# Patient Record
Sex: Female | Born: 1945 | Race: White | Hispanic: No | Marital: Married | Smoking: Never smoker
Health system: Southern US, Community
[De-identification: ages and names within clinical notes are randomized; demographics above are authoritative.]

## PROBLEM LIST (undated history)

## (undated) VITALS — BP 148/79 | HR 102 | Temp 98.1°F | Resp 18 | Ht 64.0 in | Wt 126.0 lb

## (undated) DIAGNOSIS — E7800 Pure hypercholesterolemia, unspecified: Secondary | ICD-10-CM

## (undated) DIAGNOSIS — E785 Hyperlipidemia, unspecified: Secondary | ICD-10-CM

## (undated) DIAGNOSIS — R011 Cardiac murmur, unspecified: Secondary | ICD-10-CM

## (undated) DIAGNOSIS — F329 Major depressive disorder, single episode, unspecified: Secondary | ICD-10-CM

## (undated) DIAGNOSIS — I10 Essential (primary) hypertension: Secondary | ICD-10-CM

## (undated) HISTORY — DX: Pure hypercholesterolemia, unspecified: E78.00

## (undated) HISTORY — DX: Cardiac murmur, unspecified: R01.1

## (undated) HISTORY — DX: Major depressive disorder, single episode, unspecified: F32.9

---

## 1978-12-11 HISTORY — PX: ABDOMINAL HYSTERECTOMY: SHX81

## 1998-12-11 DIAGNOSIS — F32A Depression, unspecified: Secondary | ICD-10-CM

## 1998-12-11 HISTORY — DX: Depression, unspecified: F32.A

## 2004-10-10 ENCOUNTER — Ambulatory Visit: Payer: Self-pay | Admitting: Internal Medicine

## 2005-12-07 ENCOUNTER — Ambulatory Visit: Payer: Self-pay | Admitting: Internal Medicine

## 2006-05-17 ENCOUNTER — Ambulatory Visit: Payer: Self-pay | Admitting: Unknown Physician Specialty

## 2007-01-24 ENCOUNTER — Ambulatory Visit: Payer: Self-pay | Admitting: Internal Medicine

## 2007-04-25 ENCOUNTER — Ambulatory Visit: Payer: Self-pay | Admitting: Gastroenterology

## 2008-01-28 ENCOUNTER — Ambulatory Visit: Payer: Self-pay | Admitting: Internal Medicine

## 2009-02-08 ENCOUNTER — Ambulatory Visit: Payer: Self-pay | Admitting: Internal Medicine

## 2010-02-14 ENCOUNTER — Ambulatory Visit: Payer: Self-pay | Admitting: Internal Medicine

## 2011-03-15 ENCOUNTER — Ambulatory Visit: Payer: Self-pay | Admitting: Internal Medicine

## 2012-04-03 ENCOUNTER — Ambulatory Visit: Payer: Self-pay | Admitting: Internal Medicine

## 2012-08-20 ENCOUNTER — Ambulatory Visit: Payer: Self-pay | Admitting: Gastroenterology

## 2012-09-12 ENCOUNTER — Telehealth: Payer: Self-pay | Admitting: Internal Medicine

## 2012-09-12 NOTE — Telephone Encounter (Signed)
Cell phone # [phone] Pt called stated she has a history of depression and she thinks she is going through this again She had a colonoscopy 9/10 since then she has not be feeling right Headaches no energy no interest. She wanted to know if you could recommend a therapist for her to go to She is being treated for sinus infection now

## 2012-09-12 NOTE — Telephone Encounter (Signed)
I refer to [facility]).  Let me know if I need to see her - can work her in next week.

## 2012-09-17 NOTE — Telephone Encounter (Signed)
Left message on machine at home for patient to return call. 

## 2012-09-24 NOTE — Telephone Encounter (Signed)
Called pt again on 09/24/12 to inform her of Dr. Roby Lofts referral to [employer]. Pt stated that she is already seeing a therapist that she contacted herself.

## 2012-10-07 ENCOUNTER — Telehealth: Payer: Self-pay | Admitting: Internal Medicine

## 2012-10-07 NOTE — Telephone Encounter (Signed)
Patient having depression ,headaches and anxiety and wants a sooner appointment.

## 2012-10-07 NOTE — Telephone Encounter (Signed)
Spoke with patient via telephone, she stated that she is already on medication for depression and anxiety but her main concern was the neck and shoulder pain.  She was ok the an appt on 10/09/2012, Erie Noe will schedule appt.

## 2012-10-07 NOTE — Telephone Encounter (Signed)
I can see her Wednesday 10/09/12 at 12:00 - work in for this.  Make sure pt comfortable with Wednesday appt.  Thanks.

## 2012-10-09 ENCOUNTER — Encounter: Payer: Self-pay | Admitting: Internal Medicine

## 2012-10-09 ENCOUNTER — Ambulatory Visit (INDEPENDENT_AMBULATORY_CARE_PROVIDER_SITE_OTHER): Payer: BC Managed Care – PPO | Admitting: Internal Medicine

## 2012-10-09 VITALS — BP 138/82 | HR 91 | Temp 97.8°F | Resp 18 | Ht 64.0 in | Wt 128.0 lb

## 2012-10-09 DIAGNOSIS — M542 Cervicalgia: Secondary | ICD-10-CM

## 2012-10-09 DIAGNOSIS — F329 Major depressive disorder, single episode, unspecified: Secondary | ICD-10-CM

## 2012-10-09 DIAGNOSIS — R5381 Other malaise: Secondary | ICD-10-CM

## 2012-10-09 DIAGNOSIS — R5383 Other fatigue: Secondary | ICD-10-CM

## 2012-10-09 DIAGNOSIS — E7800 Pure hypercholesterolemia, unspecified: Secondary | ICD-10-CM

## 2012-10-09 NOTE — Patient Instructions (Signed)
It was good seeing you today.  I am sorry you have not been feeling well.  I am going to refer you to physical therapy - for your neck.  Let me know if you need anything more.

## 2012-10-10 ENCOUNTER — Encounter: Payer: Self-pay | Admitting: Internal Medicine

## 2012-10-10 DIAGNOSIS — F329 Major depressive disorder, single episode, unspecified: Secondary | ICD-10-CM | POA: Insufficient documentation

## 2012-10-10 DIAGNOSIS — E7800 Pure hypercholesterolemia, unspecified: Secondary | ICD-10-CM | POA: Insufficient documentation

## 2012-10-10 DIAGNOSIS — F320 Major depressive disorder, single episode, mild: Secondary | ICD-10-CM | POA: Insufficient documentation

## 2012-10-10 NOTE — Assessment & Plan Note (Signed)
Back on Wellbutrin, Xanax and Ambien.  Sleeping better.  Energy has improved some.  No suicidal ideations. Continue to follow up with Dr Neale Burly.

## 2012-10-10 NOTE — Assessment & Plan Note (Signed)
On simvastatin.  Follow lipid panel and liver function.    

## 2012-10-10 NOTE — Progress Notes (Signed)
  Subjective:    Patient ID: Angelica Robertson, female    DOB: May 02, 1946, 66 y.o.   MRN: [id number]  HPI 66 year old female with past history of depression and hypercholesterolemia who comes in today as a work in with concerns regarding increased tension and neck discomfort.  States she had a colonoscopy 08/20/12.  One week after the colonoscopy, she developed headache and decreased energy.  She had been exercising regularly.  When this started, she noticed more fatigue and decreased appetite.  Hard to go to the grocery store or cook.  Lost interest in doing things.  Had problems with depression previously and stated she could recognized the symptoms.  She made herself go to the [facility] and exercise.  Subsequently developed increased tension in her neck which led to the headaches.  She experienced this previously - with the depression.  She contacted her psychiatrist (Dr Neale Burly) and started on Wellbutrin on 09/12/12.  Recently increased Xanax to bid dosing.  She also has ambien to help her sleep.  She does have more energy now.  Still having to make herself do things and is making herself eat.  She is sleeping better.  Still with the neck tension.    Past Medical History  Diagnosis Date  . Depression 2000  . Hypercholesterolemia     Review of Systems Patient denies any lightheadedness or dizziness.  Does report the headache - which she attributes to the muscle tension.  No sinus symptoms.  No chest pain, tightness or palpatations.  No increased shortness of breath, cough or congestion.  No nausea or vomiting.  No abdominal pain or cramping.  No bowel change, such as diarrhea, constipation, BRBPR or melana.  No urine change.        Objective:   Physical Exam Filed Vitals:   10/09/12 1157  BP: 138/82  Pulse: 91  Temp: 97.8 F (36.6 C)  Resp: 79   66 year old female in no acute distress.   HEENT:  Nares - clear.  OP- without lesions or erythema.  NECK:  Supple, nontender.  No audible bruit.  No  significant pain with rotation of her head.  Some minimal pulling sensation with looking to the left and right.   HEART:  Appears to be regular. LUNGS:  Without crackles or wheezing audible.  Respirations even and unlabored.   RADIAL PULSE:  Equal bilaterally.  ABDOMEN:  Soft, nontender.  No audible abdominal bruit.   EXTREMITIES:  No increased edema to be present.  No pain with rotation of her shoulders.                   Assessment & Plan:  NECK PAIN WITH ASSOCIATED HEADACHE.  She is sleeping better now.  Describes the pain/discomfort as being the same as she experienced before - with her depression.  Discussed at length with her today.  Will hold on xray and medications.  Will refer her to physical therapy for evaluation and treatment.  Follow.   FATIGUE.  Check cbc, met c and tsh to confirm no metabolic etiology.    HEALTH MAINTENANCE.  Mammogram - 03/2012.  Colonoscopy 08/20/12.

## 2012-10-15 ENCOUNTER — Ambulatory Visit (INDEPENDENT_AMBULATORY_CARE_PROVIDER_SITE_OTHER): Payer: BC Managed Care – PPO | Admitting: Internal Medicine

## 2012-10-15 ENCOUNTER — Encounter: Payer: Self-pay | Admitting: Internal Medicine

## 2012-10-15 ENCOUNTER — Telehealth: Payer: Self-pay | Admitting: Internal Medicine

## 2012-10-15 VITALS — BP 138/98 | HR 96 | Temp 97.8°F | Ht 64.0 in | Wt 126.0 lb

## 2012-10-15 DIAGNOSIS — F329 Major depressive disorder, single episode, unspecified: Secondary | ICD-10-CM

## 2012-10-15 DIAGNOSIS — R109 Unspecified abdominal pain: Secondary | ICD-10-CM

## 2012-10-15 DIAGNOSIS — E7800 Pure hypercholesterolemia, unspecified: Secondary | ICD-10-CM

## 2012-10-15 DIAGNOSIS — R0602 Shortness of breath: Secondary | ICD-10-CM

## 2012-10-15 DIAGNOSIS — R079 Chest pain, unspecified: Secondary | ICD-10-CM

## 2012-10-15 NOTE — Telephone Encounter (Signed)
Caller: [name]/Patient; Patient Name: Angelica Robertson; PCP: Dale Eugenio Saenz; Best Callback Phone Number: [phone].  Patient calling about pain under R breast which radiating into the back.  States occurred 10/14/12 2030, and lasted about 3 minutes.  States she had a similar episode 4 weeks ago.  Afebrile.  Per abdominal pain protocol, emergent symptoms denied; advised appt within 2 weeks.  Appt scheduled 10/15/12 1000 with Dr. Lorin Picket.

## 2012-10-15 NOTE — Patient Instructions (Addendum)
I am sorry you have not been feeling well.  We will schedule an abdominal ultrasound and stress test.  We will call you with an appt time.  Let me know if you have any problems.

## 2012-10-16 ENCOUNTER — Other Ambulatory Visit (INDEPENDENT_AMBULATORY_CARE_PROVIDER_SITE_OTHER): Payer: BC Managed Care – PPO

## 2012-10-16 DIAGNOSIS — R5383 Other fatigue: Secondary | ICD-10-CM

## 2012-10-16 DIAGNOSIS — E7800 Pure hypercholesterolemia, unspecified: Secondary | ICD-10-CM

## 2012-10-16 LAB — LIPID PANEL
Cholesterol: 167 mg/dL (ref 0–200)
Total CHOL/HDL Ratio: 3
Triglycerides: 140 mg/dL (ref 0.0–149.0)

## 2012-10-16 LAB — COMPREHENSIVE METABOLIC PANEL
BUN: 15 mg/dL (ref 6–23)
CO2: 31 mEq/L (ref 19–32)
Calcium: 10.1 mg/dL (ref 8.4–10.5)
Chloride: 100 mEq/L (ref 96–112)
Creatinine, Ser: 0.8 mg/dL (ref 0.4–1.2)
GFR: 78.98 mL/min (ref >60.00)
Glucose, Bld: 105 mg/dL — ABNORMAL HIGH (ref 70–99)

## 2012-10-16 LAB — CBC WITH DIFFERENTIAL/PLATELET
Basophils Relative: 0.5 % (ref 0.0–3.0)
HCT: 41.7 % (ref 36.0–46.0)
Hemoglobin: 13.8 g/dL (ref 12.0–15.0)
Lymphocytes Relative: 19.8 % (ref 12.0–46.0)
Lymphs Abs: 1.9 10*3/uL (ref 0.7–4.0)
MCHC: 33.1 g/dL (ref 30.0–36.0)
Monocytes Relative: 7.4 % (ref 3.0–12.0)
Neutro Abs: 6.7 10*3/uL (ref 1.4–7.7)
RBC: 4.57 Mil/uL (ref 3.87–5.11)

## 2012-10-16 LAB — TSH: TSH: 1.14 u[IU]/mL (ref 0.35–5.50)

## 2012-10-18 ENCOUNTER — Telehealth: Payer: Self-pay | Admitting: *Deleted

## 2012-10-20 NOTE — Progress Notes (Signed)
  Subjective:    Patient ID: Angelica Robertson, female    DOB: [birth year], 66 y.o.   MRN: [id number]  HPI 66 year old female with past history of depression and hypercholesterolemia who comes in today as a work in with concerns regarding epigastric and RUQ pain as well as back pain.  Describes a pressure/pulling sensation.  Had an episode 4-5 weeks ago - in bed.  Took TUMS.  Resolved.  With this episode, she had eaten approximately two hours prior.  Ate grilled chicken and green beans.  Episode lasted several minutes.  No bowel change.  Has not had another episode since last night.  No vomiting.  No acid reflux.  She has been having more trouble with depression lately.  See last note for details.  Recently had not been able to exercise as much as she previously could.  States she would get winded and feel more fatigued.  This has improved some, but she is still not able to exercise - "like she was".  No actual chest pain.    Past Medical History  Diagnosis Date  . Depression 2000  . Hypercholesterolemia      Review of Systems Patient denies any headache, lightheadedness or dizziness.  No chest pain, tightness or palpitations. Describes the fatigue and windedness with exercise - as outlined above.  No nausea or vomiting.  Abdominal pain as outlined.  No bowel change, such as diarrhea, constipation, BRBPR or melana.  No urine change.        Objective:   Physical Exam Filed Vitals:   10/15/12 1051  BP: 138/98  Pulse: 96  Temp: 97.8 F (36.6 C)   Blood pressure recheck:  26/52  66 year old female in no acute distress.   HEENT:  Nares - clear.  OP- without lesions or erythema.  NECK:  Supple, nontender.  No audible bruit.   HEART:  Appears to be regular. LUNGS:  Without crackles or wheezing audible.  Respirations even and unlabored.   RADIAL PULSE:  Equal bilaterally.  ABDOMEN:  Soft, nontender.  No audible abdominal bruit. \ BACK:  No back pain or CVA tenderness.    EXTREMITIES:  No increased  edema to be present.                     Assessment & Plan:  CARDIOVASCULAR.  EKG obtained today and revealed SR with no acute ischemic changes.  Given the windedness with exertion and increased fatigue with exercise and given risk factors - will obtain stress echo to confirm no active ischemia.  Continue risk factor modification.   ABDOMINAL PAIN.  Unclear as to the exact etiology.  Will obtain cbc, liver panel and amylase/lipase.  Check abdominal ultrasound.  Follow.  She will notify me or be reevaluated if symptoms change or worsen.    ELEVATED BLOOD PRESSURE.  Has been under good control.  Elevated today.  Follow.

## 2012-10-20 NOTE — Assessment & Plan Note (Signed)
Back on Wellbutrin and Remeron now.  Follow.  Continue to follow up with psychiatry.

## 2012-10-20 NOTE — Assessment & Plan Note (Signed)
On simvastatin.  Check lipid panel and liver function.   

## 2012-10-21 ENCOUNTER — Ambulatory Visit: Payer: Self-pay | Admitting: Internal Medicine

## 2012-10-23 NOTE — Telephone Encounter (Signed)
Patient notified of test results 

## 2012-10-25 ENCOUNTER — Telehealth: Payer: Self-pay | Admitting: Internal Medicine

## 2012-10-25 NOTE — Telephone Encounter (Signed)
Pt was notified 10/22/12 of her abdominal ultrasound results.  Small cysts in the left hepatic lobe but no other acute hepatic abnormality.  Gallbladder normal.  No common bile duct dilation.  Spleen, pancreas and kidneys are normal.

## 2012-10-25 NOTE — Telephone Encounter (Signed)
Received call from Dr Brita Romp (pts psychiatrist) -  [phone] office and [phone] cell) regarding pt.  Was inquiring about stress test.  Explained did not have results yet, but would notify her as soon as I received results.  Discussed Ms Eick.  She is in the process of adjusting her meds.  She has had some suicidal thoughts.  Dr Neale Burly does not feel she needs hospitalization at this time.  Is following closely.

## 2012-10-29 ENCOUNTER — Telehealth: Payer: Self-pay | Admitting: Internal Medicine

## 2012-10-29 NOTE — Telephone Encounter (Signed)
Please notify ms Shank that her stress test looks good.  Ok.

## 2012-10-29 NOTE — Telephone Encounter (Signed)
Called and gave lab results to patient.  

## 2012-11-11 ENCOUNTER — Encounter: Payer: Self-pay | Admitting: Internal Medicine

## 2012-11-12 ENCOUNTER — Encounter: Payer: Self-pay | Admitting: Internal Medicine

## 2012-11-12 ENCOUNTER — Ambulatory Visit (INDEPENDENT_AMBULATORY_CARE_PROVIDER_SITE_OTHER): Payer: BC Managed Care – PPO | Admitting: Internal Medicine

## 2012-11-12 VITALS — BP 149/90 | HR 107 | Temp 98.4°F | Ht 64.0 in | Wt 127.0 lb

## 2012-11-12 DIAGNOSIS — F329 Major depressive disorder, single episode, unspecified: Secondary | ICD-10-CM

## 2012-11-12 DIAGNOSIS — F3289 Other specified depressive episodes: Secondary | ICD-10-CM

## 2012-11-12 NOTE — Progress Notes (Signed)
Subjective:    Patient ID: Angelica Robertson, female    DOB: 02-20-1946, 66 y.o.   MRN: [id number]  HPI  66 year old female with past history of depression and hypercholesterolemia who comes in today as a work in with concerns regarding increased blood pressure and headache.  States she had a colonoscopy 08/20/12.  One week after the colonoscopy, she developed headache and decreased energy.  She has had problems since that time with increased depression.  Seeing Dr Neale Burly regularly.  She came in today to discuss her blood pressure and headaches.  Blood pressure readings have been averaging 128-139/70s.  She has neck issues and feels this is contributing to her headache.  She stopped going to physical therapy.  She is now seeing a [facility].  Headache is better now.  Still with decreased appetite, but she is making herself eat.  States she only sleeps six hours per night.  Due to see Dr Neale Burly this pm.  She does report having some increased chest pressure at times.  Also has noticed increased heart rate.  No significant sob.  No nausea or vomiting.       Past Medical History  Diagnosis Date  . Depression 2000  . Hypercholesterolemia     Review of Systems Patient denies any lightheadedness or dizziness.  Does report the headache - which she attributes to the muscle tension.  Better now.   No sinus symptoms.  Does report the increased heart rate intermittently (mostly 104-119 - when it occurs).   No increased shortness of breath, cough or congestion.  No nausea or vomiting.  Decreased appetite, but she is making herself eat.  No abdominal pain or cramping.  No bowel change, such as diarrhea, constipation, BRBPR or melana.  No urine change.   No longer exercising.      Objective:   Physical Exam  Filed Vitals:   11/12/12 0948  BP: 149/90  Pulse: 107  Temp: 98.4 F (36.9 C)   Blood pressure recheck:  68/72  66 year old female in no acute distress.   HEENT:  Nares - clear.  OP- without lesions  or erythema.  NECK:  Supple, nontender.  No audible bruit.  No significant pain with rotation of her head.  Better rom.  HEART:  Appears to be regular. LUNGS:  Without crackles or wheezing audible.  Respirations even and unlabored.   RADIAL PULSE:  Equal bilaterally.  ABDOMEN:  Soft, nontender.  No audible abdominal bruit.   EXTREMITIES:  No increased edema to be present.  No pain with rotation of her shoulders.                   Assessment & Plan:  NECK PAIN WITH ASSOCIATED HEADACHE.  Appears to be better.  Has Flexeril if needed.  Hold on any further testing at this time.    CARDIOVASCULAR.  Recent stress test negative for ischemia.  She is having intermittent chest tightness and increased heart rate.  Scares her when this happens.  We discussed her recent negative stress test.  Discussed the anxiety and how I felt this was contributing to her symptoms.  She is no longer exercising.  She is seeing psychiatry and meds are being adjusted - to obtain better control of her anxiety.  She is extremely anxious about this.  We discussed referral to cardiology to get another opinion regarding the need for further w/up.  I think this would help reassure her - if they agreed no further  cardiac w/up warranted.  She wants to hold on referral at this point, but will let me know if she changes her mind.    HEALTH MAINTENANCE.  Mammogram - 03/2012.  Colonoscopy 08/20/12.

## 2012-11-12 NOTE — Assessment & Plan Note (Addendum)
Seeing Dr Neale Burly regularly.  Medications are being adjusted.  She is due to see her this pm.  No suicidal ideations currently.  They have discussed the possibility of admission.  We discussed this today as well.  She will continue close follow up with Dr Neale Burly.  I spent over 25 minutes with the patient - with over 50% of the time spent in consultation regarding the above.

## 2012-11-13 ENCOUNTER — Telehealth: Payer: Self-pay | Admitting: Internal Medicine

## 2012-11-13 ENCOUNTER — Encounter: Payer: Self-pay | Admitting: Internal Medicine

## 2012-11-13 DIAGNOSIS — R079 Chest pain, unspecified: Secondary | ICD-10-CM

## 2012-11-13 NOTE — Telephone Encounter (Signed)
Pt has an apt. On 12/6 @ 945am. Pt is aware.

## 2012-11-13 NOTE — Telephone Encounter (Signed)
Pt needs referral to cardiology for evaluation of intermittent chest pain and tightness.  Thanks.

## 2012-11-13 NOTE — Telephone Encounter (Signed)
Pt is calling and saying she would like to see a cardiologist.

## 2012-11-15 ENCOUNTER — Ambulatory Visit (INDEPENDENT_AMBULATORY_CARE_PROVIDER_SITE_OTHER): Payer: BC Managed Care – PPO | Admitting: Cardiovascular Disease

## 2012-11-15 ENCOUNTER — Encounter: Payer: Self-pay | Admitting: Cardiovascular Disease

## 2012-11-15 VITALS — BP 154/92 | HR 102 | Ht 63.0 in | Wt 121.5 lb

## 2012-11-15 DIAGNOSIS — F329 Major depressive disorder, single episode, unspecified: Secondary | ICD-10-CM

## 2012-11-15 DIAGNOSIS — E7800 Pure hypercholesterolemia, unspecified: Secondary | ICD-10-CM

## 2012-11-15 DIAGNOSIS — R0789 Other chest pain: Secondary | ICD-10-CM

## 2012-11-15 DIAGNOSIS — I498 Other specified cardiac arrhythmias: Secondary | ICD-10-CM

## 2012-11-15 DIAGNOSIS — R079 Chest pain, unspecified: Secondary | ICD-10-CM

## 2012-11-15 DIAGNOSIS — R000 Tachycardia, unspecified: Secondary | ICD-10-CM

## 2012-11-15 NOTE — Assessment & Plan Note (Signed)
Chest pain is atypical in nature. Given previous exercise tolerance, recent negative stress test, no further workup needed. We have tried to reassure her. She has no significant risk factors for coronary artery disease. Cholesterol is well controlled.

## 2012-11-15 NOTE — Assessment & Plan Note (Signed)
Complemented her on her excellent cholesterol level and suggested she stay on her cholesterol medication.

## 2012-11-15 NOTE — Assessment & Plan Note (Signed)
Sinus tachycardia noted during her stress test and on today's visit. Possibly secondary to anxiety. She does have palpitations and baseline fast heart rate on her self-evaluation at home. We have suggested she try bystolic 5 mg daily in the morning, possibly with slow titration up to 10 mg daily for symptom relief from tachycardia and palpitations. I suspect her anxiety/depression could be contributing to her tachycardia. Perhaps when this improves, she can stop the beta blocker. We did mention that she could take a beta blocker on a when necessary basis as well.

## 2012-11-15 NOTE — Assessment & Plan Note (Signed)
Be managed by Dr. Lorin Picket and Dr. Neale Burly at [facility].

## 2012-11-15 NOTE — Patient Instructions (Addendum)
Please start bystolic 5 mg once a day (ok to take as needed) For worsening symptoms, take two Call the office if symptoms persist   Please call us if you have new issues that need to be addressed before your next appt.   One month follow up

## 2012-11-15 NOTE — Progress Notes (Signed)
Patient ID: Angelica Robertson, female    DOB: 1946-08-10, 66 y.o.   MRN: [id number]  HPI Comments: Ms. Angelica Robertson is a very pleasant 66 year old woman with previous history of depression, patient of Dr. Lorin Picket, who presents by referral for symptoms of chest discomfort.   She reports that she had a recent stress test, echo stress test where she achieved heart rate 158 beats per minute, no wall motion abnormality by her report. She was told her stress test was normal. Details of the study were obtained. Stress test 10/24/2012. She exercised for close to 10 minutes, achieved close to 11 mets, peak heart rate 169 beats per minute, baseline heart rate 104 beats per minute. Peak blood pressure 187/94.  She used to work out at [employer] 4 days per week. Since the middle of September, she has felt very depressed and anxious. She has quit the [facility]. She sees psychiatry at [facility], Dr. Neale Burly and was started on cervical in the past month. She has not had any significant improvement in her symptoms.  She reports rare episodes of chest pain, possibly 3-4 episodes over the past several months. She is otherwise able to exert herself without any symptoms. She did have one episode of pressure in her abdomen after eating. She reports that her heart rate is typically very fast. She does appreciate palpitations.  EKG today shows sinus tachycardia with rate 102 beats per minute with no significant ST or T wave changes     Outpatient Encounter Prescriptions as of 11/15/2012  Medication Sig Dispense Refill  . ALPRAZolam (XANAX) 0.5 MG tablet Take 0.5 mg by mouth 2 (two) times daily as needed.       Marland Kitchen buPROPion (WELLBUTRIN XL) 300 MG 24 hr tablet Take 300 mg by mouth daily.      . cyclobenzaprine (FLEXERIL) 10 MG tablet Take 10 mg by mouth as needed.      Marland Kitchen QUEtiapine (SEROQUEL) 25 MG tablet Take 25 mg by mouth at bedtime.      . ranitidine (ZANTAC) 150 MG tablet Take 150 mg by mouth 2 (two) times daily.      . simvastatin  (ZOCOR) 20 MG tablet Take 20 mg by mouth daily.      Marland Kitchen zolpidem (AMBIEN CR) 12.5 MG CR tablet Take 12.5 mg by mouth at bedtime as needed.      . [DISCONTINUED] buPROPion (WELLBUTRIN SR) 200 MG 12 hr tablet Take 200 mg by mouth daily.      . [DISCONTINUED] mirtazapine (REMERON) 15 MG tablet Take 15 mg by mouth at bedtime.         Review of Systems  Constitutional: Negative.   HENT: Negative.   Eyes: Negative.   Respiratory: Negative.   Cardiovascular: Positive for palpitations.  Gastrointestinal: Negative.   Musculoskeletal: Negative.   Skin: Negative.   Neurological: Negative.   Hematological: Negative.   Psychiatric/Behavioral: Positive for dysphoric mood and decreased concentration.  All other systems reviewed and are negative.    BP 154/92  Pulse 102  Ht 5\' 3"  (1.6 m)  Wt 121 lb 8 oz (55.112 kg)  BMI 21.52 kg/m2  Physical Exam  Nursing note and vitals reviewed. Constitutional: She is oriented to person, place, and time. She appears well-developed and well-nourished.  HENT:  Head: Normocephalic.  Nose: Nose normal.  Mouth/Throat: Oropharynx is clear and moist.  Eyes: Conjunctivae normal are normal. Pupils are equal, round, and reactive to light.  Neck: Normal range of motion. Neck supple. No JVD  present.  Cardiovascular: Regular rhythm, S1 normal, S2 normal, normal heart sounds and intact distal pulses.  Tachycardia present.  Exam reveals no gallop and no friction rub.   No murmur heard. Pulmonary/Chest: Effort normal and breath sounds normal. No respiratory distress. She has no wheezes. She has no rales. She exhibits no tenderness.  Abdominal: Soft. Bowel sounds are normal. She exhibits no distension. There is no tenderness.  Musculoskeletal: Normal range of motion. She exhibits no edema and no tenderness.  Lymphadenopathy:    She has no cervical adenopathy.  Neurological: She is alert and oriented to person, place, and time. Coordination normal.  Skin: Skin is warm  and dry. No rash noted. No erythema.  Psychiatric: She has a normal mood and affect. Her behavior is normal. Judgment and thought content normal.         Assessment and Plan

## 2012-11-19 ENCOUNTER — Ambulatory Visit: Payer: Self-pay | Admitting: Internal Medicine

## 2012-11-26 ENCOUNTER — Ambulatory Visit (INDEPENDENT_AMBULATORY_CARE_PROVIDER_SITE_OTHER): Payer: BC Managed Care – PPO | Admitting: Internal Medicine

## 2012-11-26 ENCOUNTER — Encounter: Payer: Self-pay | Admitting: Internal Medicine

## 2012-11-26 VITALS — BP 148/80 | HR 92 | Temp 98.2°F | Ht 64.0 in | Wt 126.5 lb

## 2012-11-26 DIAGNOSIS — F329 Major depressive disorder, single episode, unspecified: Secondary | ICD-10-CM

## 2012-11-26 DIAGNOSIS — F3289 Other specified depressive episodes: Secondary | ICD-10-CM

## 2012-11-26 MED ORDER — PANTOPRAZOLE SODIUM 40 MG PO TBEC: 40.0000 mg | DELAYED_RELEASE_TABLET | Freq: Every day | ORAL | Status: DC

## 2012-12-01 ENCOUNTER — Encounter: Payer: Self-pay | Admitting: Internal Medicine

## 2012-12-01 NOTE — Progress Notes (Signed)
Subjective:    Patient ID: Angelica Robertson, female    DOB: [birth year], 66 y.o.   MRN: [id number]  Sore Throat   66 year old female with past history of depression and hypercholesterolemia who comes in today as a work in with concerns regarding some increased gi issues.  States she had a colonoscopy 08/20/12.  One week after the colonoscopy, she developed headache and decreased energy.  She has had problems since that time with increased depression.  Seeing Dr Neale Burly.  No significant sob.  No nausea or vomiting.  States she feels full.  Minimal acid reflux.  Was taking Zantac, but she stopped the last couple of days.  Feels bloated.  Bowel movements - not regular.  No abdominal pain.  She is forcing herself to eat.  No dysphagia.    Past Medical History  Diagnosis Date  . Depression 2000  . Hypercholesterolemia   . Heart murmur     Current Outpatient Prescriptions on File Prior to Visit  Medication Sig Dispense Refill  . ALPRAZolam (XANAX) 0.5 MG tablet Take 0.5 mg by mouth 2 (two) times daily as needed.       Marland Kitchen buPROPion (WELLBUTRIN XL) 300 MG 24 hr tablet Take 300 mg by mouth daily.      . cyclobenzaprine (FLEXERIL) 10 MG tablet Take 10 mg by mouth as needed.      Marland Kitchen QUEtiapine (SEROQUEL) 25 MG tablet Take 25 mg by mouth at bedtime.      . ranitidine (ZANTAC) 150 MG tablet Take 150 mg by mouth 2 (two) times daily.      . simvastatin (ZOCOR) 20 MG tablet Take 20 mg by mouth daily.      Marland Kitchen zolpidem (AMBIEN CR) 12.5 MG CR tablet Take 12.5 mg by mouth at bedtime as needed.      . pantoprazole (PROTONIX) 40 MG tablet Take 1 tablet (40 mg total) by mouth daily.  30 tablet  2    Review of Systems Patient denies any lightheadedness or dizziness.  Does report the headache - which she attributes to the muscle tension.  Better now.   No sinus symptoms.  No increased shortness of breath, cough or congestion.  Increased acid reflux (minimal).  No nausea or vomiting.  Decreased appetite, but she is making  herself eat.  No abdominal pain or cramping.  Some bloating.  No bowel change, such as diarrhea, BRBPR or melana.  She does report her bowels not as regular.  No urine change.       Objective:   Physical Exam  Filed Vitals:   11/26/12 1446  BP: 148/80  Pulse: 92  Temp: 98.2 F (45.37 C)   66 year old female in no acute distress.   HEENT:  Nares - clear.  OP- without lesions or erythema.  NECK:  Supple, nontender.  No audible bruit.  HEART:  Appears to be regular. LUNGS:  Without crackles or wheezing audible.  Respirations even and unlabored.   RADIAL PULSE:  Equal bilaterally.  ABDOMEN:  Soft, nontender.  No audible abdominal bruit.   EXTREMITIES:  No increased edema to be present.                   Assessment & Plan:  GI.  Symptoms as outlined.  Will start her on Protonix.  Follow closely.  Discussed pelvic ultrasound.  She declines.  Will follow.  Get her back in soon to reassess.    CARDIOVASCULAR.  Recent stress test negative  for ischemia.  Symptoms stable now.  Saw cardiology.  Recommended no further w/up.  Follow.    HEALTH MAINTENANCE.  Mammogram - 03/2012.  Colonoscopy 08/20/12.

## 2012-12-01 NOTE — Assessment & Plan Note (Signed)
Still seeing Dr Neale Burly.  Symptoms improved.  Follow.

## 2012-12-09 ENCOUNTER — Encounter: Payer: Self-pay | Admitting: Internal Medicine

## 2012-12-09 ENCOUNTER — Inpatient Hospital Stay (HOSPITAL_COMMUNITY)
Admission: EM | Admit: 2012-12-09 | Discharge: 2012-12-16 | DRG: 430 | Disposition: A | Discharge status: Home / Self Care | Payer: BC Managed Care – PPO | Source: Ambulatory Visit | Attending: Psychiatry | Admitting: Psychiatry

## 2012-12-09 ENCOUNTER — Telehealth: Payer: Self-pay | Admitting: Internal Medicine

## 2012-12-09 ENCOUNTER — Encounter (HOSPITAL_COMMUNITY): Payer: Self-pay | Admitting: *Deleted

## 2012-12-09 DIAGNOSIS — F419 Anxiety disorder, unspecified: Secondary | ICD-10-CM

## 2012-12-09 DIAGNOSIS — R0789 Other chest pain: Secondary | ICD-10-CM

## 2012-12-09 DIAGNOSIS — F411 Generalized anxiety disorder: Secondary | ICD-10-CM | POA: Diagnosis present

## 2012-12-09 DIAGNOSIS — E7800 Pure hypercholesterolemia, unspecified: Secondary | ICD-10-CM

## 2012-12-09 DIAGNOSIS — Z79899 Other long term (current) drug therapy: Secondary | ICD-10-CM

## 2012-12-09 DIAGNOSIS — F332 Major depressive disorder, recurrent severe without psychotic features: Principal | ICD-10-CM | POA: Diagnosis present

## 2012-12-09 DIAGNOSIS — R000 Tachycardia, unspecified: Secondary | ICD-10-CM

## 2012-12-09 DIAGNOSIS — F329 Major depressive disorder, single episode, unspecified: Secondary | ICD-10-CM

## 2012-12-09 MED ORDER — BUPROPION HCL ER (XL) 300 MG PO TB24
300.0000 mg | ORAL_TABLET | Freq: Every day | ORAL | Status: DC
  Administered 2012-12-10: 300 mg via ORAL
  Filled 2012-12-09 (×2): qty 1

## 2012-12-09 MED ORDER — ADULT MULTIVITAMIN W/MINERALS CH
1.0000 | ORAL_TABLET | Freq: Every day | ORAL | Status: DC
Start: 2012-12-10 — End: 2012-12-10
  Administered 2012-12-10: 1 via ORAL
  Filled 2012-12-09 (×3): qty 1

## 2012-12-09 MED ORDER — SIMVASTATIN 20 MG PO TABS
20.0000 mg | ORAL_TABLET | Freq: Every day | ORAL | Status: DC
  Administered 2012-12-10 – 2012-12-15 (×6): 20 mg via ORAL
  Filled 2012-12-09 (×8): qty 1

## 2012-12-09 MED ORDER — CYCLOBENZAPRINE HCL 10 MG PO TABS
10.0000 mg | ORAL_TABLET | Freq: Three times a day (TID) | ORAL | Status: DC | PRN
  Administered 2012-12-14: 10 mg via ORAL
  Filled 2012-12-09: qty 1

## 2012-12-09 MED ORDER — MAGNESIUM HYDROXIDE 400 MG/5ML PO SUSP: 30.0000 mL | Freq: Every day | ORAL | Status: DC | PRN

## 2012-12-09 MED ORDER — PANTOPRAZOLE SODIUM 40 MG PO TBEC
40.0000 mg | DELAYED_RELEASE_TABLET | Freq: Every day | ORAL | Status: DC
  Administered 2012-12-10: 40 mg via ORAL
  Filled 2012-12-09 (×4): qty 1

## 2012-12-09 MED ORDER — LORAZEPAM 1 MG PO TABS
1.0000 mg | ORAL_TABLET | Freq: Once | ORAL | Status: AC
  Administered 2012-12-10: 1 mg via ORAL

## 2012-12-09 MED ORDER — ZOLPIDEM TARTRATE 5 MG PO TABS
5.0000 mg | ORAL_TABLET | Freq: Every evening | ORAL | Status: DC | PRN
  Administered 2012-12-09: 5 mg via ORAL
  Filled 2012-12-09: qty 1

## 2012-12-09 MED ORDER — ALUM & MAG HYDROXIDE-SIMETH 200-200-20 MG/5ML PO SUSP: 30.0000 mL | ORAL | Status: DC | PRN

## 2012-12-09 MED ORDER — CALCIUM CARBONATE-VITAMIN D 500-200 MG-UNIT PO TABS
1.0000 | ORAL_TABLET | Freq: Every day | ORAL | Status: DC
  Administered 2012-12-09 – 2012-12-10 (×2): 1 via ORAL
  Filled 2012-12-09 (×5): qty 1

## 2012-12-09 MED ORDER — ACETAMINOPHEN 325 MG PO TABS
650.0000 mg | ORAL_TABLET | Freq: Four times a day (QID) | ORAL | Status: DC | PRN
  Administered 2012-12-12 – 2012-12-14 (×2): 650 mg via ORAL

## 2012-12-09 NOTE — Telephone Encounter (Signed)
Agree with ER evaluation.  She also needs to let her psychiatrist know.  Keep [place] posted
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
## 2012-12-09 NOTE — Telephone Encounter (Signed)
Son, Angelica Robertson calling.  She continues to be very depressed and would like to be admitted.  Asking how this needs to be done.   Advised to take her to the ED at [facility] for evaluation and they can contact office for any medical info and talk with Dr. Lorin Picket.

## 2012-12-09 NOTE — Telephone Encounter (Signed)
Left message to return call 

## 2012-12-09 NOTE — Tx Team (Signed)
Initial Interdisciplinary Treatment Plan  PATIENT STRENGTHS: (choose at least two) Average or above average intelligence Capable of independent living Communication skills Financial means General fund of knowledge Religious Affiliation Supportive family/friends  PATIENT STRESSORS: Health problems   PROBLEM LIST: Problem List/Patient Goals Date to be addressed Date deferred Reason deferred Estimated date of resolution  SI      Depression      Hypercholesterolemia      Osteoporosis      Heart Mummur                               DISCHARGE CRITERIA:  Improved stabilization in mood, thinking, and/or behavior Medical problems require only outpatient monitoring Motivation to continue treatment in a less acute level of care Reduction of life-threatening or endangering symptoms to within safe limits  PRELIMINARY DISCHARGE PLAN: Outpatient therapy Participate in family therapy Return to previous living arrangement  PATIENT/FAMIILY INVOLVEMENT: This treatment plan has been presented to and reviewed with the patient, Angelica Robertson, and/or family member.  The patient and family have been given the opportunity to ask questions and make suggestions.  Angelica Robertson 12/09/2012, 8:57 PM

## 2012-12-10 DIAGNOSIS — F411 Generalized anxiety disorder: Secondary | ICD-10-CM

## 2012-12-10 DIAGNOSIS — F329 Major depressive disorder, single episode, unspecified: Secondary | ICD-10-CM

## 2012-12-10 LAB — COMPREHENSIVE METABOLIC PANEL
ALT: 21 U/L (ref 0–35)
AST: 20 U/L (ref 0–37)
CO2: 29 mEq/L (ref 19–32)
Calcium: 9.5 mg/dL (ref 8.4–10.5)
Creatinine, Ser: 0.67 mg/dL (ref 0.50–1.10)
GFR calc Af Amer: >90 mL/min (ref >90)
GFR calc non Af Amer: >90 mL/min (ref >90)
Glucose, Bld: 114 mg/dL — ABNORMAL HIGH (ref 70–99)
Sodium: 133 mEq/L — ABNORMAL LOW (ref 135–145)
Total Protein: 6.6 g/dL (ref 6.0–8.3)

## 2012-12-10 LAB — CBC
MCH: 30.3 pg (ref 26.0–34.0)
MCHC: 34.6 g/dL (ref 30.0–36.0)
MCV: 87.5 fL (ref 78.0–100.0)
Platelets: 359 10*3/uL (ref 150–400)

## 2012-12-10 LAB — URINALYSIS, ROUTINE W REFLEX MICROSCOPIC
Bilirubin Urine: NEGATIVE
Glucose, UA: NEGATIVE mg/dL
Hgb urine dipstick: NEGATIVE
Hgb urine dipstick: NEGATIVE
Ketones, ur: NEGATIVE mg/dL
Leukocytes, UA: NEGATIVE
Protein, ur: NEGATIVE mg/dL
Protein, ur: NEGATIVE mg/dL
Specific Gravity, Urine: 1.008 (ref 1.005–1.030)
Urobilinogen, UA: 0.2 mg/dL (ref 0.0–1.0)
pH: 6.5 (ref 5.0–8.0)

## 2012-12-10 LAB — RAPID URINE DRUG SCREEN, HOSP PERFORMED
Cocaine: NOT DETECTED
Opiates: NOT DETECTED

## 2012-12-10 LAB — URINE MICROSCOPIC-ADD ON

## 2012-12-10 MED ORDER — BUPROPION HCL ER (XL) 150 MG PO TB24
150.0000 mg | ORAL_TABLET | Freq: Every day | ORAL | Status: DC
  Administered 2012-12-11 – 2012-12-13 (×3): 150 mg via ORAL
  Filled 2012-12-10 (×4): qty 1

## 2012-12-10 MED ORDER — CALCIUM CARBONATE-VITAMIN D 500-200 MG-UNIT PO TABS
1.0000 | ORAL_TABLET | Freq: Every day | ORAL | Status: DC
  Administered 2012-12-11 – 2012-12-16 (×6): 1 via ORAL
  Filled 2012-12-10 (×8): qty 1

## 2012-12-10 MED ORDER — ALPRAZOLAM 0.5 MG PO TABS
0.5000 mg | ORAL_TABLET | Freq: Two times a day (BID) | ORAL | Status: DC | PRN
  Administered 2012-12-10 – 2012-12-11 (×3): 0.5 mg via ORAL
  Filled 2012-12-10 (×3): qty 1

## 2012-12-10 MED ORDER — TRAZODONE HCL 50 MG PO TABS
50.0000 mg | ORAL_TABLET | Freq: Every day | ORAL | Status: DC
  Administered 2012-12-10 – 2012-12-13 (×4): 50 mg via ORAL
  Filled 2012-12-10 (×6): qty 1

## 2012-12-10 MED ORDER — LORAZEPAM 1 MG PO TABS
ORAL_TABLET | ORAL | Status: AC
  Filled 2012-12-10: qty 1

## 2012-12-10 MED ORDER — BUPROPION HCL ER (XL) 150 MG PO TB24
150.0000 mg | ORAL_TABLET | Freq: Every day | ORAL | Status: DC
  Filled 2012-12-10 (×2): qty 1

## 2012-12-10 MED ORDER — SERTRALINE HCL 25 MG PO TABS
25.0000 mg | ORAL_TABLET | Freq: Every day | ORAL | Status: DC
  Administered 2012-12-10 – 2012-12-12 (×3): 25 mg via ORAL
  Filled 2012-12-10 (×4): qty 1

## 2012-12-10 MED ORDER — ENSURE COMPLETE PO LIQD
237.0000 mL | Freq: Three times a day (TID) | ORAL | Status: DC
  Administered 2012-12-10 – 2012-12-16 (×13): 237 mL via ORAL

## 2012-12-10 MED ORDER — ADULT MULTIVITAMIN W/MINERALS CH
1.0000 | ORAL_TABLET | Freq: Every day | ORAL | Status: DC
  Administered 2012-12-11 – 2012-12-16 (×6): 1 via ORAL
  Filled 2012-12-10 (×8): qty 1

## 2012-12-10 MED ORDER — PANTOPRAZOLE SODIUM 40 MG PO TBEC
40.0000 mg | DELAYED_RELEASE_TABLET | Freq: Every day | ORAL | Status: DC
  Administered 2012-12-11 – 2012-12-16 (×6): 40 mg via ORAL
  Filled 2012-12-10 (×8): qty 1

## 2012-12-10 NOTE — Progress Notes (Signed)
Nutrition Brief Note  Patient identified on the Malnutrition Screening Tool (MST) Report  Body mass index is 21.63 kg/(m^2). Pt meets criteria for wnl based on current BMI.   Current diet order is Regular. Labs and medications reviewed.   RD unable to meet with pt today.  Pt admitted for increased depression.  Pt with significant recent wt loss of 20-25 lbs in 3 months (16% usual wt in 3 months) due to poor appetite.  Pt currently inactive. Pt also reported difficulty swallowing and MD note mentions pt with fixation r/t appetite and swallowing.  If swallowing is affecting intake or lead to coughing/choking with meals, recommend SLP evaluation be completed for pt.  Will order Ensure Complete TID until appetite/intake improves.  If nutrition issues arise, please consult RD. Will re-attempt to meet with pt as able.  Loyce Dys, MS RD LDN Clinical Inpatient Dietitian Pager: [phone] Weekend/After hours pager: [phone]

## 2012-12-10 NOTE — Social Work (Signed)
[facility] LCSW Aftercare Discharge Planning Group Note  12/10/2012  8:45 AM  Participation Quality:  Appropriate, Attentive, Sharing and Supportive  Affect:  Anxious  Cognitive:  Appropriate  Insight:  Engaged  Engagement in Group:  Engaged  Modes of Intervention:  Discussion, Education and Support  Summary of Progress/Problems: Pt attended discharge planning group and actively participated in group.  CSW provided pt with today's workbook. Patient says she is doing better this morning and she was last night but says she did not sleep well. Patient reports her roommate had a sitter and this required the door to be open with patient saying she cannot sleep with the light in the room. Patient denied having any suicidal ideation and appeared very anxious and concerned about her lack of appetite. Patient stated after her colonoscopy, she started losing weight and energy and began to isolate herself. Patient rated herself a 45 for depression, 0 for helplessness and hopelessness, and an 8 for anxiety due to hospital staff changing around her medication schedule.     [facility] LCSW Group Therapy  12/10/2012 1:15 PM  Type of Therapy:  Group Therapy  Participation Level:  Active  Participation Quality:  Appropriate, Attentive, Sharing and Supportive  Affect:  Anxious  Cognitive:  Appropriate  Insight:  Improving  Engagement in Therapy:  Engaged  Modes of Intervention:  Discussion, Education, Socialization and Support  Summary of Progress/Problems: Patient participated well in group discussion focused on feelings about diagnoses. Patient stated "it's hard for people to understand what it feels like to be depressed and anxious unless they've been there themselves. Mental illness is an unseen illness that people don't seem to understand because you can look fine and feel terrible." Patient reports her family is supportive and was able to list 3 sometimes when she is decompensating including being  indecisive, being reluctant to go into a grocery store, and no interest in social activities.  Patton Salles LCSW 12/10/2012 6:55 AM

## 2012-12-10 NOTE — Progress Notes (Signed)
[facility] Group Notes:  (Counselor/Nursing/MHT/Case Management/Adjunct)  12/10/2012 8:10PM  Type of Therapy:  Psychoeducational Skills  Participation Level:  Active  Participation Quality:  Appropriate  Affect:  Appropriate  Cognitive:  Appropriate  Insight:  Engaged  Engagement in Group:  Engaged  Engagement in Therapy:  Engaged  Modes of Intervention:  Wrap-Up Group  Summary of Progress/Problems: Pt said that she enjoyed her visits with her husband, son and daughter today. Pt said that her visitors could tell that she is improving since she has been here at [facility]. Pt said that she communicated with her family more today than she has in weeks. Pt said that before coming to [facility], she was isolating herself. Pt said that she now feels better about herself. Pt said that she wants to improve her appetite. Pt said that a change in her medications will hopefully help her appetite improve  Ladine Kiper K 12/10/2012, 9:23 PM

## 2012-12-10 NOTE — Progress Notes (Signed)
D: Patient denies SI/HI and A/V hallucinations; patient reports sleep to was poor due to roommate having 1:1 observation ; reports appetite to be poor ; reports energy level is low ; reports ability to pay attention to be good; rates depression as 5/10; rates hopelessness 3/10; rates anxiety as 4/10;   A: Monitored q 15 minutes; patient encouraged to attend groups; patient educated about medications; patient given medications per physician orders; patient encouraged to express feelings and/or concerns  R: Patient is anxious and very preoccupied with medication schedule and is very particular about her regimen.; patient's interaction with staff and peers is appropriate; patient was able to set goal to talk with staff 1:1 when having feelings of SI; patient is taking medications as prescribed; patient is attending all groups
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
## 2012-12-10 NOTE — Progress Notes (Signed)
Psychoeducational Group Note  Date:  12/10/2012 Time:  1100hrs  Group Topic/Focus:  Therapeutic Activity: Question Newman Pies  Participation Level:  Did Not Attend  Participation Quality:    Affect:    Cognitive:    Insight:    Engagement in Group:    Additional Comments:   Barton Fanny 12/10/2012, 3:38 PM
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
## 2012-12-10 NOTE — Telephone Encounter (Signed)
Husband called back states that they did take patient to ED at [facility] and they evaluated her and she will be in the facility for at least 7 days.  He said that he would keep [place] posted.

## 2012-12-10 NOTE — Progress Notes (Signed)
D: Pt is still anxious but feels that she is improving.Affect somewhat constricted. A: Pt was given ensure as prescribed & pt stated that she would try to drink it.A: Pt supported & encouraged.Continues on 15 minute checks. R: Pt safety maintained.
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
## 2012-12-10 NOTE — Progress Notes (Signed)
Psychoeducational Group Note  Date:  12/10/2012 Time:  1000  Group Topic/Focus:  Coping Skills Pictionary Participation Level:  Active  Participation Quality:  Appropriate, Attentive and Sharing  Affect:  Appropriate  Cognitive:  Alert and Appropriate  Insight:  Engaged  Engagement in Group:  Engaged  Additional Comments:  Pt was appropriate and attentive while attending group. Pt was willing to draw a coping skill on the board for her team.   Sharyn Lull 12/10/2012, 10:55 AM
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
## 2012-12-11 DIAGNOSIS — F411 Generalized anxiety disorder: Secondary | ICD-10-CM

## 2012-12-11 LAB — URINE CULTURE: Colony Count: 9000

## 2012-12-11 MED ORDER — ONDANSETRON 4 MG PO TBDP
4.0000 mg | ORAL_TABLET | Freq: Three times a day (TID) | ORAL | Status: DC | PRN
  Administered 2012-12-12 – 2012-12-13 (×2): 4 mg via ORAL

## 2012-12-11 MED ORDER — ALPRAZOLAM 0.25 MG PO TABS
0.2500 mg | ORAL_TABLET | Freq: Two times a day (BID) | ORAL | Status: DC | PRN
  Administered 2012-12-12 – 2012-12-13 (×2): 0.25 mg via ORAL
  Filled 2012-12-11 (×2): qty 1

## 2012-12-11 MED ORDER — HYDROXYZINE HCL 25 MG PO TABS
25.0000 mg | ORAL_TABLET | ORAL | Status: DC | PRN
  Administered 2012-12-14: 25 mg via ORAL

## 2012-12-11 MED ORDER — ONDANSETRON HCL 4 MG PO TABS
4.0000 mg | ORAL_TABLET | ORAL | Status: AC
  Administered 2012-12-11: 4 mg via ORAL

## 2012-12-11 NOTE — Progress Notes (Signed)
[facility] MD Progress Note  12/11/2012 1:46 PM Angelica Robertson  MRN:  [id number] Subjective:  4/10 anxiety Diagnosis:   Axis I: Anxiety Disorder NOS and Depressive Disorder NOS Axis II: Deferred Axis III:  Past Medical History  Diagnosis Date  . Depression 2000  . Hypercholesterolemia Osteoporosis  . Heart murmur    Axis IV: other psychosocial or environmental problems, problems related to social environment and problems with primary support group Axis V: 41-50 serious symptoms  ADL's:  Intact  Sleep: Poor  Appetite:  Fair  Suicidal Ideation:  Denies Homicidal Ideation:  Denies  Psychiatric Specialty Exam: Review of Systems  Constitutional: Negative.   HENT: Negative.   Eyes: Negative.   Respiratory: Negative.   Cardiovascular: Negative.   Gastrointestinal: Negative.   Genitourinary: Negative.   Musculoskeletal: Negative.   Skin: Negative.   Neurological: Negative.   Endo/Heme/Allergies: Negative.   Psychiatric/Behavioral: The patient is nervous/anxious and has insomnia.     Blood pressure 136/89, pulse 91, temperature 98.1 F (36.7 C), temperature source Oral, resp. rate 18, height 5\' 4"  (1.626 m), weight 57.153 kg (126 lb), last menstrual period 11/27/1979.Body mass index is 21.63 kg/(m^2).  General Appearance: Casual  Eye Contact::  Fair  Speech:  Normal Rate  Volume:  Normal  Mood:  Anxious and Depressed  Affect:  Congruent  Thought Process:  Coherent  Orientation:  Full (Time, Place, and Person)  Thought Content:  WDL  Suicidal Thoughts:  No  Homicidal Thoughts:  No  Memory:  Immediate;   Fair Recent;   Fair Remote;   Fair  Judgement:  Fair  Insight:  Fair  Psychomotor Activity:  Decreased  Concentration:  Fair  Recall:  Fair  Akathisia:  No  Handed:  Right  AIMS (if indicated):     Assets:  Communication Skills Physical Health Resilience Social Support  Sleep:  Number of Hours: 3.5    Current Medications: Current Facility-Administered Medications    Medication Dose Route Frequency Provider Last Rate Last Dose  . acetaminophen (TYLENOL) tablet 650 mg  650 mg Oral Q6H PRN [name], MD      . ALPRAZolam Prudy Feeler) tablet 0.25 mg  0.25 mg Oral BID PRN [name], NP      . alum & mag hydroxide-simeth (MAALOX/MYLANTA) 200-200-20 MG/5ML suspension 30 mL  30 mL Oral Q4H PRN [name], MD      . buPROPion (WELLBUTRIN XL) 24 hr tablet 150 mg  150 mg Oral Daily Himabindu Ravi, MD   150 mg at 12/11/12 0826  . calcium-vitamin D (OSCAL WITH D) 500-200 MG-UNIT per tablet 1 tablet  1 tablet Oral Daily Himabindu Ravi, MD   1 tablet at 12/11/12 0826  . cyclobenzaprine (FLEXERIL) tablet 10 mg  10 mg Oral TID PRN [name], MD      . feeding supplement (ENSURE COMPLETE) liquid 237 mL  237 mL Oral TID BM [name], RD   237 mL at 12/10/12 2128  . hydrOXYzine (ATARAX/VISTARIL) tablet 25 mg  25 mg Oral Q4H PRN [name], NP      . magnesium hydroxide (MILK OF MAGNESIA) suspension 30 mL  30 mL Oral Daily PRN [name], MD      . multivitamin with minerals tablet 1 tablet  1 tablet Oral Daily Himabindu Ravi, MD   1 tablet at 12/11/12 0826  . ondansetron (ZOFRAN-ODT) disintegrating tablet 4 mg  4 mg Oral Q8H PRN [name], NP      .  pantoprazole (PROTONIX) EC tablet 40 mg  40 mg Oral Daily Himabindu Ravi, MD   40 mg at 12/11/12 0826  . sertraline (ZOLOFT) tablet 25 mg  25 mg Oral Daily Himabindu Ravi, MD   25 mg at 12/11/12 0826  . simvastatin (ZOCOR) tablet 20 mg  20 mg Oral q1800 [name], MD   20 mg at 12/10/12 1724  . traZODone (DESYREL) tablet 50 mg  50 mg Oral QHS [name], NP   50 mg at 12/10/12 2128    Lab Results:  Results for orders placed during the hospital encounter of 12/09/12 (from the past 48 hour(s))  CBC     Status: Normal   Collection Time   12/10/12  6:23 AM      Component Value Range Comment   WBC 8.4  4.0 - 10.5 K/uL    RBC 4.39  3.87 - 5.11 MIL/uL    Hemoglobin 13.3  12.0 - 15.0 g/dL     HCT 11.9  14.7 - 82.9 %    MCV 87.5  78.0 - 100.0 fL    MCH 30.3  26.0 - 34.0 pg    MCHC 34.6  30.0 - 36.0 g/dL    RDW 56.2  13.0 - 86.5 %    Platelets 359  150 - 400 K/uL   COMPREHENSIVE METABOLIC PANEL     Status: Abnormal   Collection Time   12/10/12  6:23 AM      Component Value Range Comment   Sodium 133 (*) 135 - 145 mEq/L    Potassium 3.9  3.5 - 5.1 mEq/L    Chloride 95 (*) 96 - 112 mEq/L    CO2 29  19 - 32 mEq/L    Glucose, Bld 114 (*) 70 - 99 mg/dL    BUN 10  6 - 23 mg/dL    Creatinine, Ser 7.84  0.50 - 1.10 mg/dL    Calcium 9.5  8.4 - 69.6 mg/dL    Total Protein 6.6  6.0 - 8.3 g/dL    Albumin 3.7  3.5 - 5.2 g/dL    AST 20  0 - 37 U/L    ALT 21  0 - 35 U/L    Alkaline Phosphatase 63  39 - 117 U/L    Total Bilirubin 0.5  0.3 - 1.2 mg/dL    GFR calc non Af Amer >90  >90 mL/min    GFR calc Af Amer >90  >90 mL/min   ETHANOL     Status: Normal   Collection Time   12/10/12  6:23 AM      Component Value Range Comment   Alcohol, Ethyl (B) <11  0 - 11 mg/dL   URINALYSIS, ROUTINE W REFLEX MICROSCOPIC     Status: Abnormal   Collection Time   12/10/12  6:40 AM      Component Value Range Comment   Color, Urine YELLOW  YELLOW    APPearance CLEAR  CLEAR    Specific Gravity, Urine 1.008  1.005 - 1.030    pH 7.0  5.0 - 8.0    Glucose, UA NEGATIVE  NEGATIVE mg/dL    Hgb urine dipstick NEGATIVE  NEGATIVE    Bilirubin Urine NEGATIVE  NEGATIVE    Ketones, ur NEGATIVE  NEGATIVE mg/dL    Protein, ur NEGATIVE  NEGATIVE mg/dL    Urobilinogen, UA 0.2  0.0 - 1.0 mg/dL    Nitrite NEGATIVE  NEGATIVE    Leukocytes, UA TRACE (*) NEGATIVE  URINE RAPID DRUG SCREEN (HOSP PERFORMED)     Status: Normal   Collection Time   12/10/12  6:40 AM      Component Value Range Comment   Opiates NONE DETECTED  NONE DETECTED    Cocaine NONE DETECTED  NONE DETECTED    Benzodiazepines NONE DETECTED  NONE DETECTED    Amphetamines NONE DETECTED  NONE DETECTED    Tetrahydrocannabinol NONE DETECTED   NONE DETECTED    Barbiturates NONE DETECTED  NONE DETECTED   URINE CULTURE     Status: Normal   Collection Time   12/10/12  6:40 AM      Component Value Range Comment   Specimen Description URINE, RANDOM      Special Requests NONE      Culture  Setup Time 12/10/2012 13:53      Colony Count 9,000 COLONIES/ML      Culture INSIGNIFICANT GROWTH      Report Status 12/11/2012 FINAL     URINE MICROSCOPIC-ADD ON     Status: Abnormal   Collection Time   12/10/12  6:40 AM      Component Value Range Comment   Squamous Epithelial / LPF RARE  RARE    WBC, UA 3-6  <3 WBC/hpf    Bacteria, UA MANY (*) RARE   URINALYSIS, ROUTINE W REFLEX MICROSCOPIC     Status: Normal   Collection Time   12/10/12  1:14 PM      Component Value Range Comment   Color, Urine YELLOW  YELLOW    APPearance CLEAR  CLEAR    Specific Gravity, Urine 1.008  1.005 - 1.030    pH 6.5  5.0 - 8.0    Glucose, UA NEGATIVE  NEGATIVE mg/dL    Hgb urine dipstick NEGATIVE  NEGATIVE    Bilirubin Urine NEGATIVE  NEGATIVE    Ketones, ur NEGATIVE  NEGATIVE mg/dL    Protein, ur NEGATIVE  NEGATIVE mg/dL    Urobilinogen, UA 0.2  0.0 - 1.0 mg/dL    Nitrite NEGATIVE  NEGATIVE    Leukocytes, UA NEGATIVE  NEGATIVE MICROSCOPIC NOT DONE ON URINES WITH NEGATIVE PROTEIN, BLOOD, LEUKOCYTES, NITRITE, OR GLUCOSE <1000 mg/dL.    Physical Findings: AIMS: Facial and Oral Movements Muscles of Facial Expression: None, normal Lips and Perioral Area: None, normal Jaw: None, normal Tongue: None, normal,Extremity Movements Upper (arms, wrists, hands, fingers): None, normal Lower (legs, knees, ankles, toes): None, normal, Trunk Movements Neck, shoulders, hips: None, normal, Overall Severity Severity of abnormal movements (highest score from questions above): None, normal Incapacitation due to abnormal movements: None, normal Patient's awareness of abnormal movements (rate only patient's report): No Awareness, Dental Status Current problems with teeth  and/or dentures?: No Does patient usually wear dentures?: No  CIWA:    COWS:     Treatment Plan Summary: Daily contact with patient to assess and evaluate symptoms and progress in treatment Medication management  Plan:  Review of chart, vital signs, medications, and notes. 1-Individual and group therapy 2-Medication management for depression and anxiety:  Medications reviewed with the patient and xanax decreased due patient's desire to stop taking it (taper off), vistaril added to assist with anxiety issues, zofran PRN ordered for nausea since patient had nausea this am, trazodone order updated to repeat x one due to patient's insomnia 3-Coping skills for depression and anxiety development 4-Continue crisis stabilization and management 5-Address health issues--monitoring increase in blood pressure due to anxiety levels 6-Treatment plan in progress to prevent relapse of depression and  anxiety; supportive environment in place to optimize rehab  Medical Decision Making Problem Points:  Established problem, stable/improving (1) and Review of psycho-social stressors (1) Data Points:  Review of medication regiment & side effects (2) Review of new medications or change in dosage (2)  I certify that inpatient services furnished can reasonably be expected to improve the patient's condition.   [name], PMH-NP 12/11/2012, 1:46 PM

## 2012-12-11 NOTE — Progress Notes (Signed)
Patient ID: Angelica Robertson, female   DOB: 03/20/1946, 67 y.o.   MRN: [id number]  Patient awake with c/o nausea. Verne Spurr, PA notified of request; new orders obtained.

## 2012-12-11 NOTE — Progress Notes (Signed)
Psychoeducational Group Note  Date:  12/11/2012 Time:  8:00PM  Group Topic/Focus:  Wrap-Up Group:   The focus of this group is to help patients review their daily goal of treatment and discuss progress on daily workbooks.  Participation Level:  Active  Participation Quality:  Appropriate  Affect:  Appropriate  Cognitive:  Alert and Oriented  Insight:  Developing/Improving  Engagement in Group:  Developing/Improving  Additional Comments:  Pt rated her day as a 5. Pt stated that she had a lots of ups and downs today due to her recent medication changes. Pt stated that her husband and son came to visit and that she was able to visit. Pt stated that she wants to continue to work on eating more.  Doral Ventrella, Randal Buba 12/11/2012, 8:52 PM

## 2012-12-11 NOTE — Progress Notes (Signed)
D: Patient denies SI/HI and A/V hallucinations; patient reports sleep to be fair; reports appetite to be poor ; reports energy level is low ; reports ability to pay attention is good; rates depression as 2/10; rates hopelessness 0/10; rates anxiety as 6/10;   A: Monitored q 15 minutes; patient encouraged to attend groups; patient educated about medications; patient given medications per physician orders; patient encouraged to express feelings and/or concerns  R: Patient is cooperative but very fixated on current situation; patient's interaction with staff and peers is appropriate but interacts minimally with peers; patient is taking medications as prescribed and tolerating medications; patient is attending all groups

## 2012-12-11 NOTE — Progress Notes (Signed)
Pt attended group today and participated in New Year activities.  

## 2012-12-11 NOTE — Progress Notes (Signed)
Patient ID: Angelica Robertson, female   DOB: 1946/06/25, 67 y.o.   MRN: [id number]  Patient asleep at this time; no s/s of distress noted.

## 2012-12-12 MED ORDER — CLONIDINE HCL 0.1 MG PO TABS
0.1000 mg | ORAL_TABLET | Freq: Two times a day (BID) | ORAL | Status: DC
  Administered 2012-12-12 – 2012-12-16 (×9): 0.1 mg via ORAL
  Filled 2012-12-12 (×13): qty 1

## 2012-12-12 MED ORDER — SERTRALINE HCL 50 MG PO TABS
50.0000 mg | ORAL_TABLET | Freq: Every day | ORAL | Status: DC
  Administered 2012-12-13 – 2012-12-16 (×4): 50 mg via ORAL
  Filled 2012-12-12 (×6): qty 1

## 2012-12-12 NOTE — Progress Notes (Signed)
Psychoeducational Group Note  Date:  12/12/2012 Time:  1000  Group Topic/Focus:  Therapeutic Activity  Participation Level:  Active  Participation Quality:  Appropriate, Attentive and Sharing  Affect:  Appropriate  Cognitive:  Appropriate  Insight:  Engaged  Engagement in Group:  Engaged  Additional Comments:  Patient attended group and participated in the therapeutic activity.  Karleen Hampshire Brittini 12/12/2012, 11:29 AM

## 2012-12-12 NOTE — Clinical Social Work Note (Signed)
[facility] LCSW Group Therapy       Living a Balanced Life  1:15-2:30       12/12/2012 2:39 PM    Type of Therapy:  Group Therapy  Participation Level:  Appropriate  Participation Quality:  Appropriate  Affect:  Appropriate  Cognitive:  Attentive Appropriate  Insight:  Engaged  Engagement in Therapy:  Engaged  Modes of Intervention:  Discussion Exploration Problem-Solving Supportive  Summary of Progress/Problems: Patient listened attentively to speaker from [employer].  She shared she loves doing things such as baking and taking care of her family.  She shared she also enjoys traveling.  Wynn Banker 12/12/2012 2:39 PM

## 2012-12-12 NOTE — Progress Notes (Signed)
[facility] MD Progress Note  12/12/2012 10:00 AM Angelica Robertson  MRN:  [id number] Subjective:  Patient tolerating the taper in wellbutrin well, reports some dizziness yesterday morning. Tolerating zoloft well. Mood slightly improved but reports high anxiety.  Diagnosis:   Axis I: Anxiety Disorder NOS and Depressive Disorder NOS Axis II: Deferred Axis III:  Past Medical History  Diagnosis Date  . Depression 2000  . Hypercholesterolemia Osteoporosis  . Heart murmur    Axis IV: other psychosocial or environmental problems Axis V: 51-60 moderate symptoms  ADL's:  Intact  Sleep: Fair  Appetite:  Fair   Psychiatric Specialty Exam: Review of Systems  Constitutional: Negative.   HENT: Negative.   Eyes: Negative.   Respiratory: Negative.   Cardiovascular: Negative.   Gastrointestinal: Negative.   Genitourinary: Negative.   Musculoskeletal: Negative.   Skin: Negative.   Neurological: Negative.   Endo/Heme/Allergies: Negative.   Psychiatric/Behavioral: Positive for depression. The patient is nervous/anxious.     Blood pressure 159/81, pulse 101, temperature 97.4 F (36.3 C), temperature source Oral, resp. rate 18, height 5\' 4"  (1.626 m), weight 57.153 kg (126 lb), last menstrual period 11/27/1979.Body mass index is 21.63 kg/(m^2).  General Appearance: Casual  Eye Contact::  Fair  Speech:  Slow  Volume:  Decreased  Mood:  Dysphoric  Affect:  Depressed  Thought Process:  Coherent  Orientation:  Full (Time, Place, and Person)  Thought Content:  WDL  Suicidal Thoughts:  No  Homicidal Thoughts:  No  Memory:  Immediate;   Fair Recent;   Fair Remote;   Fair  Judgement:  Fair  Insight:  Fair  Psychomotor Activity:  Normal  Concentration:  Fair  Recall:  Fair  Akathisia:  No  Handed:  Right  AIMS (if indicated):     Assets:  Communication Skills Desire for Improvement  Sleep:  Number of Hours: 6.25    Current Medications: Current Facility-Administered Medications  Medication  Dose Route Frequency Provider Last Rate Last Dose  . acetaminophen (TYLENOL) tablet 650 mg  650 mg Oral Q6H PRN [name], MD      . ALPRAZolam Prudy Feeler) tablet 0.25 mg  0.25 mg Oral BID PRN [name], NP      . alum & mag hydroxide-simeth (MAALOX/MYLANTA) 200-200-20 MG/5ML suspension 30 mL  30 mL Oral Q4H PRN [name], MD      . buPROPion (WELLBUTRIN XL) 24 hr tablet 150 mg  150 mg Oral Daily Zaley Talley, MD   150 mg at 12/12/12 0759  . calcium-vitamin D (OSCAL WITH D) 500-200 MG-UNIT per tablet 1 tablet  1 tablet Oral Daily Ameila Weldon, MD   1 tablet at 12/12/12 0800  . cyclobenzaprine (FLEXERIL) tablet 10 mg  10 mg Oral TID PRN [name], MD      . feeding supplement (ENSURE COMPLETE) liquid 237 mL  237 mL Oral TID BM [name], RD   237 mL at 12/11/12 1358  . hydrOXYzine (ATARAX/VISTARIL) tablet 25 mg  25 mg Oral Q4H PRN [name], NP      . magnesium hydroxide (MILK OF MAGNESIA) suspension 30 mL  30 mL Oral Daily PRN [name], MD      . multivitamin with minerals tablet 1 tablet  1 tablet Oral Daily Marquel Spoto, MD   1 tablet at 12/12/12 0800  . ondansetron (ZOFRAN-ODT) disintegrating tablet 4 mg  4 mg Oral Q8H PRN [name], NP   4 mg at 12/12/12 0644  . pantoprazole (  PROTONIX) EC tablet 40 mg  40 mg Oral Daily Mystery Schrupp, MD   40 mg at 12/12/12 0800  . sertraline (ZOLOFT) tablet 25 mg  25 mg Oral Daily Rileyann Florance, MD   25 mg at 12/12/12 0801  . simvastatin (ZOCOR) tablet 20 mg  20 mg Oral q1800 [name], MD   20 mg at 12/11/12 1718  . traZODone (DESYREL) tablet 50 mg  50 mg Oral QHS [name], NP   50 mg at 12/11/12 2144    Lab Results:  Results for orders placed during the hospital encounter of 12/09/12 (from the past 48 hour(s))  URINALYSIS, ROUTINE W REFLEX MICROSCOPIC     Status: Normal   Collection Time   12/10/12  1:14 PM      Component Value Range Comment   Color, Urine YELLOW  YELLOW    APPearance CLEAR   CLEAR    Specific Gravity, Urine 1.008  1.005 - 1.030    pH 6.5  5.0 - 8.0    Glucose, UA NEGATIVE  NEGATIVE mg/dL    Hgb urine dipstick NEGATIVE  NEGATIVE    Bilirubin Urine NEGATIVE  NEGATIVE    Ketones, ur NEGATIVE  NEGATIVE mg/dL    Protein, ur NEGATIVE  NEGATIVE mg/dL    Urobilinogen, UA 0.2  0.0 - 1.0 mg/dL    Nitrite NEGATIVE  NEGATIVE    Leukocytes, UA NEGATIVE  NEGATIVE MICROSCOPIC NOT DONE ON URINES WITH NEGATIVE PROTEIN, BLOOD, LEUKOCYTES, NITRITE, OR GLUCOSE <1000 mg/dL.    Physical Findings: AIMS: Facial and Oral Movements Muscles of Facial Expression: None, normal Lips and Perioral Area: None, normal Jaw: None, normal Tongue: None, normal,Extremity Movements Upper (arms, wrists, hands, fingers): None, normal Lower (legs, knees, ankles, toes): None, normal, Trunk Movements Neck, shoulders, hips: None, normal, Overall Severity Severity of abnormal movements (highest score from questions above): None, normal Incapacitation due to abnormal movements: None, normal Patient's awareness of abnormal movements (rate only patient's report): No Awareness, Dental Status Current problems with teeth and/or dentures?: No Does patient usually wear dentures?: No  CIWA:  CIWA-Ar Total: 0  COWS:  COWS Total Score: 2   Treatment Plan Summary: Daily contact with patient to assess and evaluate symptoms and progress in treatment Medication management  Plan: Increase Zoloft to 50mg  po qd. Continue current plan of care.  Medical Decision Making Problem Points:  Established problem, stable/improving (1), Review of last therapy session (1) and Review of psycho-social stressors (1) Data Points:  Review of medication regiment & side effects (2) Review of new medications or change in dosage (2)  I certify that inpatient services furnished can reasonably be expected to improve the patient's condition.   Angelica Robertson 12/12/2012, 10:00 AM

## 2012-12-12 NOTE — Progress Notes (Signed)
D:  Patient's self inventory sheet, patient has poor sleep, improving appetite, between low and normal energy level, good attention span.  Rated depression and hopelessness #2.  Has experienced cravings and tremors in past 24 hours.  Denied SI.  In past 24 hours, has felt lightheaded, had headache, nauseated but not as bad as yesterday.  Pain goal zero she hopes.  Worst pain #3.  After discharge, plans to "eeat well, exercise, spend time with family and friends.  Sleep medication not helping me, also new roommate was disturbing during the night."  No problems taking meds after discharge. A:  Medications administered per MD order.  Support and encouragement given throughout day.  Support and safety checks completed as ordered, R:  Following treatment plan.  Denied SI and HI.  Denied A/V hallucinations.  Contracts for safety.  Patient remains safe and receptive on unit.

## 2012-12-12 NOTE — Progress Notes (Signed)
Patient did attend half of  the evening karaoke group. Pt returned to her room once on unit.

## 2012-12-12 NOTE — Progress Notes (Signed)
Reviewed

## 2012-12-12 NOTE — Clinical Social Work Note (Signed)
[facility] LCSW Aftercare Discharge Planning Group Note       8:30-9:30 AM  1/2/201410:00 AM  Participation Quality:  Appropriate  Affect:  Appropriate  Cognitive:  Appropriate  Insight:  Engaged  Engagement in Group:  Engaged  Modes of Intervention:  Education, Exploration, Problem-solving and Support  Summary of Progress/Problems:  Patient reports doing well.  She denies SI/HI and rates depression at three and anxiety at five.  Patient shared she admitted to stabilize on medications.  She advised she had been seeing a MD in [facility] but wants to follow up with someone in [place] which is closer to home.  She stated her PCP has made a recommendation and her husband is following up on the information.  Patient has home, transportation, and access to medications.  Wynn Banker 12/12/2012 10:00 AM

## 2012-12-12 NOTE — Progress Notes (Signed)
Patient ID: Angelica Robertson, female   DOB: 1946/03/13, 67 y.o.   MRN: [id number] D: pt. In day room watching TV and interacting with other clients. Pt. Reports "meds changed, take some getting use to" A: Writer provided emotional support by listening. Staff will monitor q59min for safety. Pt. Encouraged to continue groups. R: pt. Is safe on the unit.  Pt. Attended group.

## 2012-12-12 NOTE — Progress Notes (Signed)
Patient ID: Angelica Robertson, female   DOB: 1946/09/22, 67 y.o.   MRN: [id number] D: Pt. Visiting with husband, walking the hall, smiling.  Pt. Reports day was better. Pt. Preoccupied with room mate "she's not getting up" "she stays in bed" A: Staff will monitor q16min for safety. Pt. Encouraged to attend group. Writer encouraged pt. To focus on her well being. R: Pt. Is safe on the unit. Pt. Goes to [employer].

## 2012-12-13 MED ORDER — TRAZODONE HCL 50 MG PO TABS
50.0000 mg | ORAL_TABLET | Freq: Every evening | ORAL | Status: DC | PRN
  Administered 2012-12-13: 50 mg via ORAL
  Filled 2012-12-13 (×2): qty 1

## 2012-12-13 NOTE — Progress Notes (Signed)
Writer spoke with patient 1:1 before she attended group. Patient reports that her day has been good but she did not rest well on last night. Patient was informed that she has a repeat of trazadone if needed. Patient reports that she has neck pain occassionally and was offered a cold pack to aid with pain if needed, patient reported that she would get the cold pack with her hs medication. Patient voiced no complaints, denies si/hi/a/v hallucinations, support and encouragement offered. Safety maintained on unit, will continue to monitor.

## 2012-12-13 NOTE — Progress Notes (Signed)
D) Pt rates her depression and her hopelessness both at a 3. Denies SI and HI. Has attended the groups and interacts with select peers. Pt did write this morning that she wanted to move to another room because she felt that being in there was hurting her because her roommate was up a lot during the night. Pt is focused on her physical ailments and listed them this morning during medication pass. A) Given support, reassurance and praise. Encouraged in attending the groups and interacting with her peers. Pt's room was change to accomodate Pt sleeping at night. R) Denies SI and HI. Attending groups. Affect is flat and mood depressed.

## 2012-12-13 NOTE — Tx Team (Signed)
Interdisciplinary Treatment Plan Update (Adult)  Date:  12/13/2012  Time Reviewed:  9:59 AM   Progress in Treatment: Attending groups:   Yes   Participating in groups:  Yes Taking medication as prescribed:  Yes Tolerating medication:  Yes Family/Significant othe contact made: Contact made with family Patient understands diagnosis:  Yes Discussing patient identified problems/goals with staff: Yes Medical problems stabilized or resolved: Yes Denies suicidal/homicidal ideation:Yes Issues/concerns per patient self-inventory:  Other:   New problem(s) identified:  Reason for Continuation of Hospitalization: Anxiety Depression Medication stabilization  Interventions implemented related to continuation of hospitalization:  Medication Management; safety checks q 15 mins  Additional comments:  Estimated length of stay:  2-3 days  Discharge Plan:  Home with outpatient follow up  New goal(s):  Review of initial/current patient goals per problem list:    1.  Goal(s): Eliminate SI/other thoughts of self harm   Met:  Yes  Target date: d/c  As evidenced by: Patient no longer endorsing SI/HI or other thoughts of self harm.    2.  Goal (s):Reduce anxiety (rated at five)  Met: Yes  Target date: d/c  As evidenced by: Patient currently rating symptoms at four or below    3.  Goal(s):.stabilize on meds   Met:  No  Target date: d/c  As evidenced by: Patient will report being stabilized on medications - less symptomatic    4.  Goal(s): Refer for outpatient follow up   Met:  No  Target date: d/c  As evidenced by: Follow up appointment will be scheduled    Attendees: Patient:   12/13/2012 9:59 AM  Physican:  Patrick North, MD 12/13/2012 9:59 AM  Nursing:    Berneice Heinrich, RN 12/13/2012 9:59 AM   Nursing:    12/13/2012 9:59 AM   Clinical Social Worker:  Juline Patch, LCSW 12/13/2012 9:59 AM   Other: Tera Helper, PHM-NP 12/13/2012 9:59 AM   Other:  Patton Salles,  Clinical Social Worker, LCSW  12/13/2012 9:59 AM Other:        12/13/2012 9:59 AM

## 2012-12-13 NOTE — Progress Notes (Signed)
[facility] INPATIENT:  Family/Significant Other Suicide Prevention Education  Suicide Prevention Education:  Education Completed; Greenlee Ancheta, Husband, [phone] has been identified by the patient as the family member/significant other with whom the patient will be residing, and identified as the person(s) who will aid the patient in the event of a mental health crisis (suicidal ideations/suicide attempt).  With written consent from the patient, the family member/significant other has been provided the following suicide prevention education, prior to the and/or following the discharge of the patient.  The suicide prevention education provided includes the following:  Suicide risk factors  Suicide prevention and interventions  National Suicide Hotline telephone number  [facility] assessment telephone number  [facility] Emergency Assistance 911  [facility] and/or Residential Mobile Crisis Unit telephone number  Request made of family/significant other to:  Remove weapons (e.g., guns, rifles, knives), all items previously/currently identified as safety concern.  Husband reports there are no guns in the home  Remove drugs/medications (over-the-counter, prescriptions, illicit drugs), all items previously/currently identified as a safety concern.  The family member/significant other verbalizes understanding of the suicide prevention education information provided.  The family member/significant other agrees to remove the items of safety concern listed above.  Wynn Banker 12/13/2012, 8:13 AM

## 2012-12-13 NOTE — Clinical Social Work Note (Signed)
[facility] LCSW Group Therapy        Feelings Around Relapse        1:15-2:30 PM        12/13/2012 3:43 PM    Type of Therapy:  Group Therapy  Participation Level:  Appropriate  Participation Quality:  Appropriate  Affect:  Appropriate  Cognitive:  Attentive Appropriate  Insight:  Engaged  Engagement in Therapy:  Engaged  Modes of Intervention:  Discussion Exploration Problem-Solving Supportive  Summary of Progress/Problems:  Patient shared he relapsed into depression after years of stopping her medications.  She shared she knows she will need to continue taking medications for life.  Angelica Robertson 12/13/2012 3:43 PM

## 2012-12-13 NOTE — Progress Notes (Signed)
Psychoeducational Group Note  Date:  12/13/2012 Time: 1100  Group Topic/Focus:  Early Warning Signs:   The focus of this group is to help patients identify signs or symptoms they exhibit before slipping into an unhealthy state or crisis.  Participation Level:  Minimal  Participation Quality:  Appropriate and Attentive  Affect:  Appropriate  Cognitive:  Appropriate  Insight:  Developing/Improving  Engagement in Group:  Developing/Improving  Additional Comments:   Patient attended group and shared during group. Patient was asked to share a time when he was in a crisis and what were the warning signs of the situation. The areas of the changes that are made during a crisis can consist of thought, behavior, attitude and feelings. Patient shared which areas and explain some of the changes that were made during the time of a crisis. Patient was asked to complete Early Warning signs worksheet in workbook during the group.  Karleen Hampshire Brittini 12/13/2012, 6:50 PM

## 2012-12-13 NOTE — Progress Notes (Signed)
[name] Progress Note  12/13/2012 10:40 AM Angelica Robertson  MRN:  [id number] Subjective:  Patient see, endorsing anxiety. Has neck pain from a poor sleeping position. Tolerating the medication changes well, mood slightly improved.  Diagnosis:   Axis I: Major Depression, Recurrent severe Axis II: Deferred Axis III:  Past Medical History  Diagnosis Date  . Depression 2000  . Hypercholesterolemia Osteoporosis  . Heart murmur    Axis IV: other psychosocial or environmental problems Axis V: 51-60 moderate symptoms  ADL's:  Intact  Sleep: Poor  Appetite:  Fair  Psychiatric Specialty Exam: Review of Systems  Constitutional: Negative.   HENT: Positive for neck pain.   Eyes: Negative.   Respiratory: Negative.   Cardiovascular: Negative.   Gastrointestinal: Negative.   Genitourinary: Negative.   Skin: Negative.   Neurological: Negative.   Endo/Heme/Allergies: Negative.   Psychiatric/Behavioral: Positive for depression. The patient is nervous/anxious.     Blood pressure 113/76, pulse 95, temperature 96.4 F (35.8 C), temperature source Oral, resp. rate 24, height 5\' 4"  (1.626 m), weight 57.153 kg (126 lb), last menstrual period 11/27/1979.Body mass index is 21.63 kg/(m^2).  General Appearance: Casual  Eye Contact::  Good  Speech:  Clear and Coherent  Volume:  Decreased  Mood:  Dysphoric  Affect:  Constricted  Thought Process:  Coherent  Orientation:  Full (Time, Place, and Person)  Thought Content:  WDL  Suicidal Thoughts:  No  Homicidal Thoughts:  No  Memory:  Immediate;   Fair Recent;   Fair Remote;   Fair  Judgement:  Fair  Insight:  Fair  Psychomotor Activity:  Normal  Concentration:  Fair  Recall:  Fair  Akathisia:  No  Handed:  Right  AIMS (if indicated):     Assets:  Communication Skills Desire for Improvement  Sleep:  Number of Hours: 4.75    Current Medications: Current Facility-Administered Medications  Medication Dose Route Frequency Provider Last Rate  Last Dose  . acetaminophen (TYLENOL) tablet 650 mg  650 mg Oral Q6H PRN [name], MD   650 mg at 12/12/12 1716  . ALPRAZolam Prudy Feeler) tablet 0.25 mg  0.25 mg Oral BID PRN [name], NP   0.25 mg at 12/13/12 6578  . alum & mag hydroxide-simeth (MAALOX/MYLANTA) 200-200-20 MG/5ML suspension 30 mL  30 mL Oral Q4H PRN [name], MD      . calcium-vitamin D (OSCAL WITH D) 500-200 MG-UNIT per tablet 1 tablet  1 tablet Oral Daily [name], MD   1 tablet at 12/13/12 0741  . cloNIDine (CATAPRES) tablet 0.1 mg  0.1 mg Oral BID [name], NP   0.1 mg at 12/13/12 0751  . cyclobenzaprine (FLEXERIL) tablet 10 mg  10 mg Oral TID PRN [name], MD      . feeding supplement (ENSURE COMPLETE) liquid 237 mL  237 mL Oral TID BM [name], RD   237 mL at 12/12/12 1441  . hydrOXYzine (ATARAX/VISTARIL) tablet 25 mg  25 mg Oral Q4H PRN [name], NP      . magnesium hydroxide (MILK OF MAGNESIA) suspension 30 mL  30 mL Oral Daily PRN [name], MD      . multivitamin with minerals tablet 1 tablet  1 tablet Oral Daily [name], MD   1 tablet at 12/13/12 0741  . ondansetron (ZOFRAN-ODT) disintegrating tablet 4 mg  4 mg Oral Q8H PRN [name], NP   4 mg at 12/13/12 0251  . pantoprazole (PROTONIX) EC  tablet 40 mg  40 mg Oral Daily [name], MD   40 mg at 12/13/12 0741  . sertraline (ZOLOFT) tablet 50 mg  50 mg Oral Daily [name], MD   50 mg at 12/13/12 0741  . simvastatin (ZOCOR) tablet 20 mg  20 mg Oral q1800 [name], MD   20 mg at 12/12/12 1713  . traZODone (DESYREL) tablet 50 mg  50 mg Oral QHS [name], NP   50 mg at 12/12/12 2145    Lab Results: No results found for this or any previous visit (from the past 48 hour(s)).  Physical Findings: AIMS: Facial and Oral Movements Muscles of Facial Expression: None, normal Lips and Perioral Area: None, normal Jaw: None, normal Tongue: None, normal,Extremity Movements Upper (arms, wrists,  hands, fingers): None, normal Lower (legs, knees, ankles, toes): None, normal, Trunk Movements Neck, shoulders, hips: None, normal, Overall Severity Severity of abnormal movements (highest score from questions above): None, normal Incapacitation due to abnormal movements: None, normal Patient's awareness of abnormal movements (rate only patient's report): No Awareness, Dental Status Current problems with teeth and/or dentures?: No Does patient usually wear dentures?: No  CIWA:  CIWA-Ar Total: 0  COWS:  COWS Total Score: 2   Treatment Plan Summary: Daily contact with patient to assess and evaluate symptoms and progress in treatment Medication management  Plan: Discontinue Wellbutrin. Continue current plan of care.  Medical Decision Making Problem Points:  Established problem, stable/improving (1), Review of last therapy session (1) and Review of psycho-social stressors (1) Data Points:  Review of medication regiment & side effects (2) Review of new medications or change in dosage (2)  I certify that inpatient services furnished can reasonably be expected to improve the patient's condition.   Blaine Guiffre 12/13/2012, 10:40 AM

## 2012-12-13 NOTE — Clinical Social Work Note (Signed)
[facility] LCSW Aftercare Discharge Planning Group Note       8:30-9:30 AM  1/3/201410:13 AM  Participation Quality:  Appropriate  Affect:  Appropriate  Cognitive:  Appropriate  Insight:  Engaged  Engagement in Group:  Engaged  Modes of Intervention:  Education, Exploration, Geophysicist/field seismologist of Progress/Problems:  Patient reports having back pain which has been made worse due to the bed she is sleeping in. She denies SI/HI and rates depression at three and anxiety at six.  She states she would like to discharge but is having stomach problems due to the medications.  Wynn Banker 12/13/2012 10:13 AM

## 2012-12-14 MED ORDER — TRAZODONE HCL 100 MG PO TABS
100.0000 mg | ORAL_TABLET | Freq: Every evening | ORAL | Status: DC | PRN
  Administered 2012-12-14 – 2012-12-15 (×2): 100 mg via ORAL
  Filled 2012-12-14 (×9): qty 1

## 2012-12-14 NOTE — Clinical Social Work Psychosocial (Signed)
[facility] Group Notes:  (Clinical Social Work)  12/14/2012   3:00-4:00PM  Summary of Progress/Problems:   The main focus of today's process group was for the patient to identify ways in which they have in the past sabotaged their own recovery and reasons they may have done this/what they received from doing it.  We then worked to identify a specific plan to avoid doing this when discharged from the hospital for this admission.  The group was very uncommunicative today, with a lot of people who had been through the same class last week.  CSW asked if there were other things they wanted to discuss, and there was a brief discussion about people who were afraid of leaving the hospital, and those who are excited to leave.  The patient expressed that she is excited to go home with her husband.  They have been married 45 years, and he is visiting every night.  She stated she sometimes self-sabotages by isolating herself at home, but she is trying to get better about this, and her husband is a helpful support.  Type of Therapy:  Group Therapy - Process  Participation Level:  Active  Participation Quality:  Appropriate, Attentive and Sharing  Affect:  Appropriate and Blunted  Cognitive:  Alert, Appropriate and Oriented  Insight:  Engaged  Engagement in Therapy:  Engaged  Modes of Intervention:  Clarification, Education, Limit-setting, Problem-solving, Socialization, Support and Processing, Exploration, Discussion   Ambrose Mantle, LCSW 12/14/2012, 4:56 PM

## 2012-12-14 NOTE — Progress Notes (Signed)
Writer spoke with patient before she attended group. Patient reports that she has had a good day and her husband had visited like he has since she has been here. Patient was offered her scheduled ensure and she reports that she has been going to meals and has eaten and still feels full from dinner and so she refused her ensure. Patient c/o neck spasms and was given her prn flexeril along with and ice pack to alleviate her pain. Patient was aware of the increase in her trazadone for sleep and is hopeful this will work. Patient given support and encouragement, safety maintained on unit, denies si/hi/a/v hallucinations. Will continue to monitor.

## 2012-12-14 NOTE — Progress Notes (Signed)
D) Pt has attended the groups and interacts with her peers. In a 1:1 talked about how the doctor that she was seeing started her on all these medications and now looking back, she feels that it really wasn't good. States that she wants very much to feel back to herself and to get off the medications that her old psychiatrist put her on. Verbalized feelings of anger. A) Given support  Reassurance and praise. Educated on her present medications and given much encouragement. Encouraged to go to her groups. Pt's goal to day was to eat at least 75 percent of her meal.  R) Pt states that she met her goal and is starting to feel a little bit back to herself.

## 2012-12-14 NOTE — Progress Notes (Signed)
[facility] MD Progress Note  12/14/2012 10:38 AM Angelica Robertson  MRN:  [id number] Subjective:  3/10 depression, 1/10 anxiety Diagnosis:   Axis I: Anxiety Disorder NOS and Depressive Disorder NOS Axis II: Deferred Axis III:  Past Medical History  Diagnosis Date  . Depression 2000  . Hypercholesterolemia Osteoporosis  . Heart murmur    Axis IV: other psychosocial or environmental problems, problems related to social environment and problems with primary support group Axis V: 41-50 serious symptoms  ADL's:  Intact  Sleep: Poor  Appetite:  Fair  Suicidal Ideation:  Denies Homicidal Ideation:  Denies  Psychiatric Specialty Exam: Review of Systems  Constitutional: Negative.   HENT: Negative.   Eyes: Negative.   Respiratory: Negative.   Cardiovascular: Negative.   Gastrointestinal: Negative.   Genitourinary: Negative.   Musculoskeletal: Negative.   Skin: Negative.   Neurological: Negative.   Endo/Heme/Allergies: Negative.   Psychiatric/Behavioral: Positive for depression. The patient is nervous/anxious and has insomnia.     Blood pressure 121/71, pulse 94, temperature 98.1 F (36.7 C), temperature source Oral, resp. rate 16, height 5\' 4"  (1.626 m), weight 57.153 kg (126 lb), last menstrual period 11/27/1979.Body mass index is 21.63 kg/(m^2).  General Appearance: Casual  Eye Contact::  Fair  Speech:  Normal Rate  Volume:  Decreased  Mood:  Anxious and Depressed  Affect:  Depressed  Thought Process:  Coherent  Orientation:  Full (Time, Place, and Person)  Thought Content:  WDL  Suicidal Thoughts:  No  Homicidal Thoughts:  No  Memory:  Immediate;   Fair Recent;   Fair Remote;   Fair  Judgement:  Fair  Insight:  Fair  Psychomotor Activity:  Decreased  Concentration:  Fair  Recall:  Fair  Akathisia:  No  Handed:  Right  AIMS (if indicated):     Assets:  Communication Skills Desire for Improvement  Sleep:  Number of Hours: 5.75    Current Medications: Current  Facility-Administered Medications  Medication Dose Route Frequency Provider Last Rate Last Dose  . acetaminophen (TYLENOL) tablet 650 mg  650 mg Oral Q6H PRN [name], MD   650 mg at 12/12/12 1716  . ALPRAZolam Prudy Feeler) tablet 0.25 mg  0.25 mg Oral BID PRN [name], NP   0.25 mg at 12/13/12 4098  . alum & mag hydroxide-simeth (MAALOX/MYLANTA) 200-200-20 MG/5ML suspension 30 mL  30 mL Oral Q4H PRN [name], MD      . calcium-vitamin D (OSCAL WITH D) 500-200 MG-UNIT per tablet 1 tablet  1 tablet Oral Daily Himabindu Ravi, MD   1 tablet at 12/14/12 [phone]  . cloNIDine (CATAPRES) tablet 0.1 mg  0.1 mg Oral BID [name], NP   0.1 mg at 12/14/12 4782  . cyclobenzaprine (FLEXERIL) tablet 10 mg  10 mg Oral TID PRN [name], MD      . feeding supplement (ENSURE COMPLETE) liquid 237 mL  237 mL Oral TID BM [name], RD   237 mL at 12/14/12 1003  . hydrOXYzine (ATARAX/VISTARIL) tablet 25 mg  25 mg Oral Q4H PRN [name], NP   25 mg at 12/14/12 0841  . magnesium hydroxide (MILK OF MAGNESIA) suspension 30 mL  30 mL Oral Daily PRN [name], MD      . multivitamin with minerals tablet 1 tablet  1 tablet Oral Daily Himabindu Ravi, MD   1 tablet at 12/14/12 9562  . ondansetron (ZOFRAN-ODT) disintegrating tablet 4 mg  4 mg Oral Q8H PRN Catha Nottingham  Lord, NP   4 mg at 12/13/12 0251  . pantoprazole (PROTONIX) EC tablet 40 mg  40 mg Oral Daily Himabindu Ravi, MD   40 mg at 12/14/12 4098  . sertraline (ZOLOFT) tablet 50 mg  50 mg Oral Daily Himabindu Ravi, MD   50 mg at 12/14/12 1191  . simvastatin (ZOCOR) tablet 20 mg  20 mg Oral q1800 [name], MD   20 mg at 12/13/12 1920  . traZODone (DESYREL) tablet 100 mg  100 mg Oral QHS,MR X 1 [name], NP        Lab Results: No results found for this or any previous visit (from the past 48 hour(s)).  Physical Findings: AIMS: Facial and Oral Movements Muscles of Facial Expression: None, normal Lips and Perioral Area:  None, normal Jaw: None, normal Tongue: None, normal,Extremity Movements Upper (arms, wrists, hands, fingers): None, normal Lower (legs, knees, ankles, toes): None, normal, Trunk Movements Neck, shoulders, hips: None, normal, Overall Severity Severity of abnormal movements (highest score from questions above): None, normal Incapacitation due to abnormal movements: None, normal Patient's awareness of abnormal movements (rate only patient's report): No Awareness, Dental Status Current problems with teeth and/or dentures?: No Does patient usually wear dentures?: No  CIWA:  CIWA-Ar Total: 0  COWS:  COWS Total Score: 2   Treatment Plan Summary: Daily contact with patient to assess and evaluate symptoms and progress in treatment Medication management  Plan:  Review of chart, vital signs, medications, and notes. 1-Individual and group therapy 2-Medication management for depression and anxiety:  Medications reviewed with the patient and trazodone increased to promote sleep 3-Coping skills for depression, anxiety 4-Continue crisis stabilization and management 5-Address health issues--monitoring blood pressures 6-Treatment plan in progress to prevent relapse of depression and anxiety:  Weaning patient from benzodiazepines onto vistaril for anxiety  Medical Decision Making Problem Points:  Established problem, stable/improving (1) and Review of psycho-social stressors (1) Data Points:  Review of new medications or change in dosage (2)  I certify that inpatient services furnished can reasonably be expected to improve the patient's condition.   [name], PMH-NP 12/14/2012, 10:38 AM

## 2012-12-15 MED ORDER — DOCUSATE SODIUM 100 MG PO CAPS
100.0000 mg | ORAL_CAPSULE | Freq: Two times a day (BID) | ORAL | Status: DC
  Administered 2012-12-15 – 2012-12-16 (×2): 100 mg via ORAL
  Filled 2012-12-15 (×8): qty 1

## 2012-12-15 MED ORDER — POLYETHYLENE GLYCOL 3350 17 G PO PACK
17.0000 g | PACK | Freq: Every day | ORAL | Status: DC
  Administered 2012-12-15 – 2012-12-16 (×2): 17 g via ORAL
  Filled 2012-12-15 (×5): qty 1

## 2012-12-15 NOTE — Progress Notes (Signed)
D) Pt has attended the program and interacts with select peers. Denies SI and HI. States she is feeling better overall. Rates her depression at a 3 and her hopelessness at a 1. Affect appears brighter with interaction.  A) Given support and reassurance. R) Denies SI and HI.

## 2012-12-15 NOTE — Clinical Social Work Psychosocial (Signed)
[facility] Group Notes:  (Clinical Social Work)  12/15/2012   3:00-4:00PM  Summary of Progress/Problems:   Summary of Progress/Problems:   The main focus of today's process group was for the patient to define "support" and describe what healthy supports are, then to identify the patient's current support system and decide on other supports that can be put in place to prevent future hospitalizations.  Roleplay was used to demonstrate definitions of different types of available supports.  An emphasis was placed on using therapist, doctor, therapy groups, self-help groups and problem-specific support groups to expand supports. Additionally, psychoeducation on various symptoms of mental illness was done at patients' requests.  The patient expressed comfort with the topic, and williness to expand her supports although it appears she has excellent supports in place already.  Type of Therapy:  Process Group  Participation Level:  Active  Participation Quality:  Attentive  Affect:  Not Congruent and has a forced smile on her face all the time  Cognitive:  Oriented  Insight:  Engaged  Engagement in Therapy:  Engaged  Modes of Intervention:  Clarification, Education, Limit-setting, Problem-solving, Socialization, Support and Processing, Exploration, Discussion, Role-Play   Ambrose Mantle, LCSW 12/15/2012, 4:23 PM

## 2012-12-15 NOTE — Progress Notes (Signed)
Patient has been up and active on the unit, plans to  attend group this evening and has voiced no complaints. Patient reported that her husband had visited today and she has had a good day.  Patient currently denies having pain, -si/hi/a/v hall. Support and encouragement offered, safety maintained on unit, will continue to monitor.

## 2012-12-15 NOTE — Progress Notes (Signed)
Psychoeducational Group Note  Date:  12/15/2012 Time:  1315  Group Topic/Focus:  Conflict Resolution:   The focus of this group is to discuss the conflict resolution process and how it may be used upon discharge.  Participation Level:  Minimal  Participation Quality:  Appropriate and Attentive  Affect:  Appropriate  Cognitive:  Appropriate  Insight:  Engaged  Engagement in Group:  Improving   Dalia Heading 12/15/2012, 4:07 PM

## 2012-12-15 NOTE — Progress Notes (Signed)
Agree 

## 2012-12-15 NOTE — Progress Notes (Signed)
[facility] MD Progress Note  12/15/2012 2:36 PM Angelica Robertson  MRN:  [id number] Subjective:  0/10 depression, 3/10 anxiety, constipation Diagnosis:   Axis I: Depressive Disorder NOS and Generalized Anxiety Disorder Axis II: Deferred Axis III:  Past Medical History  Diagnosis Date  . Depression 2000  . Hypercholesterolemia Osteoporosis  . Heart murmur    Axis IV: other psychosocial or environmental problems Axis V: 51-60 moderate symptoms  ADL's:  Intact  Sleep: Good  Appetite:  Fair  Suicidal Ideation:  Denies Homicidal Ideation:  Denies  Psychiatric Specialty Exam: Review of Systems  Constitutional: Negative.   HENT: Negative.   Eyes: Negative.   Respiratory: Negative.   Cardiovascular: Negative.   Gastrointestinal: Positive for constipation.  Genitourinary: Negative.   Musculoskeletal: Negative.   Skin: Negative.   Neurological: Negative.   Psychiatric/Behavioral: The patient is nervous/anxious.     Blood pressure 123/67, pulse 86, temperature 97.3 F (36.3 C), temperature source Oral, resp. rate 16, height 5\' 4"  (1.626 m), weight 57.153 kg (126 lb), last menstrual period 11/27/1979.Body mass index is 21.63 kg/(m^2).  General Appearance: Casual  Eye Contact::  Fair  Speech:  Normal Rate  Volume:  Normal  Mood:  Anxious  Affect:  Congruent  Thought Process:  Coherent  Orientation:  Full (Time, Place, and Person)  Thought Content:  WDL  Suicidal Thoughts:  No  Homicidal Thoughts:  No  Memory:  Immediate;   Fair Recent;   Fair Remote;   Fair  Judgement:  Fair  Insight:  Fair  Psychomotor Activity:  Normal  Concentration:  Fair  Recall:  Fair  Akathisia:  No  Handed:  Right  AIMS (if indicated):     Assets:  Communication Skills Desire for Improvement Housing Physical Health Resilience Social Support  Sleep:  Number of Hours: 6.5    Current Medications: Current Facility-Administered Medications  Medication Dose Route Frequency Provider Last Rate Last  Dose  . acetaminophen (TYLENOL) tablet 650 mg  650 mg Oral Q6H PRN [name], MD   650 mg at 12/14/12 1143  . ALPRAZolam Prudy Feeler) tablet 0.25 mg  0.25 mg Oral BID PRN [name], NP   0.25 mg at 12/13/12 4098  . alum & mag hydroxide-simeth (MAALOX/MYLANTA) 200-200-20 MG/5ML suspension 30 mL  30 mL Oral Q4H PRN [name], MD      . calcium-vitamin D (OSCAL WITH D) 500-200 MG-UNIT per tablet 1 tablet  1 tablet Oral Daily Himabindu Ravi, MD   1 tablet at 12/15/12 0830  . cloNIDine (CATAPRES) tablet 0.1 mg  0.1 mg Oral BID [name], NP   0.1 mg at 12/15/12 0830  . cyclobenzaprine (FLEXERIL) tablet 10 mg  10 mg Oral TID PRN [name], MD   10 mg at 12/14/12 2000  . docusate sodium (COLACE) capsule 100 mg  100 mg Oral BID [name], NP      . feeding supplement (ENSURE COMPLETE) liquid 237 mL  237 mL Oral TID BM [name], RD   237 mL at 12/15/12 1418  . hydrOXYzine (ATARAX/VISTARIL) tablet 25 mg  25 mg Oral Q4H PRN [name], NP   25 mg at 12/14/12 0841  . magnesium hydroxide (MILK OF MAGNESIA) suspension 30 mL  30 mL Oral Daily PRN [name], MD      . multivitamin with minerals tablet 1 tablet  1 tablet Oral Daily Himabindu Ravi, MD   1 tablet at 12/15/12 0830  . ondansetron (ZOFRAN-ODT) disintegrating tablet 4  mg  4 mg Oral Q8H PRN [name], NP   4 mg at 12/13/12 0251  . pantoprazole (PROTONIX) EC tablet 40 mg  40 mg Oral Daily Himabindu Ravi, MD   40 mg at 12/15/12 0830  . polyethylene glycol (MIRALAX / GLYCOLAX) packet 17 g  17 g Oral Daily [name], NP      . sertraline (ZOLOFT) tablet 50 mg  50 mg Oral Daily Himabindu Ravi, MD   50 mg at 12/15/12 0830  . simvastatin (ZOCOR) tablet 20 mg  20 mg Oral q1800 [name], MD   20 mg at 12/14/12 1814  . traZODone (DESYREL) tablet 100 mg  100 mg Oral QHS,MR X 1 [name], NP   100 mg at 12/14/12 2115    Lab Results: No results found for this or any previous visit (from the past 48  hour(s)).  Physical Findings: AIMS: Facial and Oral Movements Muscles of Facial Expression: None, normal Lips and Perioral Area: None, normal Jaw: None, normal Tongue: None, normal,Extremity Movements Upper (arms, wrists, hands, fingers): None, normal Lower (legs, knees, ankles, toes): None, normal, Trunk Movements Neck, shoulders, hips: None, normal, Overall Severity Severity of abnormal movements (highest score from questions above): None, normal Incapacitation due to abnormal movements: None, normal Patient's awareness of abnormal movements (rate only patient's report): No Awareness, Dental Status Current problems with teeth and/or dentures?: No Does patient usually wear dentures?: No  CIWA:  CIWA-Ar Total: 0  COWS:  COWS Total Score: 2   Plan:  Review of chart, vital signs, medications, and notes. 1-Individual and group therapy 2-Medication management for depression and anxiety:  Medications reviewed with the patient and colace BID ordered with miralax for constipation 3-Coping skills for depression, anxiety 4-Continue crisis stabilization and management 5-Supportive environment to optimize care:  Patient has not had an xanax since yesterday morning and anxiety low right now, discharge will be reviewed tomorrow morning 6-Treatment plan in progress to prevent relapse of depression and anxiety  Medical Decision Making Problem Points:  Established problem, stable/improving (1) and Review of psycho-social stressors (1) Data Points:  Review of new medications or change in dosage (2)  I certify that inpatient services furnished can reasonably be expected to improve the patient's condition.   [name], PMH-NP 12/15/2012, 2:36 PM

## 2012-12-15 NOTE — Progress Notes (Signed)
[facility] Group Notes:  (Counselor/Nursing/MHT/Case Management/Adjunct)  12/15/2012 2:40 AM  Type of Therapy:  Psychoeducational Skills  Participation Level:  Active  Participation Quality:  Appropriate  Affect:  Appropriate  Cognitive:  Appropriate  Insight:  Engaged  Engagement in Group:  Engaged  Engagement in Therapy:  Engaged  Modes of Intervention:  Education  Summary of Progress/Problems: The patient mentioned that she experienced a great deal of pain earlier in the day, ie. Neck, back.  She did state however that she had a good visit with her husband. In addition, she mentioned that her appetite increased somewhat today. Her goal is to attend all of her groups and to increase her appetite.    [name] S 12/15/2012, 2:40 AM

## 2012-12-16 MED ORDER — MIRTAZAPINE 15 MG PO TABS: 15.0000 mg | ORAL_TABLET | Freq: Every day | ORAL | Status: DC

## 2012-12-16 MED ORDER — MULTI-VITAMIN/MINERALS PO TABS: 1.0000 | ORAL_TABLET | Freq: Every day | ORAL | Status: DC

## 2012-12-16 MED ORDER — DSS 100 MG PO CAPS: 100.0000 mg | ORAL_CAPSULE | Freq: Two times a day (BID) | ORAL | Status: DC

## 2012-12-16 MED ORDER — CALCIUM CARBONATE-VITAMIN D 500-200 MG-UNIT PO TABS: 1.0000 | ORAL_TABLET | Freq: Every day | ORAL | Status: DC

## 2012-12-16 MED ORDER — PANTOPRAZOLE SODIUM 40 MG PO TBEC: 40.0000 mg | DELAYED_RELEASE_TABLET | Freq: Every day | ORAL | Status: DC

## 2012-12-16 MED ORDER — SERTRALINE HCL 50 MG PO TABS: 50.0000 mg | ORAL_TABLET | Freq: Every day | ORAL | Status: DC

## 2012-12-16 MED ORDER — SIMVASTATIN 20 MG PO TABS: 20.0000 mg | ORAL_TABLET | Freq: Every day | ORAL | Status: DC

## 2012-12-16 MED ORDER — POLYETHYLENE GLYCOL 3350 17 G PO PACK: 17.0000 g | PACK | Freq: Every day | ORAL | Status: DC

## 2012-12-16 MED ORDER — HYDROXYZINE HCL 25 MG PO TABS: 25.0000 mg | ORAL_TABLET | ORAL | Status: DC | PRN

## 2012-12-16 MED ORDER — CLONIDINE HCL 0.1 MG PO TABS: 0.1000 mg | ORAL_TABLET | Freq: Two times a day (BID) | ORAL | Status: DC

## 2012-12-16 NOTE — Discharge Summary (Signed)
Physician Discharge Summary Note  Patient:  Angelica Robertson is an 67 y.o., female MRN:  [id number] DOB:  01-Apr-1946 Patient phone:  [phone] (home)  Patient address:   [address] [place] [address],   Date of Admission:  12/09/2012 Date of Discharge: 12/16/2012  Reason for Admission:  Depression and anxiety  Discharge Diagnoses: Active Problems:  * No active hospital problems. *   Review of Systems  Constitutional: Negative.   HENT: Negative.   Eyes: Negative.   Respiratory: Negative.   Cardiovascular: Negative.   Gastrointestinal: Negative.   Genitourinary: Negative.   Musculoskeletal: Negative.   Skin: Negative.   Neurological: Negative.   Endo/Heme/Allergies: Negative.   Psychiatric/Behavioral: Negative.    Axis Diagnosis:   AXIS I:  Depressive Disorder NOS and Generalized Anxiety Disorder AXIS II:  Deferred AXIS III:   Past Medical History  Diagnosis Date  . Depression 2000  . Hypercholesterolemia Osteoporosis  . Heart murmur    AXIS IV:  problems related to social environment AXIS V:  61-70 mild symptoms  Level of Care:  OP  Hospital Course:   1-Individual and group therapy attended 2-Medication management for depression and anxiety:  Medications reviewed with the patient and she did not sleep well last night, trazodone discontinued and Remeron 15 mg ordered to assist with depression, insomnia, and increase appetite.  Colace and Miralax effective and Rx given at discharge. 3-Coping skills for depression, anxiety, and pain 4-Crisis stabilization and management completed, patient denies suicidal/homicidal ideations and auditory/visual hallucinations, continues to have some anxiety with low level of depression (1-2/10); anxiety has improved and benzodiazepine taper completed, vistaril in place for anxiety. 5-Address health issues--monitoring blood pressures (clonidine started to assist with elevated blood pressures and also decrease  anxiety)--stable 6-Treatment plan in place to prevent relapse of depression and anxiety; family supportive, patient is stable for discharge with follow-up appointment in place, Rx given 7-Psychosocial education regarding relapse of depression and self-care  Consults:  None  Significant Diagnostic Studies:  labs: Completed and reviewed, stable  Discharge Vitals:   Blood pressure 132/76, pulse 108, temperature 98.1 F (36.7 C), temperature source Oral, resp. rate 18, height 5\' 4"  (1.626 m), weight 57.153 kg (126 lb), last menstrual period 11/27/1979. Body mass index is 21.63 kg/(m^2). Lab Results:   No results found for this or any previous visit (from the past 72 hour(s)).  Physical Findings: AIMS: Facial and Oral Movements Muscles of Facial Expression: None, normal Lips and Perioral Area: None, normal Jaw: None, normal Tongue: None, normal,Extremity Movements Upper (arms, wrists, hands, fingers): None, normal Lower (legs, knees, ankles, toes): None, normal, Trunk Movements Neck, shoulders, hips: None, normal, Overall Severity Severity of abnormal movements (highest score from questions above): None, normal Incapacitation due to abnormal movements: None, normal Patient's awareness of abnormal movements (rate only patient's report): No Awareness, Dental Status Current problems with teeth and/or dentures?: No Does patient usually wear dentures?: No  CIWA:  CIWA-Ar Total: 0  COWS:  COWS Total Score: 2   Psychiatric Specialty Exam: See Psychiatric Specialty Exam and Suicide Risk Assessment completed by Attending Physician prior to discharge.  Discharge destination:  Home  Is patient on multiple antipsychotic therapies at discharge:  No   Has Patient had three or more failed trials of antipsychotic monotherapy by history:  No Recommended Plan for Multiple Antipsychotic Therapies:  N/A  Discharge Orders    Future Appointments: Provider: Department: Dept Phone: Center:   12/24/2012  3:00 PM Charm Barges, MD [facility] [phone] None     Future Orders Please Complete By Expires   Diet - low sodium heart healthy      Activity as tolerated - No restrictions          Medication List     As of 12/16/2012 11:34 AM    STOP taking these medications         ALPRAZolam 0.5 MG tablet   Commonly known as: XANAX      buPROPion 300 MG 24 hr tablet   Commonly known as: WELLBUTRIN XL      QUEtiapine 25 MG tablet   Commonly known as: SEROQUEL      zolpidem 12.5 MG CR tablet   Commonly known as: AMBIEN CR      TAKE these medications      Indication    calcium-vitamin D 500-200 MG-UNIT per tablet   Commonly known as: OSCAL WITH D   Take 1 tablet by mouth daily.    Indication: Low Amount of Calcium in the Blood      cloNIDine 0.1 MG tablet   Commonly known as: CATAPRES   Take 1 tablet (0.1 mg total) by mouth 2 (two) times daily.    Indication: High Blood Pressure      DSS 100 MG Caps   Take 100 mg by mouth 2 (two) times daily.    Indication: Constipation      hydrOXYzine 25 MG tablet   Commonly known as: ATARAX/VISTARIL   Take 1 tablet (25 mg total) by mouth every 4 (four) hours as needed for anxiety.       mirtazapine 15 MG tablet   Commonly known as: REMERON   Take 1 tablet (15 mg total) by mouth at bedtime.    Indication: Trouble Sleeping      multivitamin with minerals tablet   Take 1 tablet by mouth daily.    Indication: vitamin deficiency      pantoprazole 40 MG tablet   Commonly known as: PROTONIX   Take 1 tablet (40 mg total) by mouth daily.    Indication: Gastroesophageal Reflux Disease      polyethylene glycol packet   Commonly known as: MIRALAX / GLYCOLAX   Take 17 g by mouth daily.    Indication: Constipation      sertraline 50 MG tablet   Commonly known as: ZOLOFT   Take 1 tablet (50 mg total) by mouth daily.    Indication: Major Depressive Disorder      simvastatin 20 MG tablet   Commonly known as: ZOCOR    Take 1 tablet (20 mg total) by mouth daily.    Indication: hyperlipidemia           Follow-up Information    Follow up with Francie Massing, MD. On 12/31/2012. (You are scheduled with Dr. Bethanie Dicker on Tuesday, December 31, 2012 at 1:00 PM)    Contact information:   [address] [place], [place]  [address]  [phone]         Follow-up recommendations:  Activity as tolerated, low-sodium heart healthy diet  Comments:  Patient will discharge to her husband and follow-up with a new psychiatrist.  Total Discharge Time:  Greater than 30 minutes  Signed: [name], PMH-NP 12/16/2012, 11:34 AM

## 2012-12-16 NOTE — [facility] Suicide Risk Assessment (Signed)
Suicide Risk Assessment  Discharge Assessment     Demographic Factors:  Female, Caucasian  Mental Status Per Nursing Assessment::   On Admission:  Self-harm thoughts  Current Mental Status by Physician: Patient is alert and oriented to 4. Patient denies AH/VH/SI/HI.  Loss Factors: NA  Historical Factors: Impulsivity  Risk Reduction Factors:   Sense of responsibility to family, Positive social support, Positive therapeutic relationship and Positive coping skills or problem solving skills  Continued Clinical Symptoms:  Depression:   Recent sense of peace/wellbeing  Cognitive Features That Contribute To Risk:  Cognitively intact  Suicide Risk:  Minimal: No identifiable suicidal ideation.  Patients presenting with no risk factors but with morbid ruminations; may be classified as minimal risk based on the severity of the depressive symptoms  Discharge Diagnoses:   AXIS I:  Depressive Disorder NOS AXIS II:  No diagnosis AXIS III:   Past Medical History  Diagnosis Date  . Depression 2000  . Hypercholesterolemia Osteoporosis  . Heart murmur    AXIS IV:  other psychosocial or environmental problems AXIS V:  61-70 mild symptoms  Plan Of Care/Follow-up recommendations:  Activity:  Normal Diet:  Normal Follow up with outpatient appointments.  Is patient on multiple antipsychotic therapies at discharge:  No   Has Patient had three or more failed trials of antipsychotic monotherapy by history:  No  Recommended Plan for Multiple Antipsychotic Therapies: NA  Tabbatha Bordelon 12/16/2012, 10:28 AM

## 2012-12-16 NOTE — Tx Team (Signed)
Interdisciplinary Treatment Plan Update (Adult)  Date:  12/16/2012  Time Reviewed:  10:26 AM   Progress in Treatment: Attending groups:   Yes   Participating in groups:  Yes Taking medication as prescribed:  Yes Tolerating medication:  Yes Family/Significant othe contact made: Contact made with family Patient understands diagnosis:  Yes Discussing patient identified problems/goals with staff: Yes Medical problems stabilized or resolved: Yes Denies suicidal/homicidal ideation:Yes Issues/concerns per patient self-inventory:  Other:   New problem(s) identified:  Reason for Continuation of Hospitalization:  Interventions implemented related to continuation of hospitalization:  Additional comments:  Estimated length of stay: Discharge home today  Discharge Plan:  Home with outpatient follow up  New goal(s):  Review of initial/current patient goals per problem list:    1.  Goal(s): Eliminate SI/other thoughts of self harm   Met:  Yes  Target date: d/c  As evidenced by: Patient no longer endorsing SI/HI or other thoughts of self harm.    2.  Goal (s):Reduce depression/anxiety  Met: Yes  Target date: d/c  As evidenced by: Patient currently rating symptoms at four or below    3.  Goal(s):.stabilize on meds   Met:  Yes  Target date: d/c  As evidenced by: Patient reports being stabilized on medications - less symptomatic    4.  Goal(s): Refer for outpatient follow up   Met:  Yes  Target date: d/c  As evidenced by: Follow up appointment scheduled    Attendees: Patient:  Angelica Robertson 12/16/2012 10:26 AM  Physican:  Patrick North, MD 12/16/2012 10:26 AM  Nursing:  Dellia Cloud , RN 12/16/2012 10:26 AM   Nursing:  Quintella Reichert , RN 12/16/2012 10:26 AM   Clinical Social Worker:  Juline Patch, LCSW 12/16/2012 10:26 AM   Other: Tera Helper, PHM-NP 12/16/2012 10:26 AM   Other:         12/16/2012 10:26 AM Other:        12/16/2012 10:26 AM

## 2012-12-16 NOTE — Progress Notes (Signed)
Discharge Note:  Patient discharged home with her husband.   Denied SI and HI.   Denied A/V hallucinations.   Denied pain.  Patient received her prescriptions, all her belongings, and discharge instructions which she stated she understood and had no questions.  Patient received suicide prevention information which she also stated she understood.  Received all her belongings in locker #2.  Patient stated she appreciated all the staff at [facility] has done to assist her.  Patient has been cooperative and pleasant.

## 2012-12-16 NOTE — Progress Notes (Signed)
I agree with this note.  

## 2012-12-16 NOTE — Clinical Social Work Note (Signed)
[facility] LCSW Group Therapy            12/16/2012 3:59 PM    Type of Therapy:  Group Therapy  Participation Level:  Appropriate  Participation Quality:  Appropriate  Affect:  Appropriate  Cognitive:  Attentive Appropriate  Insight:  Engaged  Engagement in Therapy:  Engaged  Modes of Intervention:  Discussion Exploration Problem-Solving Supportive  Summary of Progress/Problems:  Patient shared the obstacle she needs to overcome is isolation.  Patient stated she had stopped communicating with friends but plans to reach out to friends when she returns home.  Wynn Banker 12/16/2012 3:59 PM

## 2012-12-16 NOTE — Progress Notes (Signed)
[facility] Group Notes:  (Counselor/Nursing/MHT/Case Management/Adjunct)  12/16/2012 12:56 AM  Type of Therapy:  Psychoeducational Skills  Participation Level:  Active  Participation Quality:  Appropriate  Affect:  Appropriate  Cognitive:  Appropriate  Insight:  Engaged  Engagement in Group:  Improving  Engagement in Therapy:  Improving  Modes of Intervention:  Education  Summary of Progress/Problems:The patient stated that she had a good day. She mentioned that she enjoyed the groups that were held earlier in the day. In addition, she verbalized that she had a good visit with her husband and son. Her goal for tomorrow is to speak with her case manager about setting up a follow-up appointment closer to her outpatient doctor's appointment (third week of January ?).    Tosh Glaze S 12/16/2012, 12:56 AM

## 2012-12-16 NOTE — Progress Notes (Addendum)
D:  Patient's self inventory, patient has fair sleep, improving appetite, normal energy level, good attention span.  Rated depression #3, hopelessness #1.  Denied withdrawals.  Denied SI.  Physical problems in past 24 hours, pain in hips and neck, headache.  Worst pain #2.  After discharge, plans to continue to eat three meals a day.  Be more active.  Don't isolate myself, spend time with family and friends.  No questions for staff.  Not sure where she will go after discharge.  No problems taking meds after discharge. A:  Medications administered per MD order.  Support and encouragement given throughout day.  Support and safety checks completed as ordered. R:  Following treatment plan.  Denied SI and HI.  Denied A/V hallucinations.  Contracts for safety.  Patient remains safe and receptive on unit. Patient attended groups today and participated in groups.

## 2012-12-16 NOTE — Progress Notes (Signed)
[facility] Adult Case Management Discharge Plan :  Will you be returning to the same living situation after discharge: Yes,  Paitent will return home with husband At discharge, do you have transportation home?:Yes,  Patient's husband to transport her home Do you have the ability to pay for your medications:Yes,  Patient can afford medications  Release of information consent forms completed and in the chart;  Patient's signature needed at discharge.  Patient to Follow up at: Follow-up Information    Follow up with Francie Massing, MD. On 12/31/2012. (You are scheduled with Dr. Bethanie Dicker on Tuesday, December 31, 2012 at 1:00 PM)    Contact information:   [address] [place], [place]  [address]  [phone]         Patient denies SI/HI:   Yes,  Patient no longer endorsing SI/HI or thoughts of self harm    Safety Planning and Suicide Prevention discussed:  Yes,  Reviewed during after care group.  Angelica Robertson 12/16/2012, 10:30 AM

## 2012-12-17 ENCOUNTER — Ambulatory Visit: Payer: BC Managed Care – PPO | Admitting: Cardiovascular Disease

## 2012-12-17 NOTE — Discharge Summary (Signed)
Reviewed

## 2012-12-18 NOTE — Progress Notes (Signed)
Patient Discharge Instructions:  After Visit Summary (AVS):   Faxed to:  12/18/12 Psychiatric Admission Assessment Note:   Faxed to:  12/18/12 Suicide Risk Assessment - Discharge Assessment:   Faxed to:  12/18/12 Faxed/Sent to the Next Level Care provider:  12/18/12 Faxed to Caryn Section, MD @ [phone]  Jerelene Redden, 12/18/2012, 3:45 PM

## 2012-12-19 ENCOUNTER — Telehealth: Payer: Self-pay | Admitting: Internal Medicine

## 2012-12-19 NOTE — Telephone Encounter (Signed)
Patient stating that she would wait to see the doctor for her appointment on 12/24/12

## 2012-12-19 NOTE — Telephone Encounter (Signed)
I can see her tomorrow at 10:00 - will need to block 30 min (pt just admitted).  Having issues.  If she needs eval today - can see if Raquel can see.  Let me know if problems.

## 2012-12-19 NOTE — Telephone Encounter (Signed)
Patient Information:  Caller Name: Radhika  Phone: [phone]  Patient: Angelica Robertson, Angelica Robertson  Gender: Female  DOB: Apr 10, 1946  Age: 67 Years  PCP: Dale Robinson  Office Follow Up:  Does the office need to follow up with this patient?: Yes  Instructions For The Office: Patient weak. Recent admission and medication changes. Triages to be seen today. Dr. Lorin Picket has no appt. Please contact patient for appt/follow up.  RN Note:  Patient concerned with medication changes. Ongoing weakness.  Symptoms  Reason For Call & Symptoms: Patient admitted on 12/09/12 to [facility]. She was discharged  on Monday night. She feels like she may be having withdrawal or side effects from medications  (1) she was started on Catapress for HTN (2) started on Vistaril and stopped Xanax (3) Stopped Wellbutrin and placed on Zoloft 50mg   (4) Stopped Ambien and Seroquel for sleep and placed on Remeron 15mg .    She states she is weak in the morning better as day goes on. Slightly dizzy.  B/P fluctuates- normal in the morning 90/60  and 136/80  then during day 160's.  Bottom number in 80's per patient, decreased appetite.   CONCERNED . She has appt on Tuesday  12/24/12  Reviewed Health History In EMR: Yes  Reviewed Medications In EMR: Yes  Reviewed Allergies In EMR: Yes  Reviewed Surgeries / Procedures: No  Date of Onset of Symptoms: 12/16/2012  Guideline(s) Used:  Weakness (Generalized) and Fatigue  Disposition Per Guideline:   See Today in Office  Reason For Disposition Reached:   Moderate weakness (i.e., interferes with work, school, normal activities) and persists > 3 days  Advice Given:  Reassurance  Not drinking enough fluids and being a little dehydrated is a common cause of mild weakness.  Fluids  : Drink several glasses of fruit juice, other clear fluids, or water. This will improve hydration and blood glucose.  Fluids  : Drink several glasses of fruit juice, other clear fluids, or water. This  will improve hydration and blood glucose.  Rest  : Lie down with feet elevated for 1 hour. This will improve blood flow and increase blood flow to the brain.  Rest  : Lie down with feet elevated for 1 hour. This will improve blood flow and increase blood flow to the brain.  Call Back If:   Still feeling weak after 2 hours of rest and fluids  Passes out (faints)  You become worse.

## 2012-12-24 ENCOUNTER — Ambulatory Visit (INDEPENDENT_AMBULATORY_CARE_PROVIDER_SITE_OTHER): Payer: BC Managed Care – PPO | Admitting: Internal Medicine

## 2012-12-24 ENCOUNTER — Encounter: Payer: Self-pay | Admitting: Internal Medicine

## 2012-12-24 VITALS — BP 130/70 | HR 95 | Temp 98.4°F | Ht 64.0 in | Wt 127.2 lb

## 2012-12-24 DIAGNOSIS — F329 Major depressive disorder, single episode, unspecified: Secondary | ICD-10-CM

## 2012-12-29 ENCOUNTER — Encounter: Payer: Self-pay | Admitting: Internal Medicine

## 2012-12-29 NOTE — Assessment & Plan Note (Signed)
Due to see Dr Maryruth Bun next week.  Doing better.  Follow.

## 2012-12-29 NOTE — Progress Notes (Signed)
Subjective:    Patient ID: Angelica Robertson, female    DOB: 03-Sep-1946, 67 y.o.   MRN: [id number]  Sore Throat   67 year old female with past history of depression and hypercholesterolemia who comes in today for a scheduled follow up.  States she had a colonoscopy 08/20/12.  One week after the colonoscopy, she developed headache and decreased energy.  She has had problems since that time with increased depression.  Was seeing Dr Neale Burly.  Was recently hospitalized at [facility] and medication was adjusted.  She is now off seroquel, wellbutrin, ambien and xanax.  She is currently on Zoloft and Remeron.  Feels better regarding the depression.  She is due to see Dr Maryruth Bun next week.  While in the hospital, she had issues with her blood pressure being elevated.  She was placed on clonidine bid.  She continues to take.  Blood pressures recently averaging 120-140/60-70.  She also is noticing some increased drainage and some minimal cough.  No fever.       Past Medical History  Diagnosis Date  . Depression 2000  . Hypercholesterolemia Osteoporosis  . Heart murmur     Current Outpatient Prescriptions on File Prior to Visit  Medication Sig Dispense Refill  . calcium-vitamin D (OSCAL WITH D) 500-200 MG-UNIT per tablet Take 1 tablet by mouth daily.  30 tablet  0  . cloNIDine (CATAPRES) 0.1 MG tablet Take 1 tablet (0.1 mg total) by mouth 2 (two) times daily.  60 tablet  2  . docusate sodium 100 MG CAPS Take 100 mg by mouth 2 (two) times daily.  60 capsule  0  . hydrOXYzine (ATARAX/VISTARIL) 25 MG tablet Take 1 tablet (25 mg total) by mouth every 4 (four) hours as needed for anxiety.  60 tablet  0  . mirtazapine (REMERON) 15 MG tablet Take 1 tablet (15 mg total) by mouth at bedtime.  30 tablet  0  . Multiple Vitamins-Minerals (MULTIVITAMIN WITH MINERALS) tablet Take 1 tablet by mouth daily.  30 tablet  0  . pantoprazole (PROTONIX) 40 MG tablet Take 1 tablet (40 mg total) by mouth daily.  30 tablet  2  .  sertraline (ZOLOFT) 50 MG tablet Take 1 tablet (50 mg total) by mouth daily.  30 tablet  0  . simvastatin (ZOCOR) 20 MG tablet Take 1 tablet (20 mg total) by mouth daily.  30 tablet  0  . polyethylene glycol (MIRALAX / GLYCOLAX) packet Take 17 g by mouth daily.  30 each  0    Review of Systems Patient denies any lightheadedness or dizziness.  No problem with headaches reported.  Minimal increased drainage.  No increased shortness of breath, cough or congestion.  On Protonix for the acid reflux.  No nausea or vomiting.  Eating better.   No abdominal pain or cramping.  No bowel change, such as diarrhea, BRBPR or melana.  She does report her bowels not as regular.  No urine change.  Feels better - regarding the depression.  She states she has decreased energy and increased fatigue.      Objective:   Physical Exam  Filed Vitals:   12/24/12 1449  BP: 130/70  Pulse: 95  Temp: 98.4 F (66.54 C)   67 year old female in no acute distress.   HEENT:  Nares - clear.  OP- without lesions or erythema.  NECK:  Supple, nontender.  No audible bruit.  HEART:  Appears to be regular. LUNGS:  Without crackles or wheezing audible.  Respirations even and unlabored.   RADIAL PULSE:  Equal bilaterally.  ABDOMEN:  Soft, nontender.  No audible abdominal bruit.   EXTREMITIES:  No increased edema to be present.                   Assessment & Plan:  GI.  On Protonix.  Appears to be doing well.  Continue for now.  Follow.   CARDIOVASCULAR.  Recent stress test negative for ischemia.  Symptoms stable now.  Saw cardiology.  Recommended no further w/up.  Follow.    FATIGUE.  Probably multifactorial.  Just out of the hospital.  Medications are being adjusted.  Also she is on the Clonidine.  Could be contributing.  Will have her continue clonidine for now.  Follow pressures.  If continues to need medication once above leveled off, then will change to a different blood pressure medication.  Follow.    HEALTH  MAINTENANCE.  Mammogram - 03/2012.  Colonoscopy 08/20/12.

## 2013-01-07 ENCOUNTER — Encounter: Payer: Self-pay | Admitting: Internal Medicine

## 2013-01-07 ENCOUNTER — Ambulatory Visit (INDEPENDENT_AMBULATORY_CARE_PROVIDER_SITE_OTHER): Payer: BC Managed Care – PPO | Admitting: Internal Medicine

## 2013-01-07 VITALS — BP 138/80 | HR 91 | Temp 98.7°F | Ht 64.0 in | Wt 131.5 lb

## 2013-01-07 DIAGNOSIS — E7800 Pure hypercholesterolemia, unspecified: Secondary | ICD-10-CM

## 2013-01-07 DIAGNOSIS — F329 Major depressive disorder, single episode, unspecified: Secondary | ICD-10-CM

## 2013-01-07 DIAGNOSIS — Z23 Encounter for immunization: Secondary | ICD-10-CM

## 2013-01-08 ENCOUNTER — Encounter: Payer: Self-pay | Admitting: Internal Medicine

## 2013-01-08 NOTE — Assessment & Plan Note (Signed)
Doing better.  Seeing Dr Maryruth Bun.  Follow.

## 2013-01-08 NOTE — Assessment & Plan Note (Signed)
Low cholesterol diet.  Will follow.  On simvastatin.

## 2013-01-08 NOTE — Progress Notes (Signed)
Subjective:    Patient ID: Angelica Robertson, female    DOB: 12-22-1945, 67 y.o.   MRN: [id number]  Sore Throat   67 year old female with past history of depression and hypercholesterolemia who comes in today for a scheduled follow up.  States she had a colonoscopy 08/20/12.  One week after the colonoscopy, she developed headache and decreased energy.  She has had problems since that time with increased depression.  Was seeing Dr Neale Burly.  Was recently hospitalized at [facility] and medication was adjusted.  She is now off seroquel, wellbutrin, ambien and xanax.  She is currently on Zoloft and Remeron.  Feels better regarding the depression.  Saw Dr Maryruth Bun last week.  While in the hospital, she had issues with her blood pressure being elevated.  She was placed on clonidine bid.  She continues to take.  Blood pressures recently averaging 110-130s/60-70.  She reports that in the am she feels a little dizzy.  She has also noticed some night sweats.  No hot flashes during the day.  No chest pain or tightness.  No sob.  She is eating better.  Gaining weight.        Past Medical History  Diagnosis Date  . Depression 2000  . Hypercholesterolemia Osteoporosis  . Heart murmur     Current Outpatient Prescriptions on File Prior to Visit  Medication Sig Dispense Refill  . calcium-vitamin D (OSCAL WITH D) 500-200 MG-UNIT per tablet Take 1 tablet by mouth daily.  30 tablet  0  . cloNIDine (CATAPRES) 0.1 MG tablet Take 1 tablet (0.1 mg total) by mouth 2 (two) times daily.  60 tablet  2  . docusate sodium 100 MG CAPS Take 100 mg by mouth 2 (two) times daily.  60 capsule  0  . mirtazapine (REMERON) 15 MG tablet Take 1 tablet (15 mg total) by mouth at bedtime.  30 tablet  0  . Multiple Vitamins-Minerals (MULTIVITAMIN WITH MINERALS) tablet Take 1 tablet by mouth daily.  30 tablet  0  . pantoprazole (PROTONIX) 40 MG tablet Take 1 tablet (40 mg total) by mouth daily.  30 tablet  2  . polyethylene glycol (MIRALAX /  GLYCOLAX) packet Take 17 g by mouth daily.  30 each  0  . sertraline (ZOLOFT) 50 MG tablet Take 1 tablet (50 mg total) by mouth daily.  30 tablet  0  . simvastatin (ZOCOR) 20 MG tablet Take 1 tablet (20 mg total) by mouth daily.  30 tablet  0    Review of Systems Patient denies any headache.  Does report minimal light headedness in the am as outlined.  Minimal increased drainage.  No increased shortness of breath, cough or congestion.  On Protonix for the acid reflux.  No nausea or vomiting.  Eating better.   No abdominal pain or cramping.  No bowel change, such as diarrhea, BRBPR or melana.  No urine change.  Feels better - regarding the depression.  Blood pressure as outlined.       Objective:   Physical Exam  Filed Vitals:   01/07/13 1107  BP: 138/80  Pulse: 91  Temp: 98.7 F (79.71 C)   67 year old female in no acute distress.   HEENT:  Nares - clear.  OP- without lesions or erythema.  NECK:  Supple, nontender.  No audible bruit.  HEART:  Appears to be regular. LUNGS:  Without crackles or wheezing audible.  Respirations even and unlabored.   RADIAL PULSE:  Equal bilaterally.  ABDOMEN:  Soft, nontender.  No audible abdominal bruit.   EXTREMITIES:  No increased edema to be present.                   Assessment & Plan:  GI.  On Protonix.  Appears to be doing well.  Continue for now.  Follow.   CARDIOVASCULAR.  Recent stress test negative for ischemia.  Symptoms stable now.  Saw cardiology.  Recommended no further w/up.  Follow.    LIGHT HEADEDNESS.  Notices in the am.  No other associated sx.  Question if related to the clonidine.  Will taper off the clonidine.  Instructions on how to taper - given to the patient.  Follow pressures.  Notify me if symptoms do not resolve.    NIGHT SWEATS.  May be related to the clonidine.  Started after initiation of the medication.  Will taper the meds as outlined.  Follow.  Notify me if persistent.      HEALTH MAINTENANCE.  Mammogram - 03/2012.   Colonoscopy 08/20/12.

## 2013-02-20 ENCOUNTER — Ambulatory Visit (INDEPENDENT_AMBULATORY_CARE_PROVIDER_SITE_OTHER): Payer: BC Managed Care – PPO | Admitting: Internal Medicine

## 2013-02-20 ENCOUNTER — Encounter: Payer: Self-pay | Admitting: Internal Medicine

## 2013-02-20 VITALS — BP 130/80 | HR 81 | Temp 98.4°F | Ht 64.0 in | Wt 138.0 lb

## 2013-02-20 DIAGNOSIS — E7800 Pure hypercholesterolemia, unspecified: Secondary | ICD-10-CM

## 2013-02-20 DIAGNOSIS — Z1239 Encounter for other screening for malignant neoplasm of breast: Secondary | ICD-10-CM

## 2013-02-21 ENCOUNTER — Encounter: Payer: Self-pay | Admitting: Internal Medicine

## 2013-02-21 NOTE — Assessment & Plan Note (Signed)
Doing better.  Seeing Dr Maryruth Bun.  Off remeron.  Only on Zoloft now.  Follow.

## 2013-02-21 NOTE — Assessment & Plan Note (Signed)
Low cholesterol diet.  Will follow.  On simvastatin.  

## 2013-02-21 NOTE — Progress Notes (Signed)
Subjective:    Patient ID: Angelica Robertson, female    DOB: 06-21-1946, 67 y.o.   MRN: [id number]  Sore Throat   67 year old female with past history of depression and hypercholesterolemia who comes in today for a scheduled follow up.  States she had a colonoscopy 08/20/12.  One week after the colonoscopy, she developed headache and decreased energy.  She has had problems since that time with increased depression.  Was seeing Dr Neale Burly.  Was recently hospitalized at [facility] and medication was adjusted.  She is now off seroquel, wellbutrin, ambien and xanax.  She is currently on Zoloft.  Seeing Dr Maryruth Bun.  She has recently been able to come off Remeron.  Feels better.  Is back to exercising.  Off clonidine.  Blood pressure on outside checks averaging - 120-130s/70-80s.  Overall she feels good.        Past Medical History  Diagnosis Date  . Depression 2000  . Hypercholesterolemia Osteoporosis  . Heart murmur     Current Outpatient Prescriptions on File Prior to Visit  Medication Sig Dispense Refill  . calcium-vitamin D (OSCAL WITH D) 500-200 MG-UNIT per tablet Take 1 tablet by mouth daily.  30 tablet  0  . docusate sodium 100 MG CAPS Take 100 mg by mouth 2 (two) times daily.  60 capsule  0  . Multiple Vitamins-Minerals (MULTIVITAMIN WITH MINERALS) tablet Take 1 tablet by mouth daily.  30 tablet  0  . pantoprazole (PROTONIX) 40 MG tablet Take 1 tablet (40 mg total) by mouth daily.  30 tablet  2  . polyethylene glycol (MIRALAX / GLYCOLAX) packet Take 17 g by mouth daily.  30 each  0  . sertraline (ZOLOFT) 50 MG tablet Take 1 tablet (50 mg total) by mouth daily.  30 tablet  0  . simvastatin (ZOCOR) 20 MG tablet Take 1 tablet (20 mg total) by mouth daily.  30 tablet  0  . cloNIDine (CATAPRES) 0.1 MG tablet Take 1 tablet (0.1 mg total) by mouth 2 (two) times daily.  60 tablet  2  . mirtazapine (REMERON) 15 MG tablet Take 1 tablet (15 mg total) by mouth at bedtime.  30 tablet  0   No current  facility-administered medications on file prior to visit.    Review of Systems Patient denies any headache, lightheadedness or dizziness.  No sinus or allergy symptoms.  No chest pain, tightness or palpitations.  No increased shortness of breath, cough or congestion.  No nausea or vomiting.  No acid reflux.  Is going to try a trial off protonix.  No abdominal pain or cramping.  No bowel change, such as diarrhea, constipation, BRBPR or melana.  No urine change.           Objective:   Physical Exam  Filed Vitals:   02/20/13 1607  BP: 130/80  Pulse: 81  Temp: 98.4 F (36.9 C)   Blood pressure recheck:  24/79  67 year old female in no acute distress.   HEENT:  Nares - clear.  OP- without lesions or erythema.  NECK:  Supple, nontender.  No audible bruit.  HEART:  Appears to be regular. LUNGS:  Without crackles or wheezing audible.  Respirations even and unlabored.   RADIAL PULSE:  Equal bilaterally.  ABDOMEN:  Soft, nontender.  No audible abdominal bruit.   EXTREMITIES:  No increased edema to be present.  Assessment & Plan:  GI.  On Protonix.  Appears to be doing well.  No symptoms.  Will try a trial off and see how she does off the medication.  Follow.    CARDIOVASCULAR.  Recent stress test negative for ischemia.  Symptoms stable now.  Saw cardiology.  Recommended no further w/up.  Follow.    LIGHT HEADEDNESS.  Not an issue for her now.  Off clonidine.        HEALTH MAINTENANCE.  Mammogram - 03/2012.  Colonoscopy 08/20/12.  Schedule a physical for next visit.

## 2013-04-04 ENCOUNTER — Ambulatory Visit: Payer: Self-pay | Admitting: Internal Medicine

## 2013-04-05 ENCOUNTER — Encounter: Payer: Self-pay | Admitting: Internal Medicine

## 2013-04-09 ENCOUNTER — Ambulatory Visit (INDEPENDENT_AMBULATORY_CARE_PROVIDER_SITE_OTHER): Payer: BC Managed Care – PPO | Admitting: Internal Medicine

## 2013-04-09 ENCOUNTER — Encounter: Payer: Self-pay | Admitting: Internal Medicine

## 2013-04-09 VITALS — BP 120/70 | HR 76 | Temp 98.2°F | Ht 64.0 in | Wt 137.2 lb

## 2013-04-09 DIAGNOSIS — F3289 Other specified depressive episodes: Secondary | ICD-10-CM

## 2013-04-09 DIAGNOSIS — F329 Major depressive disorder, single episode, unspecified: Secondary | ICD-10-CM

## 2013-04-09 DIAGNOSIS — N390 Urinary tract infection, site not specified: Secondary | ICD-10-CM

## 2013-04-09 LAB — POCT URINALYSIS DIPSTICK
Bilirubin, UA: NEGATIVE
Ketones, UA: NEGATIVE
Nitrite, UA: NEGATIVE
Protein, UA: NEGATIVE
pH, UA: 8.5

## 2013-04-09 MED ORDER — NITROFURANTOIN MONOHYD MACRO 100 MG PO CAPS: 100.0000 mg | ORAL_CAPSULE | Freq: Two times a day (BID) | ORAL | Status: DC

## 2013-04-09 NOTE — Progress Notes (Signed)
Subjective:    Patient ID: Angelica Robertson, female    DOB: 04-Jan-1946, 67 y.o.   MRN: [id number]  Urinary Tract Infection  Associated symptoms include frequency.  Urinary Frequency  Associated symptoms include frequency.  Sore Throat   67 year old female with past history of depression and hypercholesterolemia who comes in today as a work in with concerns regarding a possible urinary infection.  States symptoms started two weeks ago.  Has been intermittently "better and then worse".  Describes increased urinary frequency.  Dysuria.  No hematuria.  No abdominal pain and no back pain.  No vaginal symptoms.  Eating and drinking well.  No nausea or vomiting.  Exercising.       Past Medical History  Diagnosis Date  . Depression 2000  . Hypercholesterolemia Osteoporosis  . Heart murmur     Current Outpatient Prescriptions on File Prior to Visit  Medication Sig Dispense Refill  . calcium-vitamin D (OSCAL WITH D) 500-200 MG-UNIT per tablet Take 1 tablet by mouth daily.  30 tablet  0  . sertraline (ZOLOFT) 50 MG tablet Take 1 tablet (50 mg total) by mouth daily.  30 tablet  0  . simvastatin (ZOCOR) 20 MG tablet Take 1 tablet (20 mg total) by mouth daily.  30 tablet  0  . cloNIDine (CATAPRES) 0.1 MG tablet Take 1 tablet (0.1 mg total) by mouth 2 (two) times daily.  60 tablet  2  . docusate sodium 100 MG CAPS Take 100 mg by mouth 2 (two) times daily.  60 capsule  0  . mirtazapine (REMERON) 15 MG tablet Take 1 tablet (15 mg total) by mouth at bedtime.  30 tablet  0  . Multiple Vitamins-Minerals (MULTIVITAMIN WITH MINERALS) tablet Take 1 tablet by mouth daily.  30 tablet  0  . pantoprazole (PROTONIX) 40 MG tablet Take 1 tablet (40 mg total) by mouth daily.  30 tablet  2  . polyethylene glycol (MIRALAX / GLYCOLAX) packet Take 17 g by mouth daily.  30 each  0   No current facility-administered medications on file prior to visit.    Review of Systems  Genitourinary: Positive for frequency.  Patient  denies any headache, lightheadedness or dizziness.  No fever.  Eating and drinking well.  No nausea or vomiting.  No abdominal pain or back pain.  No vaginal symptoms.  Does describe the dysuria and increased urinary frequency.  No hematuria.  Is exercising.  Feels good.  Back to normal.          Objective:   Physical Exam  Filed Vitals:   04/09/13 0813  BP: 120/70  Pulse: 76  Temp: 98.2 F (79.32 C)   67 year old female in no acute distress.  HEART:  Appears to be regular. LUNGS:  Without crackles or wheezing audible.  Respirations even and unlabored.  ABDOMEN:  Soft, nontender.  No audible abdominal bruit. BACK:  Non tender.  No CVA tenderness.         Assessment & Plan:  UTI.  Urine dip reveals changes c/w urinary tract infection.  Treat with macrobid 100mg  bid x 5 days.  Send for culture.  Follow. Stay hydrated.   GI.  Off protonix.    CARDIOVASCULAR.  Recent stress test negative for ischemia.  Symptoms stable now.  Saw cardiology.  Recommended no further w/up.  Follow.    LIGHT HEADEDNESS.  Not an issue for her now.  Off clonidine.        HEALTH MAINTENANCE.  Mammogram - 03/2012.  Colonoscopy 08/20/12.

## 2013-04-11 LAB — URINE CULTURE: Colony Count: 40000

## 2013-04-13 ENCOUNTER — Encounter: Payer: Self-pay | Admitting: Internal Medicine

## 2013-04-13 NOTE — Assessment & Plan Note (Signed)
Doing well.  Back to exercising.  Feels good.  Follow.

## 2013-04-16 ENCOUNTER — Telehealth: Payer: Self-pay | Admitting: *Deleted

## 2013-04-16 DIAGNOSIS — N390 Urinary tract infection, site not specified: Secondary | ICD-10-CM

## 2013-04-16 NOTE — Telephone Encounter (Signed)
Pt is coming in tomorrow 05.08.2014 for labs, is it just a urine? Or urine and blood?

## 2013-04-17 ENCOUNTER — Other Ambulatory Visit (INDEPENDENT_AMBULATORY_CARE_PROVIDER_SITE_OTHER): Payer: BC Managed Care – PPO

## 2013-04-17 DIAGNOSIS — N390 Urinary tract infection, site not specified: Secondary | ICD-10-CM

## 2013-04-17 LAB — URINALYSIS, ROUTINE W REFLEX MICROSCOPIC
Bilirubin Urine: NEGATIVE
Ketones, ur: NEGATIVE
Leukocytes, UA: NEGATIVE
Specific Gravity, Urine: <=1.005 (ref 1.000–1.030)
Urine Glucose: NEGATIVE
pH: 6.5 (ref 5.0–8.0)

## 2013-04-17 NOTE — Telephone Encounter (Signed)
Pt wasn't fasting, she only did a urine today

## 2013-04-17 NOTE — Telephone Encounter (Signed)
She definitely needs the urinalysis and culture.  I will order.  She does have labs ordered.  If she is fasting, you can go ahead and draw these.  If not fasting, then just do the urine and I will get her back in for the blood work.  Thanks.

## 2013-04-19 LAB — CULTURE, URINE COMPREHENSIVE: Organism ID, Bacteria: NO GROWTH

## 2013-04-21 ENCOUNTER — Encounter: Payer: Self-pay | Admitting: Internal Medicine

## 2013-04-28 ENCOUNTER — Encounter: Payer: Self-pay | Admitting: Internal Medicine

## 2013-05-23 ENCOUNTER — Encounter: Payer: Self-pay | Admitting: Internal Medicine

## 2013-05-23 ENCOUNTER — Ambulatory Visit (INDEPENDENT_AMBULATORY_CARE_PROVIDER_SITE_OTHER): Payer: BC Managed Care – PPO | Admitting: Internal Medicine

## 2013-05-23 VITALS — BP 120/80 | HR 73 | Temp 98.4°F | Ht 63.5 in | Wt 134.5 lb

## 2013-05-23 DIAGNOSIS — G2581 Restless legs syndrome: Secondary | ICD-10-CM

## 2013-05-23 DIAGNOSIS — F329 Major depressive disorder, single episode, unspecified: Secondary | ICD-10-CM

## 2013-05-23 DIAGNOSIS — E7800 Pure hypercholesterolemia, unspecified: Secondary | ICD-10-CM

## 2013-05-23 LAB — BASIC METABOLIC PANEL
CO2: 30 mEq/L (ref 19–32)
Calcium: 10.2 mg/dL (ref 8.4–10.5)
Chloride: 101 mEq/L (ref 96–112)
Creatinine, Ser: 0.8 mg/dL (ref 0.4–1.2)
Glucose, Bld: 90 mg/dL (ref 70–99)

## 2013-05-23 LAB — HEPATIC FUNCTION PANEL
AST: 25 U/L (ref 0–37)
Albumin: 4.2 g/dL (ref 3.5–5.2)
Alkaline Phosphatase: 56 U/L (ref 39–117)
Total Bilirubin: 0.8 mg/dL (ref 0.3–1.2)

## 2013-05-23 LAB — LIPID PANEL
LDL Cholesterol: 118 mg/dL — ABNORMAL HIGH (ref 0–99)
Total CHOL/HDL Ratio: 3
Triglycerides: 92 mg/dL (ref 0.0–149.0)

## 2013-05-23 LAB — CBC WITH DIFFERENTIAL/PLATELET
Basophils Relative: 1.3 % (ref 0.0–3.0)
Eosinophils Relative: 4.2 % (ref 0.0–5.0)
HCT: 41.2 % (ref 36.0–46.0)
Hemoglobin: 13.9 g/dL (ref 12.0–15.0)
Lymphs Abs: 1.9 10*3/uL (ref 0.7–4.0)
MCV: 89.1 fl (ref 78.0–100.0)
Monocytes Absolute: 0.6 10*3/uL (ref 0.1–1.0)
Monocytes Relative: 10 % (ref 3.0–12.0)
Neutro Abs: 3.1 10*3/uL (ref 1.4–7.7)
RBC: 4.62 Mil/uL (ref 3.87–5.11)
WBC: 6 10*3/uL (ref 4.5–10.5)

## 2013-05-23 LAB — FERRITIN: Ferritin: 120.5 ng/mL (ref 10.0–291.0)

## 2013-05-25 ENCOUNTER — Encounter: Payer: Self-pay | Admitting: Internal Medicine

## 2013-05-25 NOTE — Progress Notes (Signed)
  Subjective:    Patient ID: Angelica Robertson, female    DOB: 1946/12/03, 67 y.o.   MRN: [id number]  HPI 67 year old female with past history of depression and hypercholesterolemia who comes in today to follow up on these issues as well as for a complete physical exam. Was previously having problems with depression.  Was hospitalized for a brief period.  Seeing Dr Maryruth Bun now.  On zoloft.  Doing well.  Feels better.  Sleeping better.  Feels good.  Back to exercising.  No chest pain or tightness with increased activity or exertion.  No sob.  No acid reflux.  No bowel change.  No abdominal pain or cramping.  Does report some occasional restless legs.  Stretches have helped.         Past Medical History  Diagnosis Date  . Depression 2000  . Hypercholesterolemia Osteoporosis  . Heart murmur     Current Outpatient Prescriptions on File Prior to Visit  Medication Sig Dispense Refill  . calcium-vitamin D (OSCAL WITH D) 500-200 MG-UNIT per tablet Take 1 tablet by mouth daily.  30 tablet  0  . sertraline (ZOLOFT) 50 MG tablet Take 1 tablet (50 mg total) by mouth daily.  30 tablet  0  . simvastatin (ZOCOR) 20 MG tablet Take 1 tablet (20 mg total) by mouth daily.  30 tablet  0   No current facility-administered medications on file prior to visit.    Review of Systems Patient denies any headache, lightheadedness or dizziness. No sinus or allergy symptoms.  No chest pain, tightness or palpitations.  No increased shortness of breath, cough or congestion.  No acid reflux.  On no medication.  No nausea or vomiting.  No abdominal pain or cramping.  No bowel change, such as diarrhea, constipation, BRBPR or melana.  No urine change.  Some occasional restlessness of her legs - at night.  Stretching has helped.         Objective:   Physical Exam  Filed Vitals:   05/23/13 0830  BP: 120/80  Pulse: 73  Temp: 98.4 F (36.9 C)   Blood pressure recheck:  118/78, pulse 79  67 year old female in no acute distress.    HEENT:  Nares- clear.  Oropharynx - without lesions. NECK:  Supple.  Nontender.  No audible bruit.  HEART:  Appears to be regular. LUNGS:  No crackles or wheezing audible.  Respirations even and unlabored.  RADIAL PULSE:  Equal bilaterally.    BREASTS:  No nipple discharge or nipple retraction present.  Could not appreciate any distinct nodules or axillary adenopathy.  ABDOMEN:  Soft, nontender.  Bowel sounds present and normal.  No audible abdominal bruit.  GU:  Normal external genitalia.  Vaginal vault without lesions.  S/p hysterectomy. Could not appreciate any adnexal masses or tenderness.   RECTAL:  Heme negative.   EXTREMITIES:  No increased edema present.  DP pulses palpable and equal bilaterally.             Assessment & Plan:  NECK PAIN.  Not an issue for her now.  Follow.    CARDIOVASCULAR.  Recent stress test negative for ischemia.  Back to exercising.  Doing well.  No cardiac symptoms with increased activity or exertion.      HEALTH MAINTENANCE.  Physical today.  Mammogram - 04/04/13 - Birads II.   Colonoscopy 08/20/12.

## 2013-05-25 NOTE — Assessment & Plan Note (Addendum)
Low cholesterol diet.  Will follow.  On simvastatin.  Check lipid panel and liver function.   

## 2013-05-25 NOTE — Assessment & Plan Note (Signed)
Doing better.  Seeing Dr Maryruth Bun.  Only on Zoloft now.  Follow.

## 2013-05-29 ENCOUNTER — Telehealth: Payer: Self-pay | Admitting: Internal Medicine

## 2013-05-29 NOTE — Telephone Encounter (Signed)
Envelope for Dr. Lorin Picket in her box.

## 2013-06-24 ENCOUNTER — Telehealth: Payer: Self-pay | Admitting: *Deleted

## 2013-06-24 DIAGNOSIS — M810 Age-related osteoporosis without current pathological fracture: Secondary | ICD-10-CM

## 2013-06-24 NOTE — Telephone Encounter (Signed)
Pt left voicemail stating the [facility] needs her to have a Vitamin D lab drawn & sent to [facility] prior to her infusion appt on 7/25. Please let me know when order is placed so that I can schedule her lab appt.

## 2013-06-25 NOTE — Telephone Encounter (Signed)
Lab appt scheduled & pt notified 

## 2013-06-25 NOTE — Telephone Encounter (Signed)
I placed order for vit D lab.  Please schedule her for a lab appt.  Thanks.

## 2013-06-27 ENCOUNTER — Other Ambulatory Visit (INDEPENDENT_AMBULATORY_CARE_PROVIDER_SITE_OTHER): Payer: BC Managed Care – PPO

## 2013-06-27 ENCOUNTER — Telehealth: Payer: Self-pay | Admitting: *Deleted

## 2013-06-27 DIAGNOSIS — M810 Age-related osteoporosis without current pathological fracture: Secondary | ICD-10-CM

## 2013-06-27 NOTE — Telephone Encounter (Signed)
Pt came in for labs, said she need previous lab work and today lab work faxed to [facility] [phone]

## 2013-06-27 NOTE — Telephone Encounter (Signed)
noted 

## 2013-06-28 ENCOUNTER — Encounter: Payer: Self-pay | Admitting: Internal Medicine

## 2013-06-28 LAB — VITAMIN D 25 HYDROXY (VIT D DEFICIENCY, FRACTURES): Vit D, 25-Hydroxy: 54 ng/mL (ref 30–89)

## 2013-06-30 ENCOUNTER — Encounter: Payer: Self-pay | Admitting: *Deleted

## 2013-06-30 ENCOUNTER — Other Ambulatory Visit: Payer: Self-pay

## 2013-06-30 NOTE — Telephone Encounter (Signed)
faxed

## 2013-08-18 ENCOUNTER — Other Ambulatory Visit: Payer: Self-pay | Admitting: *Deleted

## 2013-08-18 MED ORDER — SIMVASTATIN 20 MG PO TABS: 20.0000 mg | ORAL_TABLET | Freq: Every day | ORAL | Status: DC

## 2013-10-16 ENCOUNTER — Other Ambulatory Visit: Payer: Self-pay

## 2013-11-12 ENCOUNTER — Other Ambulatory Visit: Payer: Self-pay | Admitting: *Deleted

## 2013-11-12 MED ORDER — SIMVASTATIN 20 MG PO TABS: 20.0000 mg | ORAL_TABLET | Freq: Every day | ORAL | Status: DC

## 2013-11-25 ENCOUNTER — Ambulatory Visit (INDEPENDENT_AMBULATORY_CARE_PROVIDER_SITE_OTHER): Payer: 59 | Admitting: Internal Medicine

## 2013-11-25 ENCOUNTER — Encounter: Payer: Self-pay | Admitting: Internal Medicine

## 2013-11-25 ENCOUNTER — Other Ambulatory Visit: Payer: Self-pay | Admitting: Internal Medicine

## 2013-11-25 VITALS — BP 130/80 | HR 71 | Temp 98.3°F | Ht 63.5 in | Wt 140.2 lb

## 2013-11-25 DIAGNOSIS — I498 Other specified cardiac arrhythmias: Secondary | ICD-10-CM

## 2013-11-25 DIAGNOSIS — E7800 Pure hypercholesterolemia, unspecified: Secondary | ICD-10-CM

## 2013-11-25 DIAGNOSIS — F3289 Other specified depressive episodes: Secondary | ICD-10-CM

## 2013-11-25 DIAGNOSIS — R000 Tachycardia, unspecified: Secondary | ICD-10-CM

## 2013-11-25 DIAGNOSIS — F329 Major depressive disorder, single episode, unspecified: Secondary | ICD-10-CM

## 2013-11-25 DIAGNOSIS — Z23 Encounter for immunization: Secondary | ICD-10-CM

## 2013-11-25 LAB — LIPID PANEL: Total CHOL/HDL Ratio: 4

## 2013-11-25 LAB — COMPREHENSIVE METABOLIC PANEL
ALT: 23 U/L (ref 0–35)
AST: 26 U/L (ref 0–37)
Albumin: 4.3 g/dL (ref 3.5–5.2)
BUN: 14 mg/dL (ref 6–23)
CO2: 32 mEq/L (ref 19–32)
Calcium: 9.9 mg/dL (ref 8.4–10.5)
Chloride: 102 mEq/L (ref 96–112)
GFR: 84.96 mL/min (ref >60.00)
Potassium: 4.4 mEq/L (ref 3.5–5.1)

## 2013-11-25 MED ORDER — SIMVASTATIN 20 MG PO TABS: 20.0000 mg | ORAL_TABLET | Freq: Every day | ORAL | Status: DC

## 2013-11-25 NOTE — Assessment & Plan Note (Signed)
Low cholesterol diet.  Will follow.  On simvastatin.  Check lipid panel and liver function.   

## 2013-11-25 NOTE — Addendum Note (Signed)
Addended by: Warden Fillers on: 11/25/2013 08:33 AM   Modules accepted: Orders

## 2013-11-25 NOTE — Assessment & Plan Note (Signed)
Doing better.  Seeing Dr Maryruth Bun.  Only on Zoloft now.  Follow.

## 2013-11-25 NOTE — Progress Notes (Signed)
Orders placed for f/u labs.  

## 2013-11-25 NOTE — Addendum Note (Signed)
Addended by: Montine Circle D on: 11/25/2013 08:36 AM   Modules accepted: Orders

## 2013-11-25 NOTE — Progress Notes (Signed)
  Subjective:    Patient ID: Angelica Robertson, female    DOB: 1945-12-31, 67 y.o.   MRN: [id number]  HPI 67 year old female with past history of depression and hypercholesterolemia who comes in today for a scheduled follow up.   Was previously having problems with depression.  Was hospitalized for a brief period.  Seeing Dr Maryruth Bun now.  On zoloft.  Doing well.  Feels better.  Sleeping better.  Feels good.  Back to exercising.  No chest pain or tightness with increased activity or exertion.  No sob.  No acid reflux.  No bowel change.  No abdominal pain or cramping.  Restless legs have improved with stretching and magnesium.        Past Medical History  Diagnosis Date  . Depression 2000  . Hypercholesterolemia Osteoporosis  . Heart murmur     Current Outpatient Prescriptions on File Prior to Visit  Medication Sig Dispense Refill  . calcium-vitamin D (OSCAL WITH D) 500-200 MG-UNIT per tablet Take 1 tablet by mouth daily.  30 tablet  0  . sertraline (ZOLOFT) 50 MG tablet Take 1 tablet (50 mg total) by mouth daily.  30 tablet  0  . simvastatin (ZOCOR) 20 MG tablet Take 1 tablet (20 mg total) by mouth daily.  90 tablet  0   No current facility-administered medications on file prior to visit.    Review of Systems Patient denies any headache, lightheadedness or dizziness. No sinus or allergy symptoms.  No chest pain, tightness or palpitations.  No increased shortness of breath, cough or congestion.  No acid reflux.  On no medication.  No nausea or vomiting.  No abdominal pain or cramping.  No bowel change, such as diarrhea, constipation, BRBPR or melana.  No urine change.  Restless legs improved with magnesium and stretches.         Objective:   Physical Exam  Filed Vitals:   11/25/13 0803  BP: 130/80  Pulse: 71  Temp: 98.3 F (36.8 C)   Blood pressure recheck:  118/78, pulse 61  67 year old female in no acute distress.   HEENT:  Nares- clear.  Oropharynx - without lesions. NECK:  Supple.   Nontender.  No audible bruit.  HEART:  Appears to be regular. LUNGS:  No crackles or wheezing audible.  Respirations even and unlabored.  RADIAL PULSE:  Equal bilaterally.    BREASTS:  No nipple discharge or nipple retraction present.  Could not appreciate any distinct nodules or axillary adenopathy.  ABDOMEN:  Soft, nontender.  Bowel sounds present and normal.  No audible abdominal bruit.  GU:  Normal external genitalia.  Vaginal vault without lesions.  S/p hysterectomy. Could not appreciate any adnexal masses or tenderness.   RECTAL:  Heme negative.   EXTREMITIES:  No increased edema present.  DP pulses palpable and equal bilaterally.             Assessment & Plan:  NECK PAIN.  Not an issue for her now.  Follow.    CARDIOVASCULAR.  Recent stress test negative for ischemia.  Back to exercising.  Doing well.  No cardiac symptoms with increased activity or exertion.      HEALTH MAINTENANCE.  Physical 05/23/13.   Mammogram - 04/04/13 - Birads II.   Colonoscopy 08/20/12.  Information givne to schedule mammogram.  Prevnar given today.

## 2013-11-25 NOTE — Assessment & Plan Note (Signed)
Resolved

## 2013-11-26 ENCOUNTER — Encounter: Payer: Self-pay | Admitting: Internal Medicine

## 2014-04-06 ENCOUNTER — Ambulatory Visit: Payer: Self-pay | Admitting: Internal Medicine

## 2014-04-06 LAB — HM MAMMOGRAPHY: HM Mammogram: NEGATIVE

## 2014-04-07 ENCOUNTER — Encounter: Payer: Self-pay | Admitting: Internal Medicine

## 2014-05-25 ENCOUNTER — Encounter: Payer: Self-pay | Admitting: Internal Medicine

## 2014-06-02 ENCOUNTER — Encounter: Payer: Self-pay | Admitting: Internal Medicine

## 2014-06-11 ENCOUNTER — Ambulatory Visit (INDEPENDENT_AMBULATORY_CARE_PROVIDER_SITE_OTHER): Payer: 59 | Admitting: Internal Medicine

## 2014-06-11 ENCOUNTER — Ambulatory Visit (INDEPENDENT_AMBULATORY_CARE_PROVIDER_SITE_OTHER)
Admission: RE | Admit: 2014-06-11 | Discharge: 2014-06-11 | Disposition: A | Discharge status: Home / Self Care | Payer: Self-pay | Source: Ambulatory Visit | Attending: Internal Medicine | Admitting: Internal Medicine

## 2014-06-11 ENCOUNTER — Encounter: Payer: Self-pay | Admitting: Internal Medicine

## 2014-06-11 VITALS — BP 140/80 | HR 78 | Temp 98.4°F | Ht 64.0 in | Wt 137.5 lb

## 2014-06-11 DIAGNOSIS — E7800 Pure hypercholesterolemia, unspecified: Secondary | ICD-10-CM

## 2014-06-11 DIAGNOSIS — IMO0001 Reserved for inherently not codable concepts without codable children: Secondary | ICD-10-CM

## 2014-06-11 DIAGNOSIS — F3289 Other specified depressive episodes: Secondary | ICD-10-CM

## 2014-06-11 DIAGNOSIS — F329 Major depressive disorder, single episode, unspecified: Secondary | ICD-10-CM

## 2014-06-11 DIAGNOSIS — M542 Cervicalgia: Secondary | ICD-10-CM

## 2014-06-11 DIAGNOSIS — R030 Elevated blood-pressure reading, without diagnosis of hypertension: Secondary | ICD-10-CM

## 2014-06-11 DIAGNOSIS — F32A Depression, unspecified: Secondary | ICD-10-CM

## 2014-06-11 LAB — COMPREHENSIVE METABOLIC PANEL
ALT: 26 U/L (ref 0–35)
AST: 35 U/L (ref 0–37)
Albumin: 4.6 g/dL (ref 3.5–5.2)
Alkaline Phosphatase: 58 U/L (ref 39–117)
BILIRUBIN TOTAL: 0.8 mg/dL (ref 0.2–1.2)
BUN: 10 mg/dL (ref 6–23)
CALCIUM: 9.8 mg/dL (ref 8.4–10.5)
CHLORIDE: 102 meq/L (ref 96–112)
CO2: 26 mEq/L (ref 19–32)
Creatinine, Ser: 0.6 mg/dL (ref 0.4–1.2)
GFR: 102.41 mL/min (ref >60.00)
GLUCOSE: 124 mg/dL — AB (ref 70–99)
Potassium: 4 mEq/L (ref 3.5–5.1)
SODIUM: 136 meq/L (ref 135–145)
TOTAL PROTEIN: 7.6 g/dL (ref 6.0–8.3)

## 2014-06-11 LAB — LIPID PANEL
CHOL/HDL RATIO: 3
Cholesterol: 204 mg/dL — ABNORMAL HIGH (ref 0–200)
HDL: 70.3 mg/dL (ref >39.00)
LDL Cholesterol: 114 mg/dL — ABNORMAL HIGH (ref 0–99)
NONHDL: 133.7
Triglycerides: 101 mg/dL (ref 0.0–149.0)
VLDL: 20.2 mg/dL (ref 0.0–40.0)

## 2014-06-11 LAB — CBC WITH DIFFERENTIAL/PLATELET
BASOS ABS: 0 10*3/uL (ref 0.0–0.1)
Basophils Relative: 0.4 % (ref 0.0–3.0)
EOS PCT: 0.9 % (ref 0.0–5.0)
Eosinophils Absolute: 0.1 10*3/uL (ref 0.0–0.7)
HEMATOCRIT: 40.2 % (ref 36.0–46.0)
HEMOGLOBIN: 13.7 g/dL (ref 12.0–15.0)
LYMPHS ABS: 1.3 10*3/uL (ref 0.7–4.0)
Lymphocytes Relative: 14.3 % (ref 12.0–46.0)
MCHC: 34.1 g/dL (ref 30.0–36.0)
MCV: 87.3 fl (ref 78.0–100.0)
Monocytes Absolute: 0.5 10*3/uL (ref 0.1–1.0)
Monocytes Relative: 5.8 % (ref 3.0–12.0)
NEUTROS ABS: 7.4 10*3/uL (ref 1.4–7.7)
Neutrophils Relative %: 78.6 % — ABNORMAL HIGH (ref 43.0–77.0)
PLATELETS: 330 10*3/uL (ref 150.0–400.0)
RBC: 4.61 Mil/uL (ref 3.87–5.11)
RDW: 13.7 % (ref 11.5–15.5)
WBC: 9.4 10*3/uL (ref 4.0–10.5)

## 2014-06-11 LAB — TSH: TSH: 1.08 u[IU]/mL (ref 0.35–4.50)

## 2014-06-11 MED ORDER — CYCLOBENZAPRINE HCL 5 MG PO TABS: 5.0000 mg | ORAL_TABLET | Freq: Every evening | ORAL | Status: DC | PRN

## 2014-06-11 NOTE — Progress Notes (Signed)
Pre visit review using our clinic review tool, if applicable. No additional management support is needed unless otherwise documented below in the visit note. 

## 2014-06-11 NOTE — Progress Notes (Signed)
Subjective:    Patient ID: Angelica Robertson, female    DOB: 1946/05/12, 68 y.o.   MRN: [id number]  HPI 68 year old female with past history of depression and hypercholesterolemia who comes in today to follow up on these issues as well as for a complete physical exam.   Was previously having problems with depression.  Was hospitalized for a brief period.  Seeing Dr Maryruth BunKapur now.  On zoloft.  Doing well.  Feels better.  Sleeping better.  Feels good.  Back to exercising.  No chest pain or tightness with increased activity or exertion.  No sob.  No acid reflux.  No bowel change.  No abdominal pain or cramping.  Restless legs have improved with stretching and magnesium.  She is having increased neck and shoulder pain.  This has flared intermittently.  Taking advil.  Helps some.  Worse during the day.  No pain in her arms.  No numbness or tingling.  Some headache associated with the neck discomfort.         Past Medical History  Diagnosis Date  . Depression 2000  . Hypercholesterolemia Osteoporosis  . Heart murmur     Current Outpatient Prescriptions on File Prior to Visit  Medication Sig Dispense Refill  . sertraline (ZOLOFT) 50 MG tablet Take 1 tablet (50 mg total) by mouth daily.  30 tablet  0  . simvastatin (ZOCOR) 20 MG tablet Take 1 tablet (20 mg total) by mouth daily.  90 tablet  3   No current facility-administered medications on file prior to visit.    Review of Systems Patient denies any lightheadedness or dizziness.  Headache associated with the neck discomfort as outlined.  Increased neck pain.  No sinus or allergy symptoms.  No chest pain, tightness or palpitations.  No increased shortness of breath, cough or congestion.  No acid reflux.  On no medication.  No nausea or vomiting.  No abdominal pain or cramping.  No bowel change, such as diarrhea, constipation, BRBPR or melana.  No urine change.  Restless legs improved with magnesium and stretches.  Doing well with her depression.  Still  seeing Dr Maryruth BunKapur.        Objective:   Physical Exam  Filed Vitals:   06/11/14 0838  BP: 140/80  Pulse: 78  Temp: 98.4 F (36.9 C)   Blood pressure recheck:  33138/7678  68 year old female in no acute distress.   HEENT:  Nares- clear.  Oropharynx - without lesions. NECK:  Supple.  Nontender.  No audible bruit.  Some increased discomfort and pulling sensation when looking to the right and left.   HEART:  Appears to be regular. LUNGS:  No crackles or wheezing audible.  Respirations even and unlabored.  RADIAL PULSE:  Equal bilaterally.    BREASTS:  No nipple discharge or nipple retraction present.  Could not appreciate any distinct nodules or axillary adenopathy.  ABDOMEN:  Soft, nontender.  Bowel sounds present and normal.  No audible abdominal bruit.  GU:  Not performed.     EXTREMITIES:  No increased edema present.  DP pulses palpable and equal bilaterally.      MSK:  No pain with abduction or full extension of her arms.        Assessment & Plan:  CARDIOVASCULAR.  Recent stress test negative for ischemia.  Back to exercising.  Doing well.  No cardiac symptoms with increased activity or exertion.      HEALTH MAINTENANCE.  Physical last visit.  Mammogram - 04/06/14 - Birads I.   Colonoscopy 08/20/12.

## 2014-06-14 ENCOUNTER — Encounter: Payer: Self-pay | Admitting: Internal Medicine

## 2014-06-14 DIAGNOSIS — M542 Cervicalgia: Secondary | ICD-10-CM | POA: Insufficient documentation

## 2014-06-14 DIAGNOSIS — R030 Elevated blood-pressure reading, without diagnosis of hypertension: Secondary | ICD-10-CM | POA: Insufficient documentation

## 2014-06-14 NOTE — Assessment & Plan Note (Signed)
Blood pressure slightly increased compared to recent checks.  Has the increased pain in her neck.  Treat.  Follow blood pressures.  Get her back in soon to reassess.

## 2014-06-14 NOTE — Assessment & Plan Note (Signed)
Low cholesterol diet.  Will follow.  On simvastatin.  Check lipid panel and liver function.

## 2014-06-14 NOTE — Assessment & Plan Note (Signed)
Doing better.  Seeing Dr Maryruth BunKapur.  On Zoloft.  Follow.

## 2014-06-14 NOTE — Assessment & Plan Note (Signed)
Neck pain and exam as outlined.  Will give her flexeril 5mg  q hs prn.  Instructed on possible side effects and possible risk of medication.  Instructed not to drive or operate machinery while taking.  Check C-spine xray.

## 2014-06-15 NOTE — Telephone Encounter (Signed)
Unread mychart message mailed to patient 

## 2014-06-15 NOTE — Progress Notes (Signed)
Appointment scheduled, pt notified

## 2014-06-16 ENCOUNTER — Telehealth: Payer: Self-pay | Admitting: *Deleted

## 2014-06-16 NOTE — Telephone Encounter (Signed)
Will discuss with pt at her next appt

## 2014-06-16 NOTE — Telephone Encounter (Signed)
[facility] called to report that patient is due for her Reclast. Will need Vitamin D, Calcium, & bone density (last don in 2013).

## 2014-06-17 NOTE — Telephone Encounter (Signed)
Noted on appt schedule

## 2014-06-18 ENCOUNTER — Encounter: Payer: Self-pay | Admitting: Internal Medicine

## 2014-06-19 ENCOUNTER — Telehealth: Payer: Self-pay | Admitting: Internal Medicine

## 2014-06-19 DIAGNOSIS — M542 Cervicalgia: Secondary | ICD-10-CM

## 2014-06-19 NOTE — Telephone Encounter (Signed)
Order placed for physical therapy.  

## 2014-06-24 ENCOUNTER — Encounter: Payer: Self-pay | Admitting: Internal Medicine

## 2014-07-06 ENCOUNTER — Telehealth: Payer: Self-pay | Admitting: *Deleted

## 2014-07-06 DIAGNOSIS — R739 Hyperglycemia, unspecified: Secondary | ICD-10-CM

## 2014-07-06 NOTE — Telephone Encounter (Signed)
What labs and dx?  

## 2014-07-06 NOTE — Telephone Encounter (Signed)
Lab orders placed.  

## 2014-07-07 ENCOUNTER — Encounter: Payer: Self-pay | Admitting: Internal Medicine

## 2014-07-07 ENCOUNTER — Other Ambulatory Visit (INDEPENDENT_AMBULATORY_CARE_PROVIDER_SITE_OTHER): Payer: 59

## 2014-07-07 DIAGNOSIS — R7309 Other abnormal glucose: Secondary | ICD-10-CM

## 2014-07-07 DIAGNOSIS — R739 Hyperglycemia, unspecified: Secondary | ICD-10-CM

## 2014-07-07 LAB — HEMOGLOBIN A1C: HEMOGLOBIN A1C: 6 % (ref 4.6–6.5)

## 2014-07-08 LAB — GLUCOSE, FASTING: Glucose, Fasting: 97 mg/dL (ref 70–99)

## 2014-08-13 ENCOUNTER — Encounter: Payer: Self-pay | Admitting: Internal Medicine

## 2014-08-13 ENCOUNTER — Ambulatory Visit (INDEPENDENT_AMBULATORY_CARE_PROVIDER_SITE_OTHER): Payer: 59 | Admitting: Internal Medicine

## 2014-08-13 VITALS — BP 120/68 | HR 72 | Temp 98.4°F | Resp 14 | Ht 64.0 in | Wt 138.2 lb

## 2014-08-13 DIAGNOSIS — F329 Major depressive disorder, single episode, unspecified: Secondary | ICD-10-CM

## 2014-08-13 DIAGNOSIS — IMO0001 Reserved for inherently not codable concepts without codable children: Secondary | ICD-10-CM

## 2014-08-13 DIAGNOSIS — M810 Age-related osteoporosis without current pathological fracture: Secondary | ICD-10-CM

## 2014-08-13 DIAGNOSIS — Z23 Encounter for immunization: Secondary | ICD-10-CM

## 2014-08-13 DIAGNOSIS — F32A Depression, unspecified: Secondary | ICD-10-CM

## 2014-08-13 DIAGNOSIS — E7800 Pure hypercholesterolemia, unspecified: Secondary | ICD-10-CM

## 2014-08-13 DIAGNOSIS — M542 Cervicalgia: Secondary | ICD-10-CM

## 2014-08-13 DIAGNOSIS — R030 Elevated blood-pressure reading, without diagnosis of hypertension: Secondary | ICD-10-CM

## 2014-08-13 DIAGNOSIS — F3289 Other specified depressive episodes: Secondary | ICD-10-CM

## 2014-08-13 NOTE — Progress Notes (Signed)
Pre visit review using our clinic review tool, if applicable. No additional management support is needed unless otherwise documented below in the visit note. 

## 2014-08-17 ENCOUNTER — Encounter: Payer: Self-pay | Admitting: Internal Medicine

## 2014-08-17 DIAGNOSIS — M8580 Other specified disorders of bone density and structure, unspecified site: Secondary | ICD-10-CM | POA: Insufficient documentation

## 2014-08-17 NOTE — Assessment & Plan Note (Signed)
Doing better.  Seeing Dr Kapur.  On Zoloft.  Follow.   

## 2014-08-17 NOTE — Assessment & Plan Note (Signed)
Low cholesterol diet.  Will follow.  On simvastatin.  Follow lipid panel and liver function.

## 2014-08-17 NOTE — Assessment & Plan Note (Signed)
Neck pain is better.  Doing exercises at home.  Follow.

## 2014-08-17 NOTE — Assessment & Plan Note (Signed)
Will schedule bone density.  Was receiving reclast.

## 2014-08-17 NOTE — Assessment & Plan Note (Signed)
Blood pressure doing well.  See her attached list.  Follow.  On no medication.

## 2014-08-17 NOTE — Progress Notes (Signed)
Subjective:    Patient ID: AME HEAGLE, female    DOB: 1946-07-02, 68 y.o.   MRN: [id number]  HPI 68 year old female with past history of depression and hypercholesterolemia who comes in today for a scheduled follow up.   Was previously having problems with depression.  Was hospitalized for a brief period.  Seeing Dr Maryruth Bun now.  On zoloft.  Doing well.  Feels better.  Sleeping better.  Feels good.  Back to exercising.  No chest pain or tightness with increased activity or exertion.  No sob.  No acid reflux.  No bowel change.  No abdominal pain or cramping.   Neck is better.  Went to physical therapy x 4 visits.  Doing exercise at home now.  Neck is better.  Going to [employer].  Blood pressure is doing well.  Blood pressures averaging 110-130/60-70.         Past Medical History  Diagnosis Date  . Depression 2000  . Hypercholesterolemia Osteoporosis  . Heart murmur     Current Outpatient Prescriptions on File Prior to Visit  Medication Sig Dispense Refill  . calcium carbonate (OS-CAL) 600 MG TABS tablet Take 600 mg by mouth 2 (two) times daily with a meal.      . Calcium-Magnesium-Zinc 333-133-5 MG TABS Take by mouth.      . Cholecalciferol (VITAMIN D PO) Take 250 mg by mouth 2 (two) times daily.      . sertraline (ZOLOFT) 50 MG tablet Take 1 tablet (50 mg total) by mouth daily.  30 tablet  0  . simvastatin (ZOCOR) 20 MG tablet Take 1 tablet (20 mg total) by mouth daily.  90 tablet  3  . cyclobenzaprine (FLEXERIL) 5 MG tablet Take 1 tablet (5 mg total) by mouth at bedtime as needed for muscle spasms.  10 tablet  0   No current facility-administered medications on file prior to visit.    Review of Systems Patient denies any lightheadedness or dizziness.  No headache.  Neck pain is better.  No sinus or allergy symptoms.  No chest pain, tightness or palpitations.  No increased shortness of breath, cough or congestion.  No acid reflux.  On no medication.  No nausea or vomiting.  No  abdominal pain or cramping.  No bowel change, such as diarrhea, constipation, BRBPR or melana.  No urine change.   Doing well with her depression.  Still seeing Dr Maryruth Bun.   Blood pressure a outlined.       Objective:   Physical Exam  Filed Vitals:   08/13/14 1129  BP: 120/68  Pulse: 72  Temp: 98.4 F (36.9 C)  Resp: 14   Blood pressure recheck:  57/27  68 year old female in no acute distress.   HEENT:  Nares- clear.  Oropharynx - without lesions. NECK:  Supple.  Nontender.  No audible bruit.  Some increased discomfort and pulling sensation when looking to the right and left.   HEART:  Appears to be regular. LUNGS:  No crackles or wheezing audible.  Respirations even and unlabored.  RADIAL PULSE:  Equal bilaterally.  ABDOMEN:  Soft, nontender.  Bowel sounds present and normal.  No audible abdominal bruit.    EXTREMITIES:  No increased edema present.  DP pulses palpable and equal bilaterally.       Assessment & Plan:  CARDIOVASCULAR.  Recent stress test negative for ischemia.  Back to exercising.  Doing well.  No cardiac symptoms with increased activity or exertion.  HEALTH MAINTENANCE.  Physical 11/25/13.   Mammogram - 04/06/14 - Birads I.   Colonoscopy 08/20/12.

## 2014-08-19 ENCOUNTER — Encounter: Payer: Self-pay | Admitting: Internal Medicine

## 2014-08-28 ENCOUNTER — Encounter: Payer: Self-pay | Admitting: Internal Medicine

## 2014-11-09 ENCOUNTER — Other Ambulatory Visit: Payer: Self-pay | Admitting: *Deleted

## 2014-11-09 MED ORDER — SIMVASTATIN 20 MG PO TABS: 20.0000 mg | ORAL_TABLET | Freq: Every day | ORAL | Status: DC

## 2014-11-11 LAB — HM DEXA SCAN

## 2014-11-12 ENCOUNTER — Encounter: Payer: Self-pay | Admitting: *Deleted

## 2014-11-30 ENCOUNTER — Encounter: Payer: Self-pay | Admitting: *Deleted

## 2015-01-04 ENCOUNTER — Encounter: Payer: Self-pay | Admitting: Internal Medicine

## 2015-02-22 ENCOUNTER — Encounter: Payer: Self-pay | Admitting: Internal Medicine

## 2015-02-22 ENCOUNTER — Ambulatory Visit (INDEPENDENT_AMBULATORY_CARE_PROVIDER_SITE_OTHER): Payer: Medicare PPO | Admitting: Internal Medicine

## 2015-02-22 VITALS — BP 142/80 | HR 80 | Temp 98.0°F | Ht 63.5 in | Wt 139.0 lb

## 2015-02-22 DIAGNOSIS — R030 Elevated blood-pressure reading, without diagnosis of hypertension: Secondary | ICD-10-CM

## 2015-02-22 DIAGNOSIS — M542 Cervicalgia: Secondary | ICD-10-CM

## 2015-02-22 DIAGNOSIS — Z0000 Encounter for general adult medical examination without abnormal findings: Secondary | ICD-10-CM

## 2015-02-22 DIAGNOSIS — F32A Depression, unspecified: Secondary | ICD-10-CM

## 2015-02-22 DIAGNOSIS — F329 Major depressive disorder, single episode, unspecified: Secondary | ICD-10-CM

## 2015-02-22 DIAGNOSIS — E7800 Pure hypercholesterolemia, unspecified: Secondary | ICD-10-CM

## 2015-02-22 DIAGNOSIS — M810 Age-related osteoporosis without current pathological fracture: Secondary | ICD-10-CM

## 2015-02-22 DIAGNOSIS — IMO0001 Reserved for inherently not codable concepts without codable children: Secondary | ICD-10-CM

## 2015-02-22 MED ORDER — SIMVASTATIN 20 MG PO TABS: 20.0000 mg | ORAL_TABLET | Freq: Every day | ORAL | Status: DC

## 2015-02-22 MED ORDER — SERTRALINE HCL 50 MG PO TABS: 50.0000 mg | ORAL_TABLET | Freq: Every day | ORAL | Status: DC

## 2015-02-22 NOTE — Progress Notes (Signed)
Pre visit review using our clinic review tool, if applicable. No additional management support is needed unless otherwise documented below in the visit note. 

## 2015-02-26 ENCOUNTER — Telehealth: Payer: Self-pay | Admitting: *Deleted

## 2015-02-26 DIAGNOSIS — IMO0001 Reserved for inherently not codable concepts without codable children: Secondary | ICD-10-CM

## 2015-02-26 DIAGNOSIS — R030 Elevated blood-pressure reading, without diagnosis of hypertension: Secondary | ICD-10-CM

## 2015-02-26 DIAGNOSIS — R739 Hyperglycemia, unspecified: Secondary | ICD-10-CM

## 2015-02-26 DIAGNOSIS — M810 Age-related osteoporosis without current pathological fracture: Secondary | ICD-10-CM

## 2015-02-26 DIAGNOSIS — E7800 Pure hypercholesterolemia, unspecified: Secondary | ICD-10-CM

## 2015-02-26 NOTE — Telephone Encounter (Signed)
Orders placed for labs

## 2015-02-26 NOTE — Telephone Encounter (Signed)
Pt coming in Monday what labs and dx? 

## 2015-02-28 ENCOUNTER — Encounter: Payer: Self-pay | Admitting: Internal Medicine

## 2015-02-28 DIAGNOSIS — Z0000 Encounter for general adult medical examination without abnormal findings: Secondary | ICD-10-CM | POA: Insufficient documentation

## 2015-02-28 NOTE — Assessment & Plan Note (Signed)
Better.  Follow.  

## 2015-02-28 NOTE — Assessment & Plan Note (Signed)
Low cholesterol diet and exercise.  Follow lipid panel.   

## 2015-02-28 NOTE — Progress Notes (Signed)
Patient ID: Angelica Robertson, female   DOB: February 11, 1946, 69 y.o.   MRN: [id number]   Subjective:    Patient ID: Angelica Robertson, female    DOB: [birth year], 69 y.o.   MRN: [id number]  HPI  Patient here for her physical exam.  States she is doing well.  Feels good.  Exercising.  No cardiac symptoms with increased activity or exertion.  Breathing stable.  Some increased stress with her friends health issues.  She was just with her friend and she had to be emergently admitted after passing out.  Feels this is why blood pressure up initially - at this visit.  Overall feels good and feels she is doing well.     Past Medical History  Diagnosis Date  . Depression 2000  . Hypercholesterolemia Osteoporosis  . Heart murmur      Outpatient Encounter Prescriptions as of 02/22/2015  Medication Sig  . calcium carbonate (OS-CAL) 600 MG TABS tablet Take 600 mg by mouth 2 (two) times daily with a meal.  . Calcium-Magnesium-Zinc 333-133-5 MG TABS Take by mouth.  . Cholecalciferol (VITAMIN D PO) Take 250 mg by mouth 2 (two) times daily.  . cyclobenzaprine (FLEXERIL) 5 MG tablet Take 1 tablet (5 mg total) by mouth at bedtime as needed for muscle spasms.  Marland Kitchen sertraline (ZOLOFT) 50 MG tablet Take 1 tablet (50 mg total) by mouth daily.  . simvastatin (ZOCOR) 20 MG tablet Take 1 tablet (20 mg total) by mouth daily.  . [DISCONTINUED] sertraline (ZOLOFT) 50 MG tablet Take 1 tablet (50 mg total) by mouth daily.  . [DISCONTINUED] simvastatin (ZOCOR) 20 MG tablet Take 1 tablet (20 mg total) by mouth daily.    Review of Systems  Constitutional: Negative for appetite change and unexpected weight change.  HENT: Negative for congestion, sinus pressure and sore throat.   Eyes: Negative for pain and visual disturbance.  Respiratory: Negative for cough, chest tightness and shortness of breath.   Cardiovascular: Negative for chest pain, palpitations and leg swelling.  Gastrointestinal: Negative for nausea, vomiting, abdominal  pain and diarrhea.  Genitourinary: Negative for frequency and difficulty urinating.  Musculoskeletal: Negative for back pain, joint swelling and neck pain (neck pain is better.  ).  Skin: Negative for color change and rash.  Neurological: Negative for dizziness, light-headedness and headaches.  Hematological: Negative for adenopathy. Does not bruise/bleed easily.  Psychiatric/Behavioral: Negative for dysphoric mood and agitation.       Objective:     Blood pressure recheck:  134/82, pulse 76  Physical Exam  Constitutional: She is oriented to person, place, and time. She appears well-developed and well-nourished.  HENT:  Nose: Nose normal.  Mouth/Throat: Oropharynx is clear and moist.  Eyes: Right eye exhibits no discharge. Left eye exhibits no discharge. No scleral icterus.  Neck: Neck supple. No thyromegaly present.  Cardiovascular: Normal rate and regular rhythm.   Pulmonary/Chest: Breath sounds normal. No accessory muscle usage. No tachypnea. No respiratory distress. She has no decreased breath sounds. She has no wheezes. She has no rhonchi. Right breast exhibits no inverted nipple, no mass, no nipple discharge and no tenderness (no axillary adenopathy). Left breast exhibits no inverted nipple, no mass, no nipple discharge and no tenderness (no axilarry adenopathy).  Abdominal: Soft. Bowel sounds are normal. There is no tenderness.  Musculoskeletal: She exhibits no edema or tenderness.  Lymphadenopathy:    She has no cervical adenopathy.  Neurological: She is alert and oriented to person, place, and time.  Skin: Skin  is warm. No rash noted.  Psychiatric: She has a normal mood and affect. Her behavior is normal.    BP 142/80 mmHg  Pulse 80  Temp(Src) 98 F (36.7 C) (Oral)  Ht 5' 3.5" (1.613 m)  Wt 139 lb (63.05 kg)  BMI 24.23 kg/m2  SpO2 95%  LMP 11/27/1979 Wt Readings from Last 3 Encounters:  02/22/15 139 lb (63.05 kg)  08/13/14 138 lb 4 oz (62.71 kg)  06/11/14 137 lb 8  oz (62.37 kg)     Lab Results  Component Value Date   WBC 9.4 06/11/2014   HGB 13.7 06/11/2014   HCT 40.2 06/11/2014   PLT 330.0 06/11/2014   GLUCOSE 124* 06/11/2014   CHOL 204* 06/11/2014   TRIG 101.0 06/11/2014   HDL 70.30 06/11/2014   LDLCALC 114* 06/11/2014   ALT 26 06/11/2014   AST 35 06/11/2014   NA 136 06/11/2014   K 4.0 06/11/2014   CL 102 06/11/2014   CREATININE 0.6 06/11/2014   BUN 10 06/11/2014   CO2 26 06/11/2014   TSH 1.08 06/11/2014   HGBA1C 6.0 07/07/2014    Dg Cervical Spine 2 Or 3 Views  06/11/2014   CLINICAL DATA:  Neck pain  EXAM: CERVICAL SPINE - 2-3 VIEW  COMPARISON:  None.  FINDINGS: Frontal, lateral, and open-mouth odontoid images were obtained. There is no fracture or spondylolisthesis. Prevertebral soft tissues and predental space regions are normal. There is moderately severe disc space narrowing at C5-6 and C6-7. There is slight disc space narrowing at C4-5. There are prominent anterior osteophytes at C4, C5, C6, and C7. There is no erosive change. There is reversal of lordotic curvature.  IMPRESSION: Osteoarthritic changes several levels. No fracture or spondylolisthesis. Reversal of lordotic curvature is probably due to muscle spasm. If there is concern for ligamentous injury, lateral flexion-extension views could be helpful to further evaluate.   Electronically Signed   By: Bretta BangWilliam  Woodruff M.D.   On: 06/11/2014 10:41       Assessment & Plan:   Problem List Items Addressed This Visit    Depression    Doing better.  Has been seeing Dr Maryruth BunKapur.  Stable.  Plans to start getting medications refilled here since doing better.  Follow.       Relevant Medications   sertraline (ZOLOFT) tablet   Elevated blood pressure    Blood pressure on recheck improved.  Follow.  Have her spot check her blood pressure.  Follow metabolic panel.        Health care maintenance - Primary    Physical today.  Mammogram 04/06/14 - Birads I.  Colonoscopy 08/20/12.  She will  schedule her mammogram.        Hypercholesterolemia    Low cholesterol diet and exercise.  Follow lipid panel.       Relevant Medications   simvastatin (ZOCOR) tablet   Neck pain    Better.  Follow.        Osteoporosis    Noted improvement in her bone density.  Continue calcium and vitaminD and weight bearing exercise.          I spent 25 minutes with the patient and more than 50% of the time was spent in consultation regarding the above.     Dale DurhamSCOTT, Sheridan Hew, MD

## 2015-02-28 NOTE — Assessment & Plan Note (Signed)
Noted improvement in her bone density.  Continue calcium and vitaminD and weight bearing exercise.

## 2015-02-28 NOTE — Assessment & Plan Note (Signed)
Doing better.  Has been seeing Dr Maryruth BunKapur.  Stable.  Plans to start getting medications refilled here since doing better.  Follow.

## 2015-02-28 NOTE — Assessment & Plan Note (Signed)
Physical today.  Mammogram 04/06/14 - Birads I.  Colonoscopy 08/20/12.  She will schedule her mammogram.

## 2015-02-28 NOTE — Assessment & Plan Note (Signed)
Blood pressure on recheck improved.  Follow.  Have her spot check her blood pressure.  Follow metabolic panel.

## 2015-03-01 ENCOUNTER — Other Ambulatory Visit (INDEPENDENT_AMBULATORY_CARE_PROVIDER_SITE_OTHER): Payer: Medicare PPO

## 2015-03-01 DIAGNOSIS — E7800 Pure hypercholesterolemia, unspecified: Secondary | ICD-10-CM

## 2015-03-01 DIAGNOSIS — M810 Age-related osteoporosis without current pathological fracture: Secondary | ICD-10-CM

## 2015-03-01 DIAGNOSIS — IMO0001 Reserved for inherently not codable concepts without codable children: Secondary | ICD-10-CM

## 2015-03-01 DIAGNOSIS — R739 Hyperglycemia, unspecified: Secondary | ICD-10-CM

## 2015-03-01 DIAGNOSIS — R030 Elevated blood-pressure reading, without diagnosis of hypertension: Secondary | ICD-10-CM

## 2015-03-01 LAB — COMPREHENSIVE METABOLIC PANEL
ALBUMIN: 4.2 g/dL (ref 3.5–5.2)
ALT: 20 U/L (ref 0–35)
AST: 23 U/L (ref 0–37)
Alkaline Phosphatase: 52 U/L (ref 39–117)
BILIRUBIN TOTAL: 0.4 mg/dL (ref 0.2–1.2)
BUN: 11 mg/dL (ref 6–23)
CALCIUM: 9.7 mg/dL (ref 8.4–10.5)
CO2: 30 mEq/L (ref 19–32)
CREATININE: 0.74 mg/dL (ref 0.40–1.20)
Chloride: 104 mEq/L (ref 96–112)
GFR: 83.31 mL/min (ref >60.00)
Glucose, Bld: 102 mg/dL — ABNORMAL HIGH (ref 70–99)
Potassium: 4.3 mEq/L (ref 3.5–5.1)
Sodium: 139 mEq/L (ref 135–145)
TOTAL PROTEIN: 6.4 g/dL (ref 6.0–8.3)

## 2015-03-01 LAB — LIPID PANEL
Cholesterol: 188 mg/dL (ref 0–200)
HDL: 56.3 mg/dL (ref >39.00)
LDL Cholesterol: 109 mg/dL — ABNORMAL HIGH (ref 0–99)
NonHDL: 131.7
Total CHOL/HDL Ratio: 3
Triglycerides: 115 mg/dL (ref 0.0–149.0)
VLDL: 23 mg/dL (ref 0.0–40.0)

## 2015-03-01 LAB — VITAMIN D 25 HYDROXY (VIT D DEFICIENCY, FRACTURES): VITD: 35.16 ng/mL (ref 30.00–100.00)

## 2015-03-01 LAB — HEMOGLOBIN A1C: Hgb A1c MFr Bld: 6 % (ref 4.6–6.5)

## 2015-03-02 ENCOUNTER — Encounter: Payer: Self-pay | Admitting: Internal Medicine

## 2015-03-04 NOTE — Telephone Encounter (Signed)
Unread mychart message mailed to patient 

## 2015-03-26 ENCOUNTER — Other Ambulatory Visit: Payer: Self-pay | Admitting: Internal Medicine

## 2015-03-26 DIAGNOSIS — Z1231 Encounter for screening mammogram for malignant neoplasm of breast: Secondary | ICD-10-CM

## 2015-04-12 ENCOUNTER — Other Ambulatory Visit: Payer: Self-pay | Admitting: Internal Medicine

## 2015-04-12 ENCOUNTER — Ambulatory Visit
Admission: RE | Admit: 2015-04-12 | Discharge: 2015-04-12 | Disposition: A | Discharge status: Home / Self Care | Payer: Medicare PPO | Source: Ambulatory Visit | Attending: Internal Medicine | Admitting: Internal Medicine

## 2015-04-12 DIAGNOSIS — Z1231 Encounter for screening mammogram for malignant neoplasm of breast: Secondary | ICD-10-CM | POA: Insufficient documentation

## 2015-04-30 ENCOUNTER — Other Ambulatory Visit: Payer: Self-pay | Admitting: *Deleted

## 2015-04-30 MED ORDER — SERTRALINE HCL 50 MG PO TABS
50.0000 mg | ORAL_TABLET | Freq: Every day | ORAL | Status: DC
Start: 2015-04-30 — End: 2015-07-26

## 2015-05-28 ENCOUNTER — Encounter: Payer: Self-pay | Admitting: Nurse Practitioner

## 2015-05-28 ENCOUNTER — Ambulatory Visit (INDEPENDENT_AMBULATORY_CARE_PROVIDER_SITE_OTHER): Payer: Medicare PPO | Admitting: Nurse Practitioner

## 2015-05-28 VITALS — BP 138/82 | HR 85 | Temp 98.2°F | Resp 14 | Ht 63.5 in | Wt 141.6 lb

## 2015-05-28 DIAGNOSIS — M542 Cervicalgia: Secondary | ICD-10-CM | POA: Diagnosis not present

## 2015-05-28 NOTE — Progress Notes (Signed)
Subjective:    Patient ID: Angelica Robertson, female    DOB: [birth year], 69 y.o.   MRN: [id number]  HPI  Angelica Robertson is a 69 yo female with a CC of headache x 3 weeks and cervical spine problem hx.  1) PT helped last year in July  OA, reveral of lordotic curvature  Not taking flexeril- not helpful   Neck pain x 10 years  Chiropractor for 2 months awhile ago  Has been continuing her exercises from PT  Mowed the grass last Thursday, push mowed 1 section of grass Pulling sensation bilateral spinal muscles of the cervical spine, base of skull bilaterally, giving her a dull headache  Extra strength tylenol - 4-6 a day   Review of Systems  Constitutional: Negative for fever, chills, diaphoresis and fatigue.  Respiratory: Negative for chest tightness, shortness of breath and wheezing.   Cardiovascular: Negative for chest pain, palpitations and leg swelling.  Gastrointestinal: Negative for nausea, vomiting and diarrhea.  Musculoskeletal: Positive for myalgias and neck pain. Negative for back pain, arthralgias and neck stiffness.  Skin: Negative for rash.  Neurological: Negative for dizziness, weakness, numbness and headaches.  Psychiatric/Behavioral: The patient is not nervous/anxious.    Past Medical History  Diagnosis Date  . Depression 2000  . Hypercholesterolemia Osteoporosis  . Heart murmur     History   Social History  . Marital Status: Married    Spouse Name: N/A  . Number of Children: N/A  . Years of Education: N/A   Occupational History  . Not on file.   Social History Main Topics  . Smoking status: Never Smoker   . Smokeless tobacco: Never Used  . Alcohol Use: 0.0 oz/week    0 Standard drinks or equivalent per week     Comment: occasionally - socially  . Drug Use: No  . Sexual Activity: Not Currently   Other Topics Concern  . Not on file   Social History Narrative    Past Surgical History  Procedure Laterality Date  . Abdominal hysterectomy  1980     Family History  Problem Relation Age of Onset  . Heart disease Mother   . Diabetes Mother   . Heart disease Father   . Diabetes Father   . Colon cancer    . BRCA 1/2 Neg Hx   . Breast cancer Neg Hx   . Cancer Neg Hx   . Cowden syndrome Neg Hx   . DES usage Neg Hx   . Endometrial cancer Neg Hx   . Li-Fraumeni syndrome Neg Hx   . Ovarian cancer Neg Hx     No Known Allergies  Current Outpatient Prescriptions on File Prior to Visit  Medication Sig Dispense Refill  . calcium carbonate (OS-CAL) 600 MG TABS tablet Take 600 mg by mouth 2 (two) times daily with a meal.    . Calcium-Magnesium-Zinc 333-133-5 MG TABS Take by mouth.    . Cholecalciferol (VITAMIN D PO) Take 250 mg by mouth 2 (two) times daily.    . sertraline (ZOLOFT) 50 MG tablet Take 1 tablet (50 mg total) by mouth daily. 90 tablet 0  . simvastatin (ZOCOR) 20 MG tablet Take 1 tablet (20 mg total) by mouth daily. 90 tablet 1  . cyclobenzaprine (FLEXERIL) 5 MG tablet Take 1 tablet (5 mg total) by mouth at bedtime as needed for muscle spasms. (Patient not taking: Reported on 05/28/2015) 10 tablet 0   No current facility-administered medications on file prior to visit.  Objective:   Physical Exam  Constitutional: She is oriented to person, place, and time. She appears well-developed and well-nourished. No distress.  BP 138/82 mmHg  Pulse 85  Temp(Src) 98.2 F (36.8 C)  Resp 14  Ht 5' 3.5" (1.613 m)  Wt 141 lb 9.6 oz (64.229 kg)  BMI 24.69 kg/m2  SpO2 96%  LMP 11/27/1979   HENT:  Head: Normocephalic and atraumatic.  Right Ear: External ear normal.  Left Ear: External ear normal.  Cardiovascular: Normal rate, regular rhythm, normal heart sounds and intact distal pulses.  Exam reveals no gallop and no friction rub.   No murmur heard. Pulmonary/Chest: Effort normal and breath sounds normal. No respiratory distress. She has no wheezes. She has no rales. She exhibits no tenderness.  Musculoskeletal: Normal  range of motion. She exhibits tenderness. She exhibits no edema.  Neurological: She is alert and oriented to person, place, and time. No cranial nerve deficit. She exhibits normal muscle tone. Coordination normal.  Deltoid 5/5 Bilateral, Biceps 5/5 bilateral, Wrist extensors 5/5 bilateral, Triceps 5/5 bilateral, finger flexors and abductors 5/5 bilateral, grip 5/5 bilateraly no Hoffman's, intact heel/toe/sequential walking, sensation intact upper and lower extremities. Straight leg raise negative bilaterally. DTR's upper and lower 2+   Skin: Skin is warm and dry. No rash noted. She is not diaphoretic.  Psychiatric: She has a normal mood and affect. Her behavior is normal. Judgment and thought content normal.      Assessment & Plan:

## 2015-05-28 NOTE — Patient Instructions (Signed)
We will contact you about your referral to Physical Therapy.

## 2015-05-28 NOTE — Progress Notes (Signed)
Pre visit review using our clinic review tool, if applicable. No additional management support is needed unless otherwise documented below in the visit note. 

## 2015-05-30 DIAGNOSIS — M542 Cervicalgia: Secondary | ICD-10-CM | POA: Insufficient documentation

## 2015-05-30 NOTE — Assessment & Plan Note (Signed)
Pt responded well to PT last time and there is no msk/neuro deficit on exam today. Will place referral and would prefer same person as last year. Continue OTC advil/Aleve and encouraged to stop if gastritis.  FU prn worsening/failure to improve.

## 2015-06-07 ENCOUNTER — Encounter: Payer: Self-pay | Admitting: Nurse Practitioner

## 2015-07-26 ENCOUNTER — Other Ambulatory Visit: Payer: Self-pay | Admitting: *Deleted

## 2015-07-26 MED ORDER — SERTRALINE HCL 50 MG PO TABS: 50.0000 mg | ORAL_TABLET | Freq: Every day | ORAL | Status: DC

## 2015-07-26 MED ORDER — SIMVASTATIN 20 MG PO TABS: 20.0000 mg | ORAL_TABLET | Freq: Every day | ORAL | Status: DC

## 2015-07-28 IMAGING — MG MM SCREENING BREAST TOMO BILATERAL
8 of 12 series · 8 of 28 positions shown · non-contrast
Comparison: Previous exam(s).

CLINICAL DATA: Screening.

EXAM:
DIGITAL SCREENING BILATERAL MAMMOGRAM WITH 3D TOMO WITH CAD

[L CC synth-2D]
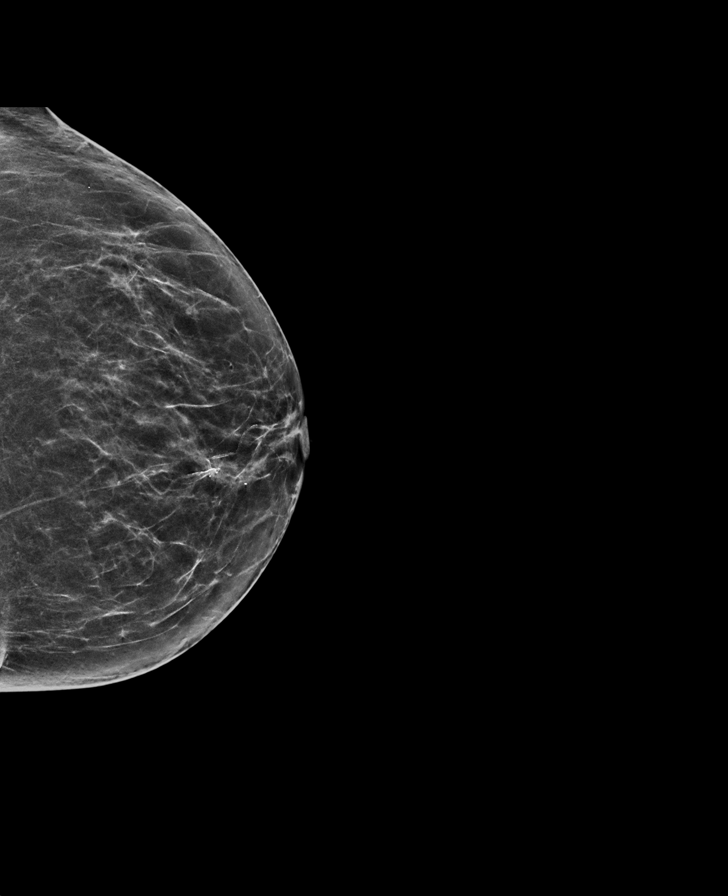

[L MLO]
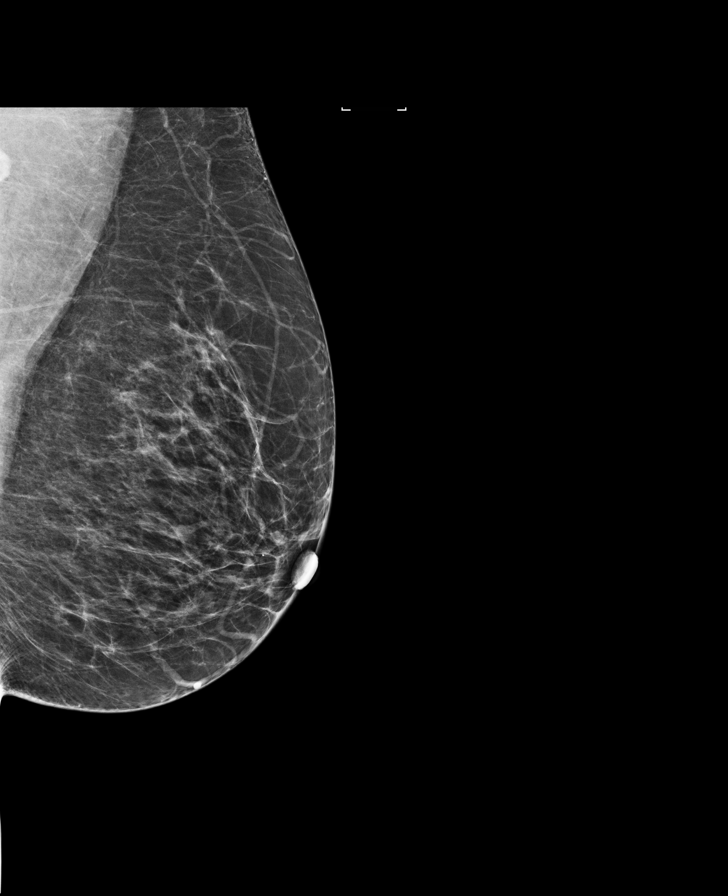

[L CC]
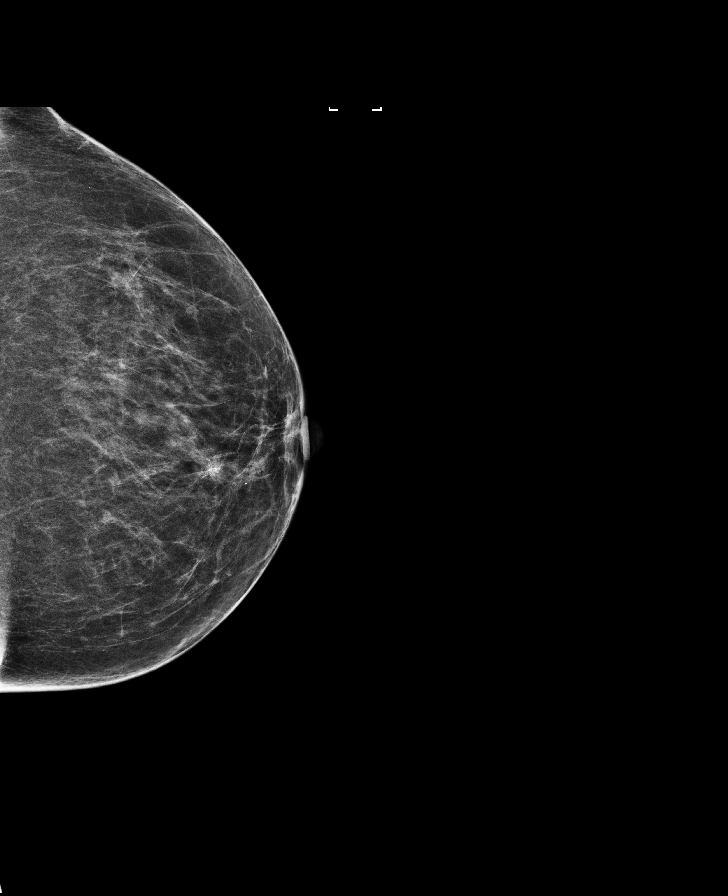

[R MLO]
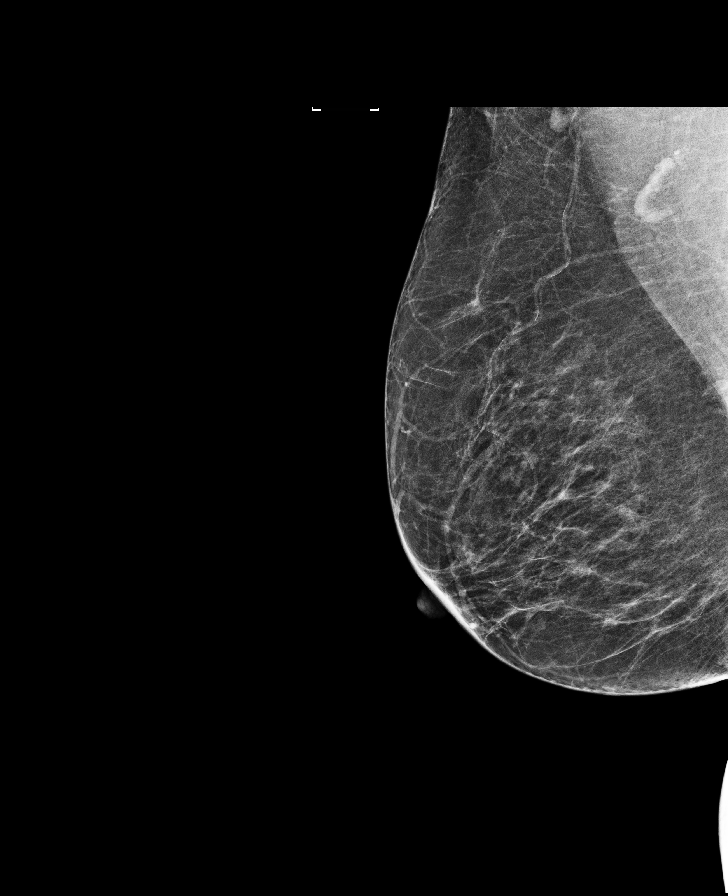

[R CC]
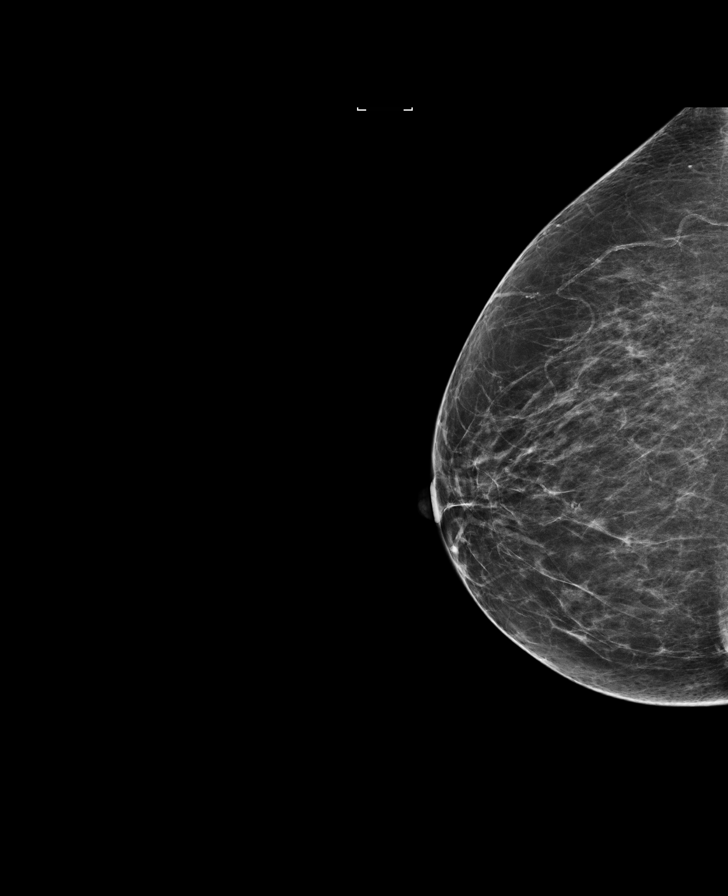

[L MLO synth-2D]
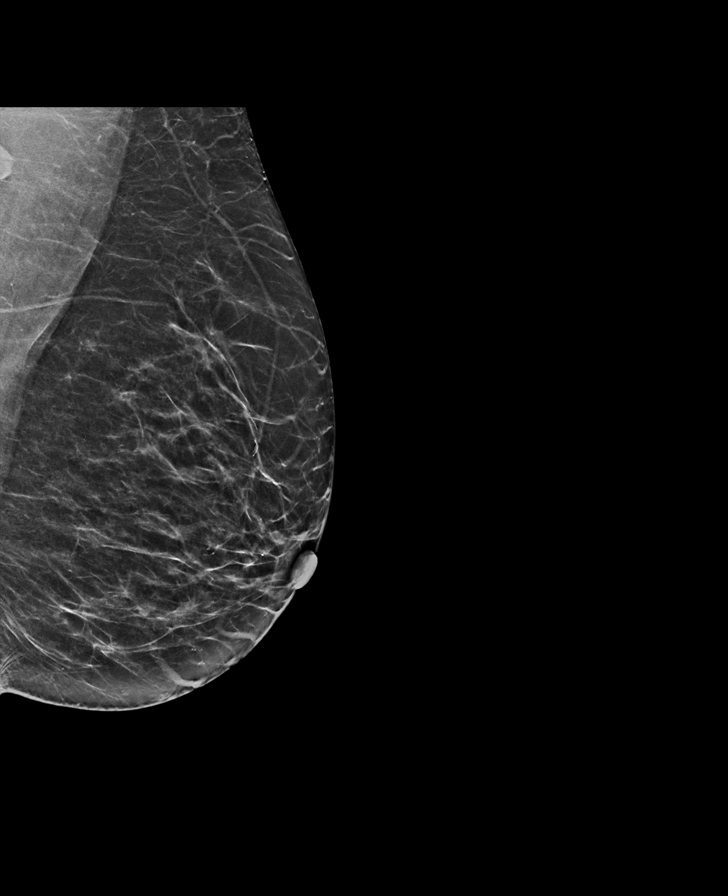

[R CC synth-2D]
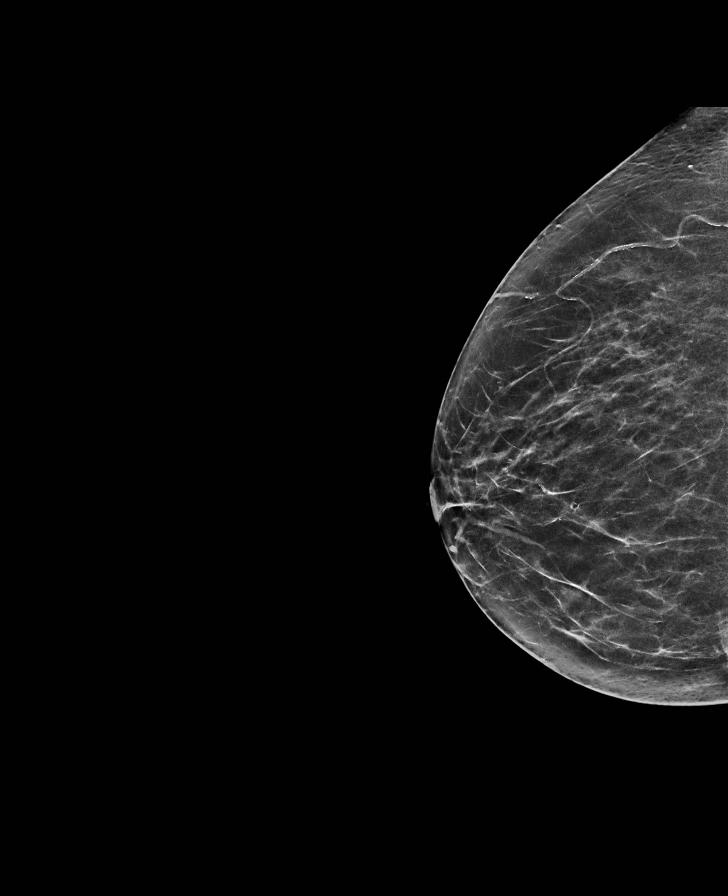

[R MLO synth-2D]
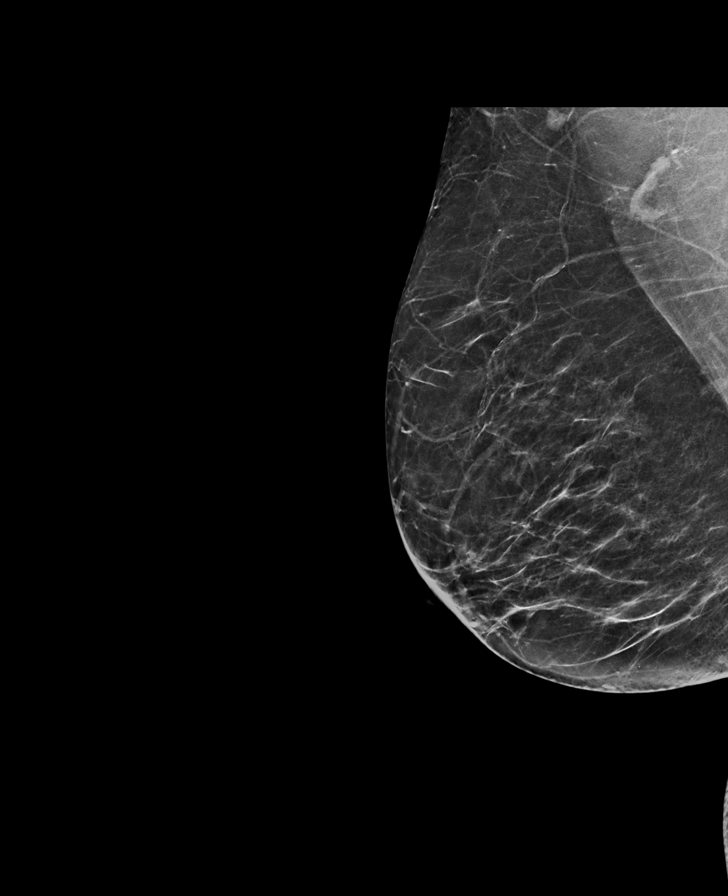

[8 of 28 positions shown; findings below may reference images not displayed]

ACR Breast Density Category b: There are scattered areas of
fibroglandular density.
FINDINGS: There are no findings suspicious for malignancy. Images were
processed with CAD.
IMPRESSION: No mammographic evidence of malignancy. A result letter of this
screening mammogram will be mailed directly to the patient.

RECOMMENDATION:
Screening mammogram in one year. (Code:55-L-23V)

BI-RADS CATEGORY  1: Negative.

## 2015-08-26 ENCOUNTER — Ambulatory Visit (INDEPENDENT_AMBULATORY_CARE_PROVIDER_SITE_OTHER): Payer: Medicare PPO | Admitting: Internal Medicine

## 2015-08-26 ENCOUNTER — Encounter: Payer: Self-pay | Admitting: Internal Medicine

## 2015-08-26 ENCOUNTER — Telehealth: Payer: Self-pay | Admitting: *Deleted

## 2015-08-26 VITALS — BP 118/80 | HR 79 | Temp 98.8°F | Ht 63.5 in | Wt 138.5 lb

## 2015-08-26 DIAGNOSIS — E7800 Pure hypercholesterolemia, unspecified: Secondary | ICD-10-CM

## 2015-08-26 DIAGNOSIS — M810 Age-related osteoporosis without current pathological fracture: Secondary | ICD-10-CM

## 2015-08-26 DIAGNOSIS — Z23 Encounter for immunization: Secondary | ICD-10-CM | POA: Diagnosis not present

## 2015-08-26 DIAGNOSIS — R739 Hyperglycemia, unspecified: Secondary | ICD-10-CM | POA: Diagnosis not present

## 2015-08-26 DIAGNOSIS — F32A Depression, unspecified: Secondary | ICD-10-CM

## 2015-08-26 DIAGNOSIS — E1165 Type 2 diabetes mellitus with hyperglycemia: Secondary | ICD-10-CM | POA: Insufficient documentation

## 2015-08-26 DIAGNOSIS — F329 Major depressive disorder, single episode, unspecified: Secondary | ICD-10-CM | POA: Diagnosis not present

## 2015-08-26 DIAGNOSIS — M542 Cervicalgia: Secondary | ICD-10-CM

## 2015-08-26 LAB — CBC WITH DIFFERENTIAL/PLATELET
BASOS ABS: 0.1 10*3/uL (ref 0.0–0.1)
Basophils Relative: 0.7 % (ref 0.0–3.0)
EOS ABS: 0.2 10*3/uL (ref 0.0–0.7)
Eosinophils Relative: 2.5 % (ref 0.0–5.0)
HEMATOCRIT: 41.1 % (ref 36.0–46.0)
HEMOGLOBIN: 13.8 g/dL (ref 12.0–15.0)
LYMPHS PCT: 26.5 % (ref 12.0–46.0)
Lymphs Abs: 1.8 10*3/uL (ref 0.7–4.0)
MCHC: 33.5 g/dL (ref 30.0–36.0)
MCV: 89.2 fl (ref 78.0–100.0)
Monocytes Absolute: 0.5 10*3/uL (ref 0.1–1.0)
Monocytes Relative: 6.6 % (ref 3.0–12.0)
Neutro Abs: 4.4 10*3/uL (ref 1.4–7.7)
Neutrophils Relative %: 63.7 % (ref 43.0–77.0)
Platelets: 315 10*3/uL (ref 150.0–400.0)
RBC: 4.61 Mil/uL (ref 3.87–5.11)
RDW: 13 % (ref 11.5–15.5)
WBC: 6.9 10*3/uL (ref 4.0–10.5)

## 2015-08-26 LAB — HEPATIC FUNCTION PANEL
ALT: 17 U/L (ref 0–35)
AST: 20 U/L (ref 0–37)
Albumin: 4.6 g/dL (ref 3.5–5.2)
Alkaline Phosphatase: 59 U/L (ref 39–117)
BILIRUBIN DIRECT: 0.1 mg/dL (ref 0.0–0.3)
TOTAL PROTEIN: 7.3 g/dL (ref 6.0–8.3)
Total Bilirubin: 0.6 mg/dL (ref 0.2–1.2)

## 2015-08-26 LAB — BASIC METABOLIC PANEL
BUN: 13 mg/dL (ref 6–23)
CO2: 31 mEq/L (ref 19–32)
Calcium: 10 mg/dL (ref 8.4–10.5)
Chloride: 100 mEq/L (ref 96–112)
Creatinine, Ser: 0.77 mg/dL (ref 0.40–1.20)
GFR: 79.46 mL/min (ref >60.00)
GLUCOSE: 100 mg/dL — AB (ref 70–99)
Potassium: 4.7 mEq/L (ref 3.5–5.1)
SODIUM: 138 meq/L (ref 135–145)

## 2015-08-26 LAB — LIPID PANEL
CHOLESTEROL: 198 mg/dL (ref 0–200)
HDL: 56.7 mg/dL (ref >39.00)
LDL CALC: 106 mg/dL — AB (ref 0–99)
NonHDL: 141.69
TRIGLYCERIDES: 179 mg/dL — AB (ref 0.0–149.0)
Total CHOL/HDL Ratio: 3
VLDL: 35.8 mg/dL (ref 0.0–40.0)

## 2015-08-26 LAB — TSH: TSH: 1.57 u[IU]/mL (ref 0.35–4.50)

## 2015-08-26 LAB — HEMOGLOBIN A1C: Hgb A1c MFr Bld: 5.8 % (ref 4.6–6.5)

## 2015-08-26 MED ORDER — ETODOLAC 400 MG PO TABS: ORAL_TABLET | ORAL | Status: DC

## 2015-08-26 MED ORDER — CYCLOBENZAPRINE HCL 5 MG PO TABS: 5.0000 mg | ORAL_TABLET | Freq: Every evening | ORAL | Status: DC | PRN

## 2015-08-26 NOTE — Progress Notes (Signed)
Patient ID: Angelica Robertson, female   DOB: 10-Sep-1946, 69 y.o.   MRN: [id number]   Subjective:    Patient ID: Angelica Robertson, female    DOB: 03/01/1946, 69 y.o.   MRN: [id number]  HPI  Patient here for a scheduled follow up.  She is here to follow up on her cholesterol and hypertension.  She also reports she is having some increased pain in her neck.  She had a flare earlier in the summer.  Went to therapy.  Was doing better, until a boat ride this week.  Aggravated her neck.  Now with some increased pain.  Is some better.  Taking tylenol and advil.  No radiation of pain.  Some headache related to the neck.  Again, pain not as bad today.  Is exercising.  No chest pain or tightness.  No sob.  No abdominal pain or cramping.  Bowels stable.  Blood pressures averagig 120-130/60-70s.     Past Medical History  Diagnosis Date  . Depression 2000  . Hypercholesterolemia Osteoporosis  . Heart murmur    Past Surgical History  Procedure Laterality Date  . Abdominal hysterectomy  1980   Family History  Problem Relation Age of Onset  . Heart disease Mother   . Diabetes Mother   . Heart disease Father   . Diabetes Father   . Colon cancer    . BRCA 1/2 Neg Hx   . Breast cancer Neg Hx   . Cancer Neg Hx   . Cowden syndrome Neg Hx   . DES usage Neg Hx   . Endometrial cancer Neg Hx   . Li-Fraumeni syndrome Neg Hx   . Ovarian cancer Neg Hx    Social History   Social History  . Marital Status: Married    Spouse Name: N/A  . Number of Children: N/A  . Years of Education: N/A   Social History Main Topics  . Smoking status: Never Smoker   . Smokeless tobacco: Never Used  . Alcohol Use: 0.0 oz/week    0 Standard drinks or equivalent per week     Comment: occasionally - socially  . Drug Use: No  . Sexual Activity: Not Currently   Other Topics Concern  . None   Social History Narrative    Outpatient Encounter Prescriptions as of 08/26/2015  Medication Sig  . calcium carbonate (OS-CAL) 600 MG  TABS tablet Take 600 mg by mouth 2 (two) times daily with a meal.  . Calcium-Magnesium-Zinc 333-133-5 MG TABS Take by mouth.  . Cholecalciferol (VITAMIN D PO) Take 250 mg by mouth 2 (two) times daily.  . sertraline (ZOLOFT) 50 MG tablet Take 1 tablet (50 mg total) by mouth daily.  . simvastatin (ZOCOR) 20 MG tablet Take 1 tablet (20 mg total) by mouth daily.  . cyclobenzaprine (FLEXERIL) 5 MG tablet Take 1 tablet (5 mg total) by mouth at bedtime as needed for muscle spasms.  Marland Kitchen etodolac (LODINE) 400 MG tablet Take with food.  . [DISCONTINUED] cyclobenzaprine (FLEXERIL) 5 MG tablet Take 1 tablet (5 mg total) by mouth at bedtime as needed for muscle spasms. (Patient not taking: Reported on 08/26/2015)   No facility-administered encounter medications on file as of 08/26/2015.    Review of Systems  Constitutional: Negative for appetite change and unexpected weight change.  HENT: Negative for congestion and sinus pressure.   Eyes: Negative for pain and visual disturbance.  Respiratory: Negative for cough, chest tightness and shortness of breath.   Cardiovascular: Negative  for chest pain, palpitations and leg swelling.  Gastrointestinal: Negative for nausea, vomiting, abdominal pain and diarrhea.  Genitourinary: Negative for dysuria and difficulty urinating.  Musculoskeletal: Positive for neck pain. Negative for back pain.  Skin: Negative for color change and rash.  Neurological: Negative for dizziness, light-headedness and headaches.  Psychiatric/Behavioral: Negative for dysphoric mood and agitation.       Objective:    Physical Exam  Constitutional: She appears well-developed and well-nourished. No distress.  HENT:  Nose: Nose normal.  Mouth/Throat: Oropharynx is clear and moist.  Eyes: Conjunctivae are normal. Right eye exhibits no discharge. Left eye exhibits no discharge.  Neck: Neck supple. No thyromegaly present.  Cardiovascular: Normal rate and regular rhythm.   Pulmonary/Chest:  Breath sounds normal. No respiratory distress. She has no wheezes.  Abdominal: Soft. Bowel sounds are normal. There is no tenderness.  Musculoskeletal: She exhibits no edema or tenderness.  Lymphadenopathy:    She has no cervical adenopathy.  Skin: No rash noted. No erythema.  Psychiatric: She has a normal mood and affect. Her behavior is normal.    BP 118/80 mmHg  Pulse 79  Temp(Src) 98.8 F (37.1 C) (Oral)  Ht 5' 3.5" (1.613 m)  Wt 138 lb 8 oz (62.823 kg)  BMI 24.15 kg/m2  SpO2 95%  LMP 11/27/1979 Wt Readings from Last 3 Encounters:  08/26/15 138 lb 8 oz (62.823 kg)  05/28/15 141 lb 9.6 oz (64.229 kg)  02/22/15 139 lb (63.05 kg)     Lab Results  Component Value Date   WBC 6.9 08/26/2015   HGB 13.8 08/26/2015   HCT 41.1 08/26/2015   PLT 315.0 08/26/2015   GLUCOSE 100* 08/26/2015   CHOL 198 08/26/2015   TRIG 179.0* 08/26/2015   HDL 56.70 08/26/2015   LDLCALC 106* 08/26/2015   ALT 17 08/26/2015   AST 20 08/26/2015   NA 138 08/26/2015   K 4.7 08/26/2015   CL 100 08/26/2015   CREATININE 0.77 08/26/2015   BUN 13 08/26/2015   CO2 31 08/26/2015   TSH 1.57 08/26/2015   HGBA1C 5.8 08/26/2015    Mm Screening Breast Tomo Bilateral  04/12/2015   CLINICAL DATA:  Screening.  EXAM: DIGITAL SCREENING BILATERAL MAMMOGRAM WITH 3D TOMO WITH CAD  COMPARISON:  Previous exam(s).  ACR Breast Density Category b: There are scattered areas of fibroglandular density.  FINDINGS: There are no findings suspicious for malignancy. Images were processed with CAD.  IMPRESSION: No mammographic evidence of malignancy. A result letter of this screening mammogram will be mailed directly to the patient.  RECOMMENDATION: Screening mammogram in one year. (Code:SM-B-01Y)  BI-RADS CATEGORY  1: Negative.   Electronically Signed   By: Margarette Canada M.D.   On: 04/12/2015 11:13       Assessment & Plan:   Problem List Items Addressed This Visit    Depression    Doing better.  On zoloft.  Follow.         Hypercholesterolemia    Low cholesterol diet and exercise.  On simvastatin.  Follow lipid panel and liver function tests.        Relevant Orders   CBC with Differential/Platelet (Completed)   TSH (Completed)   Lipid panel (Completed)   Hepatic function panel (Completed)   Hyperglycemia    Low carb diet and exercise.  Follow met b and a1c.        Relevant Orders   Hemoglobin A1c (Completed)   Basic metabolic panel (Completed)   Neck pain  Has a history of neck issues.  Recent flare.  Flexeril as directed.  lodine as directed.  Discussed side effects and possible risk of lodine.  Follow.        Osteoporosis    Previous noted improvement in her bone density.  Continue calcium, vitamin D and weight bearing exercise.         Other Visit Diagnoses    Encounter for immunization    -  Primary        Einar Pheasant, MD

## 2015-08-26 NOTE — Telephone Encounter (Signed)
Spoke with pharmacist, advised of Etodolac instructions. Read back

## 2015-08-26 NOTE — Telephone Encounter (Signed)
Pharmacist called requesting directions on Etodolac Rx.  Please advise clarification

## 2015-08-26 NOTE — Progress Notes (Signed)
Pre-visit discussion using our clinic review tool. No additional management support is needed unless otherwise documented below in the visit note.  

## 2015-08-26 NOTE — Telephone Encounter (Signed)
One tablet bid prn #20 with no refills.

## 2015-08-27 ENCOUNTER — Encounter: Payer: Self-pay | Admitting: Internal Medicine

## 2015-08-28 ENCOUNTER — Encounter: Payer: Self-pay | Admitting: Internal Medicine

## 2015-08-28 NOTE — Assessment & Plan Note (Signed)
Low cholesterol diet and exercise.  On simvastatin.  Follow lipid panel and liver function tests.   

## 2015-08-28 NOTE — Assessment & Plan Note (Signed)
Low carb diet and exercise.  Follow met b and a1c.   

## 2015-08-28 NOTE — Assessment & Plan Note (Signed)
Doing better.  On zoloft.  Follow.  

## 2015-08-28 NOTE — Assessment & Plan Note (Signed)
Has a history of neck issues.  Recent flare.  Flexeril as directed.  lodine as directed.  Discussed side effects and possible risk of lodine.  Follow.

## 2015-08-28 NOTE — Assessment & Plan Note (Signed)
Previous noted improvement in her bone density.  Continue calcium, vitamin D and weight bearing exercise.

## 2015-09-21 ENCOUNTER — Encounter: Payer: Self-pay | Admitting: Internal Medicine

## 2015-09-22 MED ORDER — CYCLOBENZAPRINE HCL 5 MG PO TABS: 5.0000 mg | ORAL_TABLET | Freq: Every evening | ORAL | Status: DC | PRN

## 2015-09-22 NOTE — Telephone Encounter (Signed)
rx refill sent in for flexeril #20 with no refills.

## 2015-09-28 ENCOUNTER — Encounter: Payer: Self-pay | Admitting: Internal Medicine

## 2015-09-28 DIAGNOSIS — M542 Cervicalgia: Secondary | ICD-10-CM

## 2015-09-28 NOTE — Telephone Encounter (Signed)
Order placed for physical therapy referral.   

## 2015-10-07 DIAGNOSIS — M542 Cervicalgia: Secondary | ICD-10-CM | POA: Diagnosis not present

## 2015-10-12 DIAGNOSIS — M542 Cervicalgia: Secondary | ICD-10-CM | POA: Diagnosis not present

## 2015-10-14 DIAGNOSIS — M542 Cervicalgia: Secondary | ICD-10-CM | POA: Diagnosis not present

## 2015-10-19 DIAGNOSIS — M542 Cervicalgia: Secondary | ICD-10-CM | POA: Diagnosis not present

## 2015-10-21 DIAGNOSIS — M542 Cervicalgia: Secondary | ICD-10-CM | POA: Diagnosis not present

## 2015-10-27 DIAGNOSIS — M542 Cervicalgia: Secondary | ICD-10-CM | POA: Diagnosis not present

## 2015-11-03 DIAGNOSIS — M542 Cervicalgia: Secondary | ICD-10-CM | POA: Diagnosis not present

## 2015-12-01 ENCOUNTER — Other Ambulatory Visit: Payer: Self-pay | Admitting: Internal Medicine

## 2015-12-02 NOTE — Telephone Encounter (Signed)
I do not mind refilling the medication, but I need to know how her neck is doing.  Since she is asking for a refill, just need to confirm doing better.

## 2015-12-03 NOTE — Telephone Encounter (Signed)
See attached messaging before refilling.  Thanks

## 2015-12-03 NOTE — Telephone Encounter (Signed)
rx sent in for her muscle relaxer.   Please notify pt.  Thanks

## 2015-12-03 NOTE — Telephone Encounter (Signed)
See message below °

## 2015-12-03 NOTE — Telephone Encounter (Signed)
Patient made aware if prescription at pharmacy.

## 2015-12-03 NOTE — Telephone Encounter (Signed)
Patient stated she as 8 pills left takes the medication when shes having a bad day with her neck pain.

## 2016-01-03 ENCOUNTER — Telehealth: Payer: Self-pay | Admitting: Internal Medicine

## 2016-01-03 NOTE — Telephone Encounter (Signed)
Left msg to call office to schedule AWV/msn °

## 2016-01-04 ENCOUNTER — Encounter: Payer: Self-pay | Admitting: Internal Medicine

## 2016-01-05 NOTE — Telephone Encounter (Signed)
Please advise 

## 2016-01-25 ENCOUNTER — Other Ambulatory Visit: Payer: Self-pay | Admitting: Internal Medicine

## 2016-01-31 ENCOUNTER — Ambulatory Visit: Payer: Self-pay

## 2016-02-03 ENCOUNTER — Ambulatory Visit (INDEPENDENT_AMBULATORY_CARE_PROVIDER_SITE_OTHER): Payer: Medicare Other

## 2016-02-03 VITALS — BP 132/72 | HR 75 | Temp 98.1°F | Resp 12 | Ht 64.0 in | Wt 137.4 lb

## 2016-02-03 DIAGNOSIS — Z1159 Encounter for screening for other viral diseases: Secondary | ICD-10-CM

## 2016-02-03 DIAGNOSIS — Z0000 Encounter for general adult medical examination without abnormal findings: Secondary | ICD-10-CM | POA: Diagnosis not present

## 2016-02-03 NOTE — Patient Instructions (Addendum)
  Angelica Robertson , Thank you for taking time to come for your Medicare Wellness Visit. I appreciate your ongoing commitment to your health goals. Please review the following plan we discussed and let me know if I can assist you in the future.     This is a list of the screening recommended for you and due dates:  Health Maintenance  Topic Date Due  .  Hepatitis C: One time screening is recommended by Center for Disease Control  (CDC) for  adults born from [birth year] through 1963.   Jul 18, 1946  . Tetanus Vaccine  08/07/1967  . Shingles Vaccine  08/06/2008  . Pneumonia vaccines (2 of 2 - PPSV23) 11/25/2014  . Mammogram  04/11/2016  . Flu Shot  07/11/2016  . Colon Cancer Screening  08/20/2022  . DEXA scan (bone density measurement)  Completed

## 2016-02-03 NOTE — Progress Notes (Signed)
Subjective:   Angelica Robertson is a 70 y.o. female who presents for an Initial Medicare Annual Wellness Visit.  Review of Systems    No ROS.  Medicare Wellness Visit.  Cardiac Risk Factors include: advanced age (>68mn, >>57women)     Objective:    Today's Vitals   02/03/16 1301  BP: 132/72  Pulse: 75  Temp: 98.1 F (36.7 C)  TempSrc: Oral  Resp: 12  Height: 5' 4" (1.626 m)  Weight: 137 lb 6.4 oz (62.324 kg)  SpO2: 96%    Current Medications (verified) Outpatient Encounter Prescriptions as of 02/03/2016  Medication Sig  . calcium carbonate (OS-CAL) 600 MG TABS tablet Take 600 mg by mouth 2 (two) times daily with a meal.  . Calcium-Magnesium-Zinc 333-133-5 MG TABS Take by mouth.  . Cholecalciferol (VITAMIN D PO) Take 250 mg by mouth 2 (two) times daily.  . cyclobenzaprine (FLEXERIL) 5 MG tablet TAKE 1 TABLET BY MOUTH AT BEDTIME AS NEEDED FOR MUSCLE SPASMS.  . Misc Natural Products (GLUCOSAMINE CHONDROITIN VIT D3 PO) Take 1 capsule by mouth.  . sertraline (ZOLOFT) 50 MG tablet TAKE 1 TABLET BY MOUTH ONCE DAILY.  . simvastatin (ZOCOR) 20 MG tablet Take 1 tablet (20 mg total) by mouth daily.  . [DISCONTINUED] etodolac (LODINE) 400 MG tablet Take with food.   No facility-administered encounter medications on file as of 02/03/2016.    Allergies (verified) Review of patient's allergies indicates no known allergies.   History: Past Medical History  Diagnosis Date  . Depression 2000  . Hypercholesterolemia Osteoporosis  . Heart murmur    Past Surgical History  Procedure Laterality Date  . Abdominal hysterectomy  1980   Family History  Problem Relation Age of Onset  . Heart disease Mother   . Diabetes Mother   . Heart disease Father   . Diabetes Father   . Colon cancer    . BRCA 1/2 Neg Hx   . Breast cancer Neg Hx   . Cancer Neg Hx   . Cowden syndrome Neg Hx   . DES usage Neg Hx   . Endometrial cancer Neg Hx   . Li-Fraumeni syndrome Neg Hx   . Ovarian cancer Neg  Hx    Social History   Occupational History  . Not on file.   Social History Main Topics  . Smoking status: Never Smoker   . Smokeless tobacco: Never Used  . Alcohol Use: 0.0 oz/week    0 Standard drinks or equivalent per week     Comment: occasionally - socially  . Drug Use: No  . Sexual Activity: Yes    Tobacco Counseling Counseling given: Not Answered   Activities of Daily Living In your present state of health, do you have any difficulty performing the following activities: 02/03/2016  Hearing? N  Vision? N  Difficulty concentrating or making decisions? N  Walking or climbing stairs? N  Dressing or bathing? N  Doing errands, shopping? N  Preparing Food and eating ? N  Using the Toilet? N  In the past six months, have you accidently leaked urine? Y  Do you have problems with loss of bowel control? N  Managing your Medications? N  Managing your Finances? N  Housekeeping or managing your Housekeeping? N    Immunizations and Health Maintenance Immunization History  Administered Date(s) Administered  . Influenza Nasal 09/09/2012  . Influenza Split 01/07/2013, 09/15/2013  . Influenza,inj,Quad PF,36+ Mos 08/13/2014, 08/26/2015  . Pneumococcal Conjugate-13 11/25/2013   Health  Maintenance Due  Topic Date Due  . Hepatitis C Screening  Aug 03, 1946  . TETANUS/TDAP  08/07/1967  . ZOSTAVAX  08/06/2008  . PNA vac Low Risk Adult (2 of 2 - PPSV23) 11/25/2014    Patient Care Team: Einar Pheasant, MD as PCP - General (Internal Medicine)  Indicate any recent Medical Services you may have received from other than [facility] providers in the past year (date may be approximate).     Assessment:   This is a routine wellness examination for Angelica Robertson. The goal of the wellness visit is to assist the patient how to close the gaps in care and create a preventative care plan for the patient.   Taking VIT D Calcium as appropriate/Osteoporosis risk reviewed.  Medications reviewed; taking  without issues or barriers.  Safety issues reviewed; smoke and carbon monoxide detectors in the home.  No firearms in the home. Wears seatbelts when driving or riding with others. No violence in the home.  No identified risk were noted; The patient was oriented x 3; appropriate in dress and manner and no objective failures at ADL's or IADL's.   TDAP and ZOSTAVAX vaccine postponed per patient request; follow up with insurance.  Educational material provided.  Hepatitis C screening and PNA 23 vaccine postponed for upcoming physical, per patient request.  Educational material provided.  Patient Concerns:  S/S of UTI 2 weeks ago; seems to have resolved.  Declined follow up appointment at this time.  Encouraged to follow up with PCP as needed.    Hearing/Vision screen Hearing Screening Comments: Passes the whisper test Vision Screening Comments: Followed by Dr. Graciella Belton, [place] Wears glasses Annual visits  Dietary issues and exercise activities discussed: Current Exercise Habits:: Structured exercise class, Type of exercise: treadmill;calisthenics;strength training/weights, Time (Minutes): 60, Frequency (Times/Week): 5, Weekly Exercise (Minutes/Week): 300, Intensity: Moderate  Goals    . Healthy Lifestyle     Maintain exercise regiment Stay hydrated! Drink plenty of water Choose low carb foods, fruits and vegetables      Depression Screen PHQ 2/9 Scores 02/03/2016 02/22/2015 06/11/2014 11/25/2013  PHQ - 2 Score 0 0 0 0    Fall Risk Fall Risk  02/03/2016 02/22/2015 06/11/2014 11/25/2013  Falls in the past year? No No No No    Cognitive Function: MMSE - Mini Mental State Exam 02/03/2016  Orientation to time 5  Orientation to Place 5  Registration 3  Attention/ Calculation 5  Recall 3  Language- name 2 objects 2  Language- repeat 1  Language- follow 3 step command 3  Language- read & follow direction 1  Write a sentence 1  Copy design 1  Total score 30    Screening  Tests Health Maintenance  Topic Date Due  . Hepatitis C Screening  12/18/[birth year]  . TETANUS/TDAP  08/07/1967  . ZOSTAVAX  08/06/2008  . PNA vac Low Risk Adult (2 of 2 - PPSV23) 11/25/2014  . MAMMOGRAM  04/11/2016  . INFLUENZA VACCINE  07/11/2016  . COLONOSCOPY  08/20/2022  . DEXA SCAN  Completed      Plan:   End of life planning; Advance aging; Advanced directives discussed. Copy of current HCPOA/Living Will requested.    During the course of the visit, Angelica Robertson was educated and counseled about the following appropriate screening and preventive services:   Vaccines to include Pneumoccal, Influenza, Hepatitis B, Td, Zostavax, HCV  Electrocardiogram  Cardiovascular disease screening  Colorectal cancer screening  Bone density screening  Diabetes screening  Glaucoma screening  Mammography/PAP  Nutrition counseling  Smoking cessation counseling  Patient Instructions (the written plan) were given to the patient.    Varney Biles, LPN   06/13/8888    Reviewed above information.  Agree with plan.    Dr Nicki Reaper

## 2016-02-24 ENCOUNTER — Ambulatory Visit (INDEPENDENT_AMBULATORY_CARE_PROVIDER_SITE_OTHER): Payer: Medicare Other | Admitting: Internal Medicine

## 2016-02-24 ENCOUNTER — Encounter: Payer: Self-pay | Admitting: Internal Medicine

## 2016-02-24 VITALS — BP 132/78 | HR 72 | Temp 98.3°F | Resp 20 | Ht 64.0 in | Wt 138.0 lb

## 2016-02-24 DIAGNOSIS — F32A Depression, unspecified: Secondary | ICD-10-CM

## 2016-02-24 DIAGNOSIS — Z23 Encounter for immunization: Secondary | ICD-10-CM

## 2016-02-24 DIAGNOSIS — M810 Age-related osteoporosis without current pathological fracture: Secondary | ICD-10-CM

## 2016-02-24 DIAGNOSIS — Z0000 Encounter for general adult medical examination without abnormal findings: Secondary | ICD-10-CM | POA: Diagnosis not present

## 2016-02-24 DIAGNOSIS — M542 Cervicalgia: Secondary | ICD-10-CM

## 2016-02-24 DIAGNOSIS — R739 Hyperglycemia, unspecified: Secondary | ICD-10-CM

## 2016-02-24 DIAGNOSIS — E7800 Pure hypercholesterolemia, unspecified: Secondary | ICD-10-CM | POA: Diagnosis not present

## 2016-02-24 DIAGNOSIS — Z1159 Encounter for screening for other viral diseases: Secondary | ICD-10-CM

## 2016-02-24 DIAGNOSIS — Z1239 Encounter for other screening for malignant neoplasm of breast: Secondary | ICD-10-CM

## 2016-02-24 DIAGNOSIS — F329 Major depressive disorder, single episode, unspecified: Secondary | ICD-10-CM

## 2016-02-24 LAB — TSH: TSH: 1.21 u[IU]/mL (ref 0.35–4.50)

## 2016-02-24 LAB — BASIC METABOLIC PANEL
BUN: 15 mg/dL (ref 6–23)
CALCIUM: 10 mg/dL (ref 8.4–10.5)
CHLORIDE: 100 meq/L (ref 96–112)
CO2: 31 meq/L (ref 19–32)
CREATININE: 0.7 mg/dL (ref 0.40–1.20)
GFR: 88.56 mL/min (ref >60.00)
GLUCOSE: 101 mg/dL — AB (ref 70–99)
Potassium: 4.3 mEq/L (ref 3.5–5.1)
SODIUM: 139 meq/L (ref 135–145)

## 2016-02-24 LAB — LIPID PANEL
Cholesterol: 196 mg/dL (ref 0–200)
HDL: 60.6 mg/dL (ref >39.00)
LDL Cholesterol: 112 mg/dL — ABNORMAL HIGH (ref 0–99)
NonHDL: 135.28
TRIGLYCERIDES: 116 mg/dL (ref 0.0–149.0)
Total CHOL/HDL Ratio: 3
VLDL: 23.2 mg/dL (ref 0.0–40.0)

## 2016-02-24 LAB — HEPATIC FUNCTION PANEL
ALK PHOS: 60 U/L (ref 39–117)
ALT: 22 U/L (ref 0–35)
AST: 21 U/L (ref 0–37)
Albumin: 4.5 g/dL (ref 3.5–5.2)
BILIRUBIN DIRECT: 0.1 mg/dL (ref 0.0–0.3)
BILIRUBIN TOTAL: 0.6 mg/dL (ref 0.2–1.2)
TOTAL PROTEIN: 6.8 g/dL (ref 6.0–8.3)

## 2016-02-24 LAB — HEPATITIS C ANTIBODY: HCV Ab: NEGATIVE

## 2016-02-24 LAB — HEMOGLOBIN A1C: Hgb A1c MFr Bld: 6.1 % (ref 4.6–6.5)

## 2016-02-24 LAB — VITAMIN D 25 HYDROXY (VIT D DEFICIENCY, FRACTURES): VITD: 37.01 ng/mL (ref 30.00–100.00)

## 2016-02-24 NOTE — Assessment & Plan Note (Signed)
Physical today 02/24/16.  mammogram 04/12/15 - Birads I.  colonoscopy 08/20/12.

## 2016-02-24 NOTE — Progress Notes (Signed)
Patient ID: Angelica Robertson, female   DOB: September 01, 1946, 70 y.o.   MRN: [id number]   Subjective:    Patient ID: Angelica Robertson, female    DOB: 10-14-1946, 70 y.o.   MRN: [id number]  HPI  Patient with past history of hypercholesterolemia and depression.  She comes in today for her physical exam.  She is still having neck issues.  Has been to therapy.  Still does exercise.  Takes alleve.  Persistent pain.  Has to be careful lifting.  Would like to look into other options.  Stays active.  No cardiac symptoms with increased activity or exertion.  No sob.  No acid reflux.  No abdominal pain or cramping.  Bowels stable.  Handling stress well.  Blood pressures averaging 120-130/60-70s.     Past Medical History  Diagnosis Date  . Depression 2000  . Hypercholesterolemia Osteoporosis  . Heart murmur    Past Surgical History  Procedure Laterality Date  . Abdominal hysterectomy  1980   Family History  Problem Relation Age of Onset  . Heart disease Mother   . Diabetes Mother   . Heart disease Father   . Diabetes Father   . Colon cancer    . BRCA 1/2 Neg Hx   . Breast cancer Neg Hx   . Cancer Neg Hx   . Cowden syndrome Neg Hx   . DES usage Neg Hx   . Endometrial cancer Neg Hx   . Li-Fraumeni syndrome Neg Hx   . Ovarian cancer Neg Hx    Social History   Social History  . Marital Status: Married    Spouse Name: N/A  . Number of Children: N/A  . Years of Education: N/A   Social History Main Topics  . Smoking status: Never Smoker   . Smokeless tobacco: Never Used  . Alcohol Use: 0.0 oz/week    0 Standard drinks or equivalent per week     Comment: occasionally - socially  . Drug Use: No  . Sexual Activity: Yes   Other Topics Concern  . None   Social History Narrative    Outpatient Encounter Prescriptions as of 02/24/2016  Medication Sig  . calcium carbonate (OS-CAL) 600 MG TABS tablet Take 600 mg by mouth 2 (two) times daily with a meal.  . Calcium-Magnesium-Zinc 333-133-5 MG TABS  Take by mouth.  . Cholecalciferol (VITAMIN D PO) Take 250 mg by mouth 2 (two) times daily.  . cyclobenzaprine (FLEXERIL) 5 MG tablet TAKE 1 TABLET BY MOUTH AT BEDTIME AS NEEDED FOR MUSCLE SPASMS.  . Misc Natural Products (GLUCOSAMINE CHONDROITIN VIT D3 PO) Take 1 capsule by mouth.  . sertraline (ZOLOFT) 50 MG tablet TAKE 1 TABLET BY MOUTH ONCE DAILY.  . simvastatin (ZOCOR) 20 MG tablet Take 1 tablet (20 mg total) by mouth daily.   No facility-administered encounter medications on file as of 02/24/2016.    Review of Systems  Constitutional: Negative for appetite change and unexpected weight change.  HENT: Negative for congestion and sinus pressure.   Respiratory: Negative for cough, chest tightness and shortness of breath.   Cardiovascular: Negative for chest pain, palpitations and leg swelling.  Gastrointestinal: Negative for nausea, vomiting, abdominal pain and diarrhea.  Genitourinary: Negative for dysuria and difficulty urinating.  Musculoskeletal: Positive for neck pain. Negative for back pain and joint swelling.  Skin: Negative for color change and rash.  Neurological: Negative for dizziness, light-headedness and headaches.  Psychiatric/Behavioral: Negative for dysphoric mood, decreased concentration and agitation.  Objective:     Blood pressure rechecked by me:  132/78  Physical Exam  Constitutional: She is oriented to person, place, and time. She appears well-developed and well-nourished. No distress.  HENT:  Nose: Nose normal.  Mouth/Throat: Oropharynx is clear and moist.  Eyes: Right eye exhibits no discharge. Left eye exhibits no discharge. No scleral icterus.  Neck: Neck supple. No thyromegaly present.  Cardiovascular: Normal rate and regular rhythm.   Pulmonary/Chest: Breath sounds normal. No accessory muscle usage. No tachypnea. No respiratory distress. She has no decreased breath sounds. She has no wheezes. She has no rhonchi. Right breast exhibits no inverted  nipple, no mass, no nipple discharge and no tenderness (no axillary adenopathy). Left breast exhibits no inverted nipple, no mass, no nipple discharge and no tenderness (no axilarry adenopathy).  Abdominal: Soft. Bowel sounds are normal. There is no tenderness.  Musculoskeletal: She exhibits no edema or tenderness.  Lymphadenopathy:    She has no cervical adenopathy.  Neurological: She is alert and oriented to person, place, and time.  Skin: Skin is warm. No rash noted. No erythema.  Psychiatric: She has a normal mood and affect. Her behavior is normal.    BP 132/78 mmHg  Pulse 72  Temp(Src) 98.3 F (36.8 C) (Oral)  Resp 20  Ht 5' 4"  (1.626 m)  Wt 138 lb (62.596 kg)  BMI 23.68 kg/m2  SpO2 96%  LMP 11/27/1979 Wt Readings from Last 3 Encounters:  02/24/16 138 lb (62.596 kg)  02/03/16 137 lb 6.4 oz (62.324 kg)  08/26/15 138 lb 8 oz (62.823 kg)     Lab Results  Component Value Date   WBC 6.9 08/26/2015   HGB 13.8 08/26/2015   HCT 41.1 08/26/2015   PLT 315.0 08/26/2015   GLUCOSE 101* 02/24/2016   CHOL 196 02/24/2016   TRIG 116.0 02/24/2016   HDL 60.60 02/24/2016   LDLCALC 112* 02/24/2016   ALT 22 02/24/2016   AST 21 02/24/2016   NA 139 02/24/2016   K 4.3 02/24/2016   CL 100 02/24/2016   CREATININE 0.70 02/24/2016   BUN 15 02/24/2016   CO2 31 02/24/2016   TSH 1.21 02/24/2016   HGBA1C 6.1 02/24/2016    Mm Screening Breast Tomo Bilateral  04/12/2015  CLINICAL DATA:  Screening. EXAM: DIGITAL SCREENING BILATERAL MAMMOGRAM WITH 3D TOMO WITH CAD COMPARISON:  Previous exam(s). ACR Breast Density Category b: There are scattered areas of fibroglandular density. FINDINGS: There are no findings suspicious for malignancy. Images were processed with CAD. IMPRESSION: No mammographic evidence of malignancy. A result letter of this screening mammogram will be mailed directly to the patient. RECOMMENDATION: Screening mammogram in one year. (Code:SM-B-01Y) BI-RADS CATEGORY  1: Negative.  Electronically Signed   By: Margarette Canada M.D.   On: 04/12/2015 11:13       Assessment & Plan:   Problem List Items Addressed This Visit    Depression    Doing better.  On zoloft.  Follow.        Health care maintenance    Physical today 02/24/16.  mammogram 04/12/15 - Birads I.  colonoscopy 08/20/12.        Hypercholesterolemia    Low cholesterol diet and exercise.  Follow lipid panel and liver function tests.  On simvastatin.        Relevant Orders   Lipid panel (Completed)   Hepatic function panel (Completed)   TSH (Completed)   Hyperglycemia    Low carb diet and exercise.  Follow met b and  a1c.        Relevant Orders   Hemoglobin A1c (Completed)   Basic metabolic panel (Completed)   Neck pain - Primary    Neck pain persistent.  Has been to therapy.  Exercises.  Has tried prn flexeril.  Persistent issues.  Refer to Dr Sharlet Salina for evaluation and further treatment options.        Relevant Orders   Ambulatory referral to Orthopedic Surgery   Osteoporosis    Continue calcium and vitamin D.  Schedule f/u bone density when due.        Relevant Orders   VITAMIN D 25 Hydroxy (Vit-D Deficiency, Fractures) (Completed)    Other Visit Diagnoses    Breast cancer screening        Relevant Orders    MM DIGITAL SCREENING BILATERAL    Need for hepatitis C screening test        Need for prophylactic vaccination against Streptococcus pneumoniae (pneumococcus)        Relevant Orders    Pneumococcal polysaccharide vaccine 23-valent greater than or equal to 2yo subcutaneous/IM (Completed)        Einar Pheasant, MD

## 2016-02-24 NOTE — Progress Notes (Signed)
Pre visit review using our clinic review tool, if applicable. No additional management support is needed unless otherwise documented below in the visit note. 

## 2016-02-25 ENCOUNTER — Encounter: Payer: Self-pay | Admitting: Internal Medicine

## 2016-02-27 ENCOUNTER — Encounter: Payer: Self-pay | Admitting: Internal Medicine

## 2016-02-27 NOTE — Assessment & Plan Note (Signed)
Doing better.  On zoloft.  Follow.  

## 2016-02-27 NOTE — Assessment & Plan Note (Signed)
Neck pain persistent.  Has been to therapy.  Exercises.  Has tried prn flexeril.  Persistent issues.  Refer to Dr Yves Dillhasnis for evaluation and further treatment options.

## 2016-02-27 NOTE — Assessment & Plan Note (Signed)
Continue calcium and vitamin D.  Schedule f/u bone density when due.

## 2016-02-27 NOTE — Assessment & Plan Note (Signed)
Low cholesterol diet and exercise.  Follow lipid panel and liver function tests.  On simvastatin.   

## 2016-02-27 NOTE — Assessment & Plan Note (Signed)
Low carb diet and exercise.  Follow met b and a1c.   

## 2016-04-17 ENCOUNTER — Other Ambulatory Visit: Payer: Self-pay | Admitting: Internal Medicine

## 2016-04-17 ENCOUNTER — Ambulatory Visit
Admission: RE | Admit: 2016-04-17 | Discharge: 2016-04-17 | Disposition: A | Discharge status: Home / Self Care | Payer: Medicare Other | Source: Ambulatory Visit | Attending: Internal Medicine | Admitting: Internal Medicine

## 2016-04-17 DIAGNOSIS — Z1239 Encounter for other screening for malignant neoplasm of breast: Secondary | ICD-10-CM

## 2016-04-17 DIAGNOSIS — Z1231 Encounter for screening mammogram for malignant neoplasm of breast: Secondary | ICD-10-CM

## 2016-04-25 ENCOUNTER — Other Ambulatory Visit: Payer: Self-pay | Admitting: Internal Medicine

## 2016-04-26 MED ORDER — SERTRALINE HCL 50 MG PO TABS: 50.0000 mg | ORAL_TABLET | Freq: Every day | ORAL | Status: DC

## 2016-04-26 NOTE — Telephone Encounter (Signed)
ok'd refill zoloft #90 with 1 refill.

## 2016-04-26 NOTE — Addendum Note (Signed)
Addended by: Charm BargesSCOTT, Raahil Ong S on: 04/26/2016 05:29 AM   Modules accepted: Orders

## 2016-07-24 ENCOUNTER — Other Ambulatory Visit: Payer: Self-pay | Admitting: Internal Medicine

## 2016-08-30 ENCOUNTER — Ambulatory Visit: Payer: Self-pay | Admitting: Internal Medicine

## 2016-10-20 ENCOUNTER — Ambulatory Visit: Payer: Self-pay | Admitting: Internal Medicine

## 2016-10-20 ENCOUNTER — Other Ambulatory Visit: Payer: Self-pay

## 2016-10-20 ENCOUNTER — Encounter: Payer: Self-pay | Admitting: Internal Medicine

## 2016-10-20 MED ORDER — SERTRALINE HCL 50 MG PO TABS: 50.0000 mg | ORAL_TABLET | Freq: Every day | ORAL | 1 refills | Status: DC

## 2016-10-20 MED ORDER — SIMVASTATIN 20 MG PO TABS: 20.0000 mg | ORAL_TABLET | Freq: Every day | ORAL | 1 refills | Status: DC

## 2016-11-10 ENCOUNTER — Ambulatory Visit (INDEPENDENT_AMBULATORY_CARE_PROVIDER_SITE_OTHER): Payer: Medicare Other | Admitting: Internal Medicine

## 2016-11-10 ENCOUNTER — Encounter: Payer: Self-pay | Admitting: Internal Medicine

## 2016-11-10 VITALS — BP 112/80 | HR 80 | Temp 98.4°F | Ht 64.0 in | Wt 139.0 lb

## 2016-11-10 DIAGNOSIS — M542 Cervicalgia: Secondary | ICD-10-CM | POA: Diagnosis not present

## 2016-11-10 DIAGNOSIS — F329 Major depressive disorder, single episode, unspecified: Secondary | ICD-10-CM

## 2016-11-10 DIAGNOSIS — F32A Depression, unspecified: Secondary | ICD-10-CM

## 2016-11-10 DIAGNOSIS — E7800 Pure hypercholesterolemia, unspecified: Secondary | ICD-10-CM

## 2016-11-10 DIAGNOSIS — R739 Hyperglycemia, unspecified: Secondary | ICD-10-CM

## 2016-11-10 DIAGNOSIS — M810 Age-related osteoporosis without current pathological fracture: Secondary | ICD-10-CM | POA: Diagnosis not present

## 2016-11-10 LAB — BASIC METABOLIC PANEL
BUN: 15 mg/dL (ref 6–23)
CALCIUM: 10.1 mg/dL (ref 8.4–10.5)
CHLORIDE: 100 meq/L (ref 96–112)
CO2: 30 meq/L (ref 19–32)
Creatinine, Ser: 0.79 mg/dL (ref 0.40–1.20)
GFR: 76.86 mL/min (ref >60.00)
Glucose, Bld: 84 mg/dL (ref 70–99)
POTASSIUM: 4.2 meq/L (ref 3.5–5.1)
SODIUM: 139 meq/L (ref 135–145)

## 2016-11-10 LAB — HEPATIC FUNCTION PANEL
ALT: 19 U/L (ref 0–35)
AST: 20 U/L (ref 0–37)
Albumin: 4.6 g/dL (ref 3.5–5.2)
Alkaline Phosphatase: 59 U/L (ref 39–117)
BILIRUBIN DIRECT: 0.1 mg/dL (ref 0.0–0.3)
BILIRUBIN TOTAL: 0.6 mg/dL (ref 0.2–1.2)
TOTAL PROTEIN: 6.9 g/dL (ref 6.0–8.3)

## 2016-11-10 LAB — LIPID PANEL
CHOL/HDL RATIO: 3
Cholesterol: 198 mg/dL (ref 0–200)
HDL: 61.6 mg/dL (ref >39.00)
LDL CALC: 113 mg/dL — AB (ref 0–99)
NONHDL: 135.98
Triglycerides: 115 mg/dL (ref 0.0–149.0)
VLDL: 23 mg/dL (ref 0.0–40.0)

## 2016-11-10 LAB — HEMOGLOBIN A1C: Hgb A1c MFr Bld: 5.9 % (ref 4.6–6.5)

## 2016-11-10 LAB — VITAMIN D 25 HYDROXY (VIT D DEFICIENCY, FRACTURES): VITD: 46.76 ng/mL (ref 30.00–100.00)

## 2016-11-10 NOTE — Progress Notes (Signed)
Patient ID: Angelica Robertson, female   DOB: 12-24-1945, 70 y.o.   MRN: [id number]   Subjective:    Patient ID: Angelica Robertson, female    DOB: 1946/04/04, 70 y.o.   MRN: [id number]  HPI  Patient here for a scheduled follow up.  She is doing well.  Feels good.  No chest pain.  No sob.  Still with some neck issues, but improved.  Exercising.  Handling stress.  No nausea or vomiting.  Bowels stable.     Past Medical History:  Diagnosis Date  . Depression 2000  . Heart murmur   . Hypercholesterolemia Osteoporosis   Past Surgical History:  Procedure Laterality Date  . ABDOMINAL HYSTERECTOMY  1980   Family History  Problem Relation Age of Onset  . Heart disease Mother   . Diabetes Mother   . Heart disease Father   . Diabetes Father   . Colon cancer    . BRCA 1/2 Neg Hx   . Breast cancer Neg Hx   . Cancer Neg Hx   . Cowden syndrome Neg Hx   . DES usage Neg Hx   . Endometrial cancer Neg Hx   . Li-Fraumeni syndrome Neg Hx   . Ovarian cancer Neg Hx    Social History   Social History  . Marital status: Married    Spouse name: N/A  . Number of children: N/A  . Years of education: N/A   Social History Main Topics  . Smoking status: Never Smoker  . Smokeless tobacco: Never Used  . Alcohol use 0.0 oz/week     Comment: occasionally - socially  . Drug use: No  . Sexual activity: Yes   Other Topics Concern  . None   Social History Narrative  . None    Outpatient Encounter Prescriptions as of 11/10/2016  Medication Sig  . calcium carbonate (OS-CAL) 600 MG TABS tablet Take 600 mg by mouth 2 (two) times daily with a meal.  . Calcium-Magnesium-Zinc 333-133-5 MG TABS Take by mouth.  . Cholecalciferol (VITAMIN D PO) Take 250 mg by mouth 2 (two) times daily.  . cyclobenzaprine (FLEXERIL) 5 MG tablet TAKE 1 TABLET BY MOUTH AT BEDTIME AS NEEDED FOR MUSCLE SPASMS.  . Misc Natural Products (GLUCOSAMINE CHONDROITIN VIT D3 PO) Take 1 capsule by mouth.  . sertraline (ZOLOFT) 50 MG tablet Take  1 tablet (50 mg total) by mouth daily.  . simvastatin (ZOCOR) 20 MG tablet Take 1 tablet (20 mg total) by mouth daily.   No facility-administered encounter medications on file as of 11/10/2016.     Review of Systems  Constitutional: Negative for appetite change and unexpected weight change.  HENT: Negative for congestion and sinus pressure.   Respiratory: Negative for cough, chest tightness and shortness of breath.   Cardiovascular: Negative for chest pain, palpitations and leg swelling.  Gastrointestinal: Negative for abdominal pain, diarrhea, nausea and vomiting.  Genitourinary: Negative for difficulty urinating and dysuria.  Musculoskeletal: Negative for joint swelling and myalgias.       Neck pain improved.    Skin: Negative for color change and rash.  Neurological: Negative for dizziness, light-headedness and headaches.  Psychiatric/Behavioral: Negative for agitation and dysphoric mood.       Objective:     Blood pressure rechecked by me:  130/78  Physical Exam  Constitutional: She appears well-developed and well-nourished. No distress.  HENT:  Nose: Nose normal.  Mouth/Throat: Oropharynx is clear and moist.  Neck: Neck supple. No thyromegaly present.  Cardiovascular: Normal rate and regular rhythm.   Pulmonary/Chest: Breath sounds normal. No respiratory distress. She has no wheezes.  Abdominal: Soft. Bowel sounds are normal. There is no tenderness.  Musculoskeletal: She exhibits no edema or tenderness.  Lymphadenopathy:    She has no cervical adenopathy.  Skin: No rash noted. No erythema.  Psychiatric: She has a normal mood and affect. Her behavior is normal.    BP 112/80   Pulse 80   Temp 98.4 F (36.9 C) (Oral)   Ht 5' 4"  (1.626 m)   Wt 139 lb (63 kg)   LMP 11/27/1979   SpO2 98%   BMI 23.86 kg/m  Wt Readings from Last 3 Encounters:  11/10/16 139 lb (63 kg)  02/24/16 138 lb (62.6 kg)  02/03/16 137 lb 6.4 oz (62.3 kg)     Lab Results  Component Value  Date   WBC 6.9 08/26/2015   HGB 13.8 08/26/2015   HCT 41.1 08/26/2015   PLT 315.0 08/26/2015   GLUCOSE 84 11/10/2016   CHOL 198 11/10/2016   TRIG 115.0 11/10/2016   HDL 61.60 11/10/2016   LDLCALC 113 (H) 11/10/2016   ALT 19 11/10/2016   AST 20 11/10/2016   NA 139 11/10/2016   K 4.2 11/10/2016   CL 100 11/10/2016   CREATININE 0.79 11/10/2016   BUN 15 11/10/2016   CO2 30 11/10/2016   TSH 1.21 02/24/2016   HGBA1C 5.9 11/10/2016    Mm Screening Breast Tomo Bilateral  Result Date: 04/17/2016 CLINICAL DATA:  Screening. EXAM: 2D DIGITAL SCREENING BILATERAL MAMMOGRAM WITH CAD AND ADJUNCT TOMO COMPARISON:  Previous exam(s). ACR Breast Density Category b: There are scattered areas of fibroglandular density. FINDINGS: There are no findings suspicious for malignancy. Images were processed with CAD. IMPRESSION: No mammographic evidence of malignancy. A result letter of this screening mammogram will be mailed directly to the patient. RECOMMENDATION: Screening mammogram in one year. (Code:SM-B-01Y) BI-RADS CATEGORY  1: Negative. Electronically Signed   By: Claudie Revering M.D.   On: 04/17/2016 13:30       Assessment & Plan:   Problem List Items Addressed This Visit    Depression    On zoloft and doing well.  Follow.        Hypercholesterolemia    On simvastatin.  Low cholesterol diet and exercise.  Follow lipid panel and liver function tests.        Relevant Orders   Lipid panel (Completed)   Hepatic function panel (Completed)   Hyperglycemia    Follow met b and a1c.       Relevant Orders   Hemoglobin A1c (Completed)   Basic metabolic panel (Completed)   Neck pain    Improved.  Doing better.  Follow.        Osteoporosis - Primary    Check vitamin D level.  Continue weight bearing exercise.  Plan for f/u bone density.  Last improved.        Relevant Orders   VITAMIN D 25 Hydroxy (Vit-D Deficiency, Fractures) (Completed)       Einar Pheasant, MD

## 2016-11-10 NOTE — Progress Notes (Signed)
Pre visit review using our clinic review tool, if applicable. No additional management support is needed unless otherwise documented below in the visit note. 

## 2016-11-11 ENCOUNTER — Encounter: Payer: Self-pay | Admitting: Internal Medicine

## 2016-11-12 ENCOUNTER — Encounter: Payer: Self-pay | Admitting: Internal Medicine

## 2016-11-12 NOTE — Assessment & Plan Note (Signed)
Follow met b and a1c.  

## 2016-11-12 NOTE — Assessment & Plan Note (Signed)
Improved.  Doing better.  Follow.   

## 2016-11-12 NOTE — Assessment & Plan Note (Signed)
Check vitamin D level.  Continue weight bearing exercise.  Plan for f/u bone density.  Last improved.

## 2016-11-12 NOTE — Assessment & Plan Note (Signed)
On simvastatin.  Low cholesterol diet and exercise.  Follow lipid panel and liver function tests.   

## 2016-11-12 NOTE — Assessment & Plan Note (Signed)
On zoloft and doing well.  Follow.   

## 2017-02-02 ENCOUNTER — Ambulatory Visit (INDEPENDENT_AMBULATORY_CARE_PROVIDER_SITE_OTHER): Payer: Medicare Other

## 2017-02-02 VITALS — BP 132/72 | HR 85 | Temp 97.7°F | Resp 12 | Ht 64.0 in | Wt 142.4 lb

## 2017-02-02 DIAGNOSIS — Z0000 Encounter for general adult medical examination without abnormal findings: Secondary | ICD-10-CM | POA: Diagnosis not present

## 2017-02-02 NOTE — Patient Instructions (Addendum)
  Ms. Angelica Robertson , Thank you for taking time to come for your Medicare Wellness Visit. I appreciate your ongoing commitment to your health goals. Please review the following plan we discussed and let me know if I can assist you in the future.  Follow up with Dr. Lorin PicketScott as needed.  Bring Advanced Directives (Health Care Power of Attorney/Living Will) to be scanned into chart.  These are the goals we discussed: Goals    . Healthy Lifestyle          Maintain exercise regimen Stay hydrated! Drink plenty of water Choose low carb foods, fruits and vegetables       This is a list of the screening recommended for you and due dates:  Health Maintenance  Topic Date Due  . Mammogram  04/17/2017  . Colon Cancer Screening  08/20/2022  . Tetanus Vaccine  02/28/2026  . Flu Shot  Completed  . DEXA scan (bone density measurement)  Completed  .  Hepatitis C: One time screening is recommended by Center for Disease Control  (CDC) for  adults born from [birth year] through 1965.   Completed  . Pneumonia vaccines  Completed

## 2017-02-02 NOTE — Progress Notes (Signed)
Subjective:   Angelica Robertson is a 71 y.o. female who presents for Medicare Annual (Subsequent) preventive examination.  Review of Systems:  No ROS.  Medicare Wellness Visit. Cardiac Risk Factors include: advanced age (>81mn, >>50women)     Objective:     Vitals: BP 132/72 (BP Location: Left Arm, Patient Position: Sitting, Cuff Size: Normal)   Pulse 85   Temp 97.7 F (36.5 C) (Oral)   Resp 12   Ht _0  (1.626 m)   Wt 142 lb 6.4 oz (64.6 kg)   LMP 11/27/1979   BMI 24.44 kg/m   Body mass index is 24.44 kg/m.   Tobacco History  Smoking Status  . Never Smoker  Smokeless Tobacco  . Never Used     Counseling given: Not Answered   Past Medical History:  Diagnosis Date  . Depression 2000  . Heart murmur   . Hypercholesterolemia Osteoporosis   Past Surgical History:  Procedure Laterality Date  . ABDOMINAL HYSTERECTOMY  1980   Family History  Problem Relation Age of Onset  . Heart disease Mother   . Diabetes Mother   . Heart disease Father   . Diabetes Father   . Colon cancer    . BRCA 1/2 Neg Hx   . Breast cancer Neg Hx   . Cancer Neg Hx   . Cowden syndrome Neg Hx   . DES usage Neg Hx   . Endometrial cancer Neg Hx   . Li-Fraumeni syndrome Neg Hx   . Ovarian cancer Neg Hx    History  Sexual Activity  . Sexual activity: Yes    Outpatient Encounter Prescriptions as of 02/02/2017  Medication Sig  . calcium carbonate (OS-CAL) 600 MG TABS tablet Take 600 mg by mouth 2 (two) times daily with a meal.  . Calcium-Magnesium-Zinc 333-133-5 MG TABS Take by mouth.  . Cholecalciferol (VITAMIN D PO) Take 250 mg by mouth 2 (two) times daily.  . cyclobenzaprine (FLEXERIL) 5 MG tablet TAKE 1 TABLET BY MOUTH AT BEDTIME AS NEEDED FOR MUSCLE SPASMS.  . Misc Natural Products (GLUCOSAMINE CHONDROITIN VIT D3 PO) Take 1 capsule by mouth.  . sertraline (ZOLOFT) 50 MG tablet Take 1 tablet (50 mg total) by mouth daily.  . simvastatin (ZOCOR) 20 MG tablet Take 1 tablet (20 mg  total) by mouth daily.   No facility-administered encounter medications on file as of 02/02/2017.     Activities of Daily Living In your present state of health, do you have any difficulty performing the following activities: 02/02/2017 02/03/2016  Hearing? N N  Vision? N N  Difficulty concentrating or making decisions? N N  Walking or climbing stairs? N N  Dressing or bathing? N N  Doing errands, shopping? N N  Preparing Food and eating ? N N  Using the Toilet? N N  In the past six months, have you accidently leaked urine? N Y  Do you have problems with loss of bowel control? N N  Managing your Medications? N N  Managing your Finances? N N  Housekeeping or managing your Housekeeping? N N  Some recent data might be hidden    Patient Care Team: CEinar Pheasant MD as PCP - General (Internal Medicine)    Assessment:    This is a routine wellness examination for [name] The goal of the wellness visit is to assist the patient how to close the gaps in care and create a preventative care plan for the patient.   Taking calcium VIT  D as appropriate/Osteoporosis reviewed.  Medications reviewed; taking without issues or barriers.  Safety issues reviewed; smoke detectors in the home. No firearms in the home. Wears seatbelts when driving or riding with others. No violence in the home.  No identified risk were noted; The patient was oriented x 3; appropriate in dress and manner and no objective failures at ADL's or IADL's.   BMI; discussed the importance of a healthy diet, water intake and exercise. Educational material provided.  Health maintenance gaps; closed.  Patient Concerns: None at this time. Follow up with PCP as needed.  Exercise Activities and Dietary recommendations Current Exercise Habits: Structured exercise class, Type of exercise: calisthenics;treadmill;strength training/weights, Time (Minutes): 60, Frequency (Times/Week): 5, Weekly Exercise (Minutes/Week): 300,  Intensity: Moderate  Goals    . Healthy Lifestyle          Maintain exercise regimen Stay hydrated! Drink plenty of water Choose low carb foods, fruits and vegetables      Fall Risk Fall Risk  02/02/2017 11/10/2016 02/24/2016 02/03/2016 02/22/2015  Falls in the past year? _0    Depression Screen PHQ 2/9 Scores 02/02/2017 11/10/2016 02/24/2016 02/03/2016  PHQ - 2 Score 0 0 0 0     Cognitive Function MMSE - Mini Mental State Exam 02/02/2017 02/03/2016  Orientation to time 5 5  Orientation to Place 5 5  Registration 3 3  Attention/ Calculation 5 5  Recall 3 3  Language- name 2 objects 2 2  Language- repeat 1 1  Language- follow 3 step command 3 3  Language- read & follow direction 1 1  Write a sentence 1 1  Copy design 1 1  Total score 30 30        Immunization History  Administered Date(s) Administered  . Influenza Nasal 09/09/2012  . Influenza Split 01/07/2013, 09/15/2013  . Influenza,inj,Quad PF,36+ Mos 08/13/2014, 08/26/2015  . Influenza-Unspecified 10/20/2016  . Pneumococcal Conjugate-13 11/25/2013  . Pneumococcal Polysaccharide-23 02/24/2016  . Tdap 02/29/2016  . Zoster 02/29/2016   Screening Tests Health Maintenance  Topic Date Due  . MAMMOGRAM  04/17/2017  . COLONOSCOPY  08/20/2022  . TETANUS/TDAP  02/28/2026  . INFLUENZA VACCINE  Completed  . DEXA SCAN  Completed  . Hepatitis C Screening  Completed  . PNA vac Low Risk Adult  Completed      Plan:    End of life planning; Advance aging; Advanced directives discussed. Copy of current HCPOA/Living Will requested.  Medicare Attestation I have personally reviewed: The patient's medical and social history Their use of alcohol, tobacco or illicit drugs Their current medications and supplements The patient's functional ability including ADLs,fall risks, home safety risks, cognitive, and hearing and visual impairment Diet and physical activities Evidence for depression   The patient's weight,  height, BMI, and visual acuity have been recorded in the chart.  I have made referrals and provided education to the patient based on review of the above and I have provided the patient with a written personalized care plan for preventive services.    During the course of the visit the patient was educated and counseled about the following appropriate screening and preventive services:   Vaccines to include Pneumoccal, Influenza, Hepatitis B, Td, Zostavax, HCV  Cardiovascular Disease  Colorectal cancer screening  Bone density screening  Mammography/PAP  Nutrition counseling   Patient Instructions (the written plan) was given to the patient.   Varney Biles, LPN  1/57/2620

## 2017-02-04 ENCOUNTER — Encounter: Payer: Self-pay | Admitting: Internal Medicine

## 2017-02-04 NOTE — Progress Notes (Signed)
  I have reviewed the above information and agree with above.   Denise Washburn, MD 

## 2017-02-04 NOTE — Progress Notes (Signed)
  I have reviewed the above information and agree with above.   Caliope Ruppert, MD 

## 2017-03-06 ENCOUNTER — Other Ambulatory Visit: Payer: Self-pay | Admitting: Internal Medicine

## 2017-03-06 DIAGNOSIS — Z1231 Encounter for screening mammogram for malignant neoplasm of breast: Secondary | ICD-10-CM

## 2017-03-16 ENCOUNTER — Encounter: Payer: Self-pay | Admitting: Internal Medicine

## 2017-03-16 ENCOUNTER — Ambulatory Visit (INDEPENDENT_AMBULATORY_CARE_PROVIDER_SITE_OTHER): Payer: Medicare Other | Admitting: Internal Medicine

## 2017-03-16 VITALS — BP 118/64 | HR 87 | Temp 98.7°F | Resp 12 | Ht 64.17 in | Wt 140.2 lb

## 2017-03-16 DIAGNOSIS — F32A Depression, unspecified: Secondary | ICD-10-CM

## 2017-03-16 DIAGNOSIS — Z0000 Encounter for general adult medical examination without abnormal findings: Secondary | ICD-10-CM

## 2017-03-16 DIAGNOSIS — M810 Age-related osteoporosis without current pathological fracture: Secondary | ICD-10-CM

## 2017-03-16 DIAGNOSIS — F329 Major depressive disorder, single episode, unspecified: Secondary | ICD-10-CM | POA: Diagnosis not present

## 2017-03-16 DIAGNOSIS — M542 Cervicalgia: Secondary | ICD-10-CM | POA: Diagnosis not present

## 2017-03-16 DIAGNOSIS — E2839 Other primary ovarian failure: Secondary | ICD-10-CM

## 2017-03-16 DIAGNOSIS — R739 Hyperglycemia, unspecified: Secondary | ICD-10-CM | POA: Diagnosis not present

## 2017-03-16 DIAGNOSIS — E7800 Pure hypercholesterolemia, unspecified: Secondary | ICD-10-CM

## 2017-03-16 LAB — BASIC METABOLIC PANEL
BUN: 12 mg/dL (ref 6–23)
CHLORIDE: 104 meq/L (ref 96–112)
CO2: 31 mEq/L (ref 19–32)
Calcium: 9.6 mg/dL (ref 8.4–10.5)
Creatinine, Ser: 0.72 mg/dL (ref 0.40–1.20)
GFR: 85.46 mL/min (ref >60.00)
Glucose, Bld: 101 mg/dL — ABNORMAL HIGH (ref 70–99)
Potassium: 4.2 mEq/L (ref 3.5–5.1)
Sodium: 139 mEq/L (ref 135–145)

## 2017-03-16 LAB — LIPID PANEL
CHOL/HDL RATIO: 3
Cholesterol: 165 mg/dL (ref 0–200)
HDL: 53 mg/dL (ref >39.00)
LDL CALC: 90 mg/dL (ref 0–99)
NonHDL: 112.24
TRIGLYCERIDES: 113 mg/dL (ref 0.0–149.0)
VLDL: 22.6 mg/dL (ref 0.0–40.0)

## 2017-03-16 LAB — CBC WITH DIFFERENTIAL/PLATELET
BASOS ABS: 0.1 10*3/uL (ref 0.0–0.1)
Basophils Relative: 1.3 % (ref 0.0–3.0)
EOS PCT: 5.6 % — AB (ref 0.0–5.0)
Eosinophils Absolute: 0.4 10*3/uL (ref 0.0–0.7)
HEMATOCRIT: 38 % (ref 36.0–46.0)
Hemoglobin: 12.7 g/dL (ref 12.0–15.0)
LYMPHS PCT: 30.2 % (ref 12.0–46.0)
Lymphs Abs: 1.9 10*3/uL (ref 0.7–4.0)
MCHC: 33.4 g/dL (ref 30.0–36.0)
MCV: 89.1 fl (ref 78.0–100.0)
MONOS PCT: 8.5 % (ref 3.0–12.0)
Monocytes Absolute: 0.5 10*3/uL (ref 0.1–1.0)
NEUTROS ABS: 3.5 10*3/uL (ref 1.4–7.7)
Neutrophils Relative %: 54.4 % (ref 43.0–77.0)
PLATELETS: 309 10*3/uL (ref 150.0–400.0)
RBC: 4.26 Mil/uL (ref 3.87–5.11)
RDW: 13.3 % (ref 11.5–15.5)
WBC: 6.4 10*3/uL (ref 4.0–10.5)

## 2017-03-16 LAB — HEPATIC FUNCTION PANEL
ALBUMIN: 4.2 g/dL (ref 3.5–5.2)
ALT: 19 U/L (ref 0–35)
AST: 31 U/L (ref 0–37)
Alkaline Phosphatase: 49 U/L (ref 39–117)
Bilirubin, Direct: 0.1 mg/dL (ref 0.0–0.3)
TOTAL PROTEIN: 6.2 g/dL (ref 6.0–8.3)
Total Bilirubin: 0.5 mg/dL (ref 0.2–1.2)

## 2017-03-16 LAB — TSH: TSH: 1.3 u[IU]/mL (ref 0.35–4.50)

## 2017-03-16 LAB — HEMOGLOBIN A1C: HEMOGLOBIN A1C: 6 % (ref 4.6–6.5)

## 2017-03-16 NOTE — Assessment & Plan Note (Signed)
Physical today 03/16/17.  Mammogram 04/17/16 - Birads I.  Scheduled for f/u mammogram 04/18/17.  Colonoscopy 08/20/12.

## 2017-03-16 NOTE — Progress Notes (Signed)
Pre-visit discussion using our clinic review tool. No additional management support is needed unless otherwise documented below in the visit note.  

## 2017-03-16 NOTE — Assessment & Plan Note (Signed)
Dong well.  Improved.  Follow.

## 2017-03-16 NOTE — Assessment & Plan Note (Signed)
Low cholesterol diet and exercise.  On simvastatin.  Follow lipid panel and liver function tests.   

## 2017-03-16 NOTE — Assessment & Plan Note (Signed)
Check met b and a1c   

## 2017-03-16 NOTE — Assessment & Plan Note (Signed)
Doing well on zoloft.  Follow.   

## 2017-03-16 NOTE — Assessment & Plan Note (Signed)
Schedule bone density.  Received reclast.  Vitamin D level in 11/2016 wnl.

## 2017-03-16 NOTE — Progress Notes (Signed)
Patient ID: Angelica Robertson, female   DOB: [birth year], 71 y.o.   MRN: [id number]   Subjective:    Patient ID: Angelica Robertson, female    DOB: [birth year], 71 y.o.   MRN: [id number]  HPI  Patient here for her physical exam.  She is doing better.  Feels good.  Handling stress.  Had some increased issues around 17-Dec-2023 (with previous death of her friend), but overall is doing well.  Exercises 5 days per week.  No chest pain.  No sob.  No acid reflux.  No abdominal pain.  Bowels moving.  Neck is better.    Past Medical History:  Diagnosis Date  . Depression 2000  . Heart murmur   . Hypercholesterolemia Osteoporosis   Past Surgical History:  Procedure Laterality Date  . ABDOMINAL HYSTERECTOMY  1980   Family History  Problem Relation Age of Onset  . Heart disease Mother   . Diabetes Mother   . Heart disease Father   . Diabetes Father   . Colon cancer    . BRCA 1/2 Neg Hx   . Breast cancer Neg Hx   . Cancer Neg Hx   . Cowden syndrome Neg Hx   . DES usage Neg Hx   . Endometrial cancer Neg Hx   . Li-Fraumeni syndrome Neg Hx   . Ovarian cancer Neg Hx    Social History   Social History  . Marital status: Married    Spouse name: N/A  . Number of children: N/A  . Years of education: N/A   Social History Main Topics  . Smoking status: Never Smoker  . Smokeless tobacco: Never Used  . Alcohol use 0.0 oz/week     Comment: occasionally - socially  . Drug use: No  . Sexual activity: Yes   Other Topics Concern  . None   Social History Narrative  . None    Outpatient Encounter Prescriptions as of 03/16/2017  Medication Sig  . calcium carbonate (OS-CAL) 600 MG TABS tablet Take 600 mg by mouth 2 (two) times daily with a meal.  . Calcium-Magnesium-Zinc 333-133-5 MG TABS Take by mouth.  . Cholecalciferol (VITAMIN D PO) Take 250 mg by mouth 2 (two) times daily.  . Misc Natural Products (GLUCOSAMINE CHONDROITIN VIT D3 PO) Take 1 capsule by mouth.  . sertraline (ZOLOFT) 50 MG tablet Take 1  tablet (50 mg total) by mouth daily.  . simvastatin (ZOCOR) 20 MG tablet Take 1 tablet (20 mg total) by mouth daily.  Marland Kitchen tiZANidine (ZANAFLEX) 2 MG tablet Take 1 tablet by mouth daily.  . Turmeric 500 MG CAPS Take 1 tablet by mouth.  . [DISCONTINUED] cyclobenzaprine (FLEXERIL) 5 MG tablet TAKE 1 TABLET BY MOUTH AT BEDTIME AS NEEDED FOR MUSCLE SPASMS. (Patient not taking: Reported on 03/16/2017)   No facility-administered encounter medications on file as of 03/16/2017.     Review of Systems  Constitutional: Negative for appetite change and unexpected weight change.  HENT: Negative for congestion and sinus pressure.   Eyes: Negative for pain and visual disturbance.  Respiratory: Negative for cough, chest tightness and shortness of breath.   Cardiovascular: Negative for chest pain, palpitations and leg swelling.  Gastrointestinal: Negative for abdominal pain, diarrhea, nausea and vomiting.  Genitourinary: Negative for difficulty urinating and dysuria.  Musculoskeletal: Negative for back pain and joint swelling.  Skin: Negative for color change and rash.  Neurological: Negative for dizziness, light-headedness and headaches.  Hematological: Negative for adenopathy. Does not bruise/bleed easily.  Psychiatric/Behavioral:  Negative for agitation and dysphoric mood.       Objective:     Blood pressure rechecked by me:  126/78  Physical Exam  Constitutional: She is oriented to person, place, and time. She appears well-developed and well-nourished. No distress.  HENT:  Nose: Nose normal.  Mouth/Throat: Oropharynx is clear and moist.  Eyes: Right eye exhibits no discharge. Left eye exhibits no discharge. No scleral icterus.  Neck: Neck supple. No thyromegaly present.  Cardiovascular: Normal rate and regular rhythm.   Pulmonary/Chest: Breath sounds normal. No accessory muscle usage. No tachypnea. No respiratory distress. She has no decreased breath sounds. She has no wheezes. She has no rhonchi.  Right breast exhibits no inverted nipple, no mass, no nipple discharge and no tenderness (no axillary adenopathy). Left breast exhibits no inverted nipple, no mass, no nipple discharge and no tenderness (no axilarry adenopathy).  Abdominal: Soft. Bowel sounds are normal. There is no tenderness.  Musculoskeletal: She exhibits no edema or tenderness.  Lymphadenopathy:    She has no cervical adenopathy.  Neurological: She is alert and oriented to person, place, and time.  Skin: Skin is warm. No rash noted. No erythema.  Psychiatric: She has a normal mood and affect. Her behavior is normal.    BP 118/64 (BP Location: Left Arm, Patient Position: Sitting, Cuff Size: Normal)   Pulse 87   Temp 98.7 F (37.1 C) (Oral)   Resp 12   Ht 5' 4.17" (1.63 m)   Wt 140 lb 3.2 oz (63.6 kg)   LMP 11/27/1979   SpO2 98%   BMI 23.94 kg/m  Wt Readings from Last 3 Encounters:  03/16/17 140 lb 3.2 oz (63.6 kg)  02/02/17 142 lb 6.4 oz (64.6 kg)  11/10/16 139 lb (63 kg)     Lab Results  Component Value Date   WBC 6.9 08/26/2015   HGB 13.8 08/26/2015   HCT 41.1 08/26/2015   PLT 315.0 08/26/2015   GLUCOSE 84 11/10/2016   CHOL 198 11/10/2016   TRIG 115.0 11/10/2016   HDL 61.60 11/10/2016   LDLCALC 113 (H) 11/10/2016   ALT 19 11/10/2016   AST 20 11/10/2016   NA 139 11/10/2016   K 4.2 11/10/2016   CL 100 11/10/2016   CREATININE 0.79 11/10/2016   BUN 15 11/10/2016   CO2 30 11/10/2016   TSH 1.21 02/24/2016   HGBA1C 5.9 11/10/2016    Mm Screening Breast Tomo Bilateral  Result Date: 04/17/2016 CLINICAL DATA:  Screening. EXAM: 2D DIGITAL SCREENING BILATERAL MAMMOGRAM WITH CAD AND ADJUNCT TOMO COMPARISON:  Previous exam(s). ACR Breast Density Category b: There are scattered areas of fibroglandular density. FINDINGS: There are no findings suspicious for malignancy. Images were processed with CAD. IMPRESSION: No mammographic evidence of malignancy. A result letter of this screening mammogram will be mailed  directly to the patient. RECOMMENDATION: Screening mammogram in one year. (Code:SM-B-01Y) BI-RADS CATEGORY  1: Negative. Electronically Signed   By: Claudie Revering M.D.   On: 04/17/2016 13:30       Assessment & Plan:   Problem List Items Addressed This Visit    Depression    Doing well on zoloft.  Follow.        Health care maintenance    Physical today 03/16/17.  Mammogram 04/17/16 - Birads I.  Scheduled for f/u mammogram 04/18/17.  Colonoscopy 08/20/12.        Hypercholesterolemia - Primary    Low cholesterol diet and exercise.  On simvastatin.  Follow lipid panel and liver  function tests.        Relevant Orders   CBC with Differential/Platelet   TSH   Lipid panel   Hepatic function panel   Hyperglycemia    Check met b and a1c.        Relevant Orders   Basic metabolic panel   Hemoglobin A1c   Neck pain    Dong well.  Improved.  Follow.        Osteoporosis    Schedule bone density.  Received reclast.  Vitamin D level in 11/2016 wnl.        Relevant Orders   DG Bone Density    Other Visit Diagnoses    Estrogen deficiency       Relevant Orders   DG Bone Density       Einar Pheasant, MD

## 2017-03-17 ENCOUNTER — Encounter: Payer: Self-pay | Admitting: Internal Medicine

## 2017-04-15 ENCOUNTER — Encounter: Payer: Self-pay | Admitting: Internal Medicine

## 2017-04-16 MED ORDER — SERTRALINE HCL 50 MG PO TABS: 50.0000 mg | ORAL_TABLET | Freq: Every day | ORAL | 1 refills | Status: DC

## 2017-04-16 MED ORDER — SIMVASTATIN 20 MG PO TABS: 20.0000 mg | ORAL_TABLET | Freq: Every day | ORAL | 1 refills | Status: DC

## 2017-04-18 ENCOUNTER — Ambulatory Visit
Admission: RE | Admit: 2017-04-18 | Discharge: 2017-04-18 | Disposition: A | Discharge status: Home / Self Care | Payer: Medicare Other | Source: Ambulatory Visit | Attending: Internal Medicine | Admitting: Internal Medicine

## 2017-04-18 DIAGNOSIS — Z1231 Encounter for screening mammogram for malignant neoplasm of breast: Secondary | ICD-10-CM

## 2017-09-07 ENCOUNTER — Encounter: Payer: Self-pay | Admitting: Internal Medicine

## 2017-09-25 ENCOUNTER — Ambulatory Visit (INDEPENDENT_AMBULATORY_CARE_PROVIDER_SITE_OTHER): Payer: Medicare Other | Admitting: Internal Medicine

## 2017-09-25 ENCOUNTER — Encounter: Payer: Self-pay | Admitting: Internal Medicine

## 2017-09-25 ENCOUNTER — Ambulatory Visit (INDEPENDENT_AMBULATORY_CARE_PROVIDER_SITE_OTHER): Payer: Medicare Other

## 2017-09-25 ENCOUNTER — Ambulatory Visit: Payer: Self-pay | Admitting: Internal Medicine

## 2017-09-25 VITALS — BP 120/78 | HR 87 | Temp 98.3°F | Resp 20 | Ht 64.17 in | Wt 137.4 lb

## 2017-09-25 DIAGNOSIS — R739 Hyperglycemia, unspecified: Secondary | ICD-10-CM | POA: Diagnosis not present

## 2017-09-25 DIAGNOSIS — F329 Major depressive disorder, single episode, unspecified: Secondary | ICD-10-CM | POA: Diagnosis not present

## 2017-09-25 DIAGNOSIS — M542 Cervicalgia: Secondary | ICD-10-CM

## 2017-09-25 DIAGNOSIS — F32A Depression, unspecified: Secondary | ICD-10-CM

## 2017-09-25 DIAGNOSIS — M810 Age-related osteoporosis without current pathological fracture: Secondary | ICD-10-CM | POA: Diagnosis not present

## 2017-09-25 DIAGNOSIS — E7800 Pure hypercholesterolemia, unspecified: Secondary | ICD-10-CM | POA: Diagnosis not present

## 2017-09-25 DIAGNOSIS — Z23 Encounter for immunization: Secondary | ICD-10-CM

## 2017-09-25 LAB — BASIC METABOLIC PANEL
BUN: 9 mg/dL (ref 6–23)
CO2: 27 meq/L (ref 19–32)
Calcium: 9.8 mg/dL (ref 8.4–10.5)
Chloride: 99 mEq/L (ref 96–112)
Creatinine, Ser: 0.68 mg/dL (ref 0.40–1.20)
GFR: 91.15 mL/min (ref >60.00)
GLUCOSE: 108 mg/dL — AB (ref 70–99)
POTASSIUM: 4 meq/L (ref 3.5–5.1)
SODIUM: 135 meq/L (ref 135–145)

## 2017-09-25 LAB — HEPATIC FUNCTION PANEL
ALT: 17 U/L (ref 0–35)
AST: 20 U/L (ref 0–37)
Albumin: 4.5 g/dL (ref 3.5–5.2)
Alkaline Phosphatase: 52 U/L (ref 39–117)
BILIRUBIN DIRECT: 0.1 mg/dL (ref 0.0–0.3)
BILIRUBIN TOTAL: 0.6 mg/dL (ref 0.2–1.2)
Total Protein: 7 g/dL (ref 6.0–8.3)

## 2017-09-25 LAB — LIPID PANEL
CHOL/HDL RATIO: 3
CHOLESTEROL: 171 mg/dL (ref 0–200)
HDL: 57.3 mg/dL (ref >39.00)
LDL CALC: 88 mg/dL (ref 0–99)
NonHDL: 113.27
TRIGLYCERIDES: 126 mg/dL (ref 0.0–149.0)
VLDL: 25.2 mg/dL (ref 0.0–40.0)

## 2017-09-25 LAB — HEMOGLOBIN A1C: HEMOGLOBIN A1C: 5.9 % (ref 4.6–6.5)

## 2017-09-25 NOTE — Progress Notes (Signed)
Patient ID: Angelica Robertson, female   DOB: 1945/12/19, 71 y.o.   MRN: [id number]   Subjective:    Patient ID: Angelica Robertson, female    DOB: Jul 17, 1946, 71 y.o.   MRN: [id number]  HPI  Patient here for a scheduled follow up.  Increased stress.  Her husband went to the ER last night.  Found to have bilateral pulmonary emboli.  She feels she is handling stress ok.  Does not feel needs any further intervention at this time.  Stays active.  Exercising.  No chest pain.  No sob.  No acid reflux.  No abdominal pain. Bowels moving.  Increased neck pain.  Flared again recently.  Going to therapy.  alleve helps, but makes her constipated.  Using sparingly now.  Bowels doing ok.     Past Medical History:  Diagnosis Date  . Depression 2000  . Heart murmur   . Hypercholesterolemia Osteoporosis   Past Surgical History:  Procedure Laterality Date  . ABDOMINAL HYSTERECTOMY  1980   Family History  Problem Relation Age of Onset  . Heart disease Mother   . Diabetes Mother   . Heart disease Father   . Diabetes Father   . Colon cancer Unknown   . BRCA 1/2 Neg Hx   . Breast cancer Neg Hx   . Cancer Neg Hx   . Cowden syndrome Neg Hx   . DES usage Neg Hx   . Endometrial cancer Neg Hx   . Li-Fraumeni syndrome Neg Hx   . Ovarian cancer Neg Hx    Social History   Social History  . Marital status: Married    Spouse name: N/A  . Number of children: N/A  . Years of education: N/A   Social History Main Topics  . Smoking status: Never Smoker  . Smokeless tobacco: Never Used  . Alcohol use 0.0 oz/week     Comment: occasionally - socially  . Drug use: No  . Sexual activity: Yes   Other Topics Concern  . None   Social History Narrative  . None    Outpatient Encounter Prescriptions as of 09/25/2017  Medication Sig  . calcium carbonate (OS-CAL) 600 MG TABS tablet Take 600 mg by mouth 2 (two) times daily with a meal.  . Calcium-Magnesium-Zinc 333-133-5 MG TABS Take by mouth.  . Cholecalciferol  (VITAMIN D PO) Take 250 mg by mouth 2 (two) times daily.  . Misc Natural Products (GLUCOSAMINE CHONDROITIN VIT D3 PO) Take 1 capsule by mouth.  . sertraline (ZOLOFT) 50 MG tablet Take 1 tablet (50 mg total) by mouth daily.  . simvastatin (ZOCOR) 20 MG tablet Take 1 tablet (20 mg total) by mouth daily.  Marland Kitchen tiZANidine (ZANAFLEX) 2 MG tablet Take 1 tablet by mouth daily.  . [DISCONTINUED] Turmeric 500 MG CAPS Take 1 tablet by mouth.   No facility-administered encounter medications on file as of 09/25/2017.     Review of Systems  Constitutional: Negative for appetite change.       Has adjusted her diet. Lost weight.    HENT: Negative for congestion and sinus pressure.   Respiratory: Negative for cough, chest tightness and shortness of breath.   Cardiovascular: Negative for chest pain, palpitations and leg swelling.  Gastrointestinal: Negative for abdominal pain, nausea and vomiting.       No constipation now.   Genitourinary: Negative for difficulty urinating and dysuria.  Musculoskeletal: Positive for neck pain. Negative for myalgias.       No radicular symptoms.  Specifically denies any arm pain or numbness or tingling.    Skin: Negative for color change and rash.  Neurological: Negative for dizziness, light-headedness and headaches.  Psychiatric/Behavioral: Negative for agitation and dysphoric mood.       Objective:    Physical Exam  Constitutional: She appears well-developed and well-nourished. No distress.  HENT:  Nose: Nose normal.  Mouth/Throat: Oropharynx is clear and moist.  Neck: Neck supple. No thyromegaly present.  Cardiovascular: Normal rate and regular rhythm.   Pulmonary/Chest: Breath sounds normal. No respiratory distress. She has no wheezes.  Abdominal: Soft. Bowel sounds are normal. There is no tenderness.  Musculoskeletal: She exhibits no edema or tenderness.  Lymphadenopathy:    She has no cervical adenopathy.  Skin: No rash noted. No erythema.  Psychiatric:  She has a normal mood and affect. Her behavior is normal.    BP 120/78 (BP Location: Left Arm, Patient Position: Sitting, Cuff Size: Normal)   Pulse 87   Temp 98.3 F (36.8 C) (Oral)   Resp 20   Ht 5' 4.17" (1.63 m)   Wt 137 lb 6.4 oz (62.3 kg)   LMP 11/27/1979   SpO2 95%   BMI 23.46 kg/m  Wt Readings from Last 3 Encounters:  09/25/17 137 lb 6.4 oz (62.3 kg)  03/16/17 140 lb 3.2 oz (63.6 kg)  02/02/17 142 lb 6.4 oz (64.6 kg)     Lab Results  Component Value Date   WBC 6.4 03/16/2017   HGB 12.7 03/16/2017   HCT 38.0 03/16/2017   PLT 309.0 03/16/2017   GLUCOSE 108 (H) 09/25/2017   CHOL 171 09/25/2017   TRIG 126.0 09/25/2017   HDL 57.30 09/25/2017   LDLCALC 88 09/25/2017   ALT 17 09/25/2017   AST 20 09/25/2017   NA 135 09/25/2017   K 4.0 09/25/2017   CL 99 09/25/2017   CREATININE 0.68 09/25/2017   BUN 9 09/25/2017   CO2 27 09/25/2017   TSH 1.30 03/16/2017   HGBA1C 5.9 09/25/2017    Mm Screening Breast Tomo Bilateral  Result Date: 04/18/2017 CLINICAL DATA:  Screening. EXAM: 2D DIGITAL SCREENING BILATERAL MAMMOGRAM WITH CAD AND ADJUNCT TOMO COMPARISON:  Previous exam(s). ACR Breast Density Category b: There are scattered areas of fibroglandular density. FINDINGS: There are no findings suspicious for malignancy. Images were processed with CAD. IMPRESSION: No mammographic evidence of malignancy. A result letter of this screening mammogram will be mailed directly to the patient. RECOMMENDATION: Screening mammogram in one year. (Code:SM-B-01Y) BI-RADS CATEGORY  1: Negative. Electronically Signed   By: Pamelia Hoit M.D.   On: 04/18/2017 13:30       Assessment & Plan:   Problem List Items Addressed This Visit    Depression    Doing well on zoloft.  Stable.  Increased stress with her husband's health issues as outlined.  Does not feel needs anything more.  Follow.        Hypercholesterolemia    On simvastatin.  Low cholesterol diet and exercise.  Follow lipid panel and  liver function tests.        Relevant Orders   Hepatic function panel (Completed)   Lipid panel (Completed)   Hyperglycemia    She has adjusted her diet.  Lost weight.  Check met b and a1c.        Relevant Orders   Hemoglobin A1c (Completed)   Basic metabolic panel (Completed)   Neck pain - Primary    With flare again.  Neck pain as outlined.  Going to therapy.  Will recheck c-spine xray.        Relevant Orders   DG Cervical Spine 2 or 3 views (Completed)   Osteoporosis    Received reclast.        Other Visit Diagnoses    Encounter for immunization       Relevant Orders   Flu vaccine HIGH DOSE PF (Completed)       Einar Pheasant, MD

## 2017-09-28 ENCOUNTER — Encounter: Payer: Self-pay | Admitting: Internal Medicine

## 2017-09-28 NOTE — Assessment & Plan Note (Signed)
Doing well on zoloft.  Stable.  Increased stress with her husband's health issues as outlined.  Does not feel needs anything more.  Follow.

## 2017-09-28 NOTE — Assessment & Plan Note (Signed)
Received reclast.   

## 2017-09-28 NOTE — Assessment & Plan Note (Signed)
With flare again.  Neck pain as outlined.  Going to therapy.  Will recheck c-spine xray.

## 2017-09-28 NOTE — Assessment & Plan Note (Signed)
She has adjusted her diet.  Lost weight.  Check met b and a1c.

## 2017-09-28 NOTE — Assessment & Plan Note (Signed)
On simvastatin.  Low cholesterol diet and exercise.  Follow lipid panel and liver function tests.   

## 2017-10-11 ENCOUNTER — Other Ambulatory Visit: Payer: Self-pay

## 2017-10-16 ENCOUNTER — Encounter: Payer: Self-pay | Admitting: Internal Medicine

## 2017-10-17 MED ORDER — SIMVASTATIN 20 MG PO TABS: 20.0000 mg | ORAL_TABLET | Freq: Every day | ORAL | 1 refills | Status: DC

## 2017-10-17 MED ORDER — SERTRALINE HCL 50 MG PO TABS: 50.0000 mg | ORAL_TABLET | Freq: Every day | ORAL | 1 refills | Status: DC

## 2017-10-31 ENCOUNTER — Telehealth: Payer: Self-pay

## 2017-10-31 NOTE — Telephone Encounter (Signed)
Copied from CRM [phone]. Topic: Appointment Scheduling - Scheduling Inquiry for Clinic >> Oct 31, 2017 11:41 AM Waymon AmatoBurton, Donna F wrote: Reason for CRM: pt is wanting to schedule an appt with Dr. Lorin PicketScott on or after 11/05/17 when she has her bone denisty and nothing else is available please see how pt can be worked into [employer] schedule

## 2017-11-05 ENCOUNTER — Encounter: Payer: Self-pay | Admitting: Internal Medicine

## 2017-11-05 ENCOUNTER — Ambulatory Visit
Admission: RE | Admit: 2017-11-05 | Discharge: 2017-11-05 | Disposition: A | Discharge status: Home / Self Care | Payer: Medicare Other | Source: Ambulatory Visit | Attending: Internal Medicine | Admitting: Internal Medicine

## 2017-11-05 DIAGNOSIS — M8588 Other specified disorders of bone density and structure, other site: Secondary | ICD-10-CM | POA: Insufficient documentation

## 2017-11-05 DIAGNOSIS — E2839 Other primary ovarian failure: Secondary | ICD-10-CM

## 2017-11-05 DIAGNOSIS — M810 Age-related osteoporosis without current pathological fracture: Secondary | ICD-10-CM | POA: Diagnosis present

## 2017-11-06 NOTE — Telephone Encounter (Signed)
Lm to call office

## 2017-12-14 ENCOUNTER — Encounter: Payer: Self-pay | Admitting: Internal Medicine

## 2017-12-16 NOTE — Telephone Encounter (Signed)
Reviewed pts my chart message.  She needs a work in appt with me.  Thanks    Dr Lorin PicketScott

## 2017-12-28 ENCOUNTER — Encounter: Payer: Self-pay | Admitting: Internal Medicine

## 2017-12-28 ENCOUNTER — Ambulatory Visit: Payer: Medicare Other | Admitting: Internal Medicine

## 2017-12-28 VITALS — BP 144/86 | HR 82 | Temp 98.2°F | Resp 16 | Wt 137.6 lb

## 2017-12-28 DIAGNOSIS — M542 Cervicalgia: Secondary | ICD-10-CM

## 2017-12-28 DIAGNOSIS — F32A Depression, unspecified: Secondary | ICD-10-CM

## 2017-12-28 DIAGNOSIS — R55 Syncope and collapse: Secondary | ICD-10-CM

## 2017-12-28 DIAGNOSIS — F329 Major depressive disorder, single episode, unspecified: Secondary | ICD-10-CM

## 2017-12-28 DIAGNOSIS — R030 Elevated blood-pressure reading, without diagnosis of hypertension: Secondary | ICD-10-CM

## 2017-12-28 LAB — CBC WITH DIFFERENTIAL/PLATELET
Basophils Absolute: 0.1 10*3/uL (ref 0.0–0.1)
Basophils Relative: 0.9 % (ref 0.0–3.0)
Eosinophils Absolute: 0.1 10*3/uL (ref 0.0–0.7)
Eosinophils Relative: 1.1 % (ref 0.0–5.0)
HCT: 41 % (ref 36.0–46.0)
Hemoglobin: 13.7 g/dL (ref 12.0–15.0)
LYMPHS ABS: 1.7 10*3/uL (ref 0.7–4.0)
LYMPHS PCT: 20.7 % (ref 12.0–46.0)
MCHC: 33.4 g/dL (ref 30.0–36.0)
MCV: 89.7 fl (ref 78.0–100.0)
MONOS PCT: 8 % (ref 3.0–12.0)
Monocytes Absolute: 0.7 10*3/uL (ref 0.1–1.0)
NEUTROS PCT: 69.3 % (ref 43.0–77.0)
Neutro Abs: 5.7 10*3/uL (ref 1.4–7.7)
Platelets: 373 10*3/uL (ref 150.0–400.0)
RBC: 4.57 Mil/uL (ref 3.87–5.11)
RDW: 13 % (ref 11.5–15.5)
WBC: 8.2 10*3/uL (ref 4.0–10.5)

## 2017-12-28 LAB — BASIC METABOLIC PANEL
BUN: 6 mg/dL (ref 6–23)
CO2: 30 meq/L (ref 19–32)
CREATININE: 0.67 mg/dL (ref 0.40–1.20)
Calcium: 9.8 mg/dL (ref 8.4–10.5)
Chloride: 96 mEq/L (ref 96–112)
GFR: 92.65 mL/min (ref >60.00)
Glucose, Bld: 104 mg/dL — ABNORMAL HIGH (ref 70–99)
Potassium: 3.8 mEq/L (ref 3.5–5.1)
Sodium: 134 mEq/L — ABNORMAL LOW (ref 135–145)

## 2017-12-28 NOTE — Progress Notes (Signed)
Patient ID: Angelica Robertson, female   DOB: Sep 24, 1946, 72 y.o.   MRN: [id number]   Subjective:    Patient ID: Angelica Robertson, female    DOB: 09-21-1946, 72 y.o.   MRN: [id number]  HPI  Patient here for a scheduled follow up.  She is accompanied by her husband.  History obtained from both of them.  States they traveled to [place] recently.  Was tired.  Returned home Friday evening.  Did not sleep well Saturday night.  Sunday am was tired.  Drank coffee and took her zoloft.  She had increased neck pain.  Took a zanaflex.  Usually does not take in the am.  Was sitting on the cough.  Felt anxious.  Had to urinate.  Got up and started walking and fell to the ground.  Her husband states he was initially calling to her and she was not answering.  Then she was mouthing something, but no words were coming out.  She got up after a while and walked into the kitchen.  Husband states was laid over the counter and then went down to the floor again.  He rolled her over.  When she sat up - was dizzy.  Called EMS.   Was instructed to remain lying on the floor. She stood up with her husband's help.  He held her and she started "going out again".  No chest pain.  No sob.  Some fatigue.  EMS evaluated.  States blood pressure ok.  She declined ER evaluation.  She has not had similar episode. Still going to the gym.  Exercising.  No problems exercising.  No chest pain.  No sob.  No acid reflux.  No abdominal pain.  Bowels moving.  Neck is still Angelica Robertson issue.  Increased pain.  Has been on muscle relaxers previously and had no problems, but usually takes at night.      Past Medical History:  Diagnosis Date  . Depression 2000  . Heart murmur   . Hypercholesterolemia Osteoporosis   Past Surgical History:  Procedure Laterality Date  . ABDOMINAL HYSTERECTOMY  1980   Family History  Problem Relation Age of Onset  . Heart disease Mother   . Diabetes Mother   . Heart disease Father   . Diabetes Father   . Colon cancer Unknown   .  BRCA 1/2 Neg Hx   . Breast cancer Neg Hx   . Cancer Neg Hx   . Cowden syndrome Neg Hx   . DES usage Neg Hx   . Endometrial cancer Neg Hx   . Li-Fraumeni syndrome Neg Hx   . Ovarian cancer Neg Hx    Social History   Socioeconomic History  . Marital status: Married    Spouse name: None  . Number of children: None  . Years of education: None  . Highest education level: None  Social Needs  . Financial resource strain: None  . Food insecurity - worry: None  . Food insecurity - inability: None  . Transportation needs - medical: None  . Transportation needs - non-medical: None  Occupational History  . None  Tobacco Use  . Smoking status: Never Smoker  . Smokeless tobacco: Never Used  Substance and Sexual Activity  . Alcohol use: Yes    Alcohol/week: 0.0 oz    Comment: occasionally - socially  . Drug use: No  . Sexual activity: Yes  Other Topics Concern  . None  Social History Narrative  . None    Outpatient  Encounter Medications as of 12/28/2017  Medication Sig  . calcium carbonate (OS-CAL) 600 MG TABS tablet Take 600 mg by mouth 2 (two) times daily with a meal.  . Calcium-Magnesium-Zinc 333-133-5 MG TABS Take by mouth.  . Cholecalciferol (VITAMIN D PO) Take 250 mg by mouth 2 (two) times daily.  . Misc Natural Products (GLUCOSAMINE CHONDROITIN VIT D3 PO) Take 1 capsule by mouth.  . sertraline (ZOLOFT) 50 MG tablet Take 1 tablet (50 mg total) daily by mouth.  . simvastatin (ZOCOR) 20 MG tablet Take 1 tablet (20 mg total) daily by mouth.  Marland Kitchen tiZANidine (ZANAFLEX) 2 MG tablet Take 1 tablet by mouth daily.   No facility-administered encounter medications on file as of 12/28/2017.     Review of Systems  Constitutional: Negative for appetite change and unexpected weight change.  HENT: Negative for congestion and sinus pressure.   Respiratory: Negative for cough, chest tightness and shortness of breath.   Cardiovascular: Negative for chest pain, palpitations and leg swelling.    Gastrointestinal: Negative for abdominal pain, diarrhea, nausea and vomiting.  Genitourinary: Negative for difficulty urinating and dysuria.  Musculoskeletal: Positive for neck pain. Negative for joint swelling.  Skin: Negative for color change and rash.  Neurological: Positive for syncope. Negative for dizziness, light-headedness and headaches.  Psychiatric/Behavioral: Negative for agitation and dysphoric mood.       Objective:    Physical Exam  Constitutional: She appears well-developed and well-nourished. No distress.  HENT:  Nose: Nose normal.  Mouth/Throat: Oropharynx is clear and moist.  Neck: Neck supple. No thyromegaly present.  Cardiovascular: Normal rate and regular rhythm.  Pulmonary/Chest: Breath sounds normal. No respiratory distress. She has no wheezes.  Abdominal: Soft. Bowel sounds are normal. There is no tenderness.  Musculoskeletal: She exhibits no edema or tenderness.  Lymphadenopathy:    She has no cervical adenopathy.  Skin: No rash noted. No erythema.  Psychiatric: She has a normal mood and affect. Her behavior is normal.    BP (!) 144/86 (BP Location: Left Arm, Patient Position: Sitting, Cuff Size: Normal)   Pulse 82   Temp 98.2 F (36.8 C) (Oral)   Resp 16   Wt 137 lb 9.6 oz (62.4 kg)   LMP 11/27/1979   SpO2 94%   BMI 23.49 kg/m  Wt Readings from Last 3 Encounters:  12/28/17 137 lb 9.6 oz (62.4 kg)  09/25/17 137 lb 6.4 oz (62.3 kg)  03/16/17 140 lb 3.2 oz (63.6 kg)     Lab Results  Component Value Date   WBC 8.2 12/28/2017   HGB 13.7 12/28/2017   HCT 41.0 12/28/2017   PLT 373.0 12/28/2017   GLUCOSE 104 (H) 12/28/2017   CHOL 171 09/25/2017   TRIG 126.0 09/25/2017   HDL 57.30 09/25/2017   LDLCALC 88 09/25/2017   ALT 17 09/25/2017   AST 20 09/25/2017   NA 134 (L) 12/28/2017   K 3.8 12/28/2017   CL 96 12/28/2017   CREATININE 0.67 12/28/2017   BUN 6 12/28/2017   CO2 30 12/28/2017   TSH 1.30 03/16/2017   HGBA1C 5.9 09/25/2017     Dg Bone Density  Result Date: 11/05/2017 EXAM: DUAL X-RAY ABSORPTIOMETRY (DXA) FOR BONE MINERAL DENSITY IMPRESSION: Dear Dr. Nicki Reaper, Your patient Angelica Robertson completed a BMD test on 11/05/2017 using the [place] (analysis version: 14.10) manufactured by [employer]. The following summarizes the results of our evaluation. PATIENT BIOGRAPHICAL: Name: Angelica Robertson, Angelica Robertson Patient ID:  [id number] Birth Date:  1946/07/12  Height:     64.2 in. Gender:      Female Exam Date:  11/05/2017            Weight:     137.8 lbs. Indications: Caucasian, Hysterectomy, Osteoporotic, Postmenopausal Fractures: Treatments: Calcium, Multi-Vitamin, Vitamin D ASSESSMENT: The BMD measured at AP Spine L1-L4 is 0.959 g/cm2 with a T-score of -1.9. This patient is considered OSTEOPENIC according to [facility]) criteria. Site Region Measured Measured WHO Young Adult BMD Date       Age      Classification T-score AP Spine L1-L4 11/05/2017 69.2 Osteopenia -1.9 0.959 g/cm2 DualFemur Neck Right 11/05/2017 69.2 Osteopenia -1.1 0.878 g/cm2 World Health Organization [facility]) criteria for post-menopausal, Caucasian Women: Normal:       T-score at or above -1 SD Osteopenia:   T-score between -1 and -2.5 SD Osteoporosis: T-score at or below -2.5 SD RECOMMENDATIONS: [place] recommends that FDA-approved medical therapies be considered in postmenopausal women and men age 74 or older with a: 1. Hip or vertebral (clinical or morphometric) fracture. 2. T-score of < -2.5 at the spine or hip. 3. Ten-year fracture probability by FRAX of 3% or greater for hip fracture or 20% or greater for major osteoporotic fracture. All treatment decisions require clinical judgment and consideration of individual patient factors, including patient preferences, co-morbidities, previous drug use, risk factors not captured in the FRAX model (e.g. falls, vitamin D deficiency, increased bone turnover, interval  significant decline in bone density) and possible under - or over-estimation of fracture risk by FRAX. All patients should ensure Angelica Robertson adequate intake of dietary calcium (1200 mg/d) and vitamin D (800 IU daily) unless contraindicated. FOLLOW-UP: People with diagnosed cases of osteoporosis or at high risk for fracture should have regular bone mineral density tests. For patients eligible for Medicare, routine testing is allowed once every 2 years. The testing frequency can be increased to one year for patients who have rapidly progressing disease, those who are receiving or discontinuing medical therapy to restore bone mass, or have additional risk factors. I have reviewed this report, and agree with the above findings. Mark A. Thornton Papas, M.D. [facility] Radiology Dear Dr. Nicki Reaper, Your patient Angelica Robertson completed a FRAX assessment on 11/05/2017 using the [place] (analysis version: 14.10) manufactured by [employer]. The following summarizes the results of our evaluation. PATIENT BIOGRAPHICAL: Name: Angelica Robertson, Angelica Robertson Patient ID: [id number] Birth Date: [birth year] Height:    64.2 in. Gender:     [age]    Age:        45.2       Weight:    137.8 lbs. Ethnicity:  White                            Exam Date: 11/05/2017 FRAX* RESULTS:  (version: 3.5) 10-year Probability of Fracture1 Major Osteoporotic Fracture2 Hip Fracture 8.7% 1.0% Population: [place] (Caucasian) Risk Factors: None Based on Femur (Right) Neck BMD 1 -The 10-year probability of fracture may be lower than reported if the patient has received treatment. 2 -Major Osteoporotic Fracture: Clinical Spine, Forearm, Hip or Shoulder *FRAX is a Materials engineer of the [employer] for Metabolic Bone Disease, a [place] (WHO) [facility]. ASSESSMENT: The probability of a major osteoporotic fracture is 8.7% within the next ten years. The probability of a hip fracture is 1.0% within the next ten years. I have  reviewed this report and  agree with the above findings. Mark A. Thornton Papas, M.D. [facility] Electronically Signed   By: Lavonia Dana M.D.   On: 11/05/2017 10:36       Assessment & Plan:   Problem List Items Addressed This Visit    Cervicalgia   Relevant Orders   MR Cervical Spine Wo Contrast   Depression    Stable on zoloft.  Has been on this medication for a while and tolerating.  Follow.       Elevated blood pressure reading    Elevated blood pressure.   Has been better. Have her spot check her pressure.  Get her back in soon to reassess.        Neck pain    Increased pain as outlined.  Has had xrays.  Has been to therapy.  Tried muscle relaxer.  Persistent increased pain.  Worsening.  Obtain MRI c-spine.        Relevant Orders   MR Cervical Spine Wo Contrast   Syncope - Primary    Had the episodes as outlined.  Unclear etiology.  Was tired.  Had not slept well.  Took zoloft and zanaflex.  Had not eaten.  Question of vasovagal response.  No chest pain.  Had three episodes.  Not evaluated at the time of the event.  Has not had recurrence.  She has stopped muscle relaxer and otc meds.  EKG - SR with no acute ischemic changes.  Discussed further w/up.  No focal neurological deficits.  Will have neurology evaluate with question of need for further neurological w/up.  Start aspirin daily.  W/up for her neck issues as outlined.        Relevant Orders   EKG 12-Lead (Completed)   CBC with Differential/Platelet (Completed)   Basic metabolic panel (Completed)   Ambulatory referral to Neurology   Ambulatory referral to Cardiology       Einar Pheasant, MD

## 2017-12-30 ENCOUNTER — Other Ambulatory Visit: Payer: Self-pay | Admitting: Internal Medicine

## 2017-12-30 ENCOUNTER — Encounter: Payer: Self-pay | Admitting: Internal Medicine

## 2017-12-30 DIAGNOSIS — E871 Hypo-osmolality and hyponatremia: Secondary | ICD-10-CM

## 2017-12-30 DIAGNOSIS — R55 Syncope and collapse: Secondary | ICD-10-CM | POA: Insufficient documentation

## 2017-12-30 NOTE — Progress Notes (Signed)
Order placed for f/u sodium.  ?

## 2017-12-30 NOTE — Assessment & Plan Note (Signed)
Stable on zoloft.  Has been on this medication for a while and tolerating.  Follow.

## 2017-12-30 NOTE — Assessment & Plan Note (Signed)
Increased pain as outlined.  Has had xrays.  Has been to therapy.  Tried muscle relaxer.  Persistent increased pain.  Worsening.  Obtain MRI c-spine.

## 2017-12-30 NOTE — Assessment & Plan Note (Signed)
Had the episodes as outlined.  Unclear etiology.  Was tired.  Had not slept well.  Took zoloft and zanaflex.  Had not eaten.  Question of vasovagal response.  No chest pain.  Had three episodes.  Not evaluated at the time of the event.  Has not had recurrence.  She has stopped muscle relaxer and otc meds.  EKG - SR with no acute ischemic changes.  Discussed further w/up.  No focal neurological deficits.  Will have neurology evaluate with question of need for further neurological w/up.  Start aspirin daily.  W/up for her neck issues as outlined.

## 2017-12-30 NOTE — Assessment & Plan Note (Signed)
Elevated blood pressure.   Has been better. Have her spot check her pressure.  Get her back in soon to reassess.

## 2017-12-31 ENCOUNTER — Telehealth: Payer: Self-pay

## 2017-12-31 NOTE — Telephone Encounter (Signed)
Copied from CRM [id number]. Topic: Referral - Status >> Dec 31, 2017 11:56 AM Amada Kingfisheruck, Melissa B wrote: Reason for CRM: lvm for pt to rtc. OK for [facility] to give appt information. She is scheduled to see Dr. Juliann Paresallwood at [facility] on Friday Jan. 25 at 10:00 am. Please call [phone] to reschedule if needed.

## 2018-01-01 DIAGNOSIS — K635 Polyp of colon: Secondary | ICD-10-CM | POA: Insufficient documentation

## 2018-01-04 ENCOUNTER — Encounter: Payer: Self-pay | Admitting: Internal Medicine

## 2018-01-04 NOTE — Progress Notes (Signed)
[facility] for appointment.

## 2018-01-07 ENCOUNTER — Encounter: Payer: Self-pay | Admitting: Internal Medicine

## 2018-01-07 ENCOUNTER — Ambulatory Visit
Admission: RE | Admit: 2018-01-07 | Discharge: 2018-01-07 | Disposition: A | Discharge status: Home / Self Care | Payer: Medicare Other | Source: Ambulatory Visit | Attending: Internal Medicine | Admitting: Internal Medicine

## 2018-01-07 DIAGNOSIS — M542 Cervicalgia: Secondary | ICD-10-CM | POA: Diagnosis present

## 2018-01-07 DIAGNOSIS — M47892 Other spondylosis, cervical region: Secondary | ICD-10-CM | POA: Diagnosis not present

## 2018-01-07 DIAGNOSIS — M50322 Other cervical disc degeneration at C5-C6 level: Secondary | ICD-10-CM | POA: Insufficient documentation

## 2018-01-07 NOTE — Telephone Encounter (Signed)
Patient scheduled for labs on 01/11/2018

## 2018-01-09 ENCOUNTER — Other Ambulatory Visit: Payer: Self-pay | Admitting: Internal Medicine

## 2018-01-09 DIAGNOSIS — R937 Abnormal findings on diagnostic imaging of other parts of musculoskeletal system: Secondary | ICD-10-CM

## 2018-01-09 DIAGNOSIS — M542 Cervicalgia: Secondary | ICD-10-CM

## 2018-01-09 NOTE — Progress Notes (Signed)
Order placed for neurosurgery referral 

## 2018-01-11 ENCOUNTER — Other Ambulatory Visit (INDEPENDENT_AMBULATORY_CARE_PROVIDER_SITE_OTHER): Payer: Medicare Other

## 2018-01-11 DIAGNOSIS — E871 Hypo-osmolality and hyponatremia: Secondary | ICD-10-CM

## 2018-01-11 LAB — SODIUM: Sodium: 136 mEq/L (ref 135–145)

## 2018-01-12 ENCOUNTER — Encounter: Payer: Self-pay | Admitting: Internal Medicine

## 2018-01-15 ENCOUNTER — Other Ambulatory Visit: Payer: Self-pay | Admitting: Nurse Practitioner

## 2018-01-15 DIAGNOSIS — R4020 Unspecified coma: Secondary | ICD-10-CM

## 2018-01-16 ENCOUNTER — Other Ambulatory Visit: Payer: Self-pay | Admitting: Internal Medicine

## 2018-01-18 ENCOUNTER — Ambulatory Visit
Admission: RE | Admit: 2018-01-18 | Discharge: 2018-01-18 | Disposition: A | Discharge status: Home / Self Care | Payer: Medicare Other | Source: Ambulatory Visit | Attending: Nurse Practitioner | Admitting: Nurse Practitioner

## 2018-01-18 DIAGNOSIS — R55 Syncope and collapse: Secondary | ICD-10-CM | POA: Insufficient documentation

## 2018-01-18 DIAGNOSIS — R4020 Unspecified coma: Secondary | ICD-10-CM

## 2018-01-18 MED ORDER — GADOBENATE DIMEGLUMINE 529 MG/ML IV SOLN
12.0000 mL | Freq: Once | INTRAVENOUS | Status: AC | PRN
  Administered 2018-01-18: 12 mL via INTRAVENOUS

## 2018-01-22 ENCOUNTER — Encounter: Payer: Self-pay | Admitting: Internal Medicine

## 2018-02-05 ENCOUNTER — Ambulatory Visit: Payer: Self-pay

## 2018-02-27 ENCOUNTER — Ambulatory Visit (INDEPENDENT_AMBULATORY_CARE_PROVIDER_SITE_OTHER): Payer: Medicare Other

## 2018-02-27 VITALS — BP 120/72 | HR 88 | Temp 98.4°F | Resp 14 | Ht 64.0 in | Wt 139.8 lb

## 2018-02-27 DIAGNOSIS — Z0000 Encounter for general adult medical examination without abnormal findings: Secondary | ICD-10-CM | POA: Diagnosis not present

## 2018-02-27 NOTE — Patient Instructions (Addendum)
  Ms. Jackquline BoschHester , Thank you for taking time to come for your Medicare Wellness Visit. I appreciate your ongoing commitment to your health goals. Please review the following plan we discussed and let me know if I can assist you in the future.   Follow up with Dr. Lorin PicketScott as needed.    Bring a copy of your Health Care Power of Attorney and/or Living Will to be scanned into chart.  Have a great day!  These are the goals we discussed: Goals    . Healthy Lifestyle     Maintain exercise regimen Stay hydrated! Drink plenty of water Choose low carb foods, fruits and vegetables       This is a list of the screening recommended for you and due dates:  Health Maintenance  Topic Date Due  . Mammogram  04/18/2018  . Colon Cancer Screening  08/20/2022  . Tetanus Vaccine  02/28/2026  . Flu Shot  Completed  . DEXA scan (bone density measurement)  Completed  .  Hepatitis C: One time screening is recommended by Center for Disease Control  (CDC) for  adults born from [birth year] through 1965.   Completed  . Pneumonia vaccines  Completed

## 2018-02-27 NOTE — Progress Notes (Addendum)
Subjective:   Angelica Robertson is a 72 y.o. female who presents for Medicare Annual (Subsequent) preventive examination.  Review of Systems:  No ROS.  Medicare Wellness Visit. Additional risk factors are reflected in the social history.  Cardiac Risk Factors include: advanced age (>61mn, >>25women);hypertension     Objective:     Vitals: BP 120/72 (BP Location: Left Arm, Patient Position: Sitting, Cuff Size: Normal)   Pulse 88   Temp 98.4 F (36.9 C) (Oral)   Resp 14   Ht 5' 4"  (1.626 m)   Wt 139 lb 12.8 oz (63.4 kg)   LMP 11/27/1979   SpO2 96%   BMI 24.00 kg/m   Body mass index is 24 kg/m.  Advanced Directives 02/27/2018 02/02/2017 02/03/2016 12/09/2012  Does Patient Have a Medical Advance Directive? Yes Yes Yes Patient has advance directive, copy not in chart  Type of Advance Directive [place] will Living will;Healthcare Power of [place] Living will  Does patient want to make changes to medical advance directive? No - Patient declined No - Patient declined No - Patient declined -  Copy of [place]landin Chart? No - copy requested No - copy requested No - copy requested Copy requested from other (Comment)  Some encounter information is confidential and restricted. Go to Review Flowsheets activity to see all data.    Tobacco Social History   Tobacco Use  Smoking Status Never Smoker  Smokeless Tobacco Never Used     Counseling given: Not Answered   Clinical Intake:  Pre-visit preparation completed: Yes  Pain : No/denies pain     Nutritional Status: BMI of 19-24  Normal Diabetes: No  How often do you need to have someone help you when you read instructions, pamphlets, or other written materials from your doctor or pharmacy?: 1 - Never  Interpreter Needed?: No     Past Medical History:  Diagnosis Date  . Depression 2000  . Heart murmur   . Hypercholesterolemia  Osteoporosis   Past Surgical History:  Procedure Laterality Date  . ABDOMINAL HYSTERECTOMY  1980   Family History  Problem Relation Age of Onset  . Heart disease Mother   . Diabetes Mother   . Heart disease Father   . Diabetes Father   . Colon cancer Unknown   . Congestive Heart Failure Brother   . Colon cancer Brother   . BRCA 1/2 Neg Hx   . Breast cancer Neg Hx   . Cowden syndrome Neg Hx   . DES usage Neg Hx   . Endometrial cancer Neg Hx   . Li-Fraumeni syndrome Neg Hx   . Ovarian cancer Neg Hx    Social History   Socioeconomic History  . Marital status: Married    Spouse name: None  . Number of children: None  . Years of education: None  . Highest education level: None  Social Needs  . Financial resource strain: Not hard at all  . Food insecurity - worry: Never true  . Food insecurity - inability: Never true  . Transportation needs - medical: No  . Transportation needs - non-medical: No  Occupational History  . None  Tobacco Use  . Smoking status: Never Smoker  . Smokeless tobacco: Never Used  Substance and Sexual Activity  . Alcohol use: Yes    Alcohol/week: 0.0 oz    Comment: occasionally - socially  . Drug use: No  . Sexual activity: Yes  Other Topics Concern  . None  Social History Narrative  . None    Outpatient Encounter Medications as of 02/27/2018  Medication Sig  . calcium carbonate (OS-CAL) 600 MG TABS tablet Take 600 mg by mouth 2 (two) times daily with a meal.  . Calcium-Magnesium-Zinc 333-133-5 MG TABS Take by mouth.  . Cholecalciferol (VITAMIN D PO) Take 250 mg by mouth 2 (two) times daily.  . Misc Natural Products (GLUCOSAMINE CHONDROITIN VIT D3 PO) Take 1 capsule by mouth.  . sertraline (ZOLOFT) 50 MG tablet TAKE 1 TABLET BY MOUTH ONCE DAILY  . simvastatin (ZOCOR) 20 MG tablet TAKE 1 TABLET BY MOUTH ONCE DAILY  . tiZANidine (ZANAFLEX) 2 MG tablet Take 1 tablet by mouth daily.   No facility-administered encounter medications on file as  of 02/27/2018.     Activities of Daily Living In your present state of health, do you have any difficulty performing the following activities: 02/27/2018  Hearing? N  Vision? N  Difficulty concentrating or making decisions? N  Walking or climbing stairs? N  Dressing or bathing? N  Doing errands, shopping? N  Preparing Food and eating ? N  Using the Toilet? N  In the past six months, have you accidently leaked urine? N  Do you have problems with loss of bowel control? N  Managing your Medications? N  Managing your Finances? N  Housekeeping or managing your Housekeeping? N  Some recent data might be hidden    Patient Care Team: Einar Pheasant, MD as PCP - General (Internal Medicine)    Assessment:   This is a routine wellness examination for Angelica Robertson.  The goal of the wellness visit is to assist the patient how to close the gaps in care and create a preventative care plan for the patient.   The roster of all physicians providing medical care to patient is listed in the Snapshot section of the chart.  Taking calcium VIT D as appropriate/Osteoporosis risk reviewed.    Safety issues reviewed; Smoke and carbon monoxide detectors in the home. No firearms or firearms locked in a safe within the home. Wears seatbelts when driving or riding with others. No violence in the home.  They do not have excessive sun exposure.  Discussed the need for sun protection: hats, long sleeves and the use of sunscreen if there is significant sun exposure.  Patient is alert, normal appearance, oriented to person/place/and time.  Correctly identified the president of the [place] and recalls of 3/3 words. Performs simple calculations and can read correct time from watch face. Displays appropriate judgement.  No new identified risk were noted.  No failures at ADL's or IADL's.    BMI- discussed the importance of a healthy diet, water intake and the benefits of aerobic exercise. Educational material provided.    24 hour diet recall: Low cholesterol diet  Dental- every 6 months.  Dermatology- visits every 2 years.  Eye- Visual acuity not assessed per patient preference since they have regular follow up with the ophthalmologist.  Wears corrective lenses.  Sleep patterns- Sleeps 7-8hours at night.  Wakes feeling rested.   Colonoscopy scheduled.  Health maintenance gaps- closed.  Patient Concerns: None at this time. Follow up with PCP as needed.  Exercise Activities and Dietary recommendations Current Exercise Habits: Home exercise routine, Time (Minutes): 60, Frequency (Times/Week): 5, Weekly Exercise (Minutes/Week): 300, Intensity: Mild  Goals    . Healthy Lifestyle     Maintain exercise regimen Stay hydrated! Drink plenty of water Choose low  carb foods, fruits and vegetables       Fall Risk Fall Risk  02/27/2018 02/02/2017 11/10/2016 02/24/2016 02/03/2016  Falls in the past year? No No No No No   Depression Screen PHQ 2/9 Scores 02/27/2018 02/02/2017 11/10/2016 02/24/2016  PHQ - 2 Score 0 0 0 0     Cognitive Function MMSE - Mini Mental State Exam 02/27/2018 02/02/2017 02/03/2016  Orientation to time 5 5 5   Orientation to Place 5 5 5   Registration 3 3 3   Attention/ Calculation 5 5 5   Recall 3 3 3   Language- name 2 objects 2 2 2   Language- repeat 1 1 1   Language- follow 3 step command 3 3 3   Language- read & follow direction 1 1 1   Write a sentence 1 1 1   Copy design 1 1 1   Total score 30 30 30         Immunization History  Administered Date(s) Administered  . Influenza Nasal 09/09/2012  . Influenza Split 01/07/2013, 09/15/2013  . Influenza, High Dose Seasonal PF 09/25/2017  . Influenza,inj,Quad PF,6+ Mos 08/13/2014, 08/26/2015  . Influenza-Unspecified 10/20/2016  . Pneumococcal Conjugate-13 11/25/2013  . Pneumococcal Polysaccharide-23 02/24/2016  . Tdap 02/29/2016  . Zoster 02/29/2016    Screening Tests Health Maintenance  Topic Date Due  . MAMMOGRAM  04/18/2018   . COLONOSCOPY  08/20/2022  . TETANUS/TDAP  02/28/2026  . INFLUENZA VACCINE  Completed  . DEXA SCAN  Completed  . Hepatitis C Screening  Completed  . PNA vac Low Risk Adult  Completed       Plan:    End of life planning; Advance aging; Advanced directives discussed. Copy of current HCPOA/Living Will requested.    I have personally reviewed and noted the following in the patient's chart:   . Medical and social history . Use of alcohol, tobacco or illicit drugs  . Current medications and supplements . Functional ability and status . Nutritional status . Physical activity . Advanced directives . List of other physicians . Hospitalizations, surgeries, and ER visits in previous 12 months . Vitals . Screenings to include cognitive, depression, and falls . Referrals and appointments  In addition, I have reviewed and discussed with patient certain preventive protocols, quality metrics, and best practice recommendations. A written personalized care plan for preventive services as well as general preventive health recommendations were provided to patient.     OBrien-Blaney, Jaquae Rieves L, LPN  5/90/9311    I have reviewed the above information and agree with above.   Deborra Medina, MD

## 2018-03-04 ENCOUNTER — Encounter: Payer: Self-pay | Admitting: *Deleted

## 2018-03-05 ENCOUNTER — Other Ambulatory Visit: Payer: Self-pay

## 2018-03-05 ENCOUNTER — Ambulatory Visit: Payer: Medicare Other | Admitting: Certified Registered Nurse Anesthetist

## 2018-03-05 ENCOUNTER — Encounter
Admission: RE | Disposition: A | Discharge status: Home / Self Care | Payer: Self-pay | Source: Ambulatory Visit | Attending: Internal Medicine

## 2018-03-05 ENCOUNTER — Ambulatory Visit
Admission: RE | Admit: 2018-03-05 | Discharge: 2018-03-05 | Disposition: A | Discharge status: Home / Self Care | Payer: Medicare Other | Source: Ambulatory Visit | Attending: Internal Medicine | Admitting: Internal Medicine

## 2018-03-05 DIAGNOSIS — Z79899 Other long term (current) drug therapy: Secondary | ICD-10-CM | POA: Insufficient documentation

## 2018-03-05 DIAGNOSIS — M810 Age-related osteoporosis without current pathological fracture: Secondary | ICD-10-CM | POA: Insufficient documentation

## 2018-03-05 DIAGNOSIS — Z86010 Personal history of colonic polyps: Secondary | ICD-10-CM | POA: Insufficient documentation

## 2018-03-05 DIAGNOSIS — Z7982 Long term (current) use of aspirin: Secondary | ICD-10-CM | POA: Insufficient documentation

## 2018-03-05 DIAGNOSIS — R011 Cardiac murmur, unspecified: Secondary | ICD-10-CM | POA: Diagnosis not present

## 2018-03-05 DIAGNOSIS — K640 First degree hemorrhoids: Secondary | ICD-10-CM | POA: Insufficient documentation

## 2018-03-05 DIAGNOSIS — Z1211 Encounter for screening for malignant neoplasm of colon: Secondary | ICD-10-CM | POA: Insufficient documentation

## 2018-03-05 DIAGNOSIS — E7800 Pure hypercholesterolemia, unspecified: Secondary | ICD-10-CM | POA: Diagnosis not present

## 2018-03-05 DIAGNOSIS — F329 Major depressive disorder, single episode, unspecified: Secondary | ICD-10-CM | POA: Diagnosis not present

## 2018-03-05 HISTORY — PX: COLONOSCOPY WITH PROPOFOL: SHX5780

## 2018-03-05 SURGERY — COLONOSCOPY WITH PROPOFOL
Anesthesia: General

## 2018-03-05 MED ORDER — PROPOFOL 10 MG/ML IV BOLUS
INTRAVENOUS | Status: AC
  Filled 2018-03-05: qty 20

## 2018-03-05 MED ORDER — PROPOFOL 10 MG/ML IV BOLUS
INTRAVENOUS | Status: DC | PRN
  Administered 2018-03-05: 40 mg via INTRAVENOUS
  Administered 2018-03-05: 50 mg via INTRAVENOUS

## 2018-03-05 MED ORDER — PROPOFOL 500 MG/50ML IV EMUL
INTRAVENOUS | Status: AC
  Filled 2018-03-05: qty 50

## 2018-03-05 MED ORDER — PROPOFOL 500 MG/50ML IV EMUL
INTRAVENOUS | Status: DC | PRN
  Administered 2018-03-05: 75 ug/kg/min via INTRAVENOUS

## 2018-03-05 MED ORDER — SODIUM CHLORIDE 0.9 % IV SOLN
INTRAVENOUS | Status: DC
  Administered 2018-03-05: 10:00:00 via INTRAVENOUS

## 2018-03-05 MED ORDER — LIDOCAINE HCL (CARDIAC) 20 MG/ML IV SOLN
INTRAVENOUS | Status: DC | PRN
  Administered 2018-03-05: 60 mg via INTRAVENOUS

## 2018-03-05 NOTE — Anesthesia Post-op Follow-up Note (Signed)
Anesthesia QCDR form completed.        

## 2018-03-05 NOTE — Transfer of Care (Signed)
Immediate Anesthesia Transfer of Care Note  Patient: Angelica Robertson  Procedure(s) Performed: COLONOSCOPY WITH PROPOFOL (N/A )  Patient Location: PACU  Anesthesia Type:General  Level of Consciousness: awake, alert  and oriented  Airway & Oxygen Therapy: Patient Spontanous Breathing and Patient connected to nasal cannula oxygen  Post-op Assessment: Report given to RN and Post -op Vital signs reviewed and stable  Post vital signs: stable  Last Vitals:  Vitals Value Taken Time  BP    Temp    Pulse    Resp    SpO2      Last Pain: There were no vitals filed for this visit.       Complications: No apparent anesthesia complications

## 2018-03-05 NOTE — Anesthesia Preprocedure Evaluation (Signed)
Anesthesia Evaluation  Patient identified by MRN, date of birth, ID band Patient awake    Reviewed: Allergy & Precautions, H&P , NPO status , Patient's Chart, lab work & pertinent test results, reviewed documented beta blocker date and time   Airway Mallampati: II   Neck ROM: full    Dental  (+) Poor Dentition   Pulmonary neg pulmonary ROS,    Pulmonary exam normal        Cardiovascular negative cardio ROS Normal cardiovascular exam Rhythm:regular Rate:Normal     Neuro/Psych PSYCHIATRIC DISORDERS Depression negative neurological ROS  negative psych ROS   GI/Hepatic negative GI ROS, Neg liver ROS,   Endo/Other  negative endocrine ROS  Renal/GU negative Renal ROS  negative genitourinary   Musculoskeletal   Abdominal   Peds  Hematology negative hematology ROS (+)   Anesthesia Other Findings Past Medical History: 2000: Depression No date: Heart murmur Osteoporosis: Hypercholesterolemia Past Surgical History: 1980: ABDOMINAL HYSTERECTOMY BMI    Body Mass Index:  22.66 kg/m     Reproductive/Obstetrics negative OB ROS                             Anesthesia Physical Anesthesia Plan  ASA: III  Anesthesia Plan: General   Post-op Pain Management:    Induction:   PONV Risk Score and Plan:   Airway Management Planned:   Additional Equipment:   Intra-op Plan:   Post-operative Plan:   Informed Consent: I have reviewed the patients History and Physical, chart, labs and discussed the procedure including the risks, benefits and alternatives for the proposed anesthesia with the patient or authorized representative who has indicated his/her understanding and acceptance.   Dental Advisory Given  Plan Discussed with: CRNA  Anesthesia Plan Comments:         Anesthesia Quick Evaluation

## 2018-03-05 NOTE — Op Note (Signed)
[facility] Gastroenterology Patient Name: Angelica Robertson Procedure Date: 03/05/2018 10:45 AM MRN: [id number] Account #: [id number] Date of Birth: [birth year] Admit Type: Outpatient Age: 72 Room: [facility] ENDO ROOM 4 Gender: Female Note Status: Finalized Procedure:            Colonoscopy Indications:          High risk colon cancer surveillance: Personal history                        of colonic polyps Providers:            Boykin Nearing. Norma Fredrickson MD, MD Referring MD:         Dale Pinewood, MD (Referring MD) Medicines:            Propofol per Anesthesia Complications:        No immediate complications. Procedure:            Pre-Anesthesia Assessment:                       - The risks and benefits of the procedure and the                        sedation options and risks were discussed with the                        patient. All questions were answered and informed                        consent was obtained.                       - Patient identification and proposed procedure were                        verified prior to the procedure by the nurse. The                        procedure was verified in the procedure room.                       - ASA Grade Assessment: III - A patient with severe                        systemic disease.                       - After reviewing the risks and benefits, the patient                        was deemed in satisfactory condition to undergo the                        procedure.                       After obtaining informed consent, the colonoscope was                        passed under direct vision. Throughout the procedure,  the patient's blood pressure, pulse, and oxygen                        saturations were monitored continuously. The                        Colonoscope was introduced through the anus and                        advanced to the the cecum, identified by appendiceal   orifice and ileocecal valve. The colonoscopy was                        performed without difficulty. The patient tolerated the                        procedure well. The quality of the bowel preparation                        was excellent. The ileocecal valve, appendiceal                        orifice, and rectum were photographed. Findings:      The perianal and digital rectal examinations were normal. Pertinent       negatives include normal sphincter tone and no palpable rectal lesions.      The colon (entire examined portion) appeared normal.      Non-bleeding internal hemorrhoids were found during retroflexion. The       hemorrhoids were Grade I (internal hemorrhoids that do not prolapse).      The exam was otherwise without abnormality. Impression:           - The entire examined colon is normal.                       - Non-bleeding internal hemorrhoids.                       - The examination was otherwise normal.                       - No specimens collected. Recommendation:       - Patient has a contact number available for                        emergencies. The signs and symptoms of potential                        delayed complications were discussed with the patient.                        Return to normal activities tomorrow. Written discharge                        instructions were provided to the patient.                       - Resume previous diet.                       - Continue present medications.                       -  Repeat colonoscopy in 5 years for surveillance.                       - The findings and recommendations were discussed with                        the patient and their spouse. Procedure Code(s):    --- Professional ---                       N8295, Colorectal cancer screening; colonoscopy on                        individual at high risk Diagnosis Code(s):    --- Professional ---                       K64.0, First degree hemorrhoids                        Z86.010, Personal history of colonic polyps CPT copyright 2016 American Medical Association. All rights reserved. The codes documented in this report are preliminary and upon coder review may  be revised to meet current compliance requirements. [name] MD, MD 03/05/2018 11:10:41 AM This report has been signed electronically. Number of Addenda: 0 Note Initiated On: 03/05/2018 10:45 AM Scope Withdrawal Time: 0 hours 7 minutes 41 seconds  Total Procedure Duration: 0 hours 12 minutes 33 seconds       [facility]

## 2018-03-05 NOTE — H&P (Signed)
Outpatient short stay form Pre-procedure 03/05/2018 10:05 AM Teodoro K. Norma Fredricksonoledo, M.D.  Primary Physician: Dale Durhamharlene Scott, M.D.  Reason for visit:  Personal hx of colon polyps.  History of present illness:  Patient presents for colonoscopy for polyp surveillance. The patient denies complaints of abdominal pain, significant change in bowel habits, or rectal bleeding.     Current Facility-Administered Medications:  .  0.9 %  sodium chloride infusion, , Intravenous, Continuous, [place], Boykin Nearingeodoro K, MD  Medications Prior to Admission  Medication Sig Dispense Refill Last Dose  . aspirin EC 81 MG tablet Take 81 mg by mouth daily.     . calcium carbonate (OS-CAL) 600 MG TABS tablet Take 600 mg by mouth 2 (two) times daily with a meal.   Taking  . Calcium-Magnesium-Zinc 333-133-5 MG TABS Take by mouth.   Taking  . Cholecalciferol (VITAMIN D PO) Take 250 mg by mouth 2 (two) times daily.   Taking  . Misc Natural Products (GLUCOSAMINE CHONDROITIN VIT D3 PO) Take 1 capsule by mouth.   Taking  . sertraline (ZOLOFT) 50 MG tablet TAKE 1 TABLET BY MOUTH ONCE DAILY 90 tablet 1 Taking  . simvastatin (ZOCOR) 20 MG tablet TAKE 1 TABLET BY MOUTH ONCE DAILY 90 tablet 1 Taking  . tiZANidine (ZANAFLEX) 2 MG tablet Take 1 tablet by mouth daily.   Taking     No Known Allergies   Past Medical History:  Diagnosis Date  . Depression 2000  . Heart murmur   . Hypercholesterolemia Osteoporosis    Review of systems:   Negative.   Physical Exam  Gen: Alert, oriented. Appears stated age.  HEENT: [place]/AT. PERRLA. Lungs: CTA, no wheezes. CV: RR nl S1, S2. Abd: soft, benign, no masses. BS+ Ext: No edema. Pulses 2+    Planned procedures: Colonoscopy. The patient understands the nature of the planned procedure, indications, risks, alternatives and potential complications including but not limited to bleeding, infection, perforation, damage to internal organs and possible oversedation/side effects from  anesthesia. The patient agrees and gives consent to proceed.  Please refer to procedure notes for findings, recommendations and patient disposition/instructions.    Teodoro K. Norma Fredricksonoledo, M.D. Gastroenterology 03/05/2018  10:05 AM

## 2018-03-05 NOTE — Interval H&P Note (Signed)
History and Physical Interval Note:  03/05/2018 10:06 AM  Angelica Robertson  has presented today for surgery, with the diagnosis of PHX ADEN POLYPS  The various methods of treatment have been discussed with the patient and family. After consideration of risks, benefits and other options for treatment, the patient has consented to  Procedure(s): COLONOSCOPY WITH PROPOFOL (N/A) as a surgical intervention .  The patient's history has been reviewed, patient examined, no change in status, stable for surgery.  I have reviewed the patient's chart and labs.  Questions were answered to the patient's satisfaction.     Nittanyoledo, Valeeodoro

## 2018-03-06 ENCOUNTER — Encounter: Payer: Self-pay | Admitting: Internal Medicine

## 2018-03-07 NOTE — Anesthesia Postprocedure Evaluation (Signed)
Anesthesia Post Note  Patient: Angelica Robertson  Procedure(s) Performed: COLONOSCOPY WITH PROPOFOL (N/A )  Patient location during evaluation: PACU Anesthesia Type: General Level of consciousness: awake and alert Pain management: pain level controlled Vital Signs Assessment: post-procedure vital signs reviewed and stable Respiratory status: spontaneous breathing, nonlabored ventilation, respiratory function stable and patient connected to nasal cannula oxygen Cardiovascular status: blood pressure returned to baseline and stable Postop Assessment: no apparent nausea or vomiting Anesthetic complications: no     Last Vitals:  Vitals:   03/05/18 1120 03/05/18 1130  BP: 113/63 126/79  Pulse: 80 75  Resp: 17 15  Temp:    SpO2: 100% 100%    Last Pain:  Vitals:   03/06/18 0730  TempSrc:   PainSc: 0-No pain                 Yevette EdwardsJames G Janine Reller

## 2018-03-19 ENCOUNTER — Other Ambulatory Visit: Payer: Self-pay | Admitting: Internal Medicine

## 2018-03-19 DIAGNOSIS — Z1231 Encounter for screening mammogram for malignant neoplasm of breast: Secondary | ICD-10-CM

## 2018-03-27 ENCOUNTER — Encounter: Payer: Self-pay | Admitting: Internal Medicine

## 2018-03-27 ENCOUNTER — Ambulatory Visit (INDEPENDENT_AMBULATORY_CARE_PROVIDER_SITE_OTHER): Payer: Medicare Other | Admitting: Internal Medicine

## 2018-03-27 VITALS — BP 120/78 | HR 76 | Temp 98.1°F | Resp 16 | Ht 64.0 in | Wt 136.6 lb

## 2018-03-27 DIAGNOSIS — E7800 Pure hypercholesterolemia, unspecified: Secondary | ICD-10-CM | POA: Diagnosis not present

## 2018-03-27 DIAGNOSIS — Z0000 Encounter for general adult medical examination without abnormal findings: Secondary | ICD-10-CM | POA: Diagnosis not present

## 2018-03-27 DIAGNOSIS — F329 Major depressive disorder, single episode, unspecified: Secondary | ICD-10-CM | POA: Diagnosis not present

## 2018-03-27 DIAGNOSIS — M542 Cervicalgia: Secondary | ICD-10-CM

## 2018-03-27 DIAGNOSIS — R739 Hyperglycemia, unspecified: Secondary | ICD-10-CM | POA: Diagnosis not present

## 2018-03-27 DIAGNOSIS — F32A Depression, unspecified: Secondary | ICD-10-CM

## 2018-03-27 LAB — BASIC METABOLIC PANEL
BUN: 14 mg/dL (ref 6–23)
CALCIUM: 9.8 mg/dL (ref 8.4–10.5)
CO2: 30 mEq/L (ref 19–32)
CREATININE: 0.71 mg/dL (ref 0.40–1.20)
Chloride: 100 mEq/L (ref 96–112)
GFR: 86.59 mL/min (ref >60.00)
Glucose, Bld: 105 mg/dL — ABNORMAL HIGH (ref 70–99)
Potassium: 4.3 mEq/L (ref 3.5–5.1)
Sodium: 138 mEq/L (ref 135–145)

## 2018-03-27 LAB — LIPID PANEL
Cholesterol: 188 mg/dL (ref 0–200)
HDL: 63.8 mg/dL (ref >39.00)
LDL CALC: 105 mg/dL — AB (ref 0–99)
NONHDL: 123.9
Total CHOL/HDL Ratio: 3
Triglycerides: 97 mg/dL (ref 0.0–149.0)
VLDL: 19.4 mg/dL (ref 0.0–40.0)

## 2018-03-27 LAB — HEPATIC FUNCTION PANEL
ALT: 18 U/L (ref 0–35)
AST: 19 U/L (ref 0–37)
Albumin: 4.5 g/dL (ref 3.5–5.2)
Alkaline Phosphatase: 54 U/L (ref 39–117)
BILIRUBIN TOTAL: 0.5 mg/dL (ref 0.2–1.2)
Bilirubin, Direct: 0.1 mg/dL (ref 0.0–0.3)
Total Protein: 7 g/dL (ref 6.0–8.3)

## 2018-03-27 LAB — HEMOGLOBIN A1C: HEMOGLOBIN A1C: 6 % (ref 4.6–6.5)

## 2018-03-27 LAB — TSH: TSH: 1.56 u[IU]/mL (ref 0.35–4.50)

## 2018-03-27 MED ORDER — SERTRALINE HCL 50 MG PO TABS: 50.0000 mg | ORAL_TABLET | Freq: Every day | ORAL | 1 refills | Status: DC

## 2018-03-27 MED ORDER — SIMVASTATIN 20 MG PO TABS: 20.0000 mg | ORAL_TABLET | Freq: Every day | ORAL | 1 refills | Status: DC

## 2018-03-27 NOTE — Assessment & Plan Note (Addendum)
Physical today 03/27/18.  Mammogram 04/18/17 - Birads I.  Colonoscopy 08/20/12.  F/u colonoscopy 03/05/18 - non bleeding internal hemorrhoids otherwise normal.  Schedule f/u mammogram.

## 2018-03-27 NOTE — Progress Notes (Signed)
Patient ID: Angelica Robertson, female   DOB: 05-Sep-1946, 72 y.o.   MRN: [id number]   Subjective:    Patient ID: Angelica Robertson, female    DOB: Apr 18, 1946, 72 y.o.   MRN: [id number]  HPI  Patient here for her physical exam.  Doing well.  Stays actvie.  Exercises regularly.  Goes to [employer] 5 days per week.  No chest pain.  No sob.  No acid reflux.  No abdominal pain.  Bowels moving.  No urine change.  Overall feels good.  Neck better.  Saw [facility].  Doing home exercises.  Not a significant problem for her now.  Some intermittent issues with restless legs.  She is rubbing coconut oil on her legs.  Helping.  Desires no further intervention.     Past Medical History:  Diagnosis Date  . Depression 2000  . Heart murmur   . Hypercholesterolemia Osteoporosis   Past Surgical History:  Procedure Laterality Date  . ABDOMINAL HYSTERECTOMY  1980  . COLONOSCOPY WITH PROPOFOL N/A 03/05/2018   Procedure: COLONOSCOPY WITH PROPOFOL;  Surgeon: Toledo, [name], MD;  Location: [facility] ENDOSCOPY;  Service: Endoscopy;  Laterality: N/A;   Family History  Problem Relation Age of Onset  . Heart disease Mother   . Diabetes Mother   . Heart disease Father   . Diabetes Father   . Colon cancer Unknown   . Congestive Heart Failure Brother   . Colon cancer Brother   . BRCA 1/2 Neg Hx   . Breast cancer Neg Hx   . Cowden syndrome Neg Hx   . DES usage Neg Hx   . Endometrial cancer Neg Hx   . Li-Fraumeni syndrome Neg Hx   . Ovarian cancer Neg Hx    Social History   Socioeconomic History  . Marital status: Married    Spouse name: Not on file  . Number of children: Not on file  . Years of education: Not on file  . Highest education level: Not on file  Occupational History  . Not on file  Social Needs  . Financial resource strain: Not hard at all  . Food insecurity:    Worry: Never true    Inability: Never true  . Transportation needs:    Medical: No    Non-medical: No  Tobacco Use  . Smoking status:  Never Smoker  . Smokeless tobacco: Never Used  Substance and Sexual Activity  . Alcohol use: Yes    Alcohol/week: 0.0 oz    Comment: occasionally - socially during the summer a glass of wine or beer  . Drug use: No  . Sexual activity: Yes  Lifestyle  . Physical activity:    Days per week: Not on file    Minutes per session: Not on file  . Stress: Not on file  Relationships  . Social connections:    Talks on phone: Not on file    Gets together: Not on file    Attends religious service: Not on file    Active member of club or organization: Not on file    Attends meetings of clubs or organizations: Not on file    Relationship status: Not on file  Other Topics Concern  . Not on file  Social History Narrative  . Not on file    Outpatient Encounter Medications as of 03/27/2018  Medication Sig  . aspirin EC 81 MG tablet Take 81 mg by mouth daily.  . calcium carbonate (OS-CAL) 600 MG TABS tablet Take  600 mg by mouth 2 (two) times daily with a meal.  . Calcium-Magnesium-Zinc 333-133-5 MG TABS Take by mouth.  . Cholecalciferol (VITAMIN D PO) Take 250 mg by mouth 2 (two) times daily.  . Misc Natural Products (GLUCOSAMINE CHONDROITIN VIT D3 PO) Take 1 capsule by mouth.  . sertraline (ZOLOFT) 50 MG tablet Take 1 tablet (50 mg total) by mouth daily.  . simvastatin (ZOCOR) 20 MG tablet Take 1 tablet (20 mg total) by mouth daily.  . [DISCONTINUED] sertraline (ZOLOFT) 50 MG tablet TAKE 1 TABLET BY MOUTH ONCE DAILY  . [DISCONTINUED] simvastatin (ZOCOR) 20 MG tablet TAKE 1 TABLET BY MOUTH ONCE DAILY  . [DISCONTINUED] tiZANidine (ZANAFLEX) 2 MG tablet Take 1 tablet by mouth daily.   No facility-administered encounter medications on file as of 03/27/2018.     Review of Systems  Constitutional: Negative for appetite change and unexpected weight change.  HENT: Negative for congestion and sinus pressure.   Eyes: Negative for pain and visual disturbance.  Respiratory: Negative for cough, chest  tightness and shortness of breath.   Cardiovascular: Negative for chest pain, palpitations and leg swelling.  Gastrointestinal: Negative for abdominal pain, diarrhea, nausea and vomiting.  Genitourinary: Negative for difficulty urinating and dysuria.  Musculoskeletal: Negative for joint swelling and myalgias.  Skin: Negative for color change and rash.  Neurological: Negative for dizziness, light-headedness and headaches.  Hematological: Negative for adenopathy. Does not bruise/bleed easily.  Psychiatric/Behavioral: Negative for agitation and dysphoric mood.      Objective:     Blood pressure rechecked by me:  120/72  Physical Exam  Constitutional: She is oriented to person, place, and time. She appears well-developed and well-nourished. No distress.  HENT:  Nose: Nose normal.  Mouth/Throat: Oropharynx is clear and moist.  Eyes: Right eye exhibits no discharge. Left eye exhibits no discharge. No scleral icterus.  Neck: Neck supple. No thyromegaly present.  Cardiovascular: Normal rate and regular rhythm.  Pulmonary/Chest: Breath sounds normal. No accessory muscle usage. No tachypnea. No respiratory distress. She has no decreased breath sounds. She has no wheezes. She has no rhonchi. Right breast exhibits no inverted nipple, no mass, no nipple discharge and no tenderness (no axillary adenopathy). Left breast exhibits no inverted nipple, no mass, no nipple discharge and no tenderness (no axilarry adenopathy).  Abdominal: Soft. Bowel sounds are normal. There is no tenderness.  Musculoskeletal: She exhibits no edema or tenderness.  Lymphadenopathy:    She has no cervical adenopathy.  Neurological: She is alert and oriented to person, place, and time.  Skin: Skin is warm. No rash noted. No erythema.  Psychiatric: She has a normal mood and affect. Her behavior is normal.    BP 120/78 (BP Location: Left Arm, Patient Position: Sitting, Cuff Size: Normal)   Pulse 76   Temp 98.1 F (36.7 C)  (Oral)   Resp 16   Ht _0  (1.626 m)   Wt 136 lb 9.6 oz (62 kg)   LMP 11/27/1979   SpO2 97%   BMI 23.45 kg/m  Wt Readings from Last 3 Encounters:  03/27/18 136 lb 9.6 oz (62 kg)  03/05/18 132 lb (59.9 kg)  02/27/18 139 lb 12.8 oz (63.4 kg)     Lab Results  Component Value Date   WBC 8.2 12/28/2017   HGB 13.7 12/28/2017   HCT 41.0 12/28/2017   PLT 373.0 12/28/2017   GLUCOSE 105 (H) 03/27/2018   CHOL 188 03/27/2018   TRIG 97.0 03/27/2018   HDL 63.80 03/27/2018  LDLCALC 105 (H) 03/27/2018   ALT 18 03/27/2018   AST 19 03/27/2018   NA 138 03/27/2018   K 4.3 03/27/2018   CL 100 03/27/2018   CREATININE 0.71 03/27/2018   BUN 14 03/27/2018   CO2 30 03/27/2018   TSH 1.56 03/27/2018   HGBA1C 6.0 03/27/2018    Mr Brain W ZE Contrast  Result Date: 01/18/2018 CLINICAL DATA:  72 year old female status post syncopal episode on January 6th. Neck stiffness. EXAM: MRI HEAD WITHOUT AND WITH CONTRAST TECHNIQUE: Multiplanar, multiecho pulse sequences of the brain and surrounding structures were obtained without and with intravenous contrast. CONTRAST:  44m MULTIHANCE GADOBENATE DIMEGLUMINE 529 MG/ML IV SOLN COMPARISON:  Cervical spine MRI 01/07/2018. Face CT without contrast 05/17/2006 FINDINGS: Brain: Normal cerebral volume. No restricted diffusion to suggest acute infarction. No midline shift, mass effect, evidence of mass lesion, ventriculomegaly, extra-axial collection or acute intracranial hemorrhage. Cervicomedullary junction and pituitary are within normal limits. GPearline Cablesand white matter signal is within normal limits for age throughout the brain. No cortical encephalomalacia or chronic cerebral blood products identified. No abnormal enhancement identified. No dural thickening. Vascular: Major intracranial vascular flow voids are preserved. The major dural venous sinuses are enhancing and appear patent. Skull and upper cervical spine: Stable and negative visible upper cervical spine. Normal  bone marrow signal. Sinuses/Orbits: Normal orbits soft tissues. Well pneumatized paranasal sinuses; trace sinus mucosal thickening. Other: Mastoid air cells are clear. Visible internal auditory structures appear normal. Scalp and face soft tissues appear negative. IMPRESSION: Normal for age MRI appearance of the brain. Electronically Signed   By: HGenevie AnnM.D.   On: 01/18/2018 12:37       Assessment & Plan:   Problem List Items Addressed This Visit    Depression    Doing well on her current regimen.  Follow.        Relevant Medications   sertraline (ZOLOFT) 50 MG tablet   Health care maintenance    Physical today 03/27/18.  Mammogram 04/18/17 - Birads I.  Colonoscopy 08/20/12.  F/u colonoscopy 03/05/18 - non bleeding internal hemorrhoids otherwise normal.  Schedule f/u mammogram.       Hypercholesterolemia - Primary    On simvastatin.  Low cholesterol diet and exercise.  Follow lipid panel and liver function tests.        Relevant Medications   simvastatin (ZOCOR) 20 MG tablet   Other Relevant Orders   Hepatic function panel (Completed)   Lipid panel (Completed)   TSH (Completed)   Hyperglycemia    Low carb diet and exercise.  Follow met b and a1c.       Relevant Orders   Hemoglobin A1c (Completed)   Basic metabolic panel (Completed)   Neck pain    Saw [facility].  Doing home exercises.  Doing well.  Follow.            SEinar Pheasant MD

## 2018-03-28 ENCOUNTER — Encounter: Payer: Self-pay | Admitting: Internal Medicine

## 2018-03-30 ENCOUNTER — Encounter: Payer: Self-pay | Admitting: Internal Medicine

## 2018-03-30 NOTE — Assessment & Plan Note (Signed)
Doing well on her current regimen.  Follow.  

## 2018-03-30 NOTE — Assessment & Plan Note (Signed)
On simvastatin.  Low cholesterol diet and exercise.  Follow lipid panel and liver function tests.   

## 2018-03-30 NOTE — Assessment & Plan Note (Signed)
Saw [facility].  Doing home exercises.  Doing well.  Follow.

## 2018-03-30 NOTE — Assessment & Plan Note (Signed)
Low carb diet and exercise.  Follow met b and a1c.  

## 2018-04-22 ENCOUNTER — Ambulatory Visit
Admission: RE | Admit: 2018-04-22 | Discharge: 2018-04-22 | Disposition: A | Discharge status: Home / Self Care | Payer: Medicare Other | Source: Ambulatory Visit | Attending: Internal Medicine | Admitting: Internal Medicine

## 2018-04-22 DIAGNOSIS — Z1231 Encounter for screening mammogram for malignant neoplasm of breast: Secondary | ICD-10-CM | POA: Insufficient documentation

## 2018-10-01 ENCOUNTER — Encounter: Payer: Self-pay | Admitting: Internal Medicine

## 2018-10-01 ENCOUNTER — Ambulatory Visit: Payer: Medicare Other | Admitting: Internal Medicine

## 2018-10-01 DIAGNOSIS — Z23 Encounter for immunization: Secondary | ICD-10-CM | POA: Diagnosis not present

## 2018-10-01 DIAGNOSIS — F32A Depression, unspecified: Secondary | ICD-10-CM

## 2018-10-01 DIAGNOSIS — M542 Cervicalgia: Secondary | ICD-10-CM | POA: Diagnosis not present

## 2018-10-01 DIAGNOSIS — F329 Major depressive disorder, single episode, unspecified: Secondary | ICD-10-CM | POA: Diagnosis not present

## 2018-10-01 DIAGNOSIS — E7800 Pure hypercholesterolemia, unspecified: Secondary | ICD-10-CM | POA: Diagnosis not present

## 2018-10-01 DIAGNOSIS — R739 Hyperglycemia, unspecified: Secondary | ICD-10-CM | POA: Diagnosis not present

## 2018-10-01 LAB — HEPATIC FUNCTION PANEL
ALBUMIN: 4.4 g/dL (ref 3.5–5.2)
ALT: 19 U/L (ref 0–35)
AST: 18 U/L (ref 0–37)
Alkaline Phosphatase: 51 U/L (ref 39–117)
BILIRUBIN DIRECT: 0.1 mg/dL (ref 0.0–0.3)
TOTAL PROTEIN: 6.7 g/dL (ref 6.0–8.3)
Total Bilirubin: 0.6 mg/dL (ref 0.2–1.2)

## 2018-10-01 LAB — BASIC METABOLIC PANEL
BUN: 17 mg/dL (ref 6–23)
CHLORIDE: 100 meq/L (ref 96–112)
CO2: 32 meq/L (ref 19–32)
Calcium: 10.3 mg/dL (ref 8.4–10.5)
Creatinine, Ser: 0.75 mg/dL (ref 0.40–1.20)
GFR: 81.16 mL/min (ref >60.00)
Glucose, Bld: 117 mg/dL — ABNORMAL HIGH (ref 70–99)
Potassium: 4.4 mEq/L (ref 3.5–5.1)
Sodium: 138 mEq/L (ref 135–145)

## 2018-10-01 LAB — LIPID PANEL
CHOL/HDL RATIO: 3
Cholesterol: 163 mg/dL (ref 0–200)
HDL: 58.8 mg/dL (ref >39.00)
LDL Cholesterol: 74 mg/dL (ref 0–99)
NonHDL: 104.66
TRIGLYCERIDES: 153 mg/dL — AB (ref 0.0–149.0)
VLDL: 30.6 mg/dL (ref 0.0–40.0)

## 2018-10-01 LAB — HEMOGLOBIN A1C: Hgb A1c MFr Bld: 5.8 % (ref 4.6–6.5)

## 2018-10-01 MED ORDER — SERTRALINE HCL 50 MG PO TABS: 50.0000 mg | ORAL_TABLET | Freq: Every day | ORAL | 1 refills | Status: DC

## 2018-10-01 MED ORDER — SIMVASTATIN 20 MG PO TABS: 20.0000 mg | ORAL_TABLET | Freq: Every day | ORAL | 1 refills | Status: DC

## 2018-10-01 NOTE — Assessment & Plan Note (Signed)
Neck is doing better.  Feels better.  Follow.  Doing home exercises.

## 2018-10-01 NOTE — Assessment & Plan Note (Signed)
On simvastatin.  Low cholesterol diet and exercise.  Follow lipid panel and liver function tests.   

## 2018-10-01 NOTE — Assessment & Plan Note (Signed)
Has decreased carb intake.  Exercising.  Follow met b and a1c.

## 2018-10-01 NOTE — Progress Notes (Signed)
Patient ID: PAISLEA HATTON, female   DOB: 25-Dec-1945, 72 y.o.   MRN: [id number]   Subjective:    Patient ID: LUX SKILTON, female    DOB: [birth year], 72 y.o.   MRN: [id number]  HPI  Patient here for a scheduled follow up.  She reports she is doing well.  Exercising regularly.  Adjusted her diet.  Monitoring carb intake.  No chest pain.  No sob.  No acid reflux.  No abdominal pain.  Bowels moving.  Overall feels good.  Just celebrated her 50th wedding anniversary.  Went to [place].     Past Medical History:  Diagnosis Date  . Depression 2000  . Heart murmur   . Hypercholesterolemia Osteoporosis   Past Surgical History:  Procedure Laterality Date  . ABDOMINAL HYSTERECTOMY  1980  . COLONOSCOPY WITH PROPOFOL N/A 03/05/2018   Procedure: COLONOSCOPY WITH PROPOFOL;  Surgeon: Toledo, [name], MD;  Location: [facility] ENDOSCOPY;  Service: Endoscopy;  Laterality: N/A;   Family History  Problem Relation Age of Onset  . Heart disease Mother   . Diabetes Mother   . Heart disease Father   . Diabetes Father   . Colon cancer Unknown   . Congestive Heart Failure Brother   . Colon cancer Brother   . BRCA 1/2 Neg Hx   . Breast cancer Neg Hx   . Cowden syndrome Neg Hx   . DES usage Neg Hx   . Endometrial cancer Neg Hx   . Li-Fraumeni syndrome Neg Hx   . Ovarian cancer Neg Hx    Social History   Socioeconomic History  . Marital status: Married    Spouse name: Not on file  . Number of children: Not on file  . Years of education: Not on file  . Highest education level: Not on file  Occupational History  . Not on file  Social Needs  . Financial resource strain: Not hard at all  . Food insecurity:    Worry: Never true    Inability: Never true  . Transportation needs:    Medical: No    Non-medical: No  Tobacco Use  . Smoking status: Never Smoker  . Smokeless tobacco: Never Used  Substance and Sexual Activity  . Alcohol use: Yes    Alcohol/week: 0.0 standard drinks    Comment: occasionally  - socially during the summer a glass of wine or beer  . Drug use: No  . Sexual activity: Yes  Lifestyle  . Physical activity:    Days per week: Not on file    Minutes per session: Not on file  . Stress: Not on file  Relationships  . Social connections:    Talks on phone: Not on file    Gets together: Not on file    Attends religious service: Not on file    Active member of club or organization: Not on file    Attends meetings of clubs or organizations: Not on file    Relationship status: Not on file  Other Topics Concern  . Not on file  Social History Narrative  . Not on file    Outpatient Encounter Medications as of 10/01/2018  Medication Sig  . aspirin EC 81 MG tablet Take 81 mg by mouth daily.  . calcium carbonate (OS-CAL) 600 MG TABS tablet Take 600 mg by mouth 2 (two) times daily with a meal.  . Calcium-Magnesium-Zinc 333-133-5 MG TABS Take by mouth.  . Cholecalciferol (VITAMIN D PO) Take 250 mg by mouth 2 (  two) times daily.  . Misc Natural Products (GLUCOSAMINE CHONDROITIN VIT D3 PO) Take 1 capsule by mouth.  . sertraline (ZOLOFT) 50 MG tablet Take 1 tablet (50 mg total) by mouth daily.  . simvastatin (ZOCOR) 20 MG tablet Take 1 tablet (20 mg total) by mouth daily.  . [DISCONTINUED] sertraline (ZOLOFT) 50 MG tablet Take 1 tablet (50 mg total) by mouth daily.  . [DISCONTINUED] simvastatin (ZOCOR) 20 MG tablet Take 1 tablet (20 mg total) by mouth daily.   No facility-administered encounter medications on file as of 10/01/2018.     Review of Systems  Constitutional: Negative for appetite change and unexpected weight change.  HENT: Negative for congestion and sinus pressure.   Respiratory: Negative for cough, chest tightness and shortness of breath.   Cardiovascular: Negative for chest pain, palpitations and leg swelling.  Gastrointestinal: Negative for abdominal pain, diarrhea, nausea and vomiting.  Genitourinary: Negative for difficulty urinating and dysuria.    Musculoskeletal: Negative for joint swelling and myalgias.  Skin: Negative for color change and rash.  Neurological: Negative for dizziness, light-headedness and headaches.  Psychiatric/Behavioral: Negative for agitation and dysphoric mood.       Objective:     Blood pressure rechecked by me:  126/78  Physical Exam  Constitutional: She appears well-developed and well-nourished. No distress.  HENT:  Nose: Nose normal.  Mouth/Throat: Oropharynx is clear and moist.  Neck: Neck supple. No thyromegaly present.  Cardiovascular: Normal rate and regular rhythm.  Pulmonary/Chest: Breath sounds normal. No respiratory distress. She has no wheezes.  Abdominal: Soft. Bowel sounds are normal. There is no tenderness.  Musculoskeletal: She exhibits no edema or tenderness.  Lymphadenopathy:    She has no cervical adenopathy.  Skin: No rash noted. No erythema.  Psychiatric: She has a normal mood and affect. Her behavior is normal.    BP 130/78 (BP Location: Left Arm, Patient Position: Sitting, Cuff Size: Normal)   Pulse 68   Temp 98 F (36.7 C) (Oral)   Resp 18   Wt 137 lb 12.8 oz (62.5 kg)   LMP 11/27/1979   SpO2 97%   BMI 23.65 kg/m  Wt Readings from Last 3 Encounters:  10/01/18 137 lb 12.8 oz (62.5 kg)  03/27/18 136 lb 9.6 oz (62 kg)  03/05/18 132 lb (59.9 kg)     Lab Results  Component Value Date   WBC 8.2 12/28/2017   HGB 13.7 12/28/2017   HCT 41.0 12/28/2017   PLT 373.0 12/28/2017   GLUCOSE 105 (H) 03/27/2018   CHOL 188 03/27/2018   TRIG 97.0 03/27/2018   HDL 63.80 03/27/2018   LDLCALC 105 (H) 03/27/2018   ALT 18 03/27/2018   AST 19 03/27/2018   NA 138 03/27/2018   K 4.3 03/27/2018   CL 100 03/27/2018   CREATININE 0.71 03/27/2018   BUN 14 03/27/2018   CO2 30 03/27/2018   TSH 1.56 03/27/2018   HGBA1C 6.0 03/27/2018    Mm 3d Screen Breast Bilateral  Result Date: 04/22/2018 CLINICAL DATA:  Screening. EXAM: DIGITAL SCREENING BILATERAL MAMMOGRAM WITH TOMO AND CAD  COMPARISON:  Previous exam(s). ACR Breast Density Category b: There are scattered areas of fibroglandular density. FINDINGS: There are no findings suspicious for malignancy. Images were processed with CAD. IMPRESSION: No mammographic evidence of malignancy. A result letter of this screening mammogram will be mailed directly to the patient. RECOMMENDATION: Screening mammogram in one year. (Code:SM-B-01Y) BI-RADS CATEGORY  1: Negative. Electronically Signed   By: Dedra Skeens.D.  On: 04/22/2018 12:08       Assessment & Plan:   Problem List Items Addressed This Visit    Cervicalgia    Neck is doing better.  Feels better.  Follow.  Doing home exercises.        Depression    Doing well on current regimen.  Follow.        Relevant Medications   sertraline (ZOLOFT) 50 MG tablet   Hypercholesterolemia    On simvastatin.  Low cholesterol diet and exercise.  Follow lipid panel and liver function tests.        Relevant Medications   simvastatin (ZOCOR) 20 MG tablet   Other Relevant Orders   Hepatic function panel   Lipid panel   Hyperglycemia    Has decreased carb intake.  Exercising.  Follow met b and a1c.        Relevant Orders   Hemoglobin W9N   Basic metabolic panel    Other Visit Diagnoses    Encounter for immunization       Relevant Orders   Flu vaccine HIGH DOSE PF (Completed)       Einar Pheasant, MD

## 2018-10-01 NOTE — Assessment & Plan Note (Signed)
Doing well on current regimen.  Follow.   

## 2018-10-02 ENCOUNTER — Encounter: Payer: Self-pay | Admitting: Internal Medicine

## 2019-03-03 ENCOUNTER — Ambulatory Visit: Payer: Self-pay

## 2019-03-11 ENCOUNTER — Ambulatory Visit (INDEPENDENT_AMBULATORY_CARE_PROVIDER_SITE_OTHER): Payer: Medicare Other

## 2019-03-11 ENCOUNTER — Other Ambulatory Visit: Payer: Self-pay

## 2019-03-11 DIAGNOSIS — Z0000 Encounter for general adult medical examination without abnormal findings: Secondary | ICD-10-CM | POA: Diagnosis not present

## 2019-03-11 NOTE — Progress Notes (Signed)
Subjective:   Angelica Robertson is a 73 y.o. female who presents for Medicare Annual (Subsequent) preventive examination.  Review of Systems:  No ROS.  Medicare Wellness Visit. Additional risk factors are reflected in the social history. Cardiac Risk Factors include: advanced age (>102mn, >>62women)     Objective:     Vitals: LMP 11/27/1979   There is no height or weight on file to calculate BMI.  Advanced Directives 03/11/2019 03/05/2018 02/27/2018 02/02/2017 02/03/2016 12/09/2012  Does Patient Have a Medical Advance Directive? _0  Patient has advance directive, copy not in chart  Type of Advance Directive Living [name] Living will;Healthcare Power of [place] Living will  Does patient want to make changes to medical advance directive? No - Patient declined - No - Patient declined No - Patient declined No - Patient declined -  Copy of [place]? - No - copy requested No - copy requested No - copy requested No - copy requested Copy requested from other (Comment)  Some encounter information is confidential and restricted. Go to Review Flowsheets activity to see all data.    Tobacco Social History   Tobacco Use  Smoking Status Never Smoker  Smokeless Tobacco Never Used     Counseling given: Not Answered   Clinical Intake:  Pre-visit preparation completed: Yes        Diabetes: No  How often do you need to have someone help you when you read instructions, pamphlets, or other written materials from your doctor or pharmacy?: 1 - Never  Interpreter Needed?: No     Past Medical History:  Diagnosis Date  . Depression 2000  . Heart murmur   . Hypercholesterolemia Osteoporosis   Past Surgical History:  Procedure Laterality Date  . ABDOMINAL HYSTERECTOMY  1980  . COLONOSCOPY WITH PROPOFOL N/A 03/05/2018   Procedure:  COLONOSCOPY WITH PROPOFOL;  Surgeon: Toledo, TBenay Pike MD;  Location: [facility] ENDOSCOPY;  Service: Endoscopy;  Laterality: N/A;   Family History  Problem Relation Age of Onset  . Heart disease Mother   . Diabetes Mother   . Heart disease Father   . Diabetes Father   . Colon cancer Other   . Congestive Heart Failure Brother   . Colon cancer Brother   . BRCA 1/2 Neg Hx   . Breast cancer Neg Hx   . Cowden syndrome Neg Hx   . DES usage Neg Hx   . Endometrial cancer Neg Hx   . Li-Fraumeni syndrome Neg Hx   . Ovarian cancer Neg Hx    Social History   Socioeconomic History  . Marital status: Married    Spouse name: Not on file  . Number of children: Not on file  . Years of education: Not on file  . Highest education level: Not on file  Occupational History  . Not on file  Social Needs  . Financial resource strain: Not hard at all  . Food insecurity:    Worry: Never true    Inability: Never true  . Transportation needs:    Medical: No    Non-medical: No  Tobacco Use  . Smoking status: Never Smoker  . Smokeless tobacco: Never Used  Substance and Sexual Activity  . Alcohol use: Yes    Alcohol/week: 0.0 standard drinks    Comment: occasionally - socially during the summer a glass of wine or beer  .  Drug use: No  . Sexual activity: Yes  Lifestyle  . Physical activity:    Days per week: 5 days    Minutes per session: 30 min  . Stress: Not at all  Relationships  . Social connections:    Talks on phone: Not on file    Gets together: Not on file    Attends religious service: Not on file    Active member of club or organization: Not on file    Attends meetings of clubs or organizations: Not on file    Relationship status: Not on file  Other Topics Concern  . Not on file  Social History Narrative  . Not on file    Outpatient Encounter Medications as of 03/11/2019  Medication Sig  . aspirin EC 81 MG tablet Take 81 mg by mouth daily.  . calcium carbonate (OS-CAL) 600 MG  TABS tablet Take 600 mg by mouth 2 (two) times daily with a meal.  . Calcium-Magnesium-Zinc 333-133-5 MG TABS Take by mouth.  . Cholecalciferol (VITAMIN D PO) Take 250 mg by mouth 2 (two) times daily.  . Misc Natural Products (GLUCOSAMINE CHONDROITIN VIT D3 PO) Take 1 capsule by mouth.  . sertraline (ZOLOFT) 50 MG tablet Take 1 tablet (50 mg total) by mouth daily.  . simvastatin (ZOCOR) 20 MG tablet Take 1 tablet (20 mg total) by mouth daily.   No facility-administered encounter medications on file as of 03/11/2019.     Activities of Daily Living In your present state of health, do you have any difficulty performing the following activities: 03/11/2019  Hearing? N  Vision? N  Difficulty concentrating or making decisions? N  Walking or climbing stairs? N  Dressing or bathing? N  Doing errands, shopping? N  Preparing Food and eating ? N  Using the Toilet? N  In the past six months, have you accidently leaked urine? N  Do you have problems with loss of bowel control? N  Managing your Medications? N  Managing your Finances? N  Housekeeping or managing your Housekeeping? N  Some recent data might be hidden    Patient Care Team: Einar Pheasant, MD as PCP - General (Internal Medicine)    Assessment:   This is a routine wellness examination for Angelica Robertson.  Virtual Visit via Video Note  I connected with Angelica Robertson on 03/11/19 at 10:00 AM EDT by a video enabled telemedicine application and verified that I am speaking with the correct person using two identifiers.   Health Screenings  Mammogram -04/22/18. She plans to schedule. Colonoscopy -03/05/18 Bone Density 11-05-17 Glaucoma -none Hearing-demonstrates normal hearing during conversation. Hemoglobin A1C -10/01/18 (5.8) Cholesterol -10/01/18 (163) Dental- every 9 months Vision- every 12 months  Social  Alcohol intake yes, rare Smoking history- none Smokers in home? none Illicit drug use? none Exercise -walking with 2lb weights 5  days, 30 minutes.  Diet -regular Sexually Active -yes Multiple Partners -no  Safety  Patient feels safe at home.  Patient does have smoke detectors at home  Patient does wear sunscreen or protective clothing when in direct sunlight. Patient does wear seat belt when driving or riding with others.   Activities of Daily Living Patient can do their own household chores. Denies needing assistance with: driving, feeding themselves, getting from bed to chair, getting to the toilet, bathing/showering, dressing, managing money, climbing flight of stairs, or preparing meals.   Depression Screen Patient denies losing interest in daily life, feeling hopeless, or crying easily over simple problems.  Fall Screen Patient denies being afraid of falling or falling in the last year.   Memory Screen Patient denies problems with memory, misplacing items, and is able to balance checkbook/bank accounts.  Patient is alert, normal appearance, oriented to person/place/and time. Correctly identified the president of the [place], recall of 3/3 objects, and performing simple calculations.  Patient displays appropriate judgement and can read correct time from watch face.   Immunizations The following Immunizations are up to date: Influenza, shingles, pneumonia, and tetanus.   Other Providers Patient Care Team: Einar Pheasant, MD as PCP - General (Internal Medicine)  Exercise Activities and Dietary recommendations Current Exercise Habits: Home exercise routine, Type of exercise: walking, Time (Minutes): 30, Frequency (Times/Week): 5, Weekly Exercise (Minutes/Week): 150  Goals      Patient Stated   . DIET -Snack Healthier (pt-stated)    . Increase physical activity (pt-stated)     Resume exercise regimen at [employer] when able.       Fall Risk Fall Risk  03/11/2019 02/27/2018 02/02/2017 11/10/2016 02/24/2016  Falls in the past year? 0 No No No No   Depression Screen PHQ 2/9 Scores 03/11/2019  02/27/2018 02/02/2017 11/10/2016  PHQ - 2 Score 0 0 0 0     Cognitive Function MMSE - Mini Mental State Exam 02/27/2018 02/02/2017 02/03/2016  Orientation to time _0 Orientation to Place _1 Registration _2 Attention/ Calculation _3 Recall _4 Language- name 2 objects _5 Language- repeat _6 Language- follow 3 step command _7 Language- read & follow direction _8 Write a sentence _9 Copy design _10 Total score _11 6CIT Screen 03/11/2019  What Year? 0 points  What month? 0 points  What time? 0 points  Count back from 20 0 points  Months in reverse 0 points  Repeat phrase 0 points  Total Score 0    Immunization History  Administered Date(s) Administered  . Influenza Nasal 09/09/2012  . Influenza Split 01/07/2013, 09/15/2013  . Influenza, High Dose Seasonal PF 09/25/2017, 10/01/2018  . Influenza,inj,Quad PF,6+ Mos 08/13/2014, 08/26/2015  . Influenza-Unspecified 10/20/2016  . Pneumococcal Conjugate-13 11/25/2013  . Pneumococcal Polysaccharide-23 02/24/2016  . Tdap 02/29/2016  . Zoster 02/29/2016   Screening Tests Health Maintenance  Topic Date Due  . MAMMOGRAM  04/23/2019  . TETANUS/TDAP  02/28/2026  . COLONOSCOPY  03/05/2028  . INFLUENZA VACCINE  Completed  . DEXA SCAN  Completed  . Hepatitis C Screening  Completed  . PNA vac Low Risk Adult  Completed      Plan:    End of life planning; Advance aging; Advanced directives discussed. Copy of current HCPOA/Living Will requested.    Keep all routine scheduled appointments.  I have personally reviewed and noted the following in the patient's chart:   . Medical and social history . Use of alcohol, tobacco or illicit drugs  . Current medications and supplements . Functional ability and status . Nutritional status . Physical activity . Advanced directives . List of other physicians . Hospitalizations, surgeries, and ER visits in previous 12 months . Vitals .  Screenings to include cognitive, depression, and falls . Referrals and appointments   Varney Biles, LPN  7/98/9211   Reviewed above information.  Agree with assessment and plan.    Dr Nicki Reaper

## 2019-03-11 NOTE — Patient Instructions (Addendum)
  Ms. Nolf , Thank you for taking time to come for your Medicare Wellness Visit. I appreciate your ongoing commitment to your health goals. Please review the following plan we discussed and let me know if I can assist you in the future.   Follow up with Dr. Lorin Picket as needed.    Bring a copy of your Health Care Power of Attorney and/or Living Will to be scanned into chart.  Have a great day!  These are the goals we discussed: Goals      Patient Stated   . DIET -Snack Healthier (pt-stated)    . Increase physical activity (pt-stated)     Resume exercise regimen at [employer] when able.       This is a list of the screening recommended for you and due dates:  Health Maintenance  Topic Date Due  . Mammogram  04/23/2019  . Tetanus Vaccine  02/28/2026  . Colon Cancer Screening  03/05/2028  . Flu Shot  Completed  . DEXA scan (bone density measurement)  Completed  .  Hepatitis C: One time screening is recommended by Center for Disease Control  (CDC) for  adults born from [birth year] through 1963.   Completed  . Pneumonia vaccines  Completed

## 2019-03-13 ENCOUNTER — Other Ambulatory Visit: Payer: Self-pay | Admitting: Internal Medicine

## 2019-03-13 DIAGNOSIS — Z1231 Encounter for screening mammogram for malignant neoplasm of breast: Secondary | ICD-10-CM

## 2019-03-28 ENCOUNTER — Telehealth: Payer: Self-pay | Admitting: *Deleted

## 2019-03-28 NOTE — Telephone Encounter (Signed)
Copied from CRM [id number]. Topic: Appointment Scheduling - Scheduling Inquiry for Clinic >> Mar 28, 2019 11:43 AM Baldo Daub L wrote: Reason for CRM:   Pt calling to find out what she needs to do about her OV next week.

## 2019-03-28 NOTE — Telephone Encounter (Signed)
Left detailed message. Looks like pt was switched to doxy visit

## 2019-03-31 ENCOUNTER — Encounter: Payer: Self-pay | Admitting: Internal Medicine

## 2019-03-31 ENCOUNTER — Ambulatory Visit (INDEPENDENT_AMBULATORY_CARE_PROVIDER_SITE_OTHER): Payer: Medicare Other | Admitting: Internal Medicine

## 2019-03-31 ENCOUNTER — Encounter: Payer: Medicare Other | Admitting: Internal Medicine

## 2019-03-31 DIAGNOSIS — R739 Hyperglycemia, unspecified: Secondary | ICD-10-CM | POA: Diagnosis not present

## 2019-03-31 DIAGNOSIS — F329 Major depressive disorder, single episode, unspecified: Secondary | ICD-10-CM

## 2019-03-31 DIAGNOSIS — F32A Depression, unspecified: Secondary | ICD-10-CM

## 2019-03-31 DIAGNOSIS — M542 Cervicalgia: Secondary | ICD-10-CM | POA: Diagnosis not present

## 2019-03-31 DIAGNOSIS — E7800 Pure hypercholesterolemia, unspecified: Secondary | ICD-10-CM

## 2019-03-31 MED ORDER — SERTRALINE HCL 50 MG PO TABS: 50.0000 mg | ORAL_TABLET | Freq: Every day | ORAL | 1 refills | Status: DC

## 2019-03-31 MED ORDER — SIMVASTATIN 20 MG PO TABS: 20.0000 mg | ORAL_TABLET | Freq: Every day | ORAL | 1 refills | Status: DC

## 2019-03-31 NOTE — Assessment & Plan Note (Signed)
She is exercising.  Neck is doing better.  No pain.  Follow.

## 2019-03-31 NOTE — Assessment & Plan Note (Signed)
Doing well on current regimen.  Follow.  Continue zoloft.  

## 2019-03-31 NOTE — Assessment & Plan Note (Signed)
On simvastatin.  Low cholesterol diet and exercise.  Follow lipid panel and liver function tests.   

## 2019-03-31 NOTE — Progress Notes (Signed)
Patient ID: Angelica Robertson, female   DOB: 11-16-1946, 73 y.o.   MRN: [id number] Virtual Visit via Video: Note  This visit type was conducted due to national recommendations for restrictions regarding the COVID-19 pandemic (e.g. social distancing).  This format is felt to be most appropriate for this patient at this time.  All issues noted in this document were discussed and addressed.  No physical exam was performed (except for noted visual exam findings with Video Visits).   I connected with Angelica Robertson on 03/31/19 at  8:30 AM EDT by a video enabled telemedicine application.  Verified that I am speaking with the correct person using two identifiers. Location patient: home Location provider: work Persons participating in the virtual visit: patient, provider  I discussed the limitations, risks, security and privacy concerns of performing an evaluation and management service by video and the availability of in person appointments. . The patient expressed understanding and agreed to proceed.   Reason for visit: scheduled follow up.   HPI: She reports she is doing well.  Trying to stay in.  No known COVID exposure.  No fever, cough, congestion or sob currently.  States in 01/2019, her and her husband had cough, fever and congestion.  Symptoms completely resolved with no residual problems.  States she has been exercising daily - walking 30-45 minutes per day.  No chest pain.  No increased heart rate or palpitations.  No acid reflux.  No abdominal pain.  Bowels moving.  Neck is doing well.  Not requiring pain medication or muscle relaxer.  Doing her exercises.  Sleeping well.  Handling stress well.  Taking zoloft.  Working well.     ROS: See pertinent positives and negatives per HPI.  Past Medical History:  Diagnosis Date  . Depression 2000  . Heart murmur   . Hypercholesterolemia Osteoporosis    Past Surgical History:  Procedure Laterality Date  . ABDOMINAL HYSTERECTOMY  1980  . COLONOSCOPY WITH  PROPOFOL N/A 03/05/2018   Procedure: COLONOSCOPY WITH PROPOFOL;  Surgeon: Toledo, [name], MD;  Location: [facility] ENDOSCOPY;  Service: Endoscopy;  Laterality: N/A;    Family History  Problem Relation Age of Onset  . Heart disease Mother   . Diabetes Mother   . Heart disease Father   . Diabetes Father   . Colon cancer Other   . Congestive Heart Failure Brother   . Colon cancer Brother   . BRCA 1/2 Neg Hx   . Breast cancer Neg Hx   . Cowden syndrome Neg Hx   . DES usage Neg Hx   . Endometrial cancer Neg Hx   . Li-Fraumeni syndrome Neg Hx   . Ovarian cancer Neg Hx     SOCIAL HX: reviewed.    Current Outpatient Medications:  .  aspirin EC 81 MG tablet, Take 81 mg by mouth daily., Disp: , Rfl:  .  calcium carbonate (OS-CAL) 600 MG TABS tablet, Take 600 mg by mouth 2 (two) times daily with a meal., Disp: , Rfl:  .  Calcium-Magnesium-Zinc 333-133-5 MG TABS, Take by mouth., Disp: , Rfl:  .  Cholecalciferol (VITAMIN D PO), Take 250 mg by mouth 2 (two) times daily., Disp: , Rfl:  .  Misc Natural Products (GLUCOSAMINE CHONDROITIN VIT D3 PO), Take 1 capsule by mouth., Disp: , Rfl:  .  sertraline (ZOLOFT) 50 MG tablet, Take 1 tablet (50 mg total) by mouth daily., Disp: 90 tablet, Rfl: 1 .  simvastatin (ZOCOR) 20 MG tablet, Take 1 tablet (  20 mg total) by mouth daily., Disp: 90 tablet, Rfl: 1  EXAM:  VITALS per patient if applicable: per pt, blood pressure yesterday pm: 129/66 and this am 127/71  GENERAL: alert, oriented, appears well and in no acute distress  HEENT: atraumatic, conjunttiva clear, no obvious abnormalities on inspection of external nose and ears  NECK: normal movements of the head and neck  LUNGS: on inspection no signs of respiratory distress, breathing rate appears normal, no obvious gross SOB, gasping or wheezing  CV: no obvious cyanosis  PSYCH/NEURO: pleasant and cooperative, no obvious depression or anxiety, speech and thought processing grossly  intact  ASSESSMENT AND PLAN:  Discussed the following assessment and plan:  Cervicalgia  Depression, unspecified depression type  Hypercholesterolemia  Hyperglycemia  Cervicalgia She is exercising.  Neck is doing better.  No pain.  Follow.   Depression Doing well on current regimen.  Follow.  Continue zoloft.    Hypercholesterolemia On simvastatin.  Low cholesterol diet and exercise.  Follow lipid panel and liver function tests.    Hyperglycemia Follow met b and a1c.  She is exercising.      I discussed the assessment and treatment plan with the patient. The patient was provided an opportunity to ask questions and all were answered. The patient agreed with the plan and demonstrated an understanding of the instructions.   The patient was advised to call back or seek an in-person evaluation if the symptoms worsen or if the condition fails to improve as anticipated.    Einar Pheasant, MD

## 2019-03-31 NOTE — Assessment & Plan Note (Signed)
Follow met b and a1c.  She is exercising.

## 2019-06-02 ENCOUNTER — Ambulatory Visit (INDEPENDENT_AMBULATORY_CARE_PROVIDER_SITE_OTHER): Payer: Medicare Other | Admitting: Internal Medicine

## 2019-06-02 ENCOUNTER — Encounter: Payer: Self-pay | Admitting: Internal Medicine

## 2019-06-02 ENCOUNTER — Other Ambulatory Visit: Payer: Self-pay

## 2019-06-02 VITALS — BP 120/72 | HR 69 | Temp 98.0°F | Resp 16 | Ht 64.0 in | Wt 140.4 lb

## 2019-06-02 DIAGNOSIS — Z0000 Encounter for general adult medical examination without abnormal findings: Secondary | ICD-10-CM | POA: Diagnosis not present

## 2019-06-02 DIAGNOSIS — R739 Hyperglycemia, unspecified: Secondary | ICD-10-CM

## 2019-06-02 DIAGNOSIS — G2581 Restless legs syndrome: Secondary | ICD-10-CM | POA: Diagnosis not present

## 2019-06-02 DIAGNOSIS — E7800 Pure hypercholesterolemia, unspecified: Secondary | ICD-10-CM

## 2019-06-02 DIAGNOSIS — F32A Depression, unspecified: Secondary | ICD-10-CM

## 2019-06-02 DIAGNOSIS — F329 Major depressive disorder, single episode, unspecified: Secondary | ICD-10-CM

## 2019-06-02 DIAGNOSIS — M542 Cervicalgia: Secondary | ICD-10-CM

## 2019-06-02 LAB — CBC WITH DIFFERENTIAL/PLATELET
Basophils Absolute: 0.1 10*3/uL (ref 0.0–0.1)
Basophils Relative: 1.4 % (ref 0.0–3.0)
Eosinophils Absolute: 0.2 10*3/uL (ref 0.0–0.7)
Eosinophils Relative: 4 % (ref 0.0–5.0)
HCT: 39.9 % (ref 36.0–46.0)
Hemoglobin: 13.3 g/dL (ref 12.0–15.0)
Lymphocytes Relative: 31.6 % (ref 12.0–46.0)
Lymphs Abs: 1.6 10*3/uL (ref 0.7–4.0)
MCHC: 33.4 g/dL (ref 30.0–36.0)
MCV: 90 fl (ref 78.0–100.0)
Monocytes Absolute: 0.5 10*3/uL (ref 0.1–1.0)
Monocytes Relative: 9.3 % (ref 3.0–12.0)
Neutro Abs: 2.6 10*3/uL (ref 1.4–7.7)
Neutrophils Relative %: 53.7 % (ref 43.0–77.0)
Platelets: 276 10*3/uL (ref 150.0–400.0)
RBC: 4.44 Mil/uL (ref 3.87–5.11)
RDW: 13.1 % (ref 11.5–15.5)
WBC: 4.9 10*3/uL (ref 4.0–10.5)

## 2019-06-02 LAB — LIPID PANEL
Cholesterol: 169 mg/dL (ref 0–200)
HDL: 57.5 mg/dL (ref >39.00)
LDL Cholesterol: 88 mg/dL (ref 0–99)
NonHDL: 111.66
Total CHOL/HDL Ratio: 3
Triglycerides: 117 mg/dL (ref 0.0–149.0)
VLDL: 23.4 mg/dL (ref 0.0–40.0)

## 2019-06-02 LAB — TSH: TSH: 1.02 u[IU]/mL (ref 0.35–4.50)

## 2019-06-02 LAB — BASIC METABOLIC PANEL
BUN: 15 mg/dL (ref 6–23)
CO2: 28 mEq/L (ref 19–32)
Calcium: 9.7 mg/dL (ref 8.4–10.5)
Chloride: 102 mEq/L (ref 96–112)
Creatinine, Ser: 0.7 mg/dL (ref 0.40–1.20)
GFR: 82.53 mL/min (ref >60.00)
Glucose, Bld: 101 mg/dL — ABNORMAL HIGH (ref 70–99)
Potassium: 4.3 mEq/L (ref 3.5–5.1)
Sodium: 137 mEq/L (ref 135–145)

## 2019-06-02 LAB — HEPATIC FUNCTION PANEL
ALT: 18 U/L (ref 0–35)
AST: 23 U/L (ref 0–37)
Albumin: 4.4 g/dL (ref 3.5–5.2)
Alkaline Phosphatase: 58 U/L (ref 39–117)
Bilirubin, Direct: 0.1 mg/dL (ref 0.0–0.3)
Total Bilirubin: 0.5 mg/dL (ref 0.2–1.2)
Total Protein: 6.3 g/dL (ref 6.0–8.3)

## 2019-06-02 LAB — IBC PANEL
Iron: 122 ug/dL (ref 42–145)
Saturation Ratios: 26 % (ref 20.0–50.0)
Transferrin: 335 mg/dL (ref 212.0–360.0)

## 2019-06-02 LAB — HEMOGLOBIN A1C: Hgb A1c MFr Bld: 6.1 % (ref 4.6–6.5)

## 2019-06-02 LAB — VITAMIN B12: Vitamin B-12: 286 pg/mL (ref 211–911)

## 2019-06-02 LAB — FERRITIN: Ferritin: 112.3 ng/mL (ref 10.0–291.0)

## 2019-06-02 NOTE — Assessment & Plan Note (Signed)
Low carb diet and exercise.  Follow met b and a1c.   

## 2019-06-02 NOTE — Assessment & Plan Note (Signed)
On simvastatin.  Low cholesterol diet and exercise.  Follow lipid panel and liver function tests.   

## 2019-06-02 NOTE — Assessment & Plan Note (Signed)
Physical today 06/02/19.  Mammogram scheduled for 06/10/19.  Colonoscopy 03/05/18 - entire colon is normal.  Non bleeding hemorrhoids.

## 2019-06-02 NOTE — Assessment & Plan Note (Signed)
Restless legs as outlined.  Check cbc, iron studies, etc.  Discussed treatment options.  She does not feel needs any further intervention at this time.  Follow.

## 2019-06-02 NOTE — Progress Notes (Signed)
Patient ID: ELVENIA GODDEN, female   DOB: 04/17/1946, 73 y.o.   MRN: [id number]   Subjective:    Patient ID: SHALIAH WANN, female    DOB: 1946-08-04, 73 y.o.   MRN: [id number]  HPI  Patient here for her physical exam.  She reports she is doing well.  Feels good.  Staying active.  Walking.  No chest pain.  No sob.  No acid reflux.  No abdominal pain.  Bowels moving.  No known covid exposure.  No fever.  No chest congestion or sob.  Doing well on zoloft.  Does report having issues with restless legs.  Occurs most nights.  When she has issues, she gets up and stretches, takes a tylenol and reads.  This regimen helps.  Discussed lab testing.  Also discussed other treatments.  She does not feel she needs any medication at this time.     Past Medical History:  Diagnosis Date  . Depression 2000  . Heart murmur   . Hypercholesterolemia Osteoporosis   Past Surgical History:  Procedure Laterality Date  . ABDOMINAL HYSTERECTOMY  1980  . COLONOSCOPY WITH PROPOFOL N/A 03/05/2018   Procedure: COLONOSCOPY WITH PROPOFOL;  Surgeon: Toledo, [name], MD;  Location: [facility] ENDOSCOPY;  Service: Endoscopy;  Laterality: N/A;   Family History  Problem Relation Age of Onset  . Heart disease Mother   . Diabetes Mother   . Heart disease Father   . Diabetes Father   . Colon cancer Other   . Congestive Heart Failure Brother   . Colon cancer Brother   . BRCA 1/2 Neg Hx   . Breast cancer Neg Hx   . Cowden syndrome Neg Hx   . DES usage Neg Hx   . Endometrial cancer Neg Hx   . Li-Fraumeni syndrome Neg Hx   . Ovarian cancer Neg Hx    Social History   Socioeconomic History  . Marital status: Married    Spouse name: Not on file  . Number of children: Not on file  . Years of education: Not on file  . Highest education level: Not on file  Occupational History  . Not on file  Social Needs  . Financial resource strain: Not hard at all  . Food insecurity    Worry: Never true    Inability: Never true  .  Transportation needs    Medical: No    Non-medical: No  Tobacco Use  . Smoking status: Never Smoker  . Smokeless tobacco: Never Used  Substance and Sexual Activity  . Alcohol use: Yes    Alcohol/week: 0.0 standard drinks    Comment: occasionally - socially during the summer a glass of wine or beer  . Drug use: No  . Sexual activity: Yes  Lifestyle  . Physical activity    Days per week: 5 days    Minutes per session: 30 min  . Stress: Not at all  Relationships  . Social Herbalist on phone: Not on file    Gets together: Not on file    Attends religious service: Not on file    Active member of club or organization: Not on file    Attends meetings of clubs or organizations: Not on file    Relationship status: Not on file  Other Topics Concern  . Not on file  Social History Narrative  . Not on file    Outpatient Encounter Medications as of 06/02/2019  Medication Sig  . aspirin EC 81  MG tablet Take 81 mg by mouth daily.  . calcium carbonate (OS-CAL) 600 MG TABS tablet Take 600 mg by mouth 2 (two) times daily with a meal.  . Calcium-Magnesium-Zinc 333-133-5 MG TABS Take by mouth.  . Cholecalciferol (VITAMIN D PO) Take 250 mg by mouth 2 (two) times daily.  . Misc Natural Products (GLUCOSAMINE CHONDROITIN VIT D3 PO) Take 1 capsule by mouth.  . sertraline (ZOLOFT) 50 MG tablet Take 1 tablet (50 mg total) by mouth daily.  . simvastatin (ZOCOR) 20 MG tablet Take 1 tablet (20 mg total) by mouth daily.   No facility-administered encounter medications on file as of 06/02/2019.     Review of Systems  Constitutional: Negative for appetite change and unexpected weight change.  HENT: Negative for congestion and sinus pressure.   Eyes: Negative for pain and visual disturbance.  Respiratory: Negative for cough, chest tightness and shortness of breath.   Cardiovascular: Negative for chest pain, palpitations and leg swelling.  Gastrointestinal: Negative for abdominal pain,  diarrhea, nausea and vomiting.  Genitourinary: Negative for difficulty urinating and dysuria.  Musculoskeletal: Negative for joint swelling and myalgias.  Skin: Negative for color change and rash.  Neurological: Negative for dizziness, light-headedness and headaches.  Hematological: Negative for adenopathy. Does not bruise/bleed easily.  Psychiatric/Behavioral: Negative for agitation and dysphoric mood.       Objective:    Physical Exam Constitutional:      General: She is not in acute distress.    Appearance: Normal appearance. She is well-developed.  HENT:     Right Ear: External ear normal. There is no impacted cerumen.     Left Ear: External ear normal. There is no impacted cerumen.  Eyes:     General: No scleral icterus.       Right eye: No discharge.        Left eye: No discharge.     Conjunctiva/sclera: Conjunctivae normal.  Neck:     Musculoskeletal: Neck supple. No muscular tenderness.     Thyroid: No thyromegaly.  Cardiovascular:     Rate and Rhythm: Normal rate and regular rhythm.  Pulmonary:     Effort: No tachypnea, accessory muscle usage or respiratory distress.     Breath sounds: Normal breath sounds. No decreased breath sounds or wheezing.  Chest:     Breasts:        Right: No inverted nipple, mass, nipple discharge or tenderness (no axillary adenopathy).        Left: No inverted nipple, mass, nipple discharge or tenderness (no axilarry adenopathy).  Abdominal:     General: Bowel sounds are normal.     Palpations: Abdomen is soft.     Tenderness: There is no abdominal tenderness.  Musculoskeletal:        General: No swelling or tenderness.  Lymphadenopathy:     Cervical: No cervical adenopathy.  Skin:    Findings: No erythema or rash.  Neurological:     Mental Status: She is alert and oriented to person, place, and time.  Psychiatric:        Mood and Affect: Mood normal.        Behavior: Behavior normal.    BP 120/72   Pulse 69   Temp 98 F (36.7  C) (Oral)   Resp 16   Ht 5' 4"  (1.626 m)   Wt 140 lb 6.4 oz (63.7 kg)   LMP 11/27/1979   SpO2 97%   BMI 24.10 kg/m  Wt Readings from Last 3 Encounters:  06/02/19 140 lb 6.4 oz (63.7 kg)  10/01/18 137 lb 12.8 oz (62.5 kg)  03/27/18 136 lb 9.6 oz (62 kg)     Lab Results  Component Value Date   WBC 4.9 06/02/2019   HGB 13.3 06/02/2019   HCT 39.9 06/02/2019   PLT 276.0 06/02/2019   GLUCOSE 101 (H) 06/02/2019   CHOL 169 06/02/2019   TRIG 117.0 06/02/2019   HDL 57.50 06/02/2019   LDLCALC 88 06/02/2019   ALT 18 06/02/2019   AST 23 06/02/2019   NA 137 06/02/2019   K 4.3 06/02/2019   CL 102 06/02/2019   CREATININE 0.70 06/02/2019   BUN 15 06/02/2019   CO2 28 06/02/2019   TSH 1.02 06/02/2019   HGBA1C 6.1 06/02/2019    Mm 3d Screen Breast Bilateral  Result Date: 04/22/2018 CLINICAL DATA:  Screening. EXAM: DIGITAL SCREENING BILATERAL MAMMOGRAM WITH TOMO AND CAD COMPARISON:  Previous exam(s). ACR Breast Density Category b: There are scattered areas of fibroglandular density. FINDINGS: There are no findings suspicious for malignancy. Images were processed with CAD. IMPRESSION: No mammographic evidence of malignancy. A result letter of this screening mammogram will be mailed directly to the patient. RECOMMENDATION: Screening mammogram in one year. (Code:SM-B-01Y) BI-RADS CATEGORY  1: Negative. Electronically Signed   By: Lajean Manes M.D.   On: 04/22/2018 12:08       Assessment & Plan:   Problem List Items Addressed This Visit    Cervicalgia    Neck is doing well.  Follow.        Depression    Doing well on current regimen.  Follow.  Continue zoloft.       Health care maintenance    Physical today 06/02/19.  Mammogram scheduled for 06/10/19.  Colonoscopy 03/05/18 - entire colon is normal.  Non bleeding hemorrhoids.        Hypercholesterolemia - Primary    On simvastatin.  Low cholesterol diet and exercise. Follow lipid panel and liver function tests.        Relevant  Orders   Hepatic function panel (Completed)   Lipid panel (Completed)   TSH (Completed)   Basic metabolic panel (Completed)   Hyperglycemia    Low carb diet and exercise.  Follow met b and a1c.        Relevant Orders   Hemoglobin A1c (Completed)   Restless leg syndrome    Restless legs as outlined.  Check cbc, iron studies, etc.  Discussed treatment options.  She does not feel needs any further intervention at this time.  Follow.        Relevant Orders   CBC with Differential/Platelet (Completed)   Vitamin B12 (Completed)   IBC panel (Completed)   Ferritin (Completed)       Einar Pheasant, MD

## 2019-06-02 NOTE — Assessment & Plan Note (Signed)
Neck is doing well.  Follow.

## 2019-06-02 NOTE — Assessment & Plan Note (Signed)
Doing well on current regimen.  Follow.  Continue zoloft.

## 2019-06-10 ENCOUNTER — Other Ambulatory Visit: Payer: Self-pay

## 2019-06-10 ENCOUNTER — Ambulatory Visit
Admission: RE | Admit: 2019-06-10 | Discharge: 2019-06-10 | Disposition: A | Discharge status: Home / Self Care | Payer: Medicare Other | Source: Ambulatory Visit | Attending: Internal Medicine | Admitting: Internal Medicine

## 2019-06-10 DIAGNOSIS — Z1231 Encounter for screening mammogram for malignant neoplasm of breast: Secondary | ICD-10-CM | POA: Insufficient documentation

## 2019-06-17 ENCOUNTER — Ambulatory Visit (INDEPENDENT_AMBULATORY_CARE_PROVIDER_SITE_OTHER): Payer: Medicare Other

## 2019-06-17 ENCOUNTER — Other Ambulatory Visit: Payer: Self-pay

## 2019-06-17 DIAGNOSIS — E538 Deficiency of other specified B group vitamins: Secondary | ICD-10-CM

## 2019-06-17 MED ORDER — CYANOCOBALAMIN 1000 MCG/ML IJ SOLN
1000.0000 ug | Freq: Once | INTRAMUSCULAR | Status: AC
  Administered 2019-06-17: 1000 ug via INTRAMUSCULAR

## 2019-06-17 NOTE — Progress Notes (Addendum)
Patient presented today for B12 injection per MD order (see lab result note on 06/03/19).  Administered IM in left deltoid.  Administered without incident.  Patient tolerated well with no signs of distress.  Reviewed.  Dr Nicki Reaper

## 2019-06-24 ENCOUNTER — Other Ambulatory Visit: Payer: Self-pay

## 2019-06-24 ENCOUNTER — Ambulatory Visit (INDEPENDENT_AMBULATORY_CARE_PROVIDER_SITE_OTHER): Payer: Medicare Other

## 2019-06-24 DIAGNOSIS — E538 Deficiency of other specified B group vitamins: Secondary | ICD-10-CM | POA: Diagnosis not present

## 2019-06-24 MED ORDER — CYANOCOBALAMIN 1000 MCG/ML IJ SOLN
1000.0000 ug | Freq: Once | INTRAMUSCULAR | Status: AC
  Administered 2019-06-24: 1000 ug via INTRAMUSCULAR

## 2019-06-24 NOTE — Progress Notes (Signed)
Patient presented today for B12 injection.  Administered IM in right deltoid.  Patient tolerated well with no signs of distress.   

## 2019-07-01 ENCOUNTER — Ambulatory Visit (INDEPENDENT_AMBULATORY_CARE_PROVIDER_SITE_OTHER): Payer: Medicare Other

## 2019-07-01 ENCOUNTER — Other Ambulatory Visit: Payer: Self-pay

## 2019-07-01 DIAGNOSIS — E538 Deficiency of other specified B group vitamins: Secondary | ICD-10-CM | POA: Diagnosis not present

## 2019-07-01 MED ORDER — CYANOCOBALAMIN 1000 MCG/ML IJ SOLN
1000.0000 ug | Freq: Once | INTRAMUSCULAR | Status: AC
  Administered 2019-07-01: 1000 ug via INTRAMUSCULAR

## 2019-07-01 NOTE — Progress Notes (Addendum)
Patient presented today for B12 injection.  Administered IM in left deltoid.  Patient tolerated well with no signs of distress.  Reviewed.  Dr Scott  

## 2019-07-08 ENCOUNTER — Other Ambulatory Visit: Payer: Self-pay

## 2019-07-08 ENCOUNTER — Other Ambulatory Visit: Payer: Self-pay | Admitting: Internal Medicine

## 2019-07-08 ENCOUNTER — Ambulatory Visit (INDEPENDENT_AMBULATORY_CARE_PROVIDER_SITE_OTHER): Payer: Medicare Other

## 2019-07-08 DIAGNOSIS — E538 Deficiency of other specified B group vitamins: Secondary | ICD-10-CM

## 2019-07-08 MED ORDER — CYANOCOBALAMIN 1000 MCG/ML IJ SOLN
1000.0000 ug | Freq: Once | INTRAMUSCULAR | Status: AC
  Administered 2019-07-08: 1000 ug via INTRAMUSCULAR

## 2019-07-08 NOTE — Progress Notes (Addendum)
Patient presented today for B12 injection.  Administered IM in right deltoid.  Patient tolerated well with no signs of distress.  Reviewed.  Dr Scott    

## 2019-07-30 ENCOUNTER — Encounter: Payer: Self-pay | Admitting: Internal Medicine

## 2019-07-30 ENCOUNTER — Telehealth: Payer: Self-pay

## 2019-07-30 ENCOUNTER — Other Ambulatory Visit: Payer: Self-pay

## 2019-07-30 DIAGNOSIS — Z20822 Contact with and (suspected) exposure to covid-19: Secondary | ICD-10-CM

## 2019-07-30 DIAGNOSIS — Z20828 Contact with and (suspected) exposure to other viral communicable diseases: Secondary | ICD-10-CM

## 2019-07-30 NOTE — Telephone Encounter (Signed)
Called and spoke with patient. She was at the beach this past weekend with her husband and family. Brothers significant other started having symptoms the day after they came home (Sunday or Monday). Patient is not having symptoms at this time. Advised that she will need to self quarantine, go to drive thru site to be tested, and do virtual visit with Dr Nicki Reaper to discuss quarantine, etc. Patient was agreeable. Scheduled for doxy visit tomorrow at 1:30. I have ordered the covid test and advised that she can go over to [facility] to be tested anytime before 3:30 today or after 8am tomorrow. This is an Pharmacist, hospital for you

## 2019-07-30 NOTE — Telephone Encounter (Signed)
See phone note for documentation.

## 2019-07-31 ENCOUNTER — Other Ambulatory Visit: Payer: Self-pay

## 2019-07-31 ENCOUNTER — Ambulatory Visit (INDEPENDENT_AMBULATORY_CARE_PROVIDER_SITE_OTHER): Payer: Medicare Other | Admitting: Internal Medicine

## 2019-07-31 DIAGNOSIS — Z20828 Contact with and (suspected) exposure to other viral communicable diseases: Secondary | ICD-10-CM

## 2019-07-31 DIAGNOSIS — Z20822 Contact with and (suspected) exposure to covid-19: Secondary | ICD-10-CM

## 2019-07-31 LAB — NOVEL CORONAVIRUS, NAA: SARS-CoV-2, NAA: NOT DETECTED

## 2019-07-31 NOTE — Progress Notes (Signed)
Patient ID: Angelica Robertson, female   DOB: [birth year], 73 y.o.   MRN: [id number]   Virtual Visit via video Note  This visit type was conducted due to national recommendations for restrictions regarding the COVID-19 pandemic (e.g. social distancing).  This format is felt to be most appropriate for this patient at this time.  All issues noted in this document were discussed and addressed.  No physical exam was performed (except for noted visual exam findings with Video Visits).   I connected with Josanna Hefel by a video enabled telemedicine application and verified that I am speaking with the correct person using two identifiers. Location patient: home Location provider: work  Persons participating in the virtual visit: patient, provider  I discussed the limitations, risks, security and privacy concerns of performing an evaluation and management service by video and the availability of in person appointments.  The patient expressed understanding and agreed to proceed.   Reason for visit: acute visit.    HPI: Work in appt due to possible covid exposure.  States went to the beach with four family members.  Was around them 07/25/19 - 07/27/19.  When they returned home, she was notified that one of her family members tested positive for covid. They did stay in the same house, but spent most of the day outside.  She is not having any fever.  No chest congestion or sob.  No nausea, vomiting or diarrhea. Wants to be tested.      ROS: See pertinent positives and negatives per HPI.  Past Medical History:  Diagnosis Date  . Depression 2000  . Heart murmur   . Hypercholesterolemia Osteoporosis    Past Surgical History:  Procedure Laterality Date  . ABDOMINAL HYSTERECTOMY  1980  . COLONOSCOPY WITH PROPOFOL N/A 03/05/2018   Procedure: COLONOSCOPY WITH PROPOFOL;  Surgeon: Toledo, [name], MD;  Location: [facility] END[facility]COPY;  Service: Endoscopy;  Laterality: N/A;    Family History  Problem Relation Age of  Onset  . Heart disease Mother   . Diabetes Mother   . Heart disease Father   . Diabetes Father   . Colon cancer Other   . Congestive Heart Failure Brother   . Colon cancer Brother   . BRCA 1/2 Neg Hx   . Breast cancer Neg Hx   . Cowden syndrome Neg Hx   . DES usage Neg Hx   . Endometrial cancer Neg Hx   . Li-Fraumeni syndrome Neg Hx   . Ovarian cancer Neg Hx     SOCIAL HX: reviewed.    Current Outpatient Medications:  .  aspirin EC 81 MG tablet, Take 81 mg by mouth daily., Disp: , Rfl:  .  calcium carbonate (OS-CAL) 600 MG TABS tablet, Take 600 mg by mouth 2 (two) times daily with a meal., Disp: , Rfl:  .  Calcium-Magnesium-Zinc 333-133-5 MG TABS, Take by mouth., Disp: , Rfl:  .  Cholecalciferol (VITAMIN D PO), Take 250 mg by mouth 2 (two) times daily., Disp: , Rfl:  .  Misc Natural Products (GLUCOSAMINE CHONDROITIN VIT D3 PO), Take 1 capsule by mouth., Disp: , Rfl:  .  sertraline (ZOLOFT) 50 MG tablet, TAKE 1 TABLET BY MOUTH ONCE DAILY, Disp: 90 tablet, Rfl: 1 .  simvastatin (ZOCOR) 20 MG tablet, TAKE 1 TABLET BY MOUTH ONCE DAILY, Disp: 90 tablet, Rfl: 1  EXAM:  GENERAL: alert, oriented, appears well and in no acute distress  HEENT: atraumatic, conjunttiva clear, no obvious abnormalities on inspection of external nose  and ears  NECK: normal movements of the head and neck  LUNGS: on inspection no signs of respiratory distress, breathing rate appears normal, no obvious gross SOB, gasping or wheezing  CV: no obvious cyanosis  PSYCH/NEURO: pleasant and cooperative, no obvious depression or anxiety, speech and thought processing grossly intact  ASSESSMENT AND PLAN:  Discussed the following assessment and plan:  Close Exposure to Covid-19 Virus Exposed to someone who tested positive as outlined.  Discussed with her today.  Wants tested.  Nasal swab ordered. Discussed self quarantine.  She has already quarantined herself.  Discussed symptoms to monitor.  Follow.      I  discussed the assessment and treatment plan with the patient. The patient was provided an opportunity to ask questions and all were answered. The patient agreed with the plan and demonstrated an understanding of the instructions.   The patient was advised to call back or seek an in-person evaluation if the symptoms worsen or if the condition fails to improve as anticipated.   Einar Pheasant, MD

## 2019-08-04 ENCOUNTER — Encounter: Payer: Self-pay | Admitting: Internal Medicine

## 2019-08-04 DIAGNOSIS — Z20828 Contact with and (suspected) exposure to other viral communicable diseases: Secondary | ICD-10-CM | POA: Insufficient documentation

## 2019-08-04 DIAGNOSIS — Z20822 Contact with and (suspected) exposure to covid-19: Secondary | ICD-10-CM | POA: Insufficient documentation

## 2019-08-04 NOTE — Assessment & Plan Note (Signed)
Exposed to someone who tested positive as outlined.  Discussed with her today.  Wants tested.  Nasal swab ordered. Discussed self quarantine.  She has already quarantined herself.  Discussed symptoms to monitor.  Follow.

## 2019-08-06 ENCOUNTER — Encounter: Payer: Self-pay | Admitting: Internal Medicine

## 2019-08-12 ENCOUNTER — Ambulatory Visit: Payer: Medicare Other

## 2019-09-02 ENCOUNTER — Ambulatory Visit (INDEPENDENT_AMBULATORY_CARE_PROVIDER_SITE_OTHER): Payer: Medicare Other | Admitting: *Deleted

## 2019-09-02 ENCOUNTER — Other Ambulatory Visit: Payer: Self-pay

## 2019-09-02 DIAGNOSIS — Z23 Encounter for immunization: Secondary | ICD-10-CM | POA: Diagnosis not present

## 2019-09-02 DIAGNOSIS — E538 Deficiency of other specified B group vitamins: Secondary | ICD-10-CM

## 2019-09-02 MED ORDER — CYANOCOBALAMIN 1000 MCG/ML IJ SOLN
1000.0000 ug | Freq: Once | INTRAMUSCULAR | Status: AC
  Administered 2019-09-02: 12:00:00 1000 ug via INTRAMUSCULAR

## 2019-09-02 NOTE — Progress Notes (Signed)
Patient presented for B 12 injection to left deltoid, patient voiced no concerns nor showed any signs of distress during injection. 

## 2019-10-07 ENCOUNTER — Other Ambulatory Visit: Payer: Self-pay

## 2019-10-07 ENCOUNTER — Ambulatory Visit (INDEPENDENT_AMBULATORY_CARE_PROVIDER_SITE_OTHER): Payer: Medicare Other | Admitting: *Deleted

## 2019-10-07 ENCOUNTER — Encounter: Payer: Self-pay | Admitting: Internal Medicine

## 2019-10-07 DIAGNOSIS — E538 Deficiency of other specified B group vitamins: Secondary | ICD-10-CM | POA: Diagnosis not present

## 2019-10-07 MED ORDER — SERTRALINE HCL 50 MG PO TABS: 50.0000 mg | ORAL_TABLET | Freq: Every day | ORAL | 1 refills | Status: DC

## 2019-10-07 MED ORDER — SIMVASTATIN 20 MG PO TABS: 20.0000 mg | ORAL_TABLET | Freq: Every day | ORAL | 1 refills | Status: DC

## 2019-10-07 MED ORDER — CYANOCOBALAMIN 1000 MCG/ML IJ SOLN
1000.0000 ug | Freq: Once | INTRAMUSCULAR | Status: AC
  Administered 2019-10-07: 11:00:00 1000 ug via INTRAMUSCULAR

## 2019-10-07 NOTE — Progress Notes (Addendum)
Patient presented for B 12 injection to right deltoid, patient voiced no concerns nor showed any signs of distress during injection.  Reviewed.  Dr Scott 

## 2019-10-21 ENCOUNTER — Ambulatory Visit (INDEPENDENT_AMBULATORY_CARE_PROVIDER_SITE_OTHER): Payer: Medicare Other | Admitting: Internal Medicine

## 2019-10-21 ENCOUNTER — Encounter: Payer: Self-pay | Admitting: Internal Medicine

## 2019-10-21 ENCOUNTER — Other Ambulatory Visit: Payer: Self-pay

## 2019-10-21 DIAGNOSIS — F32A Depression, unspecified: Secondary | ICD-10-CM

## 2019-10-21 DIAGNOSIS — F329 Major depressive disorder, single episode, unspecified: Secondary | ICD-10-CM

## 2019-10-21 DIAGNOSIS — M542 Cervicalgia: Secondary | ICD-10-CM | POA: Diagnosis not present

## 2019-10-21 MED ORDER — SERTRALINE HCL 100 MG PO TABS: 100.0000 mg | ORAL_TABLET | Freq: Every day | ORAL | 2 refills | Status: DC

## 2019-10-21 NOTE — Progress Notes (Signed)
Patient ID: Angelica Robertson, female   DOB: 28-Feb-1946, 73 y.o.   MRN: [id number]   Subjective:    Patient ID: Angelica Robertson, female    DOB: 03-11-1946, 73 y.o.   MRN: [id number]  HPI  Patient here as a work in appt.  Reports increased neck pain. Has a history of neck pain - cervical spondylosis and DDD.  Has worsened over the last several weeks.  Also reports increased depression.  Discussed with her today.  On zoloft.  Feels may need to increase the dose.  No suicidal ideations.  Has a history of depression.  Taking tylenol arthritis.  No chest pain.  No sob.  No acid reflux.  No abdominal pain.  Bowels moving.     Past Medical History:  Diagnosis Date  . Depression 2000  . Heart murmur   . Hypercholesterolemia Osteoporosis   Past Surgical History:  Procedure Laterality Date  . ABDOMINAL HYSTERECTOMY  1980  . COLONOSCOPY WITH PROPOFOL N/A 03/05/2018   Procedure: COLONOSCOPY WITH PROPOFOL;  Surgeon: Toledo, [name], MD;  Location: [facility] ENDOSCOPY;  Service: Endoscopy;  Laterality: N/A;   Family History  Problem Relation Age of Onset  . Heart disease Mother   . Diabetes Mother   . Heart disease Father   . Diabetes Father   . Colon cancer Other   . Congestive Heart Failure Brother   . Colon cancer Brother   . BRCA 1/2 Neg Hx   . Breast cancer Neg Hx   . Cowden syndrome Neg Hx   . DES usage Neg Hx   . Endometrial cancer Neg Hx   . Li-Fraumeni syndrome Neg Hx   . Ovarian cancer Neg Hx    Social History   Socioeconomic History  . Marital status: Married    Spouse name: Not on file  . Number of children: Not on file  . Years of education: Not on file  . Highest education level: Not on file  Occupational History  . Not on file  Social Needs  . Financial resource strain: Not hard at all  . Food insecurity    Worry: Never true    Inability: Never true  . Transportation needs    Medical: No    Non-medical: No  Tobacco Use  . Smoking status: Never Smoker  . Smokeless  tobacco: Never Used  Substance and Sexual Activity  . Alcohol use: Yes    Alcohol/week: 0.0 standard drinks    Comment: occasionally - socially during the summer a glass of wine or beer  . Drug use: No  . Sexual activity: Yes  Lifestyle  . Physical activity    Days per week: 5 days    Minutes per session: 30 min  . Stress: Not at all  Relationships  . Social Herbalist on phone: Not on file    Gets together: Not on file    Attends religious service: Not on file    Active member of club or organization: Not on file    Attends meetings of clubs or organizations: Not on file    Relationship status: Not on file  Other Topics Concern  . Not on file  Social History Narrative  . Not on file    Outpatient Encounter Medications as of 10/21/2019  Medication Sig  . aspirin EC 81 MG tablet Take 81 mg by mouth daily.  . calcium carbonate (OS-CAL) 600 MG TABS tablet Take 600 mg by mouth 2 (two) times daily  with a meal.  . Calcium-Magnesium-Zinc 333-133-5 MG TABS Take by mouth.  . Cholecalciferol (VITAMIN D PO) Take 250 mg by mouth 2 (two) times daily.  . Misc Natural Products (GLUCOSAMINE CHONDROITIN VIT D3 PO) Take 1 capsule by mouth.  . sertraline (ZOLOFT) 100 MG tablet Take 1 tablet (100 mg total) by mouth daily.  . simvastatin (ZOCOR) 20 MG tablet Take 1 tablet (20 mg total) by mouth daily.  . [DISCONTINUED] sertraline (ZOLOFT) 50 MG tablet Take 1 tablet (50 mg total) by mouth daily.   No facility-administered encounter medications on file as of 10/21/2019.    Review of Systems  Constitutional: Negative for appetite change and unexpected weight change.  HENT: Negative for congestion and sinus pressure.   Respiratory: Negative for cough, chest tightness and shortness of breath.   Cardiovascular: Negative for chest pain, palpitations and leg swelling.  Gastrointestinal: Negative for abdominal pain, diarrhea, nausea and vomiting.  Genitourinary: Negative for difficulty  urinating and dysuria.  Musculoskeletal: Positive for neck pain. Negative for joint swelling and myalgias.  Skin: Negative for color change and rash.  Neurological: Negative for dizziness, light-headedness and headaches.  Psychiatric/Behavioral: Negative for agitation and dysphoric mood.       Objective:    Physical Exam Constitutional:      General: She is not in acute distress.    Appearance: Normal appearance.  HENT:     Head: Normocephalic and atraumatic.     Right Ear: External ear normal.     Left Ear: External ear normal.  Eyes:     General: No scleral icterus.       Right eye: No discharge.        Left eye: No discharge.     Conjunctiva/sclera: Conjunctivae normal.  Neck:     Musculoskeletal: Neck supple.     Thyroid: No thyromegaly.     Comments: Some increased pain with rotation of her head from left to right.  Cardiovascular:     Rate and Rhythm: Normal rate and regular rhythm.  Pulmonary:     Effort: No respiratory distress.     Breath sounds: Normal breath sounds. No wheezing.  Abdominal:     General: Bowel sounds are normal.     Palpations: Abdomen is soft.     Tenderness: There is no abdominal tenderness.  Musculoskeletal:        General: No swelling or tenderness.  Lymphadenopathy:     Cervical: No cervical adenopathy.  Skin:    Findings: No erythema or rash.  Neurological:     Mental Status: She is alert.  Psychiatric:        Mood and Affect: Mood normal.        Behavior: Behavior normal.     BP (!) 160/82   Pulse 86   Temp 98.4 F (36.9 C)   Resp 16   Wt 139 lb 9.6 oz (63.3 kg)   LMP 11/27/1979   SpO2 97%   BMI 23.96 kg/m  Wt Readings from Last 3 Encounters:  10/21/19 139 lb 9.6 oz (63.3 kg)  06/02/19 140 lb 6.4 oz (63.7 kg)  10/01/18 137 lb 12.8 oz (62.5 kg)     Lab Results  Component Value Date   WBC 4.9 06/02/2019   HGB 13.3 06/02/2019   HCT 39.9 06/02/2019   PLT 276.0 06/02/2019   GLUCOSE 101 (H) 06/02/2019   CHOL 169  06/02/2019   TRIG 117.0 06/02/2019   HDL 57.50 06/02/2019   LDLCALC 88 06/02/2019  ALT 18 06/02/2019   AST 23 06/02/2019   NA 137 06/02/2019   K 4.3 06/02/2019   CL 102 06/02/2019   CREATININE 0.70 06/02/2019   BUN 15 06/02/2019   CO2 28 06/02/2019   TSH 1.02 06/02/2019   HGBA1C 6.1 06/02/2019    Mm 3d Screen Breast Bilateral  Result Date: 06/11/2019 CLINICAL DATA:  Screening. EXAM: DIGITAL SCREENING BILATERAL MAMMOGRAM WITH TOMO AND CAD COMPARISON:  Previous exam(s). ACR Breast Density Category b: There are scattered areas of fibroglandular density. FINDINGS: There are no findings suspicious for malignancy. Images were processed with CAD. IMPRESSION: No mammographic evidence of malignancy. A result letter of this screening mammogram will be mailed directly to the patient. RECOMMENDATION: Screening mammogram in one year. (Code:SM-B-01Y) BI-RADS CATEGORY  1: Negative. Electronically Signed   By: Margarette Canada M.D.   On: 06/11/2019 10:09       Assessment & Plan:   Problem List Items Addressed This Visit    Depression    Increased depression as outlined.  No suicidal ideations.  Discussed counseling.  She wants to hold at this time.  Increase zoloft to 144m q day.  Get her back in soon to reassess.        Relevant Medications   sertraline (ZOLOFT) 100 MG tablet   Neck pain    Has seen [facility] previously.  Increased neck pain as outlined.  Tylenol arthritis.  MRI with cervical spondylosis and DDD.  Given increased pain, request referral back to Dr YIzora Ribas       Relevant Orders   Ambulatory referral to Neurosurgery       CEinar Pheasant MD

## 2019-10-21 NOTE — Patient Instructions (Signed)
Melatonin 3mg - take one tablet 1-2 hours before bed 

## 2019-10-25 ENCOUNTER — Encounter: Payer: Self-pay | Admitting: Internal Medicine

## 2019-10-25 NOTE — Assessment & Plan Note (Signed)
Has seen [facility] previously.  Increased neck pain as outlined.  Tylenol arthritis.  MRI with cervical spondylosis and DDD.  Given increased pain, request referral back to Dr Izora Ribas.

## 2019-10-25 NOTE — Assessment & Plan Note (Signed)
Increased depression as outlined.  No suicidal ideations.  Discussed counseling.  She wants to hold at this time.  Increase zoloft to 100mg  q day.  Get her back in soon to reassess.

## 2019-10-30 ENCOUNTER — Encounter: Payer: Self-pay | Admitting: Internal Medicine

## 2019-10-30 DIAGNOSIS — F329 Major depressive disorder, single episode, unspecified: Secondary | ICD-10-CM

## 2019-10-30 DIAGNOSIS — F32A Depression, unspecified: Secondary | ICD-10-CM

## 2019-11-01 NOTE — Telephone Encounter (Signed)
My chart message sent to pt.

## 2019-11-03 NOTE — Telephone Encounter (Signed)
See other note

## 2019-11-13 ENCOUNTER — Ambulatory Visit (INDEPENDENT_AMBULATORY_CARE_PROVIDER_SITE_OTHER): Payer: Medicare Other

## 2019-11-13 ENCOUNTER — Encounter: Payer: Self-pay | Admitting: Psychiatry

## 2019-11-13 ENCOUNTER — Other Ambulatory Visit: Payer: Self-pay

## 2019-11-13 ENCOUNTER — Ambulatory Visit (INDEPENDENT_AMBULATORY_CARE_PROVIDER_SITE_OTHER): Payer: Medicare Other | Admitting: Psychiatry

## 2019-11-13 VITALS — Temp 98.1°F

## 2019-11-13 DIAGNOSIS — E538 Deficiency of other specified B group vitamins: Secondary | ICD-10-CM | POA: Diagnosis not present

## 2019-11-13 DIAGNOSIS — F3341 Major depressive disorder, recurrent, in partial remission: Secondary | ICD-10-CM | POA: Diagnosis not present

## 2019-11-13 MED ORDER — CYANOCOBALAMIN 1000 MCG/ML IJ SOLN
1000.0000 ug | Freq: Once | INTRAMUSCULAR | Status: AC
  Administered 2019-11-13: 1000 ug via INTRAMUSCULAR

## 2019-11-13 NOTE — Progress Notes (Signed)
Psychiatric Initial Adult Assessment    I connected with  FAATIMA TENCH on 11/13/19 by a video enabled telemedicine application and verified that I am speaking with the correct person using two identifiers.   I discussed the limitations of evaluation and management by telemedicine. The patient expressed understanding and agreed to proceed.   Patient Identification: Angelica Robertson MRN:  [id number] Date of Evaluation:  11/13/2019   Referral Source: PCP Dr. Nicki Reaper  Chief Complaint:   Chief Complaint    Establish Care; Depression     Visit Diagnosis:    ICD-10-CM   1. Recurrent major depressive disorder, in partial remission ([place])  F33.41     History of Present Illness: This is a 73 year old female with history of depression and to past psychiatry hospitalizations now seen for evaluation.  Patient stated that a few weeks ago she fell while taking a walk and as result the pain in her neck and upper back has returned which has made her limited.  Soon after that she realized her depressive symptoms were no longer controlled with sertraline 50 mg dose.  She spoke with her PCP who increased her dose to 100 mg however a few days after taking 100 mg dose she noticed that she felt more nervous and jittery and also could not sleep.  At that point her PCP reduced her dose of sertraline to 75 mg.  Patient stated that she is continuing to eat healthy, drink plenty of water and also exercising daily.  She is trying to take walks in the mall on days when she cannot go to the park due to cold weather.  She also reported she is checking her blood pressure twice a day.  She is worried that her blood pressure values have been high recently.  She stated that couple of days ago she felt she was back to being herself but then her feelings of sadness came back.  She is pushing through them by maintaining her daily routine.  She stated that she is fearful that if her depressive symptoms come back she may have to go back to  the hospital.  She informed that she had pretty severe depression episodes in the past at  2 different times which ended up in psychiatry hospitalizations.  She denied any suicidal ideations or intent.  She stated that she is going to the beach with her husband to spend the rest of the week and the weekend.  She intends to focus on relaxing and exercising. She has recently started physical therapy sessions and is hoping that it will continue to help her as it was beneficial in the past.  Patient denies any hypomanic or manic symptoms.  She denies any psychotic symptoms.   Past Psychiatric History: Depression, 2 psychiatric hospitalizations, was seeing Dr. [name] in the past.  Previous Psychotropic Medications: Yes   Substance Abuse History in the last 12 months:  No.  Consequences of Substance Abuse: Negative  Past Medical History:  Past Medical History:  Diagnosis Date  . Depression 2000  . Heart murmur   . Hypercholesterolemia Osteoporosis    Past Surgical History:  Procedure Laterality Date  . ABDOMINAL HYSTERECTOMY  1980  . COLONOSCOPY WITH PROPOFOL N/A 03/05/2018   Procedure: COLONOSCOPY WITH PROPOFOL;  Surgeon: Toledo, [name], MD;  Location: [facility] ENDOSCOPY;  Service: Endoscopy;  Laterality: N/A;    Family Psychiatric History: denied  Family History:  Family History  Problem Relation Age of Onset  . Heart disease Mother   .  Diabetes Mother   . Heart disease Father   . Diabetes Father   . Colon cancer Other   . Congestive Heart Failure Brother   . Colon cancer Brother   . BRCA 1/2 Neg Hx   . Breast cancer Neg Hx   . Cowden syndrome Neg Hx   . DES usage Neg Hx   . Endometrial cancer Neg Hx   . Li-Fraumeni syndrome Neg Hx   . Ovarian cancer Neg Hx     Social History:   Social History   Socioeconomic History  . Marital status: Married    Spouse name: robert  . Number of children: 2  . Years of education: Not on file  . Highest education level: Not on file   Occupational History  . Not on file  Social Needs  . Financial resource strain: Not hard at all  . Food insecurity    Worry: Never true    Inability: Never true  . Transportation needs    Medical: No    Non-medical: No  Tobacco Use  . Smoking status: Never Smoker  . Smokeless tobacco: Never Used  Substance and Sexual Activity  . Alcohol use: Yes    Alcohol/week: 0.0 standard drinks    Comment: occasionally - socially during the summer a glass of wine or beer  . Drug use: No  . Sexual activity: Yes  Lifestyle  . Physical activity    Days per week: 5 days    Minutes per session: 30 min  . Stress: To some extent  Relationships  . Social Herbalist on phone: Not on file    Gets together: Not on file    Attends religious service: Not on file    Active member of club or organization: Not on file    Attends meetings of clubs or organizations: Not on file    Relationship status: Married  Other Topics Concern  . Not on file  Social History Narrative  . Not on file    Additional Social History: Lives with husband, worked for [employer] for 30 yrs, had a Conservation officer, nature job  Allergies:  No Known Allergies  Metabolic Disorder Labs: Lab Results  Component Value Date   HGBA1C 6.1 06/02/2019   No results found for: PROLACTIN Lab Results  Component Value Date   CHOL 169 06/02/2019   TRIG 117.0 06/02/2019   HDL 57.50 06/02/2019   CHOLHDL 3 06/02/2019   VLDL 23.4 06/02/2019   [place] 88 06/02/2019   LDLCALC 74 10/01/2018   Lab Results  Component Value Date   TSH 1.02 06/02/2019    Therapeutic Level Labs: No results found for: LITHIUM No results found for: CBMZ No results found for: VALPROATE  Current Medications: Current Outpatient Medications  Medication Sig Dispense Refill  . aspirin EC 81 MG tablet Take 81 mg by mouth daily.    . calcium carbonate (OS-CAL) 600 MG TABS tablet Take 600 mg by mouth 2 (two) times daily with a meal.    . Calcium-Magnesium-Zinc  333-133-5 MG TABS Take by mouth.    . Cholecalciferol (VITAMIN D PO) Take 250 mg by mouth 2 (two) times daily.    Marland Kitchen MELATONIN PO Take by mouth.    . Misc Natural Products (GLUCOSAMINE CHONDROITIN VIT D3 PO) Take 1 capsule by mouth.    . sertraline (ZOLOFT) 100 MG tablet Take 1 tablet (100 mg total) by mouth daily. 30 tablet 2  . simvastatin (ZOCOR) 20 MG tablet Take 1  tablet (20 mg total) by mouth daily. 90 tablet 1   No current facility-administered medications for this visit.     Musculoskeletal: Strength & Muscle Tone: unable to assess due to telemed visit Gait & Station: unable to assess due to telemed visit Patient leans: unable to assess due to telemed visit   Psychiatric Specialty Exam: ROS  Last menstrual period 11/27/1979.There is no height or weight on file to calculate BMI.  General Appearance: Well Groomed  Eye Contact:  Good  Speech:  Clear and Coherent and Normal Rate  Volume:  Normal  Mood:  Slightly depressed  Affect:  Congruent  Thought Process:  Goal Directed, Linear and Descriptions of Associations: Intact  Orientation:  Full (Time, Place, and Person)  Thought Content:  Logical  Suicidal Thoughts:  No  Homicidal Thoughts:  No  Memory:  Recent;   Good Remote;   Good  Judgement:  Fair  Insight:  Fair  Psychomotor Activity:  Normal  Concentration:  Concentration: Good and Attention Span: Good  Recall:  Good  Fund of Knowledge:Good  Language: Good  Akathisia:  Negative  Handed:  Right  AIMS (if indicated):  not done  Assets:  Communication Skills Desire for Improvement Financial Resources/Insurance Housing Social Support Talents/Skills  ADL's:  Intact  Cognition: WNL  Sleep:  Fair   Screenings: AUDIT     Admission (Discharged) from OP Visit from 12/09/2012 in [place] [facility]  Alcohol Use Disorder Identification Test Final Score (AUDIT)  0    Mini-Mental     Clinical Support from 02/27/2018 in [place] from 02/02/2017 in [place] from 02/03/2016 in [facility]  Total Score (max 30 points )  _0 PHQ2-9     Office Visit from 10/21/2019 in [facility] Office Visit from 06/02/2019 in [place] from 03/11/2019 in [place] from 02/27/2018 in [place] from 02/02/2017 in [place]  PHQ-2 Total Score  6  0  0  0  0  PHQ-9 Total Score  17  0  -  -  -      Assessment and Plan: 73 year old female with history of depression now seen for ongoing depressive symptoms which seem to be improving gradually.  She stated that she is worried for her symptoms to get worse as in the past she has needed to be hospitalized twice and she does not want that again.  She was recommended to continue sertraline 75 mg for now and will follow up in 6 weeks.  1. Recurrent major depressive disorder, in partial remission ([facility]) - Continue Sertraline 75 mg daily  F/up in 6 weeks.  [place] Crane, MD 12/3/20202:28 PM

## 2019-11-13 NOTE — Progress Notes (Addendum)
Angelica Robertson presents today for injection per MD orders. B12 injection  administered IM in left Upper Arm. Administration without incident. Patient tolerated well.  Angelica Robertson,cma   Reviewed.  Dr Nicki Reaper

## 2019-11-18 ENCOUNTER — Other Ambulatory Visit: Payer: Self-pay

## 2019-11-18 ENCOUNTER — Ambulatory Visit (INDEPENDENT_AMBULATORY_CARE_PROVIDER_SITE_OTHER): Payer: Medicare Other | Admitting: Internal Medicine

## 2019-11-18 ENCOUNTER — Encounter: Payer: Self-pay | Admitting: Internal Medicine

## 2019-11-18 DIAGNOSIS — F3341 Major depressive disorder, recurrent, in partial remission: Secondary | ICD-10-CM | POA: Diagnosis not present

## 2019-11-18 DIAGNOSIS — M542 Cervicalgia: Secondary | ICD-10-CM

## 2019-11-18 NOTE — Progress Notes (Signed)
Patient ID: Angelica Robertson, female   DOB: Mar 16, 1946, 73 y.o.   MRN: [id number]   Virtual Visit via video Note  This visit type was conducted due to national recommendations for restrictions regarding the COVID-19 pandemic (e.g. social distancing).  This format is felt to be most appropriate for this patient at this time.  All issues noted in this document were discussed and addressed.  No physical exam was performed (except for noted visual exam findings with Video Visits).   I connected with Ysabelle Goodroe by a video enabled telemedicine application and verified that I am speaking with the correct person using two identifiers. Location patient: home Location provider: work Persons participating in the virtual visit: patient, provider  I discussed the limitations, risks, security and privacy concerns of performing an evaluation and management service by video and the availability of in person appointments.  The patient expressed understanding and agreed to proceed.   Reason for visit: scheduled follow up.   HPI: Problems with increased stress/depression.  Started on zoloft last visit.  We increased dose to 165m.  Did not tolerate.  Taking 1 1/2 tablet per day now.  Saw Dr KToy Care  Recommended continuing lower dose of zoloft.  Is planning to see C[facility] Floodtomorrow.  With increased neck pain.  Saw neurosurgery.  Referred to Dr CSharlet [place]  Recommended physical therapy.  Planning traction.  Blood pressure - 136/70-80s.  Sleeping 4-5 hours per night.  No report of chest pain.  Breathing stable.  Eating.  Previous constipation.    ROS: See pertinent positives and negatives per HPI.  Past Medical History:  Diagnosis Date  . Depression 2000  . Heart murmur   . Hypercholesterolemia Osteoporosis    Past Surgical History:  Procedure Laterality Date  . ABDOMINAL HYSTERECTOMY  1980  . COLONOSCOPY WITH PROPOFOL N/A 03/05/2018   Procedure: COLONOSCOPY WITH PROPOFOL;  Surgeon: Toledo, TBenay Pike MD;   Location: [facility] ENDOSCOPY;  Service: Endoscopy;  Laterality: N/A;    Family History  Problem Relation Age of Onset  . Heart disease Mother   . Diabetes Mother   . Heart disease Father   . Diabetes Father   . Colon cancer Other   . Congestive Heart Failure Brother   . Colon cancer Brother   . BRCA 1/2 Neg Hx   . Breast cancer Neg Hx   . Cowden syndrome Neg Hx   . DES usage Neg Hx   . Endometrial cancer Neg Hx   . Li-Fraumeni syndrome Neg Hx   . Ovarian cancer Neg Hx     SOCIAL HX: reviewed.    Current Outpatient Medications:  .  aspirin EC 81 MG tablet, Take 81 mg by mouth daily., Disp: , Rfl:  .  calcium carbonate (OS-CAL) 600 MG TABS tablet, Take 600 mg by mouth 2 (two) times daily with a meal., Disp: , Rfl:  .  Calcium-Magnesium-Zinc 333-133-5 MG TABS, Take by mouth., Disp: , Rfl:  .  Cholecalciferol (VITAMIN D PO), Take 250 mg by mouth 2 (two) times daily., Disp: , Rfl:  .  MELATONIN PO, Take by mouth., Disp: , Rfl:  .  Misc Natural Products (GLUCOSAMINE CHONDROITIN VIT D3 PO), Take 1 capsule by mouth., Disp: , Rfl:  .  sertraline (ZOLOFT) 100 MG tablet, Take 1 tablet (100 mg total) by mouth daily., Disp: 30 tablet, Rfl: 2 .  simvastatin (ZOCOR) 20 MG tablet, Take 1 tablet (20 mg total) by mouth daily., Disp: 90 tablet, Rfl: 1  EXAM:  VITALS per patient if applicable: 809 lbs.   GENERAL: alert, oriented, appears well and in no acute distress  HEENT: atraumatic, conjunttiva clear, no obvious abnormalities on inspection of external nose and ears  NECK: normal movements of the head and neck  LUNGS: on inspection no signs of respiratory distress, breathing rate appears normal, no obvious gross SOB, gasping or wheezing  CV: no obvious cyanosis  PSYCH/NEURO: pleasant and cooperative, no obvious depression or anxiety, speech and thought processing grossly intact  ASSESSMENT AND PLAN:  Discussed the following assessment and plan:  Depression Discussed with her today.   Saw Dr [name].  Planning to see [facility] bates tomorrow.  On zoloft.  Will continue for now.  Treat neck.  D/w psychiatry regarding further treatment.    Neck pain Saw neurosurgery.  Referred to Dr Sharlet [place].  Recommended physical therapy - traction.      I discussed the assessment and treatment plan with the patient. The patient was provided an opportunity to ask questions and all were answered. The patient agreed with the plan and demonstrated an understanding of the instructions.   The patient was advised to call back or seek an in-person evaluation if the symptoms worsen or if the condition fails to improve as anticipated.   Einar Pheasant, MD

## 2019-11-19 ENCOUNTER — Ambulatory Visit (INDEPENDENT_AMBULATORY_CARE_PROVIDER_SITE_OTHER): Payer: Medicare Other | Admitting: Licensed Clinical Social Worker

## 2019-11-19 DIAGNOSIS — F3341 Major depressive disorder, recurrent, in partial remission: Secondary | ICD-10-CM
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
## 2019-11-22 ENCOUNTER — Encounter: Payer: Self-pay | Admitting: Internal Medicine

## 2019-11-22 NOTE — Assessment & Plan Note (Signed)
Saw neurosurgery.  Referred to Dr Sharlet Salina.  Recommended physical therapy - traction.

## 2019-11-22 NOTE — Assessment & Plan Note (Signed)
Discussed with her today.  Saw Dr [name].  Planning to see [facility] bates tomorrow.  On zoloft.  Will continue for now.  Treat neck.  D/w psychiatry regarding further treatment.

## 2019-11-23 ENCOUNTER — Encounter: Payer: Self-pay | Admitting: Internal Medicine

## 2019-11-24 ENCOUNTER — Telehealth: Payer: Self-pay | Admitting: Internal Medicine

## 2019-11-24 NOTE — Telephone Encounter (Signed)
-----   Message from Nevada Crane, MD sent at 11/24/2019  8:39 AM EST ----- Regarding: RE: follow up Hi, I will have her see the therapist this week  on 12/16 first and then will see if she needs immediate intervention by me followed by that visit.  Thanks, Nevada Crane ----- Message ----- From: Einar Pheasant, MD Sent: 11/22/2019  10:19 PM EST To: Nevada Crane, MD Subject: follow up                                      I saw this patient in follow up.  I had previously started her on zoloft.  Because of persistent problems, I increased her zoloft to 100mg  per day. She did not tolerate this dose.  She is currently on 75mg  q day.  She saw Shade Flood recently.  She is still having issues with depression.  Informed her I would make you aware of her persistent problems.  She does not have f/u with you until 12/28/18.  I appreciate your help taking care of her.    Thank you.  Einar Pheasant

## 2019-11-26 ENCOUNTER — Ambulatory Visit (INDEPENDENT_AMBULATORY_CARE_PROVIDER_SITE_OTHER): Payer: Medicare Other | Admitting: Licensed Clinical Social Worker

## 2019-11-26 ENCOUNTER — Other Ambulatory Visit: Payer: Self-pay

## 2019-11-26 DIAGNOSIS — F3341 Major depressive disorder, recurrent, in partial remission: Secondary | ICD-10-CM | POA: Diagnosis not present

## 2019-11-26 NOTE — Progress Notes (Signed)
Virtual Visit via Video Note   I connected with Angelica Robertson on 11/26/19 at 3:00pm by [employer] video meeting and verified that I am speaking with the correct person using two identifiers.   I discussed the limitations, risks, security and privacy concerns of performing an evaluation and management service by telephone and the availability of in person appointments. I also discussed with the patient that there may be a patient responsible charge related to this service. The patient expressed understanding and agreed to proceed.   I discussed the assessment and treatment plan with the patient. The patient was provided an opportunity to ask questions and all were answered. The patient agreed with the plan and demonstrated an understanding of the instructions.   The patient was advised to call back or seek an in-person evaluation if the symptoms worsen or if the condition fails to improve as anticipated.   I provided 30 minutes of non-face-to-face time during this encounter.     Shade Flood, LCSW, LCASA _______________________ THERAPIST PROGRESS NOTE  Session Time: 3:00pm - 3:30pm  Participation Level: Active   Behavioral Response: Alert, neatly groomed, casually dressed, depressed mood  Type of Therapy:  Individual Therapy  Treatment Goals addressed: Depression and anxiety management; keeping medical appointments   Interventions: CBT  Summary: Angelica Robertson presented for virtual session today on time, alert, oriented x5, with no evidence or self-report of SI/HI or A/V H.  She reported emotional ratings of 6/10 for depression and 8/10 for anxiety and stated "I'm still not feeling like myself.  I have ups and downs through the week".  Angelica Robertson reported that she has made progress in keeping up with medications daily, attending medical appointments, going to [employer] and walking for 30 minutes yesterday, keeping a daily routine, as well as trying to find healthy ways to alleviate her neck pain,  such as putting an ice compress pack on it.  Angelica Robertson reported that she struggles with negative thinking frequently on days when pain is greater, and was able to think of more positive thoughts to replace these with such as "This is eventually going to get better" and "I have gotten through depression before".  Angelica Robertson participated in mindful breathing and visualization activity and reported that she had never done an exercise like this before, but was able to think back to a trip to [place] with her husband, and noted that her headache seemed better afterward.  Angelica Robertson reported that she has a followup appointment next week and will practice relaxation and thought replacement strategies in meantime to gauge effectiveness.      Suicidal/Homicidal: None, without plan or intent   Therapist Response: Clinician met with Angelica Robertson for virtual session on Webex application today.  Clinician assessed for safety.  Clinician inquired about Angelica Robertson's present emotional ratings, as well as any significant changes in thoughts, feelings, and behaviors since last conversation.  Clinician explained concept of negative thinking to Angelica Robertson, as well as how this can impact mood and pain depending on the thoughts one presently has.  Clinician assisted Angelica Robertson identifying positive thoughts and affirmations to counter negative thinking with on difficult days.  Clinician also presented relaxation breathing exercise to Angelica Robertson to help her regulate anxiety, and paired this with a guided visualization exercise involving an imaginary a trip to the beach to lift her mood and engage senses. Clinician gauged effectiveness of this exercise with Angelica Robertson afterward and offered suggestions for how to improve it on her own at home, including using [employer] of beach  sounds to enhance realism.  Clinician will continue to monitor.      Plan: Meet again in 1 week virtually.   Diagnosis: Recurrent major depressive disorder, in partial remission   Shade Flood, [place], [place] 11/26/19

## 2019-12-01 ENCOUNTER — Telehealth: Payer: Self-pay | Admitting: Psychiatry

## 2019-12-01 NOTE — Telephone Encounter (Signed)
Returned call to patient since Probation officer received a message from [place] at front desk that patient needed a call back regarding sleep problems.  Patient today reports that her depression is at a better place on the current dosage of Zoloft 75 mg.  She does feel sad about the holidays however she has been coping okay.  Patient however reports she has been struggling with sleep.  She did try melatonin couple of days in the past however it was not just melatonin it was a combination of melatonin with something else.  She reports it may have made her nauseous and she stopped taking it.  Discussed sleep hygiene techniques.  Discussed with patient to start melatonin 2 mg over-the-counter and to gradually increase it to a 5 or a 6 mg.  Advised patient to also work with her therapist since she is having some situational stressors of the holidays.  Advised patient to call the clinic back if she continues to struggle.  We will also send a message to Dr. [name] .

## 2019-12-01 NOTE — Telephone Encounter (Signed)
See other mychart message.

## 2019-12-03 ENCOUNTER — Ambulatory Visit (INDEPENDENT_AMBULATORY_CARE_PROVIDER_SITE_OTHER): Payer: Medicare Other | Admitting: Licensed Clinical Social Worker

## 2019-12-03 ENCOUNTER — Other Ambulatory Visit: Payer: Self-pay

## 2019-12-03 DIAGNOSIS — F3341 Major depressive disorder, recurrent, in partial remission: Secondary | ICD-10-CM

## 2019-12-03 NOTE — Progress Notes (Signed)
Virtual Visit via Video Note   I connected with Angelica Robertson on 12/03/19 at 3:00pm by [employer] video meeting and verified that I am speaking with the correct person using two identifiers.   I discussed the limitations, risks, security and privacy concerns of performing an evaluation and management service by telephone and the availability of in person appointments. I also discussed with the patient that there may be a patient responsible charge related to this service. The patient expressed understanding and agreed to proceed.   I discussed the assessment and treatment plan with the patient. The patient was provided an opportunity to ask questions and all were answered. The patient agreed with the plan and demonstrated an understanding of the instructions.   The patient was advised to call back or seek an in-person evaluation if the symptoms worsen or if the condition fails to improve as anticipated.   I provided 30 minutes of non-face-to-face time during this encounter.     Shade Flood, LCSW, LCASA ________________________ THERAPIST PROGRESS NOTE  Session Time: 3:00pm - 3:30pm  Participation Level: Active   Behavioral Response: Alert, neatly groomed, casually dressed, euthymic  Type of Therapy:  Individual Therapy  Treatment Goals addressed: Depression and anxiety management; sleep hygiene    Interventions: CBT  Summary: Angelica Robertson presented for virtual session today on time, alert, oriented x5, with no evidence or self-report of SI/HI or A/V H.  She reported emotional ratings of 3/10 for for depression, 4/10 for anxiety, and 3/10 for pain today.  She reported that she has been feeling better since the previous week and stated "I feel more hopeful.  I need to concentrate on not letting myself be too concerned about tomorrow and just focus on today". Angelica Robertson reported progress in her routine, can concentrate better, has an improved mindset, is going out for at least 30 minutes of  exercise daily, and feelings like her neck pain is improving as she continues with physical therapy.  Angelica Robertson reported that she has been utilizing Youtube for help with guided meditations each day, and believes this is one reason for her improved mood.  Angelica Robertson reported that one area she is still struggling with is getting consistent sleep through the night.  Angelica Robertson was compliant in explaining sleep routine with clinician and noted that she is keeping a regular schedule, has created a sleep ritual involving guided meditations on Youtube before bed to make her more relaxed, has put black up curtain in the bedroom, avoids eating or taking in caffiene before bed, avoids naps, and is keeping a sleep diary too.  Angelica Robertson reported that because of her recent injury she is unable to exercise as regularly as she used to, and agreed with clinician's suggestion that this could be impacting her previous sleep schedule.  She reported that she would try getting up and reading or walking around when she wakes up too early so that she can exhaust herself enough to fall back asleep, instead of laying in the bed and becoming anxious.  She reported that she would follow up in one week.        Suicidal/Homicidal: None, without plan or intent   Therapist Response: Clinician met with Angelica Robertson for virtual session on [employer] application today.  Clinician assessed for safety.  Clinician inquired about [place]'s present emotional ratings, as well as any significant changes in thoughts, feelings, and behaviors since previous conversation.  Clinician inquired about recent successes and struggles in [place] daily life.  Clinician praised Angelica Robertson for staying  productive each day, using relaxation techniques as suggested, and pointed out that this new routine appears to have positively impacted her mood as of late.  Clinician empathized with Angelica Robertson regarding her inability to stay asleep all night, and went through sleep hygiene checklist with her during this session, including  various strategies she could utilize at this time to avoid waking up too early in the morning.  Clinician pointed out that based upon the fact that Angelica Robertson is already compliant with many of these areas for successful sleep patterns, she may be waking up earlier because she has not needed as much rest due to recent injury decreasing daily physical activity.  Clinician recommended that she try technique of "Getting up and trying again" when she wakes up too early and engaging in some light activity so that she can tire herself out again.  Clinician will continue to monitor.      Plan: Meet again in 1 week virtually.   Diagnosis: Recurrent major depressive disorder, in partial remission   Shade Flood, [place], [place] 12/03/19

## 2019-12-10 ENCOUNTER — Ambulatory Visit (HOSPITAL_COMMUNITY): Payer: Medicare Other | Admitting: Licensed Clinical Social Worker

## 2019-12-10 ENCOUNTER — Encounter: Payer: Self-pay | Admitting: Internal Medicine

## 2019-12-10 ENCOUNTER — Encounter: Payer: Self-pay | Admitting: Psychiatry

## 2019-12-10 ENCOUNTER — Ambulatory Visit (INDEPENDENT_AMBULATORY_CARE_PROVIDER_SITE_OTHER): Payer: Medicare Other | Admitting: Psychiatry

## 2019-12-10 ENCOUNTER — Other Ambulatory Visit: Payer: Self-pay

## 2019-12-10 DIAGNOSIS — F419 Anxiety disorder, unspecified: Secondary | ICD-10-CM | POA: Diagnosis not present

## 2019-12-10 DIAGNOSIS — F3341 Major depressive disorder, recurrent, in partial remission: Secondary | ICD-10-CM | POA: Diagnosis not present

## 2019-12-10 MED ORDER — BUSPIRONE HCL 5 MG PO TABS: 5.0000 mg | ORAL_TABLET | Freq: Two times a day (BID) | ORAL | 0 refills | Status: DC

## 2019-12-10 NOTE — Progress Notes (Signed)
[facility] MD OP Progress Note  I connected with  Angelica Robertson on 12/10/19 by a video enabled telemedicine application and verified that I am speaking with the correct person using two identifiers.   I discussed the limitations of evaluation and management by telemedicine. The patient expressed understanding and agreed to proceed.  I initially met with her by telemedicine however due to sound issues had to switch to telephone encounter.    12/10/2019 10:40 AM Angelica Robertson  MRN:  [id number]  Chief Complaint: " I feel anxious and depressed in the mornings."  HPI: I initially connected with the patient by telemedicine however there were some issues.  Patient panicked immediately and became distressed.  I called the patient back on the phone and encouraged her to calm down and reassured her. After reassurance, patient calmed down and reported that she has a rough time in the mornings.  She stated that she feels a lot of anxiety in the morning time and sometimes has headaches.  She feels that maybe the sertraline dose is not enough and it does not last throughout the night and by morning time she is withdrawing from it.  She stated that she would like to try going up on the dose to 100 mg now as she believes that the dose increase from 75 mg to 100 mg should not cause her to have side effects like last time. Patient had developed a lot of jitteriness last time when she tried to increase the dose of Zoloft from 50 mg to 100 mg. She also feels that her anxiety gets the best of her and she still doing as well as she would like to.  She was agreeable to trying buspirone after she was explained that it is a nonaddictive medication.  She is still not sleeping as well as she would like to.  She gets into bed by 10 PM falls asleep by 11 PM.  She generally wakes up around 4 AM and will sometimes able to go back to sleep after that.  She tried a higher dose of melatonin recently but did not find that to be  beneficial. She has been seeing a counselor regularly for therapy sessions and believes they are helping.  She still fearful of getting worse and ending up in the hospital like in the past. She reported that she did not cook all week for this Christmas and just had a bunch at her son's house with his family.  She is planning to do the same for New Year's. She denied any suicidal ideations. She stated that she is likely to have a good support system in her husband and her son.  Visit Diagnosis:    ICD-10-CM   1. Recurrent major depressive disorder, in partial remission (HCC)  F33.41 busPIRone (BUSPAR) 5 MG tablet  2. Anxiety  F41.9 busPIRone (BUSPAR) 5 MG tablet    Past Psychiatric History: depression, anxiety  Past Medical History:  Past Medical History:  Diagnosis Date  . Depression 2000  . Heart murmur   . Hypercholesterolemia Osteoporosis    Past Surgical History:  Procedure Laterality Date  . ABDOMINAL HYSTERECTOMY  1980  . COLONOSCOPY WITH PROPOFOL N/A 03/05/2018   Procedure: COLONOSCOPY WITH PROPOFOL;  Surgeon: Toledo, [name], MD;  Location: [facility] ENDOSCOPY;  Service: Endoscopy;  Laterality: N/A;    Family Psychiatric History: denied  Family History:  Family History  Problem Relation Age of Onset  . Heart disease Mother   . Diabetes Mother   .  Heart disease Father   . Diabetes Father   . Colon cancer Other   . Congestive Heart Failure Brother   . Colon cancer Brother   . BRCA 1/2 Neg Hx   . Breast cancer Neg Hx   . Cowden syndrome Neg Hx   . DES usage Neg Hx   . Endometrial cancer Neg Hx   . Li-Fraumeni syndrome Neg Hx   . Ovarian cancer Neg Hx     Social History:  Social History   Socioeconomic History  . Marital status: Married    Spouse name: robert  . Number of children: 2  . Years of education: Not on file  . Highest education level: Not on file  Occupational History  . Not on file  Tobacco Use  . Smoking status: Never Smoker  . Smokeless  tobacco: Never Used  Substance and Sexual Activity  . Alcohol use: Yes    Alcohol/week: 0.0 standard drinks    Comment: occasionally - socially during the summer a glass of wine or beer  . Drug use: No  . Sexual activity: Yes  Other Topics Concern  . Not on file  Social History Narrative  . Not on file   Social Determinants of Health   Financial Resource Strain:   . Difficulty of Paying Living Expenses: Not on file  Food Insecurity:   . Worried About Running Out of Food in the Last Year: Not on file  . Ran Out of Food in the Last Year: Not on file  Transportation Needs:   . Lack of Transportation (Medical): Not on file  . Lack of Transportation (Non-Medical): Not on file  Physical Activity: Sufficiently Active  . Days of Exercise per Week: 5 days  . Minutes of Exercise per Session: 30 min  Stress: Stress Concern Present  . Feeling of Stress : To some extent  Social Connections: Unknown  . Frequency of Communication with Friends and Family: Not on file  . Frequency of Social Gatherings with Friends and Family: Not on file  . Attends Religious Services: Not on file  . Active Member of Clubs or Organizations: Not on file  . Attends Club or Organization Meetings: Not on file  . Marital Status: Married    Allergies: No Known Allergies  Metabolic Disorder Labs: Lab Results  Component Value Date   HGBA1C 6.1 06/02/2019   No results found for: PROLACTIN Lab Results  Component Value Date   CHOL 169 06/02/2019   TRIG 117.0 06/02/2019   HDL 57.50 06/02/2019   CHOLHDL 3 06/02/2019   VLDL 23.4 06/02/2019   LDLCALC 88 06/02/2019   LDLCALC 74 10/01/2018   Lab Results  Component Value Date   TSH 1.02 06/02/2019   TSH 1.56 03/27/2018    Therapeutic Level Labs: No results found for: LITHIUM No results found for: VALPROATE No components found for:  CBMZ  Current Medications: Current Outpatient Medications  Medication Sig Dispense Refill  . aspirin EC 81 MG tablet Take  81 mg by mouth daily.    . busPIRone (BUSPAR) 5 MG tablet Take 1 tablet (5 mg total) by mouth 2 (two) times daily. 60 tablet 0  . calcium carbonate (OS-CAL) 600 MG TABS tablet Take 600 mg by mouth 2 (two) times daily with a meal.    . Calcium-Magnesium-Zinc 333-133-5 MG TABS Take by mouth.    . Cholecalciferol (VITAMIN D PO) Take 250 mg by mouth 2 (two) times daily.    . MELATONIN PO Take by mouth.    .   Misc Natural Products (GLUCOSAMINE CHONDROITIN VIT D3 PO) Take 1 capsule by mouth.    . sertraline (ZOLOFT) 100 MG tablet Take 1 tablet (100 mg total) by mouth daily. 30 tablet 2  . simvastatin (ZOCOR) 20 MG tablet Take 1 tablet (20 mg total) by mouth daily. 90 tablet 1   No current facility-administered medications for this visit.     Musculoskeletal: Strength & Muscle Tone: unable to assess due to telemed visit Gait & Station: unable to assess due to telemed visit Patient leans: unable to assess due to telemed visit   Psychiatric Specialty Exam: Review of Systems  Last menstrual period 11/27/1979.There is no height or weight on file to calculate BMI.  General Appearance: Fairly Groomed  Eye Contact:  Good  Speech:  Clear and Coherent and Normal Rate  Volume:  Normal  Mood:  Anxious  Affect:  Congruent  Thought Process:  Goal Directed, Linear and Descriptions of Associations: Intact  Orientation:  Full (Time, Place, and Person)  Thought Content: Logical   Suicidal Thoughts:  No  Homicidal Thoughts:  No  Memory:  Recent;   Good Remote;   Good  Judgement:  Fair  Insight:  Fair  Psychomotor Activity:  Normal  Concentration:  Concentration: Good and Attention Span: Good  Recall:  Good  Fund of Knowledge: Good  Language: Good  Akathisia:  Negative  Handed:  Right  AIMS (if indicated): not done  Assets:  Communication Skills Desire for Improvement Financial Resources/Insurance Housing Social Support  ADL's:  Intact  Cognition: WNL  Sleep:  Fair   Screenings: AUDIT      Ad[facility] (Discharged) [facility]  Alcohol Use Disorder Identification Test Final Score (AUDIT)  0    Mini-Mental     Clinical Support from 02/27/2018 in [facility] Clinical Support from 02/02/2017 in [facility] Clinical Support from 02/03/2016 in [facility]  Total Score (max 30 points )  30  30  30    PHQ2-9     Office Visit from 10/21/2019 in [facility] Office Visit from 06/02/2019 in [facility] Clinical Support from 03/11/2019 in [facility] Clinical Support from 02/27/2018 in [facility] Clinical Support from 02/02/2017 in [facility]   PHQ-2 Total Score  6  0  0  0  0  PHQ-9 Total Score  17  0  --  --  --       Assessment and Plan: Patient is still experiencing depressive symptoms and a lot of anxiety especially in the morning hours.  She would like to go up on the dose of sertraline to 100 mg for optimal effect.  She was also agreeable to try buspirone 5 mg twice daily for anxiety. Potential side effects of medication and risks vs benefits of treatment vs non-treatment were explained and discussed. All questions were answered.  1. Recurrent major depressive disorder, in partial remission (HCC) - Increase Sertraline 100 mg daily - Start busPIRone (BUSPAR) 5 MG tablet; Take 1 tablet (5 mg total) by mouth 2 (two) times daily.  Dispense: 60 tablet; Refill: 0  2. Anxiety - busPIRone (BUSPAR) 5 MG tablet; Take 1 tablet (5 mg total) by mouth 2 (two) times daily.  Dispense: 60 tablet; Refill: 0  Continue individual therapy with Mr. Bates. F/up in 3 weeks.   , MD 12/10/2019, 10:40 AM 

## 2019-12-15 ENCOUNTER — Telehealth: Payer: Self-pay

## 2019-12-15 NOTE — Telephone Encounter (Signed)
she feel like she having side affectect from medication. she is exremely tired and have headaches. and just not feeling right.

## 2019-12-17 ENCOUNTER — Inpatient Hospital Stay
Admission: EM | Admit: 2019-12-17 | Discharge: 2019-12-22 | DRG: 885 | Disposition: A | Discharge status: Home / Self Care | Payer: Medicare PPO | Source: Intra-hospital | Attending: Psychiatry | Admitting: Psychiatry

## 2019-12-17 ENCOUNTER — Encounter: Payer: Self-pay | Admitting: Psychiatry

## 2019-12-17 ENCOUNTER — Ambulatory Visit (INDEPENDENT_AMBULATORY_CARE_PROVIDER_SITE_OTHER): Payer: Medicare PPO

## 2019-12-17 ENCOUNTER — Other Ambulatory Visit: Payer: Self-pay

## 2019-12-17 ENCOUNTER — Emergency Department
Admission: EM | Admit: 2019-12-17 | Discharge: 2019-12-17 | Disposition: A | Discharge status: Other Acute Inpatient Hospital | Payer: Medicare PPO | Attending: Emergency Medicine | Admitting: Emergency Medicine

## 2019-12-17 ENCOUNTER — Telehealth: Payer: Self-pay

## 2019-12-17 ENCOUNTER — Ambulatory Visit (HOSPITAL_COMMUNITY): Payer: Medicare PPO | Admitting: Licensed Clinical Social Worker

## 2019-12-17 VITALS — Temp 96.9°F

## 2019-12-17 DIAGNOSIS — Z7982 Long term (current) use of aspirin: Secondary | ICD-10-CM | POA: Diagnosis not present

## 2019-12-17 DIAGNOSIS — F329 Major depressive disorder, single episode, unspecified: Secondary | ICD-10-CM | POA: Insufficient documentation

## 2019-12-17 DIAGNOSIS — G4700 Insomnia, unspecified: Secondary | ICD-10-CM | POA: Diagnosis present

## 2019-12-17 DIAGNOSIS — R45851 Suicidal ideations: Secondary | ICD-10-CM | POA: Diagnosis present

## 2019-12-17 DIAGNOSIS — E538 Deficiency of other specified B group vitamins: Secondary | ICD-10-CM | POA: Diagnosis not present

## 2019-12-17 DIAGNOSIS — Z79899 Other long term (current) drug therapy: Secondary | ICD-10-CM | POA: Diagnosis not present

## 2019-12-17 DIAGNOSIS — M810 Age-related osteoporosis without current pathological fracture: Secondary | ICD-10-CM | POA: Diagnosis not present

## 2019-12-17 DIAGNOSIS — I10 Essential (primary) hypertension: Secondary | ICD-10-CM | POA: Diagnosis not present

## 2019-12-17 DIAGNOSIS — Z046 Encounter for general psychiatric examination, requested by authority: Secondary | ICD-10-CM | POA: Insufficient documentation

## 2019-12-17 DIAGNOSIS — I208 Other forms of angina pectoris: Secondary | ICD-10-CM | POA: Diagnosis not present

## 2019-12-17 DIAGNOSIS — Z20822 Contact with and (suspected) exposure to covid-19: Secondary | ICD-10-CM | POA: Insufficient documentation

## 2019-12-17 DIAGNOSIS — F332 Major depressive disorder, recurrent severe without psychotic features: Principal | ICD-10-CM | POA: Diagnosis present

## 2019-12-17 DIAGNOSIS — I499 Cardiac arrhythmia, unspecified: Secondary | ICD-10-CM | POA: Diagnosis not present

## 2019-12-17 DIAGNOSIS — F17210 Nicotine dependence, cigarettes, uncomplicated: Secondary | ICD-10-CM | POA: Diagnosis not present

## 2019-12-17 DIAGNOSIS — F411 Generalized anxiety disorder: Secondary | ICD-10-CM | POA: Diagnosis not present

## 2019-12-17 DIAGNOSIS — E785 Hyperlipidemia, unspecified: Secondary | ICD-10-CM | POA: Diagnosis not present

## 2019-12-17 HISTORY — DX: Hyperlipidemia, unspecified: E78.5

## 2019-12-17 LAB — CBC
HCT: 41.1 % (ref 36.0–46.0)
Hemoglobin: 13.8 g/dL (ref 12.0–15.0)
MCH: 29.4 pg (ref 26.0–34.0)
MCHC: 33.6 g/dL (ref 30.0–36.0)
MCV: 87.6 fL (ref 80.0–100.0)
Platelets: 338 10*3/uL (ref 150–400)
RBC: 4.69 MIL/uL (ref 3.87–5.11)
RDW: 12.4 % (ref 11.5–15.5)
WBC: 12.2 10*3/uL — ABNORMAL HIGH (ref 4.0–10.5)
nRBC: 0 % (ref 0.0–0.2)

## 2019-12-17 LAB — URINE DRUG SCREEN, QUALITATIVE (ARMC ONLY)
Amphetamines, Ur Screen: NOT DETECTED
Barbiturates, Ur Screen: NOT DETECTED
Benzodiazepine, Ur Scrn: NOT DETECTED
Cannabinoid 50 Ng, Ur ~~LOC~~: NOT DETECTED
Cocaine Metabolite,Ur ~~LOC~~: NOT DETECTED
MDMA (Ecstasy)Ur Screen: NOT DETECTED
Methadone Scn, Ur: NOT DETECTED
Opiate, Ur Screen: NOT DETECTED
Phencyclidine (PCP) Ur S: NOT DETECTED
Tricyclic, Ur Screen: NOT DETECTED

## 2019-12-17 LAB — COMPREHENSIVE METABOLIC PANEL
ALT: 19 U/L (ref 0–44)
AST: 20 U/L (ref 15–41)
Albumin: 4.3 g/dL (ref 3.5–5.0)
Alkaline Phosphatase: 53 U/L (ref 38–126)
Anion gap: 8 (ref 5–15)
BUN: 17 mg/dL (ref 8–23)
CO2: 26 mmol/L (ref 22–32)
Calcium: 9.5 mg/dL (ref 8.9–10.3)
Chloride: 97 mmol/L — ABNORMAL LOW (ref 98–111)
Creatinine, Ser: 0.67 mg/dL (ref 0.44–1.00)
GFR calc Af Amer: >60 mL/min (ref >60)
GFR calc non Af Amer: >60 mL/min (ref >60)
Glucose, Bld: 132 mg/dL — ABNORMAL HIGH (ref 70–99)
Potassium: 4.1 mmol/L (ref 3.5–5.1)
Sodium: 131 mmol/L — ABNORMAL LOW (ref 135–145)
Total Bilirubin: 0.7 mg/dL (ref 0.3–1.2)
Total Protein: 7.2 g/dL (ref 6.5–8.1)

## 2019-12-17 LAB — SALICYLATE LEVEL: Salicylate Lvl: <7 mg/dL — ABNORMAL LOW (ref 7.0–30.0)

## 2019-12-17 LAB — RESPIRATORY PANEL BY RT PCR (FLU A&B, COVID)
Influenza A by PCR: NEGATIVE
Influenza B by PCR: NEGATIVE
SARS Coronavirus 2 by RT PCR: NEGATIVE

## 2019-12-17 LAB — T4, FREE: Free T4: 0.99 ng/dL (ref 0.61–1.12)

## 2019-12-17 LAB — TSH: TSH: 1.511 u[IU]/mL (ref 0.350–4.500)

## 2019-12-17 LAB — ETHANOL: Alcohol, Ethyl (B): <10 mg/dL (ref <10)

## 2019-12-17 LAB — ACETAMINOPHEN LEVEL: Acetaminophen (Tylenol), Serum: 33 ug/mL — ABNORMAL HIGH (ref 10–30)

## 2019-12-17 MED ORDER — CALCIUM CARBONATE ANTACID 500 MG PO CHEW
600.0000 mg | CHEWABLE_TABLET | Freq: Two times a day (BID) | ORAL | Status: DC
  Administered 2019-12-18 (×2): 500 mg via ORAL
  Filled 2019-12-17 (×11): qty 1

## 2019-12-17 MED ORDER — ACETAMINOPHEN 325 MG PO TABS: 650.0000 mg | ORAL_TABLET | Freq: Four times a day (QID) | ORAL | Status: DC | PRN

## 2019-12-17 MED ORDER — SERTRALINE HCL 100 MG PO TABS
100.0000 mg | ORAL_TABLET | Freq: Every day | ORAL | Status: DC
  Administered 2019-12-18: 08:00:00 100 mg via ORAL
  Filled 2019-12-17 (×2): qty 1

## 2019-12-17 MED ORDER — SIMVASTATIN 40 MG PO TABS
20.0000 mg | ORAL_TABLET | Freq: Every day | ORAL | Status: DC
  Administered 2019-12-17 – 2019-12-21 (×5): 20 mg via ORAL
  Filled 2019-12-17 (×5): qty 1

## 2019-12-17 MED ORDER — ASPIRIN EC 81 MG PO TBEC
81.0000 mg | DELAYED_RELEASE_TABLET | Freq: Every day | ORAL | Status: DC
  Administered 2019-12-17 – 2019-12-22 (×6): 81 mg via ORAL
  Filled 2019-12-17 (×6): qty 1

## 2019-12-17 MED ORDER — CYANOCOBALAMIN 1000 MCG/ML IJ SOLN
1000.0000 ug | Freq: Once | INTRAMUSCULAR | Status: AC
  Administered 2019-12-17: 1000 ug via INTRAMUSCULAR

## 2019-12-17 MED ORDER — ALUM & MAG HYDROXIDE-SIMETH 200-200-20 MG/5ML PO SUSP: 30.0000 mL | ORAL | Status: DC | PRN

## 2019-12-17 MED ORDER — CLONAZEPAM 0.5 MG PO TABS
0.5000 mg | ORAL_TABLET | Freq: Two times a day (BID) | ORAL | Status: DC
  Administered 2019-12-17: 0.5 mg via ORAL
  Filled 2019-12-17: qty 1

## 2019-12-17 MED ORDER — CLONAZEPAM 0.5 MG PO TABS
0.5000 mg | ORAL_TABLET | Freq: Two times a day (BID) | ORAL | Status: DC
  Administered 2019-12-17 – 2019-12-21 (×8): 0.5 mg via ORAL
  Filled 2019-12-17 (×8): qty 1

## 2019-12-17 MED ORDER — MAGNESIUM HYDROXIDE 400 MG/5ML PO SUSP: 30.0000 mL | Freq: Every day | ORAL | Status: DC | PRN

## 2019-12-17 NOTE — BH Assessment (Signed)
Assessment Note  Angelica Robertson is an 74 y.o. female who presents to ED with increased sxs of anxiety and depression. Patient also reports passive suicidal thoughts with no specific plan - "I wake up and have thoughts of harming myself. I feel like I don't have any energy". Pt had a recent medication change and she believes this is what has caused her to "feel anxious and jittery". Pt was somewhat tearful while speaking with this Probation officer and appeared to be unsure if she would be safe if she were to be discharged home. She reports recently following-up with [facility] for medication management. She reported a decrease in her sleep patterns and a decrease in her appetite. Patient had an inpatient hospitalization in 2014 [facility]) for a similar presentation. Patient is retired and lives home with her husband of 106 years. Pt was alert and oriented x4, while pleasant in her demeanor. Pt denied HI/AVH. She was agreeable to be voluntarily admitted for inpatient treatment for stabilization.   Diagnosis: Major Depressive Disorder  Past Medical History:  Past Medical History:  Diagnosis Date  . Depression 2000  . Heart murmur   . Hypercholesterolemia Osteoporosis  . Hyperlipemia     Past Surgical History:  Procedure Laterality Date  . ABDOMINAL HYSTERECTOMY  1980  . COLONOSCOPY WITH PROPOFOL N/A 03/05/2018   Procedure: COLONOSCOPY WITH PROPOFOL;  Surgeon: Toledo, [name], MD;  Location: [facility] ENDOSCOPY;  Service: Endoscopy;  Laterality: N/A;    Family History:  Family History  Problem Relation Age of Onset  . Heart disease Mother   . Diabetes Mother   . Heart disease Father   . Diabetes Father   . Colon cancer Other   . Congestive Heart Failure Brother   . Colon cancer Brother   . BRCA 1/2 Neg Hx   . Breast cancer Neg Hx   . Cowden syndrome Neg Hx   . DES usage Neg Hx   . Endometrial cancer Neg Hx   . Li-Fraumeni syndrome Neg Hx   . Ovarian cancer Neg Hx      Social History:  reports that she has never smoked. She has never used smokeless tobacco. She reports current alcohol use. She reports that she does not use drugs.  Additional Social History:  Alcohol / Drug Use Pain Medications: See MAR Prescriptions: See MAR Over the Counter: See MAR History of alcohol / drug use?: No history of alcohol / drug abuse  CIWA: CIWA-Ar BP: (!) 142/80 Pulse Rate: 85 COWS:    Allergies: No Known Allergies  Home Medications: (Not in a hospital admission)   OB/GYN Status:  Patient's last menstrual period was 11/27/1979.  General Assessment Data Location of Assessment: [facility] ED TTS Assessment: In system Is this a Tele or Face-to-Face Assessment?: Face-to-Face Is this an Initial Assessment or a Re-assessment for this encounter?: Initial Assessment Patient Accompanied by:: N/A Language Other than English: No Living Arrangements: Other (Comment)(Private Residence) What gender do you identify as?: Female Marital status: Married(52 Years) Elwin Sleight name: [facility]N Pregnancy Status: No Living Arrangements: Spouse/significant other Can pt return to current living arrangement?: Yes Admission Status: Voluntary Is patient capable of signing voluntary admission?: Yes Referral Source: Self/Family/Friend Insurance type: [employer] Medicare  Medical Screening Exam ([place]) Medical Exam completed: Yes  Crisis Care Plan Living Arrangements: Spouse/significant other Legal Guardian: Other:(Self) Name of Psychiatrist: Dr. Barnetta [facility] Name of Therapist: None  Education Status Is patient currently in school?: No Is the patient employed,  unemployed or receiving disability?: Unemployed(Retired - Technical brewer)  Risk to self with the past 6 months Suicidal Ideation: Yes-Currently Present Has patient been a risk to self within the past 6 months prior to admission? : No Suicidal Intent: No Has patient had any suicidal  intent within the past 6 months prior to admission? : No Is patient at risk for suicide?: No Suicidal Plan?: No Has patient had any suicidal plan within the past 6 months prior to admission? : No Access to Means: Yes Specify Access to Suicidal Means: Access to Rx meds What has been your use of drugs/alcohol within the last 12 months?: None Previous Attempts/Gestures: No How many times?: 0 Other Self Harm Risks: None Triggers for Past Attempts: None known Intentional Self Injurious Behavior: None Family Suicide History: Unknown Recent stressful life event(s): Other (Comment)(Change in Home Meds) Persecutory voices/beliefs?: No Depression: Yes Depression Symptoms: Tearfulness Substance abuse history and/or treatment for substance abuse?: No Suicide prevention information given to non-admitted patients: Not applicable  Risk to Others within the past 6 months Homicidal Ideation: No Does patient have any lifetime risk of violence toward others beyond the six months prior to admission? : No Thoughts of Harm to Others: No Current Homicidal Intent: No Current Homicidal Plan: No Access to Homicidal Means: No Identified Victim: None History of harm to others?: No Assessment of Violence: None Noted Violent Behavior Description: None Does patient have access to weapons?: No Criminal Charges Pending?: No Does patient have a court date: No Is patient on probation?: No  Psychosis Hallucinations: None noted Delusions: None noted  Mental Status Report Appearance/Hygiene: In scrubs, Unremarkable Eye Contact: Good Motor Activity: Freedom of movement, Unremarkable Speech: Logical/coherent Level of Consciousness: Alert Mood: Fearful, Anxious, Depressed, Pleasant Affect: Anxious Anxiety Level: Moderate Thought Processes: Coherent, Relevant Judgement: Unimpaired Orientation: Person, Place, Time, Situation, Appropriate for developmental age Obsessive Compulsive Thoughts/Behaviors:  None  Cognitive Functioning Concentration: Normal Memory: Recent Intact, Remote Intact Is patient IDD: No Insight: Good Impulse Control: Good Appetite: Fair Have you had any weight changes? : No Change Sleep: Decreased Total Hours of Sleep: 5 Vegetative Symptoms: None  ADLScreening [facility] Assessment Services) Patient's cognitive ability adequate to safely complete daily activities?: Yes Patient able to express need for assistance with ADLs?: Yes Independently performs ADLs?: Yes (appropriate for developmental age)  Prior Inpatient Therapy Prior Inpatient Therapy: Yes Prior Therapy Dates: 2014 Prior Therapy Facilty/Provider([facility]): [facility] Reason for Treatment: Anxiety/Depression  Prior Outpatient Therapy Prior Outpatient Therapy: Yes Prior Therapy Dates: Current [facility] Reason for Treatment: Anxiety/Depression/Medication Management Does patient have an ACCT team?: No Does patient have Intensive In-House Services?  : No Does patient have [facility] services? : No Does patient have P4CC services?: No  ADL Screening (condition at time of admission) Patient's cognitive ability adequate to safely complete daily activities?: Yes Patient able to express need for assistance with ADLs?: Yes Independently performs ADLs?: Yes (appropriate for developmental age)       Abuse/Neglect Assessment (Assessment to be complete while patient is alone) Abuse/Neglect Assessment Can Be Completed: Yes Physical Abuse: Denies Verbal Abuse: Denies Sexual Abuse: Denies Exploitation of patient/patient's resources: Denies Self-Neglect: Denies Values / Beliefs Cultural Requests During Hospitalization: None Spiritual Requests During Hospitalization: None Consults Spiritual Care Consult Needed: No Transition of Care Team Consult Needed: No Advance Directives (For Healthcare) Does Patient Have a Medical Advance Directive?: Yes Does  patient want to make changes to medical advance directive?: No - Patient declined  Type of Advance Directive: Living will Copy of Living Will in Chart?: No - copy requested       Child/Adolescent Assessment Running Away Risk: (Patient is an adult)  Disposition:  Disposition Initial Assessment Completed for this Encounter: Yes Disposition of Patient: Admit Type of inpatient treatment program: Adult Patient refused recommended treatment: No Mode of transportation if patient is discharged/movement?: N/A Patient referred to: Other (Comment)([facility])  On Site Evaluation by:   Reviewed with Physician:    Allyne Gee 12/17/2019 5:48 PM

## 2019-12-17 NOTE — Tx Team (Signed)
Initial Treatment Plan 12/17/2019 6:33 PM [name] KKD:[id number]    PATIENT STRESSORS: Health problems Loss of weight Medication change or noncompliance   PATIENT STRENGTHS: Ability for insight Active sense of humor Average or above average intelligence Capable of independent living Communication skills Motivation for treatment/growth Supportive family/friends   PATIENT IDENTIFIED PROBLEMS: Depression  12/17/2019  Suicidal  12/17/2019  Anxiety  12/17/2019  Weight loss 12/17/2019  Loss of Sleep 12/17/2019             DISCHARGE CRITERIA:  Ability to meet basic life and health needs Improved stabilization in mood, thinking, and/or behavior Medical problems require only outpatient monitoring  PRELIMINARY DISCHARGE PLAN: Outpatient therapy Return to previous living arrangement  PATIENT/FAMILY INVOLVEMENT: This treatment plan has been presented to and reviewed with the patient, Angelica Robertson, and/or family member.  The patient and family have been given the opportunity to ask questions and make suggestions.  Crist Infante, RN 12/17/2019, 6:33 PM

## 2019-12-17 NOTE — Consult Note (Signed)
[place] [facility] Consult   Reason for Consult: Depression and SI Referring Physician: Dr. Jari Pigg   Patient Identification: Angelica Robertson MRN:  [id number] Principal Diagnosis: <principal problem not specified> Diagnosis:  Active Problems:   * No active hospital problems. *   Total Time spent with patient: 45 minutes  Subjective:   Angelica Robertson is a 74 y.o. female patient who presented with depression with suicidal ideas.  HPI: Patient is 74 year old female with history of major depressive disorder who presents with increasing suicidal ideation.  Patient reports that she has a history of depression starting back approximately 20 years.  She reports that she had since been managed on Zoloft low-dose but had recently been experiencing increasing signs and symptoms the past month.  She has her medication adjusted however patient was not obtaining any relief.  Today after 3 days of trying to adjust her medication on outpatient basis, patient became distressed by the increasing suicidal ideation she had this morning and decided with the assistance of her husband to come into the hospital.  Patient reports having to stay inpatient approximately 2 times in the past for similar episodes of suicidal ideation.  Patient denies any recent substance use.  She denies any audio or visual hallucinations.  She denies any manic or paranoid symptoms.  With regard to her depressive symptoms patient acknowledges poor sleep and poor appetite.  She also acknowledges low mood as well as increasing anxiety.  Patient states that the thoughts are worse in the morning and will tend to improve as the day goes on.  Patient denies any current social stressors in her life.  She goes on to detail that her and her husband are financially stable and have been retired approximately 6 years.  She denies any recent losses or trauma.  Past Psychiatric History: Patient has 2 prior hospitalizations for depressive symptoms.  She  denies any previous attempts.  She has been maintained on Zoloft 2 mg for approximately 6 years prior to her recent increases in medication.  Risk to Self:  Yes Risk to Others:  No Prior Inpatient Therapy:  Yes Prior Outpatient Therapy:  Yes  Past Medical History:  Past Medical History:  Diagnosis Date  . Depression 2000  . Heart murmur   . Hypercholesterolemia Osteoporosis  . Hyperlipemia     Past Surgical History:  Procedure Laterality Date  . ABDOMINAL HYSTERECTOMY  1980  . COLONOSCOPY WITH PROPOFOL N/A 03/05/2018   Procedure: COLONOSCOPY WITH PROPOFOL;  Surgeon: Toledo, [name], MD;  Location: [facility] ENDOSCOPY;  Service: Endoscopy;  Laterality: N/A;   Family History:  Family History  Problem Relation Age of Onset  . Heart disease Mother   . Diabetes Mother   . Heart disease Father   . Diabetes Father   . Colon cancer Other   . Congestive Heart Failure Brother   . Colon cancer Brother   . BRCA 1/2 Neg Hx   . Breast cancer Neg Hx   . Cowden syndrome Neg Hx   . DES usage Neg Hx   . Endometrial cancer Neg Hx   . Li-Fraumeni syndrome Neg Hx   . Ovarian cancer Neg Hx    Family Psychiatric  History: Unknown  social History:  Social History   Substance and Sexual Activity  Alcohol Use Yes  . Alcohol/week: 0.0 standard drinks   Comment: occasionally - socially during the summer a glass of wine or beer     Social History   Substance and Sexual  Activity  Drug Use No    Social History   Socioeconomic History  . Marital status: Married    Spouse name: robert  . Number of children: 2  . Years of education: Not on file  . Highest education level: Not on file  Occupational History  . Not on file  Tobacco Use  . Smoking status: Never Smoker  . Smokeless tobacco: Never Used  Substance and Sexual Activity  . Alcohol use: Yes    Alcohol/week: 0.0 standard drinks    Comment: occasionally - socially during the summer a glass of wine or beer  . Drug use: No  .  Sexual activity: Yes  Other Topics Concern  . Not on file  Social History Narrative  . Not on file   Social Determinants of Health   Financial Resource Strain:   . Difficulty of Paying Living Expenses: Not on file  Food Insecurity:   . Worried About Charity fundraiser in the Last Year: Not on file  . Ran Out of Food in the Last Year: Not on file  Transportation Needs:   . Lack of Transportation (Medical): Not on file  . Lack of Transportation (Non-Medical): Not on file  Physical Activity: Sufficiently Active  . Days of Exercise per Week: 5 days  . Minutes of Exercise per Session: 30 min  Stress: Stress Concern Present  . Feeling of Stress : To some extent  Social Connections: Unknown  . Frequency of Communication with Friends and Family: Not on file  . Frequency of Social Gatherings with Friends and Family: Not on file  . Attends Religious Services: Not on file  . Active Member of Clubs or Organizations: Not on file  . Attends Archivist Meetings: Not on file  . Marital Status: Married   Additional Social History: Patient currently living with her husband.  Has adult children that are grown.  Denies any substance use    Allergies:  No Known Allergies  Labs:  Results for orders placed or performed during the hospital encounter of 12/17/19 (from the past 48 hour(s))  Comprehensive metabolic panel     Status: Abnormal   Collection Time: 12/17/19 11:23 AM  Result Value Ref Range   Sodium 131 (L) 135 - 145 mmol/L   Potassium 4.1 3.5 - 5.1 mmol/L   Chloride 97 (L) 98 - 111 mmol/L   CO2 26 22 - 32 mmol/L   Glucose, Bld 132 (H) 70 - 99 mg/dL   BUN 17 8 - 23 mg/dL   Creatinine, Ser 0.67 0.44 - 1.00 mg/dL   Calcium 9.5 8.9 - 10.3 mg/dL   Total Protein 7.2 6.5 - 8.1 g/dL   Albumin 4.3 3.5 - 5.0 g/dL   AST 20 15 - 41 U/L   ALT 19 0 - 44 U/L   Alkaline Phosphatase 53 38 - 126 U/L   Total Bilirubin 0.7 0.3 - 1.2 mg/dL   GFR calc non Af Amer >60 >60 mL/min   GFR calc  Af Amer >60 >60 mL/min   Anion gap 8 5 - 15    Comment: Performed at [facility], [address], [place], [place] [address]  Ethanol     Status: None   Collection Time: 12/17/19 11:23 AM  Result Value Ref Range   Alcohol, Ethyl (B) <10 <10 mg/dL    Comment: (NOTE) Lowest detectable limit for serum alcohol is 10 mg/dL. For medical purposes only. Performed at [facility], [address],  [place], [place] [address]   Salicylate level     Status: Abnormal   Collection Time: 12/17/19 11:23 AM  Result Value Ref Range   Salicylate Lvl <6.6 (L) 7.0 - 30.0 mg/dL    Comment: Performed at [facility], [address] [place], [place] [address]  Acetaminophen level     Status: Abnormal   Collection Time: 12/17/19 11:23 AM  Result Value Ref Range   Acetaminophen (Tylenol), Serum 33 (H) 10 - 30 ug/mL    Comment: (NOTE) Therapeutic concentrations vary significantly. A range of 10-30 ug/mL  may be an effective concentration for many patients. However, some  are best treated at concentrations outside of this range. Acetaminophen concentrations >150 ug/mL at 4 hours after ingestion  and >50 ug/mL at 12 hours after ingestion are often associated with  toxic reactions. Performed at [facility], [address] [place], [place] [address]   cbc     Status: Abnormal   Collection Time: 12/17/19 11:23 AM  Result Value Ref Range   WBC 12.2 (H) 4.0 - 10.5 K/uL   RBC 4.69 3.87 - 5.11 MIL/uL   Hemoglobin 13.8 12.0 - 15.0 g/dL   HCT 41.1 36.0 - 46.0 %   MCV 87.6 80.0 - 100.0 fL   MCH 29.4 26.0 - 34.0 pg   MCHC 33.6 30.0 - 36.0 g/dL   RDW 12.4 11.5 - 15.5 %   Platelets 338 150 - 400 K/uL   nRBC 0.0 0.0 - 0.2 %    Comment: Performed at [facility], [address] [place], [place] [address]  TSH     Status: None   Collection Time: 12/17/19 11:23 AM  Result Value Ref Range   TSH 1.511 0.350 - 4.500 uIU/mL    Comment: Performed by a 3rd  Generation assay with a functional sensitivity of <=0.01 uIU/mL. Performed at [facility], [address] [place], [place] [address]   T4, free     Status: None   Collection Time: 12/17/19 11:23 AM  Result Value Ref Range   Free T4 0.99 0.61 - 1.12 ng/dL    Comment: (NOTE) Biotin ingestion may interfere with free T4 tests. If the results are inconsistent with the TSH level, previous test results, or the clinical presentation, then consider biotin interference. If needed, order repeat testing after stopping biotin. Performed at [facility], [address] [place], [place] [address]   Urine Drug Screen, Qualitative     Status: None   Collection Time: 12/17/19  1:23 PM  Result Value Ref Range   Tricyclic, Ur Screen NONE DETECTED NONE DETECTED   Amphetamines, Ur Screen NONE DETECTED NONE DETECTED   MDMA (Ecstasy)Ur Screen NONE DETECTED NONE DETECTED   Cocaine Metabolite,Ur [place] NONE DETECTED NONE DETECTED   Opiate, Ur Screen NONE DETECTED NONE DETECTED   Phencyclidine (PCP) Ur S NONE DETECTED NONE DETECTED   Cannabinoid 50 Ng, Ur  NONE DETECTED NONE DETECTED   Barbiturates, Ur Screen NONE DETECTED NONE DETECTED   Benzodiazepine, Ur Scrn NONE DETECTED NONE DETECTED   Methadone Scn, Ur NONE DETECTED NONE DETECTED    Comment: (NOTE) Tricyclics + metabolites, urine    Cutoff 1000 ng/mL Amphetamines + metabolites, urine  Cutoff 1000 ng/mL MDMA (Ecstasy), urine              Cutoff 500 ng/mL Cocaine Metabolite, urine          Cutoff 300 ng/mL Opiate + metabolites, urine  Cutoff 300 ng/mL Phencyclidine (PCP), urine         Cutoff 25 ng/mL Cannabinoid, urine                 Cutoff 50 ng/mL Barbiturates + metabolites, urine  Cutoff 200 ng/mL Benzodiazepine, urine              Cutoff 200 ng/mL Methadone, urine                   Cutoff 300 ng/mL The urine drug screen provides only a preliminary, unconfirmed analytical test result and should not be used for  non-medical purposes. Clinical consideration and professional judgment should be applied to any positive drug screen result due to possible interfering substances. A more specific alternate chemical method must be used in order to obtain a confirmed analytical result. Gas chromatography / mass spectrometry (GC/MS) is the preferred confirmat ory method. Performed at [facility], [address] [place], [place] [address]     Current Facility-Administered Medications  Medication Dose Route Frequency Provider Last Rate Last Admin  . clonazePAM (KLONOPIN) tablet 0.5 mg  0.5 mg Oral BID Roni Friberg, Dorene Ar, MD       Current Outpatient Medications  Medication Sig Dispense Refill  . aspirin EC 81 MG tablet Take 81 mg by mouth daily.    . busPIRone (BUSPAR) 5 MG tablet Take 1 tablet (5 mg total) by mouth 2 (two) times daily. 60 tablet 0  . calcium carbonate (OS-CAL) 600 MG TABS tablet Take 600 mg by mouth 2 (two) times daily with a meal.    . Calcium-Magnesium-Zinc 333-133-5 MG TABS Take by mouth.    . Cholecalciferol (VITAMIN D PO) Take 250 mg by mouth 2 (two) times daily.    Marland Kitchen MELATONIN PO Take by mouth.    . Misc Natural Products (GLUCOSAMINE CHONDROITIN VIT D3 PO) Take 1 capsule by mouth.    . sertraline (ZOLOFT) 100 MG tablet Take 1 tablet (100 mg total) by mouth daily. 30 tablet 2  . simvastatin (ZOCOR) 20 MG tablet Take 1 tablet (20 mg total) by mouth daily. 90 tablet 1    Musculoskeletal: Strength & Muscle Tone: within normal limits Gait & Station: normal Patient leans: Backward  Psychiatric Specialty Exam: Physical Exam  Review of Systems  Psychiatric/Behavioral: Positive for decreased concentration, dysphoric mood, sleep disturbance and suicidal ideas. Negative for agitation, behavioral problems, confusion, hallucinations and self-injury. The patient is nervous/anxious. The patient is not hyperactive.     Blood pressure (!) 142/80, pulse 85, temperature 97.9 F  (36.6 C), temperature source Oral, resp. rate 17, height 5' 3"  (1.6 m), weight 59.9 kg, last menstrual period 11/27/1979, SpO2 97 %.Body mass index is 23.38 kg/m.  General Appearance: Casual  Eye Contact:  Good  Speech:  Clear and Coherent  Volume:  Normal  Mood:  Anxious  Affect:  Congruent  Thought Process:  Coherent  Orientation:  Full (Time, Place, and Person)  Thought Content:  Logical and Rumination  Suicidal Thoughts:  Yes.  with intent/plan  Homicidal Thoughts:  No  Memory:  Recent;   Fair  Judgement:  Impaired  Insight:  Fair  Psychomotor Activity:  Normal  Concentration:  Concentration: Fair  Recall:  [employer] of Knowledge:  Fair  Language:  Fair  Akathisia:  No  Handed:  Right  AIMS (if indicated):     Assets:  Communication Skills Desire for Improvement Financial Resources/Insurance Housing Intimacy Leisure Time [place]  Support Talents/Skills Transportation Vocational/Educational  ADL's:  Intact  Cognition:  WNL  Sleep:        Treatment Plan Summary: Patient is a 74 year old female with history of depression presenting with increasing signs and symptoms of depression most notably suicidal ideation.  Patient is attempted to remedy this on outpatient basis but no longer feels safe managing at home.  Patient will benefit from inpatient hospitalization for safety, stabilization, medication management.  Medications: Patient currently taking Zoloft 100 mg p.o. every morning.  Will augment with Klonopin 0.5 mg twice daily  Disposition: Recommend psychiatric Inpatient admission when medically cleared.  Dixie Dials, MD 12/17/2019 4:34 PM

## 2019-12-17 NOTE — ED Notes (Signed)
VOL, pending BMU admit 

## 2019-12-17 NOTE — ED Provider Notes (Signed)
[facility] Emergency Department Provider Note       Time seen: ----------------------------------------- 12:33 PM on 12/17/2019 -----------------------------------------   I have reviewed the triage vital signs and the nursing notes.  HISTORY   Chief Complaint Psychiatric Evaluation   HPI Angelica Robertson is a 74 y.o. female with a history of depression, hyperlipidemia who presents to the ED for suicidal thoughts.  Patient has been feeling depressed and has been taking medication for some time for same.  Patient states she cannot sleep, feel suicidal in the morning.  Her primary care doctor changed her medications and added a new one but her husband is concerned about her.  Past Medical History:  Diagnosis Date  . Depression 2000  . Heart murmur   . Hypercholesterolemia Osteoporosis  . Hyperlipemia     Patient Active Problem List   Diagnosis Date Noted  . Recurrent major depressive disorder, in partial remission (HCC) 12/10/2019  . Anxiety 12/10/2019  . Close exposure to COVID-19 virus 08/04/2019  . Restless leg syndrome 06/02/2019  . Syncope 12/30/2017  . Hyperglycemia 08/26/2015  . Cervicalgia 05/30/2015  . Health care maintenance 02/28/2015  . Osteopenia 08/17/2014  . Neck pain 06/14/2014  . Elevated blood pressure reading 06/14/2014  . Sinus tachycardia 11/15/2012  . Depression 10/10/2012  . Hypercholesterolemia 10/10/2012    Past Surgical History:  Procedure Laterality Date  . ABDOMINAL HYSTERECTOMY  1980  . COLONOSCOPY WITH PROPOFOL N/A 03/05/2018   Procedure: COLONOSCOPY WITH PROPOFOL;  Surgeon: Toledo, Boykin Nearing, MD;  Location: [facility] ENDOSCOPY;  Service: Endoscopy;  Laterality: N/A;    Allergies Patient has no known allergies.  Social History Social History   Tobacco Use  . Smoking status: Never Smoker  . Smokeless tobacco: Never Used  Substance Use Topics  . Alcohol use: Yes    Alcohol/week: 0.0 standard drinks    Comment:  occasionally - socially during the summer a glass of wine or beer  . Drug use: No    Review of Systems Constitutional: Negative for fever. Cardiovascular: Negative for chest pain. Respiratory: Negative for shortness of breath. Gastrointestinal: Negative for abdominal pain, vomiting and diarrhea. Musculoskeletal: Negative for back pain. Skin: Negative for rash. Neurological: Negative for headaches, focal weakness or numbness. Psychiatric: Positive for depression, suicidal thoughts  All systems negative/normal/unremarkable except as stated in the HPI  ____________________________________________   PHYSICAL EXAM:  VITAL SIGNS: ED Triage Vitals  Enc Vitals Group     BP 12/17/19 1120 (!) 142/80     Pulse Rate 12/17/19 1120 85     Resp 12/17/19 1120 17     Temp 12/17/19 1120 97.9 F (36.6 C)     Temp Source 12/17/19 1120 Oral     SpO2 12/17/19 1120 97 %     Weight 12/17/19 1122 132 lb (59.9 kg)     Height 12/17/19 1122 5\' 3"  (1.6 m)     Head Circumference --      Peak Flow --      Pain Score 12/17/19 1120 0     Pain Loc --      Pain Edu? --      Excl. in GC? --     Constitutional: Alert and oriented.  No distress Eyes: Conjunctivae are normal. Normal extraocular movements. ENT      Head: Normocephalic and atraumatic.      Nose: No congestion/rhinnorhea.      Mouth/Throat: Mucous membranes are moist.      Neck: No stridor. Cardiovascular: Normal  rate, regular rhythm. No murmurs, rubs, or gallops. Respiratory: Normal respiratory effort without tachypnea nor retractions. Breath sounds are clear and equal bilaterally. No wheezes/rales/rhonchi. Gastrointestinal: Soft and nontender. Normal bowel sounds Musculoskeletal: Nontender with normal range of motion in extremities. No lower extremity tenderness nor edema. Neurologic:  Normal speech and language. No gross focal neurologic deficits are appreciated.  Skin:  Skin is warm, dry and intact. No rash noted. Psychiatric:  Depressed mood and affect ____________________________________________  ED COURSE:  As part of my medical decision making, I reviewed the following data within the [place] History obtained from family if available, nursing notes, old chart and ekg, as well as notes from prior ED visits. Patient presented for depression and suicidal ideation, we will assess with labs and imaging as indicated at this time.   Procedures  Angelica Robertson was evaluated in Emergency Department on 12/17/2019 for the symptoms described in the history of present illness. She was evaluated in the context of the global COVID-19 pandemic, which necessitated consideration that the patient might be at risk for infection with the SARS-CoV-2 virus that causes COVID-19. Institutional protocols and algorithms that pertain to the evaluation of patients at risk for COVID-19 are in a state of rapid change based on information released by regulatory bodies including the CDC and federal and state organizations. These policies and algorithms were followed during the patient's care in the ED.  ____________________________________________   LABS (pertinent positives/negatives)  Labs Reviewed  COMPREHENSIVE METABOLIC PANEL - Abnormal; Notable for the following components:      Result Value   Sodium 131 (*)    Chloride 97 (*)    Glucose, Bld 132 (*)    All other components within normal limits  SALICYLATE LEVEL - Abnormal; Notable for the following components:   Salicylate Lvl <0.0 (*)    All other components within normal limits  ACETAMINOPHEN LEVEL - Abnormal; Notable for the following components:   Acetaminophen (Tylenol), Serum 33 (*)    All other components within normal limits  CBC - Abnormal; Notable for the following components:   WBC 12.2 (*)    All other components within normal limits  ETHANOL  URINE DRUG SCREEN, QUALITATIVE ([facility] ONLY)   ___________________________________________   DIFFERENTIAL  DIAGNOSIS   Depression, suicidal ideation, hypothyroidism, dehydration, electrolyte abnormality  FINAL ASSESSMENT AND PLAN  Depression, suicidal ideation    Plan: The patient had presented for depression and suicidal thoughts. Patient's labs are grossly unremarkable.  She appears medically clear for psychiatric evaluation and disposition.   Laurence Aly, MD    Note: This note was generated in part or whole with voice recognition software. Voice recognition is usually quite accurate but there are transcription errors that can and very often do occur. I apologize for any typographical errors that were not detected and corrected.     Earleen Newport, MD 12/17/19 1236

## 2019-12-17 NOTE — Telephone Encounter (Signed)
I Called the pt and her husband answered the phone. He informed that pt is in [facility]. As per EMR, she is in [facility] for suicidal ideations.

## 2019-12-17 NOTE — ED Notes (Signed)
2 rings, earrings, hair clip, shirt shoes socks and pants given to pt husband.

## 2019-12-17 NOTE — Telephone Encounter (Signed)
Patients husband called in stating that pt is having suicidal thoughts and has been seeing psych. Started on buspar recently. Pt started having suicidal thought, shaking, blank stares, stating she did not want to live like this anymore. Pt has hx of depression. Husband contacted behavioral health where pt was in 2014 but was told referral was needed. Stated he has been trying to contact Dr Evelene Croon since Monday.  Advised husband that patient needed to be taken to the ED now. Husband agreed and stated that he would take her straight there. Husband gave me his cell phone number for me to follow up and confirm being evaluated.

## 2019-12-17 NOTE — BH Assessment (Signed)
Patient is to be admitted to [facility] by Dr. Cindi Carbon.  Attending Physician will be Dr. Jola Babinski.   Patient has been assigned to room 309, by [facility] Charge Nurse Gwen.   Intake Paper Work has been signed and placed on patient chart.   ER staff is aware of the admission:  Nitchia, ER Secretary    Dr. Fuller Plan, ER MD   Annette Stable, Patient's Nurse   Ethelene Browns, Patient Access.

## 2019-12-17 NOTE — Plan of Care (Signed)
New admission  Problem: Education: Goal: Knowledge of [facility] Education information/materials will improve Outcome: Not Progressing   Problem: Coping: Goal: Ability to verbalize frustrations and anger appropriately will improve Outcome: Not Progressing Goal: Ability to demonstrate self-control will improve Outcome: Not Progressing   Problem: Safety: Goal: Periods of time without injury will increase Outcome: Not Progressing   Problem: Activity: Goal: Interest or engagement in leisure activities will improve Outcome: Not Progressing Goal: Imbalance in normal sleep/wake cycle will improve Outcome: Not Progressing   Problem: Medication: Goal: Compliance with prescribed medication regimen will improve Outcome: Not Progressing

## 2019-12-17 NOTE — ED Triage Notes (Signed)
Pt states she is having suicidal thoughts , feeling anxious, tremors, not able to sleep. States her PCP changed her medications and added a new one, pt is here with her husband who is concerned about her.

## 2019-12-17 NOTE — Telephone Encounter (Signed)
pt was returning your call  

## 2019-12-17 NOTE — Progress Notes (Signed)
Admission Note: Report from [facility]  ER   D: Pt appeared depressed  With  a flat affect.  Pt  denies SI / AVH at this time. 74 year old  White female in under the services of Dr. Jola Babinski. Patient admitting  With Depression and suicidal  Ideation . Patient had been on low dose of Zoloft . Buspar added. Patient  Continue to voice of feeling  Bad   Patient  C. Feeling of  Suicidal ideation wake up wonting to hurt  self  . Voice of not being able to sleep at night , losing  Weight .  Voice of increased anxiety. Patient lives  With husband  Of 52 years ,  Pt is redirectable and cooperative with assessment.   Pt admitted to unit per protocol, skin assessment and search done and no contraband found with Arsenio Loader.  Pt  educated on therapeutic milieu rules. Pt was introduced to milieu by nursing staff.    Medical History:Hypercholesterolemial, Hyperlipemia Depression, Heart Murmur  Allergies:  No Know Allergies   R: Pt was receptive to education about the milieu .  15 min safety checks started. Clinical research associate offered support

## 2019-12-17 NOTE — Progress Notes (Addendum)
Patient came in today for B-12 injection in right deltoid. Patient tolerated well.   Reviewed.  Dr Scott 

## 2019-12-17 NOTE — Telephone Encounter (Signed)
Received this message this morning (I was out of office on 1/4 and 1/5). Called pt on her home phone and cell phone and there was no response. Will try again later. As per EMR, [facility] and with therapist Mr. Jenne Pane at 3 pm today.

## 2019-12-18 DIAGNOSIS — F332 Major depressive disorder, recurrent severe without psychotic features: Secondary | ICD-10-CM | POA: Diagnosis present

## 2019-12-18 LAB — HEPATIC FUNCTION PANEL
ALT: 20 U/L (ref 0–44)
AST: 22 U/L (ref 15–41)
Albumin: 4 g/dL (ref 3.5–5.0)
Alkaline Phosphatase: 49 U/L (ref 38–126)
Bilirubin, Direct: <0.1 mg/dL (ref 0.0–0.2)
Total Bilirubin: 0.5 mg/dL (ref 0.3–1.2)
Total Protein: 7 g/dL (ref 6.5–8.1)

## 2019-12-18 LAB — LIPID PANEL
Cholesterol: 167 mg/dL (ref 0–200)
HDL: 71 mg/dL (ref >40)
LDL Cholesterol: 78 mg/dL (ref 0–99)
Total CHOL/HDL Ratio: 2.4 RATIO
Triglycerides: 92 mg/dL (ref <150)
VLDL: 18 mg/dL (ref 0–40)

## 2019-12-18 LAB — TSH: TSH: 3.16 u[IU]/mL (ref 0.350–4.500)

## 2019-12-18 MED ORDER — DULOXETINE HCL 20 MG PO CPEP
40.0000 mg | ORAL_CAPSULE | Freq: Once | ORAL | Status: AC
  Administered 2019-12-19: 40 mg via ORAL
  Filled 2019-12-18: qty 2

## 2019-12-18 MED ORDER — MIRTAZAPINE 15 MG PO TABS
15.0000 mg | ORAL_TABLET | Freq: Every day | ORAL | Status: DC
  Administered 2019-12-18: 22:00:00 15 mg via ORAL
  Filled 2019-12-18: qty 1

## 2019-12-18 MED ORDER — DULOXETINE HCL 20 MG PO CPEP
20.0000 mg | ORAL_CAPSULE | Freq: Once | ORAL | Status: AC
  Administered 2019-12-18: 13:00:00 20 mg via ORAL
  Filled 2019-12-18: qty 1

## 2019-12-18 MED ORDER — SERTRALINE HCL 25 MG PO TABS
25.0000 mg | ORAL_TABLET | Freq: Once | ORAL | Status: AC
  Administered 2019-12-20: 01:00:00 25 mg via ORAL

## 2019-12-18 MED ORDER — ENSURE ENLIVE PO LIQD
237.0000 mL | Freq: Two times a day (BID) | ORAL | Status: DC
  Administered 2019-12-18 – 2019-12-19 (×4): 237 mL via ORAL

## 2019-12-18 MED ORDER — DULOXETINE HCL 30 MG PO CPEP
60.0000 mg | ORAL_CAPSULE | Freq: Every day | ORAL | Status: DC
  Administered 2019-12-20 – 2019-12-22 (×3): 60 mg via ORAL
  Filled 2019-12-18 (×3): qty 2

## 2019-12-18 MED ORDER — SERTRALINE HCL 25 MG PO TABS
50.0000 mg | ORAL_TABLET | Freq: Once | ORAL | Status: AC
  Administered 2019-12-19: 50 mg via ORAL
  Filled 2019-12-18: qty 2

## 2019-12-18 MED ORDER — ADULT MULTIVITAMIN W/MINERALS CH
1.0000 | ORAL_TABLET | Freq: Every day | ORAL | Status: DC
  Administered 2019-12-19 – 2019-12-22 (×4): 1 via ORAL
  Filled 2019-12-18 (×4): qty 1

## 2019-12-18 NOTE — Tx Team (Signed)
Interdisciplinary Treatment and Diagnostic Plan Update  12/18/2019 Time of Session: 9AM [name] MRN: [id number]  Principal Diagnosis: <principal problem not specified>  Secondary Diagnoses: Active Problems:   MDD (major depressive disorder)   Current Medications:  Current Facility-Administered Medications  Medication Dose Route Frequency Provider Last Rate Last Admin  . acetaminophen (TYLENOL) tablet 650 mg  650 mg Oral Q6H PRN Cristofano, Dorene Ar, MD      . alum & mag hydroxide-simeth (MAALOX/MYLANTA) 200-200-20 MG/5ML suspension 30 mL  30 mL Oral Q4H PRN Cristofano, Dorene Ar, MD      . aspirin EC tablet 81 mg  81 mg Oral Daily Cristofano, Dorene Ar, MD   81 mg at 12/18/19 0818  . calcium carbonate (TUMS - dosed in mg elemental calcium) chewable tablet 500 mg  500 mg Oral BID WC Cristofano, Dorene Ar, MD   500 mg at 12/18/19 0818  . clonazePAM (KLONOPIN) tablet 0.5 mg  0.5 mg Oral BID Cristofano, Dorene Ar, MD   0.5 mg at 12/18/19 0818  . feeding supplement (ENSURE ENLIVE) (ENSURE ENLIVE) liquid 237 mL  237 mL Oral BID BM Cristofano, Paul A, MD      . magnesium hydroxide (MILK OF MAGNESIA) suspension 30 mL  30 mL Oral Daily PRN Cristofano, Dorene Ar, MD      . Derrill Memo ON 12/19/2019] multivitamin with minerals tablet 1 tablet  1 tablet Oral Daily Cristofano, Paul A, MD      . sertraline (ZOLOFT) tablet 100 mg  100 mg Oral Daily Cristofano, Dorene Ar, MD   100 mg at 12/18/19 0818  . simvastatin (ZOCOR) tablet 20 mg  20 mg Oral q1800 Cristofano, Dorene Ar, MD   20 mg at 12/17/19 1825   PTA Medications: Medications Prior to Admission  Medication Sig Dispense Refill Last Dose  . aspirin EC 81 MG tablet Take 81 mg by mouth daily.     . busPIRone (BUSPAR) 5 MG tablet Take 1 tablet (5 mg total) by mouth 2 (two) times daily. 60 tablet 0   . calcium carbonate (OS-CAL) 600 MG TABS tablet Take 600 mg by mouth 2 (two) times daily with a meal.     . Calcium-Magnesium-Zinc 333-133-5 MG TABS Take by mouth.     .  Cholecalciferol (VITAMIN D PO) Take 250 mg by mouth 2 (two) times daily.     Marland Kitchen MELATONIN PO Take by mouth.     . Misc Natural Products (GLUCOSAMINE CHONDROITIN VIT D3 PO) Take 1 capsule by mouth.     . sertraline (ZOLOFT) 100 MG tablet Take 1 tablet (100 mg total) by mouth daily. 30 tablet 2   . simvastatin (ZOCOR) 20 MG tablet Take 1 tablet (20 mg total) by mouth daily. 90 tablet 1     Patient Stressors: Health problems Loss of weight Medication change or noncompliance  Patient Strengths: Ability for insight Active sense of humor Average or above average intelligence Capable of independent living Communication skills Motivation for treatment/growth Supportive family/friends  Treatment Modalities: Medication Management, Group therapy, Case management,  1 to 1 session with clinician, Psychoeducation, Recreational therapy.   Physician Treatment Plan for Primary Diagnosis: <principal problem not specified> Long Term Goal(s):     Short Term Goals:    Medication Management: Evaluate patient's response, side effects, and tolerance of medication regimen.  Therapeutic Interventions: 1 to 1 sessions, Unit Group sessions and Medication administration.  Evaluation of Outcomes: Progressing  Physician Treatment Plan for Secondary Diagnosis: Active Problems:   MDD (  major depressive disorder)  Long Term Goal(s):     Short Term Goals:       Medication Management: Evaluate patient's response, side effects, and tolerance of medication regimen.  Therapeutic Interventions: 1 to 1 sessions, Unit Group sessions and Medication administration.  Evaluation of Outcomes: Progressing   RN Treatment Plan for Primary Diagnosis: <principal problem not specified> Long Term Goal(s): Knowledge of disease and therapeutic regimen to maintain health will improve  Short Term Goals: Ability to demonstrate self-control, Ability to participate in decision making will improve, Ability to verbalize feelings  will improve and Ability to identify and develop effective coping behaviors will improve  Medication Management: RN will administer medications as ordered by provider, will assess and evaluate patient's response and provide education to patient for prescribed medication. RN will report any adverse and/or side effects to prescribing provider.  Therapeutic Interventions: 1 on 1 counseling sessions, Psychoeducation, Medication administration, Evaluate responses to treatment, Monitor vital signs and CBGs as ordered, Perform/monitor CIWA, COWS, AIMS and Fall Risk screenings as ordered, Perform wound care treatments as ordered.  Evaluation of Outcomes: Progressing   LCSW Treatment Plan for Primary Diagnosis: <principal problem not specified> Long Term Goal(s): Safe transition to appropriate next level of care at discharge, Engage patient in therapeutic group addressing interpersonal concerns.  Short Term Goals: Engage patient in aftercare planning with referrals and resources, Increase social support, Increase ability to appropriately verbalize feelings, Increase emotional regulation, Facilitate acceptance of mental health diagnosis and concerns and Increase skills for wellness and recovery  Therapeutic Interventions: Assess for all discharge needs, 1 to 1 time with Social worker, Explore available resources and support systems, Assess for adequacy in community support network, Educate family and significant other(s) on suicide prevention, Complete Psychosocial Assessment, Interpersonal group therapy.  Evaluation of Outcomes: Progressing   Progress in Treatment: Attending groups: Yes. Participating in groups: Yes. Taking medication as prescribed: Yes. Toleration medication: Yes. Family/Significant other contact made: No, will contact:  once permission is given. Patient understands diagnosis: Yes. Discussing patient identified problems/goals with staff: Yes. Medical problems stabilized or  resolved: Yes. Denies suicidal/homicidal ideation: Yes. Issues/concerns per patient self-inventory: No. Other: noe  New problem(s) identified: No, Describe:  none  New Short Term/Long Term Goal(s): detox, elimination of symptoms of psychosis, medication management for mood stabilization; elimination of SI thoughts; development of comprehensive mental wellness/sobriety plan.  Patient Goals:  "trying to get my medications correct, have an appetite, and be able to go to the grocery store and cook when I get home"  Discharge Plan or Barriers: Discharge plans and aftercare are still being assessed at this time.   Reason for Continuation of Hospitalization: Anxiety Depression Medication stabilization  Estimated Length of Stay: 1-7days  Attendees: Patient: Angelica Robertson 12/18/2019 10:04 AM  Physician: Dr. Jola Babinski 12/18/2019 10:04 AM  Nursing: Hulan Amato, RN 12/18/2019 10:04 AM  RN Care Manager: 12/18/2019 10:04 AM  Social Worker: Penni Homans, LCSW 12/18/2019 10:04 AM  Recreational Therapist: Garret Reddish, Drue Flirt, LRT 12/18/2019 10:04 AM  Other: Iris Pert, LCSW 12/18/2019 10:04 AM  Other: Lowella Dandy, LCSW 12/18/2019 10:04 AM  Other: 12/18/2019 10:04 AM    Scribe for Treatment Team: Harden Mo, LCSW 12/18/2019 10:04 AM

## 2019-12-18 NOTE — Plan of Care (Signed)
Patient rated her depression 5/10 and anxiety 6/10.Denies SI,HI and AVH.Pleasant and cooperative on approach.Stated that she could sleep better last night with Klonopin.Appetite fair and energy level good.Attended groups.Support and encouragement given.

## 2019-12-18 NOTE — Progress Notes (Signed)
Recreation Therapy Notes   Date: 12/18/2019  Time: 9:30 am  Location: Craft room  Behavioral response: Appropriate  Intervention Topic: Coping Skills   Discussion/Intervention:  Group content on today was focused on coping skills. The group defined what coping skills are and when they can be used. Individuals described how they normally cope with thing and the coping skills they normally use. Patients expressed why it is important to cope with things and how not coping with things can affect you. The group participated in the intervention "My coping box" and made coping boxes while adding coping skills they could use in the future to the box. Clinical Observations/Feedback:  Patient came to group late due to unknown reasons. She was pulled from group by PA student and never returned.  [name] LRT/CTRS         Angelica Robertson 12/18/2019 11:11 AM
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
## 2019-12-18 NOTE — Progress Notes (Signed)
Patient alert and oriented x 4, affect is flat but she brightens upon approach, her thoughts are organized, she denies SI/HI/AVH she appears less anxious interacting appropriately   with peers and staff. Patient was offered emotional support and encouragement, she was complaint with medication regimen, 15 minutes safety checks maintained will continue to monitor.

## 2019-12-18 NOTE — Progress Notes (Signed)
Initial Nutrition Assessment  DOCUMENTATION CODES:   Not applicable  INTERVENTION:   Ensure Enlive po BID, each supplement provides 350 kcal and 20 grams of protein  MVI daily   NUTRITION DIAGNOSIS:   Inadequate oral intake related to social / environmental circumstances as evidenced by per patient/family report.  GOAL:   Patient will meet greater than or equal to 90% of their needs  MONITOR:   PO intake, Supplement acceptance  REASON FOR ASSESSMENT:   Malnutrition Screening Tool    ASSESSMENT:   74 y.o. female with a history of depression, hyperlipidemia who presents to the ED for suicidal thoughts.   Pt with poor appetite and oral intake pta. RD will add supplements and MVI to help pt meet her estimated needs. Per chart, pt appears weight stable at baseline.   Medications reviewed and include: aspirin, tums  Labs reviewed:   Diet Order:   Diet Order            Diet regular Room service appropriate? Yes; Fluid consistency: Thin  Diet effective now             EDUCATION NEEDS:   No education needs have been identified at this time  Skin:  Skin Assessment: Reviewed RN Assessment  Last BM:  pta  Height:   Ht Readings from Last 1 Encounters:  12/17/19 5\' 3"  (1.6 m)    Weight:   Wt Readings from Last 1 Encounters:  12/17/19 60.3 kg    Ideal Body Weight:  52.3 kg  BMI:  Body mass index is 23.56 kg/m.  Estimated Nutritional Needs:   Kcal:  1400-1600kcal/day  Protein:  70-80g/day  Fluid:  >1.5L/day  02/14/20 MS, RD, LDN Pager #- [phone] Office#- [phone] After Hours Pager: [phone]

## 2019-12-18 NOTE — BHH Counselor (Signed)
Balance In Life 12/18/2019 1PM  Type of Therapy/Topic:  Group Therapy:  Balance in Life  Participation Level:  Active  Description of Group:   This group will address the concept of balance and how it feels and looks when one is unbalanced. Patients will be encouraged to process areas in their lives that are out of balance and identify reasons for remaining unbalanced. Facilitators will guide patients in utilizing problem-solving interventions to address and correct the stressor making their life unbalanced. Understanding and applying boundaries will be explored and addressed for obtaining and maintaining a balanced life. Patients will be encouraged to explore ways to assertively make their unbalanced needs known to significant others in their lives, using other group members and facilitator for support and feedback.  Therapeutic Goals: 1. Patient will identify two or more emotions or situations they have that consume much of in their lives. 2. Patient will identify signs/triggers that life has become out of balance:  3. Patient will identify two ways to set boundaries in order to achieve balance in their lives:  4. Patient will demonstrate ability to communicate their needs through discussion and/or role plays  Summary of Patient Progress: Actively and appropriately engaged in the group. Patient was able to provide support and validation to other group members.Patient practiced active listening when interacting with the facilitator and other group members. Patient demonstrated good insight and completed balance wheel activity. Patient discussed with group members who struggle with depression and discussed importance of having a strong support system.    Therapeutic Modalities:   Cognitive Behavioral Therapy Solution-Focused Therapy Assertiveness Training  Layth Cerezo Philip Aspen, LCSW
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
## 2019-12-18 NOTE — Progress Notes (Signed)
Recreation Therapy Notes  INPATIENT RECREATION THERAPY ASSESSMENT  Patient Details Name: Angelica Robertson MRN: [id number] DOB: November 27, 1946 Today's Date: 12/18/2019       Information Obtained From: Patient  Able to Participate in Assessment/Interview: Yes  Patient Presentation: Responsive  Reason for Admission (Per Patient): Active Symptoms  Patient Stressors:    Coping Skills:   Music, Meditate  Leisure Interests (2+):  Exercise - Running, Exercise - Walking, Games - Cards, Individual - Reading, [employer] - Travel (Comment)  Frequency of Recreation/Participation: Monthly  Awareness of Community Resources:  Yes  Community Resources:  Gym  Current Use:    If no, Barriers?:    Expressed Interest in [employer] Information:    [place] of Residence:  Film/video editor  Patient Main Form of Transportation: Set designer  Patient Strengths:  I am kind and sweet and loving  Patient Identified Areas of Improvement:  Get over this depression  Patient Goal for Hospitalization:  Getting my medication right and get better sleep and back to being myself.  Current SI (including self-harm):  No  Current HI:  No  Current AVH: No  Staff Intervention Plan: Group Attendance, Collaborate with Interdisciplinary Treatment Team  Consent to Intern Participation: N/A  Andi Layfield 12/18/2019, 3:02 PM

## 2019-12-18 NOTE — H&P (Signed)
Psychiatric Admission Assessment Adult  Patient Identification: Angelica Robertson MRN:  [id number] Date of Evaluation:  12/18/2019 Chief Complaint:  MDD (major depressive disorder) [F32.9] Principal Diagnosis: MDD (major depressive disorder), recurrent severe, without psychosis ([place]) Diagnosis:  Principal Problem:   MDD (major depressive disorder), recurrent severe, without psychosis ([place])  History of Present Illness: Angelica Robertson is a 74 year old female who presented to the [facility] emergency department yesterday for worsening depression and hopelessness. She states that she came to the emergency department instead of following up with her psychiatrist because she could not get in touch with her quickly and she was worried about her symptoms. She stated that she was diagnosed with depression in her thirties and has been on numerous medications, most recently Zoloft 50 mg PO daily for the past 6 years. She reports that she recognized her symptoms of depression returning at the end of October including loss of appetite, sleep disturbances, and anhedonia. Her Zoloft was increased to 75 mg for 2 months and was then increased to 100 mg last week. She reports that these increases have not helped her symptoms and that she is having side effects of "racing heart," nausea, sleep disturbances. She also reports increased anxiety. Due to a lack of resolution of her symptoms, her side effect burden, and her husband "worrying about her," she and her husband decided to come to the ED for further help.   Associated Signs/Symptoms: Depression Symptoms:  anhedonia, insomnia, fatigue, feelings of worthlessness/guilt, difficulty concentrating, hopelessness, recurrent thoughts of death, suicidal thoughts without plan, anxiety, loss of energy/fatigue, disturbed sleep, weight loss, (Hypo) Manic Symptoms: N/A Anxiety Symptoms:  Excessive Worry, Psychotic Symptoms:  N/A PTSD Symptoms: NA Total Time spent with  patient: 30 minutes  Past Psychiatric History: She was diagnosed with depression and anxiety "around 1985" and started taking Wellbutrin and Ativan, which worked for years until she decided to come off and relapsed. After her relapse, she was hospitalized and was placed on Zoloft 50 mg PO daily, which was successful in treating her depression until late October of 2020. As per HPI, her dose of Zoloft was increased when her symptoms returned. 1 week ago she was also started on Buspar. She states that she has not noticed improvement in depression symptoms since beginning that and it has contributed to her racing heart, nausea, lack of sleep, and "feeling bad."   Is the patient at risk to self? Yes.    Has the patient been a risk to self in the past 6 months? Yes.    Has the patient been a risk to self within the distant past? Yes.    Is the patient a risk to others? No.  Has the patient been a risk to others in the past 6 months? No.  Has the patient been a risk to others within the distant past? No.   Prior Inpatient Therapy:   Prior Outpatient Therapy:    Alcohol Screening: 1. How often do you have a drink containing alcohol?: Never 2. How many drinks containing alcohol do you have on a typical day when you are drinking?: 1 or 2 3. How often do you have six or more drinks on one occasion?: Never AUDIT-C Score: 0 4. How often during the last year have you found that you were not able to stop drinking once you had started?: Never 5. How often during the last year have you failed to do what was normally expected from you becasue of drinking?: Never 6. How often  during the last year have you needed a first drink in the morning to get yourself going after a heavy drinking session?: Never 7. How often during the last year have you had a feeling of guilt of remorse after drinking?: Never 8. How often during the last year have you been unable to remember what happened the night before because you had  been drinking?: Never 9. Have you or someone else been injured as a result of your drinking?: No 10. Has a relative or friend or a doctor or another health worker been concerned about your drinking or suggested you cut down?: No Alcohol Use Disorder Identification Test Final Score (AUDIT): 0 Alcohol Brief Interventions/Follow-up: AUDIT Score <7 follow-up not indicated Substance Abuse History in the last 12 months:  No. Consequences of Substance Abuse: NA Previous Psychotropic Medications: Yes  Psychological Evaluations: Yes  Past Medical History:  Past Medical History:  Diagnosis Date  . Depression 2000  . Heart murmur   . Hypercholesterolemia Osteoporosis  . Hyperlipemia     Past Surgical History:  Procedure Laterality Date  . ABDOMINAL HYSTERECTOMY  1980  . COLONOSCOPY WITH PROPOFOL N/A 03/05/2018   Procedure: COLONOSCOPY WITH PROPOFOL;  Surgeon: Toledo, [name], MD;  Location: [facility] ENDOSCOPY;  Service: Endoscopy;  Laterality: N/A;   Family History:  Family History  Problem Relation Age of Onset  . Heart disease Mother   . Diabetes Mother   . Heart disease Father   . Diabetes Father   . Colon cancer Other   . Congestive Heart Failure Brother   . Colon cancer Brother   . BRCA 1/2 Neg Hx   . Breast cancer Neg Hx   . Cowden syndrome Neg Hx   . DES usage Neg Hx   . Endometrial cancer Neg Hx   . Li-Fraumeni syndrome Neg Hx   . Ovarian cancer Neg Hx    Family Psychiatric  History: None reported Tobacco Screening: Have you used any form of tobacco in the last 30 days? (Cigarettes, Smokeless Tobacco, Cigars, and/or Pipes): No Social History:  Social History   Substance and Sexual Activity  Alcohol Use Yes  . Alcohol/week: 0.0 standard drinks   Comment: occasionally - socially during the summer a glass of wine or beer     Social History   Substance and Sexual Activity  Drug Use No    Additional Social History: Marital status: Married Number of Years Married:  52 What types of issues is patient dealing with in the relationship?: No stressors reported What is your sexual orientation?: Heterosexual Does patient have children?: Yes How many children?: 2 How is patient's relationship with their children?: Son-"excellent" daughter-"distant"                         Allergies:  No Known Allergies Lab Results:  Results for orders placed or performed during the hospital encounter of 12/17/19 (from the past 48 hour(s))  Hepatic function panel     Status: None   Collection Time: 12/18/19  6:31 AM  Result Value Ref Range   Total Protein 7.0 6.5 - 8.1 g/dL   Albumin 4.0 3.5 - 5.0 g/dL   AST 22 15 - 41 U/L   ALT 20 0 - 44 U/L   Alkaline Phosphatase 49 38 - 126 U/L   Total Bilirubin 0.5 0.3 - 1.2 mg/dL   Bilirubin, Direct <0.1 0.0 - 0.2 mg/dL   Indirect Bilirubin NOT CALCULATED 0.3 - 0.9 mg/dL  Comment: Performed at [facility], [address] [place], [place] [address]  Lipid panel     Status: None   Collection Time: 12/18/19  6:31 AM  Result Value Ref Range   Cholesterol 167 0 - 200 mg/dL   Triglycerides 92 <150 mg/dL   HDL 71 >40 mg/dL   Total CHOL/HDL Ratio 2.4 RATIO   VLDL 18 0 - 40 mg/dL   LDL Cholesterol 78 0 - 99 mg/dL    Comment:        Total Cholesterol/HDL:CHD Risk Coronary Heart Disease Risk Table                     Men   Women  1/2 Average Risk   3.4   3.3  Average Risk       5.0   4.4  2 X Average Risk   9.6   7.1  3 X Average Risk  23.4   11.0        Use the calculated Patient Ratio above and the CHD Risk Table to determine the patient's CHD Risk.        ATP III CLASSIFICATION (LDL):  <100     mg/dL   Optimal  100-129  mg/dL   Near or Above                    Optimal  130-159  mg/dL   Borderline  160-189  mg/dL   High  >190     mg/dL   Very High Performed at [facility], [address] [place], [place] [address]   TSH     Status: None   Collection Time: 12/18/19  6:31 AM  Result  Value Ref Range   TSH 3.160 0.350 - 4.500 uIU/mL    Comment: Performed by a 3rd Generation assay with a functional sensitivity of <=0.01 uIU/mL. Performed at [facility], [address] [place], [place] [address]     Blood Alcohol level:  Lab Results  Component Value Date   [facility] <10 12/17/2019   ETH <11 59/56/3875    Metabolic Disorder Labs:  Lab Results  Component Value Date   HGBA1C 6.1 06/02/2019   No results found for: PROLACTIN Lab Results  Component Value Date   CHOL 167 12/18/2019   TRIG 92 12/18/2019   HDL 71 12/18/2019   CHOLHDL 2.4 12/18/2019   VLDL 18 12/18/2019   LDLCALC 78 12/18/2019   LDLCALC 88 06/02/2019    Current Medications: Current Facility-Administered Medications  Medication Dose Route Frequency Provider Last Rate Last Admin  . acetaminophen (TYLENOL) tablet 650 mg  650 mg Oral Q6H PRN Cristofano, Dorene Ar, MD      . alum & mag hydroxide-simeth (MAALOX/MYLANTA) 200-200-20 MG/5ML suspension 30 mL  30 mL Oral Q4H PRN Cristofano, Dorene Ar, MD      . aspirin EC tablet 81 mg  81 mg Oral Daily Cristofano, Dorene Ar, MD   81 mg at 12/18/19 0818  . calcium carbonate (TUMS - dosed in mg elemental calcium) chewable tablet 500 mg  500 mg Oral BID WC Cristofano, Dorene Ar, MD   500 mg at 12/18/19 0818  . clonazePAM (KLONOPIN) tablet 0.5 mg  0.5 mg Oral BID Cristofano, Dorene Ar, MD   0.5 mg at 12/18/19 0818  . DULoxetine (CYMBALTA) DR capsule 20 mg  20 mg Oral Once Emileo Semel, Lowry Ram, FNP      . [START ON 12/19/2019] DULoxetine (CYMBALTA) DR capsule  40 mg  40 mg Oral Once Jozef Eisenbeis, Lowry Ram, FNP      . [START ON 12/20/2019] DULoxetine (CYMBALTA) DR capsule 60 mg  60 mg Oral Daily Ezrah Dembeck, Lowry Ram, FNP      . feeding supplement (ENSURE ENLIVE) (ENSURE ENLIVE) liquid 237 mL  237 mL Oral BID BM Cristofano, Paul A, MD      . magnesium hydroxide (MILK OF MAGNESIA) suspension 30 mL  30 mL Oral Daily PRN Cristofano, Paul A, MD      . mirtazapine (REMERON) tablet 15 mg  15 mg Oral  QHS Cayden Rautio, Lowry Ram, FNP      . [START ON 12/19/2019] multivitamin with minerals tablet 1 tablet  1 tablet Oral Daily Cristofano, Dorene Ar, MD      . Derrill Memo ON 12/20/2019] sertraline (ZOLOFT) tablet 25 mg  25 mg Oral Once Enez Monahan, Lowry Ram, FNP      . [START ON 12/19/2019] sertraline (ZOLOFT) tablet 50 mg  50 mg Oral Once Jerzy Crotteau, Lowry Ram, FNP      . simvastatin (ZOCOR) tablet 20 mg  20 mg Oral q1800 Cristofano, Dorene Ar, MD   20 mg at 12/17/19 1825   PTA Medications: Medications Prior to Admission  Medication Sig Dispense Refill Last Dose  . aspirin EC 81 MG tablet Take 81 mg by mouth daily.     . busPIRone (BUSPAR) 5 MG tablet Take 1 tablet (5 mg total) by mouth 2 (two) times daily. 60 tablet 0   . calcium carbonate (OS-CAL) 600 MG TABS tablet Take 600 mg by mouth 2 (two) times daily with a meal.     . Calcium-Magnesium-Zinc 333-133-5 MG TABS Take by mouth.     . Cholecalciferol (VITAMIN D PO) Take 250 mg by mouth 2 (two) times daily.     Marland Kitchen MELATONIN PO Take by mouth.     . Misc Natural Products (GLUCOSAMINE CHONDROITIN VIT D3 PO) Take 1 capsule by mouth.     . sertraline (ZOLOFT) 100 MG tablet Take 1 tablet (100 mg total) by mouth daily. 30 tablet 2   . simvastatin (ZOCOR) 20 MG tablet Take 1 tablet (20 mg total) by mouth daily. 90 tablet 1     Musculoskeletal: Strength & Muscle Tone: within normal limits Gait & Station: normal Patient leans: N/A  Psychiatric Specialty Exam: Physical Exam  Nursing note and vitals reviewed. Constitutional: She is oriented to person, place, and time. She appears well-developed and well-nourished.  Cardiovascular: Normal rate.  Respiratory: Effort normal.  Musculoskeletal:        General: Normal range of motion.  Neurological: She is alert and oriented to person, place, and time.  Skin: Skin is warm.    Review of Systems  Constitutional: Negative.   HENT: Negative.   Eyes: Negative.   Respiratory: Negative.   Cardiovascular: Negative.   Gastrointestinal:  Negative.   Genitourinary: Negative.   Musculoskeletal: Negative.   Skin: Negative.   Neurological: Negative.   Psychiatric/Behavioral: Positive for dysphoric mood, sleep disturbance and suicidal ideas. The patient is nervous/anxious.     Blood pressure 138/81, pulse 89, temperature 97.9 F (36.6 C), temperature source Oral, resp. rate 17, height 5' 3"  (1.6 m), weight 60.3 kg, last menstrual period 11/27/1979, SpO2 97 %.Body mass index is 23.56 kg/m.  General Appearance: Neat and Well Groomed  Eye Contact:  Fair  Speech:  Normal Rate  Volume:  Normal  Mood:  Anxious and Hopeless  Affect:  Congruent  Thought Process:  Coherent  Orientation:  Full (Time, Place, and Person)  Thought Content:  NA  Suicidal Thoughts:  Yes. Has thought about a plan but states that she has no intent to harm herself.   Homicidal Thoughts:  No  Memory:  Immediate;   Good Recent;   Fair Remote;   Fair  Judgement:  Fair  Insight:  Fair  Psychomotor Activity:  NA  Concentration:  Concentration: Good  Recall:  [place] of Knowledge:  Fair  Language:  Good  Akathisia:  Negative  Handed:  Right  AIMS (if indicated):     Assets:  Desire for Improvement Leisure Time Physical Health Resilience Talents/Skills  ADL's:  Intact  Cognition:  WNL  Sleep:  Number of Hours: 6.75    Treatment Plan Summary: Daily contact with patient to assess and evaluate symptoms and progress in treatment and Medication management. The patient suffers from depression and anxiety. 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 She states that she is unable to take Advil and Ibuprofen due to GI side effects. Due to these comorbidities, we will cross-taper her off of Zoloft and will begin Cymbalta. The taper will be Zoloft 100 mg PO today, 50 mg PO tomorrow, and 25 mg PO Saturday. Cymbalta will be 20 mg PO today, 40 mg PO tomorrow, and 60 mg PO Saturday. She will be  observed for any discontinuation syndrome symptoms and for side effects. Will monitor her blood pressure closely as she has high readings associated with her depression and anxiety, but does not have essential hypertension. Her last BP reading was 138/81.  Will start Remeron 15 mg PO QHS to assist with sleep, appetite and depression.  Observation Level/Precautions:  15 minute checks  Laboratory:  Reviewed  Psychotherapy:  Group therapy  Medications:  See above and MAR  Consultations:  As needed  Discharge Concerns:  Complaince  Estimated LOS: 3-5 Days  Other:     Physician Treatment Plan for Primary Diagnosis: MDD (major depressive disorder), recurrent severe, without psychosis ([place]) Long Term Goal(s): Improvement in symptoms so as ready for discharge  Short Term Goals: Ability to identify changes in lifestyle to reduce recurrence of condition will improve, Ability to verbalize feelings will improve, Ability to disclose and discuss suicidal ideas and Ability to demonstrate self-control will improve  Physician Treatment Plan for Secondary Diagnosis: Principal Problem:   MDD (major depressive disorder), recurrent severe, without psychosis ([place])  Long Term Goal(s): Improvement in symptoms so as ready for discharge  Short Term Goals: Ability to identify and develop effective coping behaviors will improve, Ability to maintain clinical measurements within normal limits will improve, Compliance with prescribed medications will improve and Ability to identify triggers associated with substance abuse/mental health issues will improve  I certify that inpatient services furnished can reasonably be expected to improve the patient's condition.    Lewis Shock, FNP 1/7/202111:36 AM

## 2019-12-18 NOTE — Plan of Care (Signed)
  Problem: Medication: Goal: Compliance with prescribed medication regimen will improve Outcome: Progressing  Patient is complaint with medication regimen.

## 2019-12-18 NOTE — [facility] Suicide Risk Assessment (Signed)
Angelica Robertson Admission Suicide Risk Assessment   Nursing information obtained from:  Angelica Robertson, Review of record Demographic factors:  Caucasian, Unemployed Current Mental Status:  Self-harm thoughts, Suicidal ideation indicated by Angelica Robertson Loss Factors:  Decline in physical health Historical Factors:  NA Risk Reduction Factors:  Living with another person, especially a relative, Positive social support, Positive therapeutic relationship, Positive coping skills or problem solving skills, Sense of responsibility to family, Religious beliefs about death  Total Time spent with Angelica Robertson: 30 minutes Principal Problem: MDD (major depressive disorder), recurrent severe, without psychosis ([facility]) Diagnosis:  Principal Problem:   MDD (major depressive disorder), recurrent severe, without psychosis ([facility])  Subjective Data: Angelica Robertson is seen and examined.  Angelica Robertson is a 74 year old female with a past psychiatric history significant for major depression as well as generalized anxiety disorder who presented to the [facility] emergency department on 12/16/2019 with worsening depression and anxiety.  The Angelica Robertson stated in the emergency department that things had not been going well for her.  Her mood and anxiety were worsening.  She really could not cite any specific stressor.  She had been attempting to contact her psychiatrist and therapist, and she had been taking Zoloft 50 mg p.o. daily for several years.  A primary care provider had tried to increase the dosage to 100 mg today, but she had significant side effects.  She really wanted to change medications.  She felt as though she became more frustrated over the fact that she could not it get in touch with her psychiatrist to make any changes.  She was having suicidal ideation in the emergency department, related that she has lost 9 pounds over the last 2 months, and was not sleeping well.  The decision was made to admit her to the hospital for evaluation and  stabilization.  Continued Clinical Symptoms:  Alcohol Use Disorder Identification Test Final Score (AUDIT): 0 The "Alcohol Use Disorders Identification Test", Guidelines for Use in Primary Care, Second Edition.  World Science writer [facility]). Score between 0-7:  no or low risk or alcohol related problems. Score between 8-15:  moderate risk of alcohol related problems. Score between 16-19:  high risk of alcohol related problems. Score 20 or above:  warrants further diagnostic evaluation for alcohol dependence and treatment.   CLINICAL FACTORS:   Severe Anxiety and/or Agitation Depression:   Anhedonia Hopelessness Impulsivity Insomnia   Musculoskeletal: Strength & Muscle Tone: within normal limits Gait & Station: normal Angelica Robertson leans: N/A  Psychiatric Specialty Exam: Physical Exam  Nursing note and vitals reviewed. Constitutional: She is oriented to person, place, and time. She appears well-developed and well-nourished.  HENT:  Head: Normocephalic and atraumatic.  Respiratory: Effort normal.  Neurological: She is alert and oriented to person, place, and time.    Review of Systems  Blood pressure 138/81, pulse 89, temperature 97.9 F (36.6 C), temperature source Oral, resp. rate 17, height 5\' 3"  (1.6 m), weight 60.3 kg, last menstrual period 11/27/1979, SpO2 97 %.Body mass index is 23.56 kg/m.  General Appearance: Casual  Eye Contact:  Fair  Speech:  Normal Rate  Volume:  Normal  Mood:  Anxious and Depressed  Affect:  Congruent  Thought Process:  Coherent and Descriptions of Associations: Intact  Orientation:  Full (Time, Place, and Person)  Thought Content:  Logical  Suicidal Thoughts:  Yes.  without intent/plan  Homicidal Thoughts:  No  Memory:  Immediate;   Good Recent;   Good Remote;   Good  Judgement:  Intact  Insight:  Fair  Psychomotor Activity:  Increased  Concentration:  Concentration: Fair and Attention Span: Fair  Recall:  [employer] of Knowledge:   Fair  Language:  Good  Akathisia:  Negative  Handed:  Right  AIMS (if indicated):     Assets:  Desire for Improvement Resilience  ADL's:  Intact  Cognition:  WNL  Sleep:  Number of Hours: 6.75      COGNITIVE FEATURES THAT CONTRIBUTE TO RISK:  None    SUICIDE RISK:   Mild:  Suicidal ideation of limited frequency, intensity, duration, and specificity.  There are no identifiable plans, no associated intent, mild dysphoria and related symptoms, good self-control (both objective and subjective assessment), few other risk factors, and identifiable protective factors, including available and accessible social support.  PLAN OF CARE: Angelica Robertson is seen and examined.  Angelica Robertson is a 74 year old female with the above-stated past psychiatric history who was admitted secondary to worsening depression, worsening anxiety and suicidal ideation.  She will be admitted to the hospital.  She will be integrated into the milieu.  She will be encouraged to attend groups and work on her coping skills.  We have discussed with her options with regard to treatment.  It is very clear that she is not satisfied with her outpatient psychiatrist, and does not believe that the Zoloft is effective.  We discussed with her some options.  She does have some musculoskeletal back pain, and discussed the possibility of Cymbalta.  She was started on Cymbalta 30 mg p.o. daily today, and this to be titrated during the course the hospitalization.  Additionally she will be weaned off the Zoloft.  We will drop her dose to 25 mg p.o. daily today, and then stop it in the next 2 days.  She will also be started on mirtazapine 15 mg p.o. nightly for anxiety and depression as well as sleep.  This should also stimulate her appetite.  Review of her laboratories revealed a mildly low sodium at 131, mildly elevated glucose at 132.  Normal lipids.  Normal renal function.  Normal liver function enzymes.  Essentially normal CBC with no evidence of anemia.   Her Tylenol level was 33, and salicylate was less than 7.  Her TSH was 3.160 and T4 was 0.99.  Blood alcohol was less than 10, drug screen was negative.  I certify that inpatient services furnished can reasonably be expected to improve the Angelica Robertson's condition.   Sharma Covert, MD 12/18/2019, 11:42 AM

## 2019-12-19 MED ORDER — MIRTAZAPINE 15 MG PO TABS
30.0000 mg | ORAL_TABLET | Freq: Every day | ORAL | Status: DC
  Administered 2019-12-19 – 2019-12-21 (×3): 30 mg via ORAL
  Filled 2019-12-19 (×3): qty 2

## 2019-12-19 NOTE — [facility] Group Notes (Signed)
LCSW Group Therapy Note  12/19/2019 1:00 PM  Type of Therapy and Topic:  Group Therapy:  Feelings around Relapse and Recovery  Participation Level:  Active   Description of Group:    Patients in this group will discuss emotions they experience before and after a relapse. They will process how experiencing these feelings, or avoidance of experiencing them, relates to having a relapse. Facilitator will guide patients to explore emotions they have related to recovery. Patients will be encouraged to process which emotions are more powerful. They will be guided to discuss the emotional reaction significant others in their lives may have to their relapse or recovery. Patients will be assisted in exploring ways to respond to the emotions of others without this contributing to a relapse.  Therapeutic Goals: 1. Patient will identify two or more emotions that lead to a relapse for them 2. Patient will identify two emotions that result when they relapse 3. Patient will identify two emotions related to recovery 4. Patient will demonstrate ability to communicate their needs through discussion and/or role plays   Summary of Patient Progress: Patient was present in group. Patient shared how she has a supportive team of family and peers with her.  She shared how she uses sleep, therapy and pets as coping skills. She shared that she relapsed on her mental health because her medications were no longer working.    Therapeutic Modalities:   Cognitive Behavioral Therapy Solution-Focused Therapy Assertiveness Training Relapse Prevention Therapy   Penni Homans, MSW, LCSW 12/19/2019 12:25 PM

## 2019-12-19 NOTE — Progress Notes (Signed)
Patient alert and oriented x 4, affect is flat but she brightens upon approach, her thoughts are organized and coherent she is interlacing appropriately with peers and staff, complaint with medication regimen and she attended evening wrap up group. 15 minutes safety checks maintained will continue to monitor.

## 2019-12-19 NOTE — Progress Notes (Signed)
Recreation Therapy Notes   Date: 12/19/2019  Time: 9:30 am  Location: Craft room  Behavioral response: Appropriate  Intervention Topic: Happiness    Discussion/Intervention:  Group content today was focused on Happiness. The group defined happiness and described where happiness comes from. Individuals identified what makes them happy and how they go about making others happy. Patients expressed things that stop them from being happy and ways they can improve their happiness. The group stated reasons why it is important to be happy. The group participated in the intervention "My Happiness", where they had a chance to identify and express things that make them happy. Clinical Observations/Feedback:  Patient came to group and stated her health impacts her happiness. She stated her husband makes her happy. Participant expressed that being depressed keeps her from being happy at times. Individual participated in the intervention during group and was social with peers and staff. Jimesha Rising LRT/CTRS         Jaquann Guarisco 12/19/2019 11:43 AM

## 2019-12-19 NOTE — Plan of Care (Signed)
Patient was little anxious and stated that she feels sedated this morning.Patient states " I never took these many medicines at a time."Patient is calm and cooperative on approach.Visible in the milieu.Appropriate with staff & peers.Appetite and energy level good.Support and encouragement given.

## 2019-12-19 NOTE — Progress Notes (Signed)
[facility] MD Progress Note  12/19/2019 10:45 AM Angelica Robertson  MRN:  [id number] Subjective: Patient is a 73-year-old female with a past psychiatric history significant for major depression as well as generalized anxiety disorder who presented to the [facility] emergency department on 12/16/2019 with worsening depression and anxiety.  Objective: Patient is seen and examined.  Patient is a 73-year-old female with the above-stated past psychiatric history who is seen in follow-up.  She remains anxious this morning.  She is somewhat tremulous.  She stated that this morning when she got up she was dizzy, and felt oversedated.  We discussed her previous issues with previous psychiatric medications.  I explained to her that the mirtazapine can do that initially, and that by increasing the dosage of the medicine it decreases the sedation.  We discussed increasing the dosage of the Cymbalta, increasing the dosage of the Remeron and decreasing the dosage of the Zoloft.  She remains anxious and tearful about any medication changes.  Her blood pressure remains elevated at 152/89.  She is afebrile.  Nursing notes reflect that she slept 6.75 hours last night.  Review of her laboratories and they were essentially normal.  She is not currently on any antihypertensives.  Review of her notes from her primary care physician did not show any previous antihypertensive agents.  Principal Problem: MDD (major depressive disorder), recurrent severe, without psychosis (HCC) Diagnosis: Principal Problem:   MDD (major depressive disorder), recurrent severe, without psychosis (HCC)  Total Time spent with patient: 20 minutes  Past Psychiatric History: See admission H&P  Past Medical History:  Past Medical History:  Diagnosis Date  . Depression 2000  . Heart murmur   . Hypercholesterolemia Osteoporosis  . Hyperlipemia     Past Surgical History:  Procedure Laterality Date  . ABDOMINAL HYSTERECTOMY  1980  .  COLONOSCOPY WITH PROPOFOL N/A 03/05/2018   Procedure: COLONOSCOPY WITH PROPOFOL;  Surgeon: Toledo, Teodoro K, MD;  Location: [facility] ENDOSCOPY;  Service: Endoscopy;  Laterality: N/A;   Family History:  Family History  Problem Relation Age of Onset  . Heart disease Mother   . Diabetes Mother   . Heart disease Father   . Diabetes Father   . Colon cancer Other   . Congestive Heart Failure Brother   . Colon cancer Brother   . BRCA 1/2 Neg Hx   . Breast cancer Neg Hx   . Cowden syndrome Neg Hx   . DES usage Neg Hx   . Endometrial cancer Neg Hx   . Li-Fraumeni syndrome Neg Hx   . Ovarian cancer Neg Hx    Family Psychiatric  History: See admission H&P Social History:  Social History   Substance and Sexual Activity  Alcohol Use Yes  . Alcohol/week: 0.0 standard drinks   Comment: occasionally - socially during the summer a glass of wine or beer     Social History   Substance and Sexual Activity  Drug Use No    Social History   Socioeconomic History  . Marital status: Married    Spouse name: robert  . Number of children: 2  . Years of education: Not on file  . Highest education level: Not on file  Occupational History  . Not on file  Tobacco Use  . Smoking status: Current Every Day Smoker    Types: Cigarettes  . Smokeless tobacco: Never Used  Substance and Sexual Activity  . Alcohol use: Yes    Alcohol/week: 0.0 standard drinks      Comment: occasionally - socially during the summer a glass of wine or beer  . Drug use: No  . Sexual activity: Yes  Other Topics Concern  . Not on file  Social History Narrative  . Not on file   Social Determinants of Health   Financial Resource Strain:   . Difficulty of Paying Living Expenses: Not on file  Food Insecurity:   . Worried About Running Out of Food in the Last Year: Not on file  . Ran Out of Food in the Last Year: Not on file  Transportation Needs:   . Lack of Transportation (Medical): Not on file  . Lack of Transportation  (Non-Medical): Not on file  Physical Activity: Sufficiently Active  . Days of Exercise per Week: 5 days  . Minutes of Exercise per Session: 30 min  Stress: Stress Concern Present  . Feeling of Stress : To some extent  Social Connections: Unknown  . Frequency of Communication with Friends and Family: Not on file  . Frequency of Social Gatherings with Friends and Family: Not on file  . Attends Religious Services: Not on file  . Active Member of Clubs or Organizations: Not on file  . Attends Club or Organization Meetings: Not on file  . Marital Status: Married   Additional Social History:                         Sleep: Fair  Appetite:  Fair  Current Medications: Current Facility-Administered Medications  Medication Dose Route Frequency Provider Last Rate Last Admin  . acetaminophen (TYLENOL) tablet 650 mg  650 mg Oral Q6H PRN Cristofano, Paul A, MD      . alum & mag hydroxide-simeth (MAALOX/MYLANTA) 200-200-20 MG/5ML suspension 30 mL  30 mL Oral Q4H PRN Cristofano, Paul A, MD      . aspirin EC tablet 81 mg  81 mg Oral Daily Cristofano, Paul A, MD   81 mg at 12/19/19 0815  . calcium carbonate (TUMS - dosed in mg elemental calcium) chewable tablet 500 mg  500 mg Oral BID WC Cristofano, Paul A, MD   500 mg at 12/18/19 1648  . clonazePAM (KLONOPIN) tablet 0.5 mg  0.5 mg Oral BID Cristofano, Paul A, MD   0.5 mg at 12/19/19 0815  . [START ON 12/20/2019] DULoxetine (CYMBALTA) DR capsule 60 mg  60 mg Oral Daily Money, Travis B, FNP      . feeding supplement (ENSURE ENLIVE) (ENSURE ENLIVE) liquid 237 mL  237 mL Oral BID BM Cristofano, Paul A, MD   237 mL at 12/19/19 1040  . magnesium hydroxide (MILK OF MAGNESIA) suspension 30 mL  30 mL Oral Daily PRN Cristofano, Paul A, MD      . mirtazapine (REMERON) tablet 30 mg  30 mg Oral QHS ,  Lawson, MD      . multivitamin with minerals tablet 1 tablet  1 tablet Oral Daily Cristofano, Paul A, MD   1 tablet at 12/19/19 0815  . [START ON  12/20/2019] sertraline (ZOLOFT) tablet 25 mg  25 mg Oral Once Money, Travis B, FNP      . simvastatin (ZOCOR) tablet 20 mg  20 mg Oral q1800 Cristofano, Paul A, MD   20 mg at 12/18/19 1850    Lab Results:  Results for orders placed or performed during the hospital encounter of 12/17/19 (from the past 48 hour(s))  Hepatic function panel     Status: None   Collection Time: 12/18/19    6:31 AM  Result Value Ref Range   Total Protein 7.0 6.5 - 8.1 g/dL   Albumin 4.0 3.5 - 5.0 g/dL   AST 22 15 - 41 U/L   ALT 20 0 - 44 U/L   Alkaline Phosphatase 49 38 - 126 U/L   Total Bilirubin 0.5 0.3 - 1.2 mg/dL   Bilirubin, Direct <0.1 0.0 - 0.2 mg/dL   Indirect Bilirubin NOT CALCULATED 0.3 - 0.9 mg/dL    Comment: Performed at [facility], [address] [place], [place] [address]  Lipid panel     Status: None   Collection Time: 12/18/19  6:31 AM  Result Value Ref Range   Cholesterol 167 0 - 200 mg/dL   Triglycerides 92 <150 mg/dL   HDL 71 >40 mg/dL   Total CHOL/HDL Ratio 2.4 RATIO   VLDL 18 0 - 40 mg/dL   LDL Cholesterol 78 0 - 99 mg/dL    Comment:        Total Cholesterol/HDL:CHD Risk Coronary Heart Disease Risk Table                     Men   Women  1/2 Average Risk   3.4   3.3  Average Risk       5.0   4.4  2 X Average Risk   9.6   7.1  3 X Average Risk  23.4   11.0        Use the calculated Patient Ratio above and the CHD Risk Table to determine the patient's CHD Risk.        ATP III CLASSIFICATION (LDL):  <100     mg/dL   Optimal  100-129  mg/dL   Near or Above                    Optimal  130-159  mg/dL   Borderline  160-189  mg/dL   High  >190     mg/dL   Very High Performed at [facility], [address] [place], [place] [address]   TSH     Status: None   Collection Time: 12/18/19  6:31 AM  Result Value Ref Range   TSH 3.160 0.350 - 4.500 uIU/mL    Comment: Performed by a 3rd Generation assay with a functional sensitivity of <=0.01 uIU/mL. Performed at   Hospital Lab, [address] [place], [place] [address]     Blood Alcohol level:  Lab Results  Component Value Date   ETH <10 12/17/2019   ETH <11 12/10/2012    Metabolic Disorder Labs: Lab Results  Component Value Date   HGBA1C 6.1 06/02/2019   No results found for: PROLACTIN Lab Results  Component Value Date   CHOL 167 12/18/2019   TRIG 92 12/18/2019   HDL 71 12/18/2019   CHOLHDL 2.4 12/18/2019   VLDL 18 12/18/2019   LDLCALC 78 12/18/2019   LDLCALC 88 06/02/2019    Physical Findings: AIMS:  , ,  ,  ,    CIWA:    COWS:     Musculoskeletal: Strength & Muscle Tone: within normal limits Gait & Station: normal Patient leans: N/A  Psychiatric Specialty Exam: Physical Exam  Nursing note and vitals reviewed. Constitutional: She is oriented to person, place, and time. She appears well-developed and well-nourished.  HENT:  Head: Normocephalic and atraumatic.  Respiratory: Effort normal.  Neurological: She is oriented to person, place, and time.    Review of Systems    Blood pressure (!) 152/89, pulse 81, temperature 98.2 F (36.8 C), temperature source Oral, resp. rate 17, height 5' 3" (1.6 m), weight 60.3 kg, last menstrual period 11/27/1979, SpO2 98 %.Body mass index is 23.56 kg/m.  General Appearance: Casual  Eye Contact:  Fair  Speech:  Normal Rate  Volume:  Decreased  Mood:  Anxious and Depressed  Affect:  Congruent  Thought Process:  Coherent and Descriptions of Associations: Intact  Orientation:  Full (Time, Place, and Person)  Thought Content:  Logical and Rumination  Suicidal Thoughts:  No  Homicidal Thoughts:  No  Memory:  Immediate;   Fair Recent;   Fair Remote;   Fair  Judgement:  Intact  Insight:  Fair  Psychomotor Activity:  Increased  Concentration:  Concentration: Fair and Attention Span: Fair  Recall:  Fair  Fund of Knowledge:  Good  Language:  Fair  Akathisia:  Negative  Handed:  Right  AIMS (if indicated):     Assets:   Desire for Improvement Housing Resilience  ADL's:  Intact  Cognition:  WNL  Sleep:  Number of Hours: 6.75     Treatment Plan Summary: Daily contact with patient to assess and evaluate symptoms and progress in treatment, Medication management and Plan : Patient is seen and examined.  Patient is a 73-year-old female with the above-stated past psychiatric history who is seen in follow-up.   Diagnosis: #1 major depression, recurrent, severe without psychotic features, #2 generalized anxiety disorder, #3 essential hypertension  Patient is seen in follow-up.  She remains essentially unchanged from yesterday.  She remains anxious and depressed.  We will continue the titration of her Cymbalta.  It goes to 40 mg p.o. daily today.  We will also continue her clonazepam at 0.5 mg p.o. twice daily for anxiety.  I will increase her mirtazapine to 30 mg p.o. nightly, and we will decrease her Zoloft to 25 mg a day today for 1 dose and then stop.  She remains mildly hypertensive, and we do not have an EKG on her right now.  I will order that today before deciding upon any particular antihypertensive agent.  1.  Continue coated aspirin 81 mg p.o. daily for heart health. 2.  Continue calcium carbonate for nutritional supplementation. 3.  Continue clonazepam 0.5 mg p.o. twice daily for anxiety. 4.  Increase Cymbalta to 40 mg p.o. daily for anxiety and depression. 5.  Increase mirtazapine to 30 mg p.o. nightly for mood, sleep and anxiety. 6.  Continue multivitamin 1 tablet p.o. daily for nutritional supplementation. 7.  Decrease sertraline to 25 mg p.o. daily for 1 day then stop, this is for anxiety and depression. 8.  Continue simvastatin 20 mg p.o. daily for hyperlipidemia. 9.  Get an EKG and review rhythm prior to antihypertensive agents. 10.  Disposition planning-in progress.   Lawson , MD 12/19/2019, 10:45 AM 

## 2019-12-20 NOTE — Plan of Care (Signed)
  Problem: Education: Goal: Knowledge of [facility] Education information/materials will improve Outcome: Progressing   Problem: Coping: Goal: Ability to verbalize frustrations and anger appropriately will improve Outcome: Progressing Goal: Ability to demonstrate self-control will improve Outcome: Progressing   Problem: Safety: Goal: Periods of time without injury will increase Outcome: Progressing   Problem: Self-Concept: Goal: Level of anxiety will decrease Outcome: Progressing   Problem: Medication: Goal: Compliance with prescribed medication regimen will improve Outcome: Progressing

## 2019-12-20 NOTE — Progress Notes (Signed)
Patient pleasant and cooperative. Visible in milieu, appropriate with staff and peers. Denies SI, HI, AVH. Medication compliant Reports here due to sleep variations. Reports doing a little better. Refuses ensure supplements, states eating meals well and does not need them. Appropriate with staff and peers. Encouragement and support offered. Safety checks maintained. Medications given as prescribed. Pt receptive and remains safe on unit with q 15 min checks.

## 2019-12-20 NOTE — Progress Notes (Signed)
Patient alert and oriented x 4, affect is flat but she brightens upon approach, she denies SI/HI/AVH,  her thoughts are organized and coherent she is interlacing appropriately with peers and staff, she rated depression a 5/10 ( 0 low - high 10) she was  complaint with medication regimen and she attended evening wrap up group.  Patient expressed to staff that she anxious she may not get enough sleep. Writer explained to patient that was given 30 mg of Remeron and she should be able to get some sleep.  15 minutes safety checks maintained will continue to monitor closely.

## 2019-12-20 NOTE — Progress Notes (Signed)
[facility] MD Progress Note  12/20/2019 10:38 AM Angelica Robertson  MRN:  [id number] Subjective:  Patient is a 74 year old female with a past psychiatric history significant for major depression as well as generalized anxiety disorder who presented to the [facility] emergency department on 12/16/2019 with worsening depression and anxiety.  Objective: Patient is seen and examined.  Patient is a 74 year old female with the above-stated past psychiatric history who is seen in follow-up.  She is a little bit better today.  Her tremulousness has decreased.  She slept better last night.  She believes now that the increase in the Remeron is okay because it is not made her oversedated.  She stated she was able to wake up this morning, not feel groggy, and not be unsteady on her feet.  Her transition from Zoloft to Cymbalta continues.  She received 25 mg p.o. daily of Zoloft today, and and we will stop that after this.  She received 60 mg of Cymbalta this morning.  She continues on the clonazepam 0.5 mg p.o. twice daily for anxiety.  She denied any suicidal or homicidal ideation.  No evidence of psychosis.  She admitted that she did feel better.  Her vital signs are stable, she is afebrile.  She slept 7.25 hours last night.  Principal Problem: MDD (major depressive disorder), recurrent severe, without psychosis ([place]) Diagnosis: Principal Problem:   MDD (major depressive disorder), recurrent severe, without psychosis ([place])  Total Time spent with patient: 20 minutes  Past Psychiatric History: See admission H&P  Past Medical History:  Past Medical History:  Diagnosis Date  . Depression 2000  . Heart murmur   . Hypercholesterolemia Osteoporosis  . Hyperlipemia     Past Surgical History:  Procedure Laterality Date  . ABDOMINAL HYSTERECTOMY  1980  . COLONOSCOPY WITH PROPOFOL N/A 03/05/2018   Procedure: COLONOSCOPY WITH PROPOFOL;  Surgeon: Toledo, [name], MD;  Location: [facility] ENDOSCOPY;  Service:  Endoscopy;  Laterality: N/A;   Family History:  Family History  Problem Relation Age of Onset  . Heart disease Mother   . Diabetes Mother   . Heart disease Father   . Diabetes Father   . Colon cancer Other   . Congestive Heart Failure Brother   . Colon cancer Brother   . BRCA 1/2 Neg Hx   . Breast cancer Neg Hx   . Cowden syndrome Neg Hx   . DES usage Neg Hx   . Endometrial cancer Neg Hx   . Li-Fraumeni syndrome Neg Hx   . Ovarian cancer Neg Hx    Family Psychiatric  History: See admission H&P Social History:  Social History   Substance and Sexual Activity  Alcohol Use Yes  . Alcohol/week: 0.0 standard drinks   Comment: occasionally - socially during the summer a glass of wine or beer     Social History   Substance and Sexual Activity  Drug Use No    Social History   Socioeconomic History  . Marital status: Married    Spouse name: robert  . Number of children: 2  . Years of education: Not on file  . Highest education level: Not on file  Occupational History  . Not on file  Tobacco Use  . Smoking status: Current Every Day Smoker    Types: Cigarettes  . Smokeless tobacco: Never Used  Substance and Sexual Activity  . Alcohol use: Yes    Alcohol/week: 0.0 standard drinks    Comment: occasionally - socially during the summer  a glass of wine or beer  . Drug use: No  . Sexual activity: Yes  Other Topics Concern  . Not on file  Social History Narrative  . Not on file   Social Determinants of Health   Financial Resource Strain:   . Difficulty of Paying Living Expenses: Not on file  Food Insecurity:   . Worried About Charity fundraiser in the Last Year: Not on file  . Ran Out of Food in the Last Year: Not on file  Transportation Needs:   . Lack of Transportation (Medical): Not on file  . Lack of Transportation (Non-Medical): Not on file  Physical Activity: Sufficiently Active  . Days of Exercise per Week: 5 days  . Minutes of Exercise per Session: 30 min   Stress: Stress Concern Present  . Feeling of Stress : To some extent  Social Connections: Unknown  . Frequency of Communication with Friends and Family: Not on file  . Frequency of Social Gatherings with Friends and Family: Not on file  . Attends Religious Services: Not on file  . Active Member of Clubs or Organizations: Not on file  . Attends Archivist Meetings: Not on file  . Marital Status: Married   Additional Social History:                         Sleep: Good  Appetite:  Fair  Current Medications: Current Facility-Administered Medications  Medication Dose Route Frequency Provider Last Rate Last Admin  . acetaminophen (TYLENOL) tablet 650 mg  650 mg Oral Q6H PRN Cristofano, Dorene Ar, MD      . alum & mag hydroxide-simeth (MAALOX/MYLANTA) 200-200-20 MG/5ML suspension 30 mL  30 mL Oral Q4H PRN Cristofano, Dorene Ar, MD      . aspirin EC tablet 81 mg  81 mg Oral Daily Cristofano, Dorene Ar, MD   81 mg at 12/20/19 4431  . calcium carbonate (TUMS - dosed in mg elemental calcium) chewable tablet 500 mg  500 mg Oral BID WC Cristofano, Dorene Ar, MD   500 mg at 12/18/19 1648  . clonazePAM (KLONOPIN) tablet 0.5 mg  0.5 mg Oral BID Cristofano, Dorene Ar, MD   0.5 mg at 12/20/19 5400  . DULoxetine (CYMBALTA) DR capsule 60 mg  60 mg Oral Daily Money, Lowry Ram, FNP   60 mg at 12/20/19 8676  . feeding supplement (ENSURE ENLIVE) (ENSURE ENLIVE) liquid 237 mL  237 mL Oral BID BM Cristofano, Dorene Ar, MD   237 mL at 12/19/19 1454  . magnesium hydroxide (MILK OF MAGNESIA) suspension 30 mL  30 mL Oral Daily PRN Cristofano, Paul A, MD      . mirtazapine (REMERON) tablet 30 mg  30 mg Oral QHS Sharma Covert, MD   30 mg at 12/19/19 2154  . multivitamin with minerals tablet 1 tablet  1 tablet Oral Daily Cristofano, Dorene Ar, MD   1 tablet at 12/20/19 [phone]  . simvastatin (ZOCOR) tablet 20 mg  20 mg Oral q1800 Cristofano, Dorene Ar, MD   20 mg at 12/19/19 1703    Lab Results: No results found for  this or any previous visit (from the past 48 hour(s)).  Blood Alcohol level:  Lab Results  Component Value Date   [facility] <10 12/17/2019   ETH <11 93/26/7124    Metabolic Disorder Labs: Lab Results  Component Value Date   HGBA1C 6.1 06/02/2019   No results found for: PROLACTIN Lab Results  Component Value Date   CHOL 167 12/18/2019   TRIG 92 12/18/2019   HDL 71 12/18/2019   CHOLHDL 2.4 12/18/2019   VLDL 18 12/18/2019   LDLCALC 78 12/18/2019   LDLCALC 88 06/02/2019    Physical Findings: AIMS:  , ,  ,  ,    CIWA:    COWS:     Musculoskeletal: Strength & Muscle Tone: within normal limits Gait & Station: normal Patient leans: N/A  Psychiatric Specialty Exam: Physical Exam  Nursing note and vitals reviewed. Constitutional: She is oriented to person, place, and time. She appears well-developed and well-nourished.  HENT:  Head: Normocephalic and atraumatic.  Respiratory: Effort normal.  Neurological: She is alert and oriented to person, place, and time.    Review of Systems  Blood pressure 137/75, pulse 88, temperature 98.3 F (36.8 C), temperature source Oral, resp. rate 18, height 5' 3"  (1.6 m), weight 60.3 kg, last menstrual period 11/27/1979, SpO2 99 %.Body mass index is 23.56 kg/m.  General Appearance: Casual  Eye Contact:  Good  Speech:  Normal Rate  Volume:  Normal  Mood:  Anxious and Depressed  Affect:  Congruent  Thought Process:  Coherent and Descriptions of Associations: Intact  Orientation:  Full (Time, Place, and Person)  Thought Content:  Logical  Suicidal Thoughts:  No  Homicidal Thoughts:  No  Memory:  Immediate;   Fair Recent;   Fair Remote;   Fair  Judgement:  Intact  Insight:  Fair  Psychomotor Activity:  Increased  Concentration:  Concentration: Good and Attention Span: Good  Recall:  Good  Fund of Knowledge:  Good  Language:  Good  Akathisia:  Negative  Handed:  Right  AIMS (if indicated):     Assets:  Desire for  Improvement Financial Resources/Insurance Housing Resilience Social Support Talents/Skills Transportation  ADL's:  Intact  Cognition:  WNL  Sleep:  Number of Hours: 7.25     Treatment Plan Summary: Daily contact with patient to assess and evaluate symptoms and progress in treatment, Medication management and Plan : Patient is seen and examined.  Patient is a 74 year old female with the above-stated past psychiatric history who is seen in follow-up.   Diagnosis: #1 major depression, recurrent, severe without psychotic features, #2 generalized anxiety disorder, #3 essential hypertension  Patient is seen in follow-up.  She is doing better today.  We continued to transition from Zoloft to Cymbalta.  No change in her current medications.  We still do not have an EKG, and I will rerequest that for today.  Fully she will continue to improve.  1.  Continue coated aspirin 81 mg p.o. daily for heart health. 2.  Continue calcium carbonate for nutritional supplementation. 3.  Continue clonazepam 0.5 mg p.o. twice daily for anxiety. 4.  Increase Cymbalta to 60 mg p.o. daily for anxiety and depression. 5.  Continue mirtazapine to 30 mg p.o. nightly for mood, sleep and anxiety. 6.  Continue multivitamin 1 tablet p.o. daily for nutritional supplementation. 7.  Decrease sertraline to 25 mg p.o. daily for 1 day then stop, this is for anxiety and depression. 8.  Continue simvastatin 20 mg p.o. daily for hyperlipidemia. 9.  Get an EKG and review rhythm prior to antihypertensive agents. 10.  Disposition planning-in progress.   Sharma Covert, MD 12/20/2019, 10:38 AM

## 2019-12-21 MED ORDER — CLONAZEPAM 0.5 MG PO TABS: 0.5000 mg | ORAL_TABLET | Freq: Two times a day (BID) | ORAL | Status: DC | PRN

## 2019-12-21 NOTE — [facility] Group Notes (Signed)
LCSW Aftercare Discharge Planning Group Note  12/21/2019  Type of Group and Topic: Psychoeducational Group: Discharge Planning  Participation Level: Active  Description of Group  Discharge planning group reviews patient's anticipated discharge plans and assists patients to anticipate and address any barriers to wellness/recovery in the community. Suicide prevention education is reviewed with patients in group.  Therapeutic Goals  1. Patients will state their anticipated discharge plan and mental health aftercare  2. Patients will identify potential barriers to wellness in the community setting  3. Patients will engage in problem solving, solution focused discussion of ways to anticipate and address barriers to wellness/recovery  Summary of Patient Progress  Plan for Discharge/Comments: Patient shared she expects to discharge home soon. She reports she is not established with outpatient providers for therapy and medication management, but would like to be referred.  Transportation Means: Has transportation.  Supports: Spouse  Therapeutic Modalities:  Motivational Interviewing  Darreld Mclean, [place]  12/21/2019 1:31 PM

## 2019-12-21 NOTE — Plan of Care (Signed)
  Problem: Education: Goal: Knowledge of [facility] Education information/materials will improve Outcome: Progressing  D: Patient has been calm and cooperative. Pleasant. Denies SI, HI and AVH.  A:continue to monitor for safety R: Safety maintained

## 2019-12-21 NOTE — Progress Notes (Signed)
D: Patient has been calm and cooperative. Pleasant. Denies SI, HI and AVH.  A:continue to monitor for safety R: Safety maintained

## 2019-12-21 NOTE — Progress Notes (Signed)
[facility] MD Progress Note  12/21/2019 10:59 AM [facility]  MRN:  [id number] Subjective:  Patient is a 74 year old female with a past psychiatric history significant for major depression as well as generalized anxiety disorder who presented to the [facility] department on 12/16/2019 with worsening depression and anxiety.  Objective: Patient is seen and examined.  Patient is a 74 year old female with the above-stated past psychiatric history who is seen in follow-up.  She is doing well.  She continued to sleep without lethargy in the a.m.  She denied any suicidal or homicidal ideation.  We discussed her medications.  We discussed the fact that discharge would probably be tomorrow, and that I wanted to change her clonazepam from standing to as needed today prior to discharge.  She is in agreement with that.  She denied any suicidal or homicidal ideation.  She stated her anxiety was better.  Her sleep remains good.  She denied any side effects that she was aware of her medications.  Her vital signs are stable, she is afebrile.  She slept 7.25 hours last night.  No new laboratories.  Principal Problem: MDD (major depressive disorder), recurrent severe, without psychosis ([place]) Diagnosis: Principal Problem:   MDD (major depressive disorder), recurrent severe, without psychosis ([place])  Total Time spent with patient: 15 minutes  Past Psychiatric History: See admission H&P  Past Medical History:  Past Medical History:  Diagnosis Date  . Depression 2000  . Heart murmur   . Hypercholesterolemia Osteoporosis  . Hyperlipemia     Past Surgical History:  Procedure Laterality Date  . ABDOMINAL HYSTERECTOMY  1980  . COLONOSCOPY WITH PROPOFOL N/A 03/05/2018   Procedure: COLONOSCOPY WITH PROPOFOL;  Surgeon: Toledo, [name], MD;  Location: [facility] ENDOSCOPY;  Service: Endoscopy;  Laterality: N/A;   Family History:  Family History  Problem Relation Age of Onset  . Heart disease  Mother   . Diabetes Mother   . Heart disease Father   . Diabetes Father   . Colon cancer Other   . Congestive Heart Failure Brother   . Colon cancer Brother   . BRCA 1/2 Neg Hx   . Breast cancer Neg Hx   . Cowden syndrome Neg Hx   . DES usage Neg Hx   . Endometrial cancer Neg Hx   . Li-Fraumeni syndrome Neg Hx   . Ovarian cancer Neg Hx    Family Psychiatric  History: See admission H&P Social History:  Social History   Substance and Sexual Activity  Alcohol Use Yes  . Alcohol/week: 0.0 standard drinks   Comment: occasionally - socially during the summer a glass of wine or beer     Social History   Substance and Sexual Activity  Drug Use No    Social History   Socioeconomic History  . Marital status: Married    Spouse name: robert  . Number of children: 2  . Years of education: Not on file  . Highest education level: Not on file  Occupational History  . Not on file  Tobacco Use  . Smoking status: Current Every Day Smoker    Types: Cigarettes  . Smokeless tobacco: Never Used  Substance and Sexual Activity  . Alcohol use: Yes    Alcohol/week: 0.0 standard drinks    Comment: occasionally - socially during the summer a glass of wine or beer  . Drug use: No  . Sexual activity: Yes  Other Topics Concern  . Not on file  Social History  Narrative  . Not on file   Social Determinants of Health   Financial Resource Strain:   . Difficulty of Paying Living Expenses: Not on file  Food Insecurity:   . Worried About Charity fundraiser in the Last Year: Not on file  . Ran Out of Food in the Last Year: Not on file  Transportation Needs:   . Lack of Transportation (Medical): Not on file  . Lack of Transportation (Non-Medical): Not on file  Physical Activity: Sufficiently Active  . Days of Exercise per Week: 5 days  . Minutes of Exercise per Session: 30 min  Stress: Stress Concern Present  . Feeling of Stress : To some extent  Social Connections: Unknown  . Frequency  of Communication with Friends and Family: Not on file  . Frequency of Social Gatherings with Friends and Family: Not on file  . Attends Religious Services: Not on file  . Active Member of Clubs or Organizations: Not on file  . Attends Archivist Meetings: Not on file  . Marital Status: Married   Additional Social History:                         Sleep: Good  Appetite:  Good  Current Medications: Current Facility-Administered Medications  Medication Dose Route Frequency Provider Last Rate Last Admin  . acetaminophen (TYLENOL) tablet 650 mg  650 mg Oral Q6H PRN Cristofano, Dorene Ar, MD      . alum & mag hydroxide-simeth (MAALOX/MYLANTA) 200-200-20 MG/5ML suspension 30 mL  30 mL Oral Q4H PRN Cristofano, Dorene Ar, MD      . aspirin EC tablet 81 mg  81 mg Oral Daily Cristofano, Dorene Ar, MD   81 mg at 12/21/19 0824  . calcium carbonate (TUMS - dosed in mg elemental calcium) chewable tablet 500 mg  500 mg Oral BID WC Cristofano, Dorene Ar, MD   500 mg at 12/18/19 1648  . clonazePAM (KLONOPIN) tablet 0.5 mg  0.5 mg Oral BID PRN Sharma Covert, MD      . DULoxetine (CYMBALTA) DR capsule 60 mg  60 mg Oral Daily Money, Lowry Ram, FNP   60 mg at 12/21/19 0824  . magnesium hydroxide (MILK OF MAGNESIA) suspension 30 mL  30 mL Oral Daily PRN Cristofano, Paul A, MD      . mirtazapine (REMERON) tablet 30 mg  30 mg Oral QHS Sharma Covert, MD   30 mg at 12/20/19 2111  . multivitamin with minerals tablet 1 tablet  1 tablet Oral Daily Cristofano, Dorene Ar, MD   1 tablet at 12/21/19 0824  . simvastatin (ZOCOR) tablet 20 mg  20 mg Oral q1800 Cristofano, Dorene Ar, MD   20 mg at 12/20/19 1726    Lab Results: No results found for this or any previous visit (from the past 48 hour(s)).  Blood Alcohol level:  Lab Results  Component Value Date   [facility] <10 12/17/2019   ETH <11 40/98/1191    Metabolic Disorder Labs: Lab Results  Component Value Date   HGBA1C 6.1 06/02/2019   No results found  for: PROLACTIN Lab Results  Component Value Date   CHOL 167 12/18/2019   TRIG 92 12/18/2019   HDL 71 12/18/2019   CHOLHDL 2.4 12/18/2019   VLDL 18 12/18/2019   LDLCALC 78 12/18/2019   LDLCALC 88 06/02/2019    Physical Findings: AIMS:  , ,  ,  ,    CIWA:  COWS:     Musculoskeletal: Strength & Muscle Tone: within normal limits Gait & Station: normal Patient leans: N/A  Psychiatric Specialty Exam: Physical Exam  Nursing note and vitals reviewed. Constitutional: She is oriented to person, place, and time. She appears well-developed and well-nourished.  HENT:  Head: Normocephalic and atraumatic.  Respiratory: Effort normal.  Neurological: She is alert and oriented to person, place, and time.    Review of Systems  Blood pressure 127/68, pulse 89, temperature 98.2 F (36.8 C), temperature source Oral, resp. rate 18, height _0  (1.6 m), weight 60.3 kg, last menstrual period 11/27/1979, SpO2 96 %.Body mass index is 23.56 kg/m.  General Appearance: Casual  Eye Contact:  Good  Speech:  Normal Rate  Volume:  Normal  Mood:  Euthymic  Affect:  Congruent  Thought Process:  Coherent and Descriptions of Associations: Intact  Orientation:  Full (Time, Place, and Person)  Thought Content:  Logical  Suicidal Thoughts:  No  Homicidal Thoughts:  No  Memory:  Immediate;   Good Recent;   Good Remote;   Good  Judgement:  Intact  Insight:  Good  Psychomotor Activity:  Normal  Concentration:  Concentration: Good and Attention Span: Good  Recall:  Good  Fund of Knowledge:  Good  Language:  Good  Akathisia:  Negative  Handed:  Right  AIMS (if indicated):     Assets:  Communication Skills Desire for Improvement Financial Resources/Insurance Housing Resilience Social Support Talents/Skills Transportation  ADL's:  Intact  Cognition:  WNL  Sleep:  Number of Hours: 7.25     Treatment Plan Summary: Daily contact with patient to assess and evaluate symptoms and progress in  treatment, Medication management and Plan : Patient is seen and examined.  Patient is a 74 year old female with the above-stated past psychiatric history who is seen in follow-up.   Diagnosis: #1 major depression, recurrent, severe without psychotic features, #2 generalized anxiety disorder, #3 essential hypertension  Patient is seen in follow-up.  She is doing well.  The only change she can make today is to change her clonazepam to as needed.  Hopefully things will continue to go well and she can be discharged tomorrow.  1. Continue coated aspirin 81 mg p.o. daily for heart health. 2. Continue calcium carbonate for nutritional supplementation. 3.  Change clonazepam 0.5 mg p.o. twice daily as needed for anxiety. 4.  Continue Cymbalta to 60 mg p.o. daily for anxiety and depression. 5.  Continue mirtazapine to 30 mg p.o. nightly for mood, sleep and anxiety. 6. Continue multivitamin 1 tablet p.o. daily for nutritional supplementation. 7. Zoloft has been stopped 8. Continue simvastatin 20 mg p.o. daily for hyperlipidemia. 9. Disposition planning-in progress. Sharma Covert, MD 12/21/2019, 10:59 AM

## 2019-12-21 NOTE — Progress Notes (Signed)
Patient pleasant and cooperative with care. Medication compliant. Appropriate with staff and peers. Denies SI, HI, AVH. Reports sleeping well last pm, appetite good and energy level normal. Rates depression 2/10, anxiety at 2/10. Patient goal today to continue to work at getting to go home.  Encouragement and support provided. Medications given as prescribed. Pt receptive and remains safe on unit with q 15 min checks.

## 2019-12-22 DIAGNOSIS — F332 Major depressive disorder, recurrent severe without psychotic features: Principal | ICD-10-CM

## 2019-12-22 MED ORDER — MIRTAZAPINE 30 MG PO TABS: 30.0000 mg | ORAL_TABLET | Freq: Every day | ORAL | 1 refills | Status: DC

## 2019-12-22 MED ORDER — CLONAZEPAM 0.5 MG PO TABS: 0.5000 mg | ORAL_TABLET | Freq: Two times a day (BID) | ORAL | 1 refills | Status: DC | PRN

## 2019-12-22 MED ORDER — SIMVASTATIN 20 MG PO TABS: 20.0000 mg | ORAL_TABLET | Freq: Every day | ORAL | 0 refills | Status: DC

## 2019-12-22 MED ORDER — ASPIRIN EC 81 MG PO TBEC: 81.0000 mg | DELAYED_RELEASE_TABLET | Freq: Every day | ORAL | 0 refills | Status: DC

## 2019-12-22 MED ORDER — DULOXETINE HCL 60 MG PO CPEP: 60.0000 mg | ORAL_CAPSULE | Freq: Every day | ORAL | 1 refills | Status: DC

## 2019-12-22 NOTE — Progress Notes (Signed)
Recreation Therapy Notes  INPATIENT RECREATION TR PLAN  Patient Details Name: [facility] MRN: [id number] DOB: 12-02-1946 Today's Date: 12/22/2019  Rec Therapy Plan Is patient appropriate for Therapeutic Recreation?: Yes Treatment times per week: At least 3 Estimated Length of Stay: 5-7 days TR Treatment/Interventions: Group participation (Comment)  Discharge Criteria Pt will be discharged from therapy if:: Discharged Treatment plan/goals/alternatives discussed and agreed upon by:: Patient/family  Discharge Summary Short term goals set: Patient will identify 3 positive coping skills to decrease depressive symptoms within 5 recreation therapy group sessions Short term goals met: Complete Progress toward goals comments: Groups attended Which groups?: Coping skills, Other (Comment)(Happiness) Reason goals not met: N/A Therapeutic equipment acquired: N/A Reason patient discharged from therapy: Discharge from hospital Pt/family agrees with progress & goals achieved: Yes Date patient discharged from therapy: 12/22/19   Jessina Marse 12/22/2019, 11:35 AM

## 2019-12-22 NOTE — Plan of Care (Signed)
  Problem: Education: Goal: Knowledge of [facility] Education information/materials will improve Outcome: Completed/Met  D: Patient has been pleasant and cooperative. Denies SI, HI and AVH A: Continue to monitor for safety R: Safety maintained.

## 2019-12-22 NOTE — Progress Notes (Signed)
  [facility] Adult Case Management Discharge Plan :  Will you be returning to the same living situation after discharge:  Yes,  Home At discharge, do you have transportation home?: Yes,  pts husband will pick her up Do you have the ability to pay for your medications: Yes,  [employer]  Release of information consent forms completed and in the chart;   Patient to Follow up at: Follow-up Information    [facility] Follow up.   Specialty: Behavioral Health Why: You are scheduled for a virtual visit with Perlie Mayo on Thursday, January 14th at 10am. You are scheduled for a virtual visit with Dr. Elna Breslow on Monday, January 25th at 4:45pm. Thank you. Contact information: [address] [facility] [place] [address] [phone]          Next level of care provider has access to [facility] Link:yes  Safety Planning and Suicide Prevention discussed: Yes,  SPE completed with pts husband  Have you used any form of tobacco in the last 30 days? (Cigarettes, Smokeless Tobacco, Cigars, and/or Pipes): No  Has patient been referred to the Quitline?: N/A patient is not a smoker  Patient has been referred for addiction treatment: N/A  Mechele Dawley, LCSW 12/22/2019, 8:59 AM

## 2019-12-22 NOTE — Progress Notes (Signed)
Recreation Therapy Notes  Date: 12/22/2019  Time: 9:30 am   Location: Craft room   Behavioral response: N/A   Intervention Topic: Necessities   Discussion/Intervention: Patient did not attend group.   Clinical Observations/Feedback:  Patient did not attend group.   Zarinah Oviatt LRT/CTRS         Carolos Fecher 12/22/2019 11:28 AM

## 2019-12-22 NOTE — Discharge Summary (Signed)
Physician Discharge Summary Note  Patient:  [facility] is an 74 y.o., female MRN:  [id number] DOB:  Dec 27, 1945 Patient phone:  [phone] (home)  Patient address:   [address] [place] [address],  Total Time spent with patient: 30 minutes  Date of Admission:  12/17/2019 Date of Discharge: December 22, 2019  Reason for Admission: Patient admitted to the hospital because of worsening depression and suicidal thoughts  Principal Problem: MDD (major depressive disorder), recurrent severe, without psychosis ([place]) Discharge Diagnoses: Principal Problem:   MDD (major depressive disorder), recurrent severe, without psychosis ([place])   Past Psychiatric History: History of recurrent depression  Past Medical History:  Past Medical History:  Diagnosis Date  . Depression 2000  . Heart murmur   . Hypercholesterolemia Osteoporosis  . Hyperlipemia     Past Surgical History:  Procedure Laterality Date  . ABDOMINAL HYSTERECTOMY  1980  . COLONOSCOPY WITH PROPOFOL N/A 03/05/2018   Procedure: COLONOSCOPY WITH PROPOFOL;  Surgeon: Toledo, [name], MD;  Location: [facility] ENDOSCOPY;  Service: Endoscopy;  Laterality: N/A;   Family History:  Family History  Problem Relation Age of Onset  . Heart disease Mother   . Diabetes Mother   . Heart disease Father   . Diabetes Father   . Colon cancer Other   . Congestive Heart Failure Brother   . Colon cancer Brother   . BRCA 1/2 Neg Hx   . Breast cancer Neg Hx   . Cowden syndrome Neg Hx   . DES usage Neg Hx   . Endometrial cancer Neg Hx   . Li-Fraumeni syndrome Neg Hx   . Ovarian cancer Neg Hx    Family Psychiatric  History: See previous Social History:  Social History   Substance and Sexual Activity  Alcohol Use Yes  . Alcohol/week: 0.0 standard drinks   Comment: occasionally - socially during the summer a glass of wine or beer     Social History   Substance and Sexual Activity  Drug Use No    Social History   Socioeconomic  History  . Marital status: Married    Spouse name: robert  . Number of children: 2  . Years of education: Not on file  . Highest education level: Not on file  Occupational History  . Not on file  Tobacco Use  . Smoking status: Current Every Day Smoker    Types: Cigarettes  . Smokeless tobacco: Never Used  Substance and Sexual Activity  . Alcohol use: Yes    Alcohol/week: 0.0 standard drinks    Comment: occasionally - socially during the summer a glass of wine or beer  . Drug use: No  . Sexual activity: Yes  Other Topics Concern  . Not on file  Social History Narrative  . Not on file   Social Determinants of Health   Financial Resource Strain:   . Difficulty of Paying Living Expenses: Not on file  Food Insecurity:   . Worried About Charity fundraiser in the Last Year: Not on file  . Ran Out of Food in the Last Year: Not on file  Transportation Needs:   . Lack of Transportation (Medical): Not on file  . Lack of Transportation (Non-Medical): Not on file  Physical Activity: Sufficiently Active  . Days of Exercise per Week: 5 days  . Minutes of Exercise per Session: 30 min  Stress: Stress Concern Present  . Feeling of Stress : To some extent  Social Connections: Unknown  .  Frequency of Communication with Friends and Family: Not on file  . Frequency of Social Gatherings with Friends and Family: Not on file  . Attends Religious Services: Not on file  . Active Member of Clubs or Organizations: Not on file  . Attends Archivist Meetings: Not on file  . Marital Status: Married    Hospital Course: Patient cooperative in the hospital.  Worked with treatment team on therapy and medication management.  Medicines were adjusted by primary psychiatrist to her current regimen of duloxetine and mirtazapine and clonazepam.  Patient reports feeling much better.  Did not show any dangerous aggressive or suicidal behavior in the hospital.  Patient is agreeable to plans for  outpatient treatment and has been referred to a new psychiatrist at her request.  Prescriptions prepared at discharge.  Patient is denying any suicidal thoughts and shows no sign of psychosis  Physical Findings: AIMS:  , ,  ,  ,    CIWA:    COWS:     Musculoskeletal: Strength & Muscle Tone: within normal limits Gait & Station: normal Patient leans: N/A  Psychiatric Specialty Exam: Physical Exam  Nursing note and vitals reviewed. Constitutional: She appears well-developed and well-nourished.  HENT:  Head: Normocephalic and atraumatic.  Eyes: Pupils are equal, round, and reactive to light. Conjunctivae are normal.  Cardiovascular: Regular rhythm and normal heart sounds.  Respiratory: Effort normal.  GI: Soft.  Musculoskeletal:        General: Normal range of motion.     Cervical back: Normal range of motion.  Neurological: She is alert.  Skin: Skin is warm and dry.  Psychiatric: She has a normal mood and affect. Her behavior is normal. Judgment and thought content normal.    Review of Systems  Constitutional: Negative.   HENT: Negative.   Eyes: Negative.   Respiratory: Negative.   Cardiovascular: Negative.   Gastrointestinal: Negative.   Musculoskeletal: Negative.   Skin: Negative.   Neurological: Negative.   Psychiatric/Behavioral: Negative.     Blood pressure 136/83, pulse 95, temperature 98.4 F (36.9 C), temperature source Oral, resp. rate 18, height 5' 3"  (1.6 m), weight 60.3 kg, last menstrual period 11/27/1979, SpO2 96 %.Body mass index is 23.56 kg/m.  General Appearance: Casual  Eye Contact:  Good  Speech:  Clear and Coherent  Volume:  Normal  Mood:  Euthymic  Affect:  Congruent  Thought Process:  Goal Directed  Orientation:  Full (Time, Place, and Person)  Thought Content:  Logical  Suicidal Thoughts:  No  Homicidal Thoughts:  No  Memory:  Immediate;   Fair Recent;   Fair Remote;   Fair  Judgement:  Fair  Insight:  Fair  Psychomotor Activity:  Normal   Concentration:  Concentration: Fair  Recall:  [employer] of Knowledge:  Fair  Language:  Fair  Akathisia:  No  Handed:  Right  AIMS (if indicated):     Assets:  Desire for Improvement Housing Physical Health Resilience Social Support  ADL's:  Intact  Cognition:  WNL  Sleep:  Number of Hours: 8     Have you used any form of tobacco in the last 30 days? (Cigarettes, Smokeless Tobacco, Cigars, and/or Pipes): No  Has this patient used any form of tobacco in the last 30 days? (Cigarettes, Smokeless Tobacco, Cigars, and/or Pipes) Yes, No  Blood Alcohol level:  Lab Results  Component Value Date   [facility] <10 12/17/2019   ETH <11 24/08/7352    Metabolic Disorder Labs:  Lab Results  Component Value Date   HGBA1C 6.1 06/02/2019   No results found for: PROLACTIN Lab Results  Component Value Date   CHOL 167 12/18/2019   TRIG 92 12/18/2019   HDL 71 12/18/2019   CHOLHDL 2.4 12/18/2019   VLDL 18 12/18/2019   LDLCALC 78 12/18/2019   LDLCALC 88 06/02/2019    See Psychiatric Specialty Exam and Suicide Risk Assessment completed by Attending Physician prior to discharge.  Discharge destination:  Home  Is patient on multiple antipsychotic therapies at discharge:  No   Has Patient had three or more failed trials of antipsychotic monotherapy by history:  No  Recommended Plan for Multiple Antipsychotic Therapies: NA  Discharge Instructions    Diet - low sodium heart healthy   Complete by: As directed    Increase activity slowly   Complete by: As directed      Allergies as of 12/22/2019   No Known Allergies     Medication List    STOP taking these medications   busPIRone 5 MG tablet Commonly known as: BUSPAR   Calcium-Magnesium-Zinc 333-133-5 MG Tabs   GLUCOSAMINE CHONDROITIN VIT D3 PO   MELATONIN PO   sertraline 100 MG tablet Commonly known as: ZOLOFT   VITAMIN D PO     TAKE these medications     Indication  aspirin EC 81 MG tablet Take 1 tablet (81 mg  total) by mouth daily.  Indication: Stable Angina Pectoris   calcium carbonate 600 MG Tabs tablet Commonly known as: OS-CAL Take 600 mg by mouth 2 (two) times daily with a meal.  Indication: Low Amount of Calcium in the Blood   clonazePAM 0.5 MG tablet Commonly known as: KLONOPIN Take 1 tablet (0.5 mg total) by mouth 2 (two) times daily as needed (anxiety).  Indication: Feeling Anxious   DULoxetine 60 MG capsule Commonly known as: CYMBALTA Take 1 capsule (60 mg total) by mouth daily. Start taking on: December 23, 2019  Indication: Major Depressive Disorder   mirtazapine 30 MG tablet Commonly known as: REMERON Take 1 tablet (30 mg total) by mouth at bedtime.  Indication: Major Depressive Disorder   simvastatin 20 MG tablet Commonly known as: ZOCOR Take 1 tablet (20 mg total) by mouth daily at 6 PM. What changed: when to take this  Indication: Nonfamilial Hypercholesterolemia      Follow-up Information    [facility] Follow up.   Specialty: Behavioral Health Why: You are scheduled for a virtual visit with [name] on Thursday, January 14th at [place] are scheduled for a virtual visit with Dr. Shea Evans on Monday, January 25th at 4:45pm. Thank you. Contact information: [address] [place] [phone]          Follow-up recommendations:  Activity:  Activity as tolerated Diet:  Regular diet Other:  Follow-up as recommended  Comments: Prescriptions given at discharge  Signed: Alethia Berthold, MD 12/22/2019, 9:17 AM

## 2019-12-22 NOTE — Progress Notes (Signed)
Patient denies SI/HI, denies A/V hallucinations. Patient verbalizes understanding of discharge instructions, follow up care and prescriptions. Patient given all belongings from [facility] locker. Patient escorted out by staff, transported by family. 

## 2019-12-22 NOTE — Progress Notes (Signed)
D: Patient has been pleasant and cooperative. Denies SI, HI and AVH A: Continue to monitor for safety R: Safety maintained 

## 2019-12-22 NOTE — BHH Suicide Risk Assessment (Signed)
[facility] Discharge Suicide Risk Assessment   Principal Problem: MDD (major depressive disorder), recurrent severe, without psychosis (HCC) Discharge Diagnoses: Principal Problem:   MDD (major depressive disorder), recurrent severe, without psychosis (HCC)   Total Time spent with patient: 30 minutes  Musculoskeletal: Strength & Muscle Tone: within normal limits Gait & Station: normal Patient leans: N/A  Psychiatric Specialty Exam: Review of Systems  Constitutional: Negative.   HENT: Negative.   Eyes: Negative.   Respiratory: Negative.   Cardiovascular: Negative.   Gastrointestinal: Negative.   Musculoskeletal: Negative.   Skin: Negative.   Neurological: Negative.   Psychiatric/Behavioral: Negative.     Blood pressure 136/83, pulse 95, temperature 98.4 F (36.9 C), temperature source Oral, resp. rate 18, height 5\' 3"  (1.6 m), weight 60.3 kg, last menstrual period 11/27/1979, SpO2 96 %.Body mass index is 23.56 kg/m.  General Appearance: Casual  Eye Contact::  Good  Speech:  Clear and Coherent409  Volume:  Normal  Mood:  Euthymic  Affect:  Congruent  Thought Process:  Coherent  Orientation:  Full (Time, Place, and Person)  Thought Content:  Logical  Suicidal Thoughts:  No  Homicidal Thoughts:  No  Memory:  Immediate;   Fair Recent;   Fair Remote;   Fair  Judgement:  Fair  Insight:  Fair  Psychomotor Activity:  Normal  Concentration:  Fair  Recall:  [phone] of Knowledge:Fair  Language: Fair  Akathisia:  No  Handed:  Right  AIMS (if indicated):     Assets:  Desire for Improvement Housing Physical Health Resilience Social Support  Sleep:  Number of Hours: 8  Cognition: WNL  ADL's:  Intact   Mental Status Per Nursing Assessment::   On Admission:  Self-harm thoughts, Suicidal ideation indicated by patient  Demographic Factors:  Age 40 or older and Caucasian  Loss Factors: NA  Historical Factors: NA  Risk Reduction Factors:   Sense of responsibility to  family, Religious beliefs about death, Living with another person, especially a relative, Positive social support and Positive therapeutic relationship  Continued Clinical Symptoms:  Depression:   Severe  Cognitive Features That Contribute To Risk:  None    Suicide Risk:  Minimal: No identifiable suicidal ideation.  Patients presenting with no risk factors but with morbid ruminations; may be classified as minimal risk based on the severity of the depressive symptoms  Follow-up Information    [facility] Follow up.   Specialty: Behavioral Health Why: You are scheduled for a virtual visit with 76 on Thursday, January 14th at 10am. You are scheduled for a virtual visit with Dr. January 16 on Monday, January 25th at 4:45pm. Thank you. Contact information: [address] [facility] [place] [phone]          Plan Of Care/Follow-up recommendations:  Activity:  Activity as tolerated Diet:  Regular diet Other:  Follow-up with outpatient treatment as directed  [phone], MD 12/22/2019, 9:12 AM

## 2019-12-23 ENCOUNTER — Other Ambulatory Visit: Payer: Self-pay

## 2019-12-23 ENCOUNTER — Telehealth: Payer: Self-pay

## 2019-12-23 ENCOUNTER — Ambulatory Visit (INDEPENDENT_AMBULATORY_CARE_PROVIDER_SITE_OTHER): Payer: Medicare PPO | Admitting: Internal Medicine

## 2019-12-23 ENCOUNTER — Telehealth: Payer: Self-pay | Admitting: Internal Medicine

## 2019-12-23 VITALS — BP 136/83 | Ht 64.0 in | Wt 134.0 lb

## 2019-12-23 DIAGNOSIS — E538 Deficiency of other specified B group vitamins: Secondary | ICD-10-CM | POA: Diagnosis not present

## 2019-12-23 DIAGNOSIS — R739 Hyperglycemia, unspecified: Secondary | ICD-10-CM

## 2019-12-23 DIAGNOSIS — M542 Cervicalgia: Secondary | ICD-10-CM | POA: Diagnosis not present

## 2019-12-23 DIAGNOSIS — E871 Hypo-osmolality and hyponatremia: Secondary | ICD-10-CM | POA: Diagnosis not present

## 2019-12-23 DIAGNOSIS — F419 Anxiety disorder, unspecified: Secondary | ICD-10-CM | POA: Diagnosis not present

## 2019-12-23 DIAGNOSIS — R030 Elevated blood-pressure reading, without diagnosis of hypertension: Secondary | ICD-10-CM | POA: Diagnosis not present

## 2019-12-23 DIAGNOSIS — E7800 Pure hypercholesterolemia, unspecified: Secondary | ICD-10-CM

## 2019-12-23 DIAGNOSIS — D72829 Elevated white blood cell count, unspecified: Secondary | ICD-10-CM

## 2019-12-23 DIAGNOSIS — F332 Major depressive disorder, recurrent severe without psychotic features: Secondary | ICD-10-CM | POA: Diagnosis not present

## 2019-12-23 NOTE — Telephone Encounter (Signed)
Transition Care Management Follow-up Telephone Call  Date of discharge and from where: 12/22/2019 from [facility]   How have you been since you were released from the hospital? Feeling ok, a little tired but better than when admitted  Any questions or concerns? No   Items Reviewed:  Did the pt receive and understand the discharge instructions provided? Yes   Medications obtained and verified? Yes   Any new allergies since your discharge? Yes  did not tolerate buspar  Dietary orders reviewed? Yes  Do you have support at home? Yes   Functional Questionnaire: (I = Independent and D = Dependent) ADLs: I  Bathing/Dressing- I  Meal Prep- I  Eating- I  Maintaining continence- I  Transferring/Ambulation- I  Managing Meds- I  Follow up appointments reviewed:   PCP Hospital f/u appt confirmed? Yes  Scheduled to see Dr Lorin Picket on 12/23/2019 @ 8:30am.  Specialist Hospital f/u appt confirmed? Yes  Scheduled to see Dr Elna Breslow on 01/05/20 @ 4:45pm.  Are transportation arrangements needed? No   If their condition worsens, is the pt aware to call PCP or go to the Emergency Dept.? Yes  Was the patient provided with contact information for the PCP's office or ED? Yes  Was to pt encouraged to call back with questions or concerns? Yes

## 2019-12-23 NOTE — Telephone Encounter (Signed)
Lm for pt to call back and schedule a non-fasting lab in 2wks and a follow up in 7wks

## 2019-12-23 NOTE — Progress Notes (Signed)
Patient ID: Angelica Robertson, female   DOB: [birth year], 74 y.o.   MRN: [id number]   Virtual Visit via video Note  This visit type was conducted due to national recommendations for restrictions regarding the COVID-19 pandemic (e.g. social distancing).  This format is felt to be most appropriate for this patient at this time.  All issues noted in this document were discussed and addressed.  No physical exam was performed (except for noted visual exam findings with Video Visits).   I connected with Shanasia Ibrahim by a video enabled telemedicine application and verified that I am speaking with the correct person using two identifiers. Location patient: home Location provider: work  Persons participating in the virtual visit: patient, provider  I discussed the limitations, risks, security and privacy concerns of performing an evaluation and management service by telephone and the availability of in person appointments.  The patient expressed understanding and agreed to proceed.   Reason for visit: hospital follow up   HPI: Was admitted 12/17/19 - 12/22/19 - with depression and suicidal thoughts.  Was diagnosed with major depressive disorder and zoloft changed to cymbalta.  Monitored.  buspar stopped.  She ws also started on remeron and clonazepam.  Was discharged to continue f/u with psychiatry.  Has f/u with her counselo [name]) 12/25/19 and f/u with Dr Lalla Brothers 01/05/20. She reports she is doing better.  Sleeping better.  Has good support at home.  Was going to PT for her neck.  Once above issues sorted through, may restart.  Trying to stay active.  No chest pain or sob reported.  Eating.      ROS: See pertinent positives and negatives per HPI.  Past Medical History:  Diagnosis Date  . Depression 2000  . Heart murmur   . Hypercholesterolemia Osteoporosis  . Hyperlipemia     Past Surgical History:  Procedure Laterality Date  . ABDOMINAL HYSTERECTOMY  1980  . COLONOSCOPY WITH PROPOFOL N/A 03/05/2018    Procedure: COLONOSCOPY WITH PROPOFOL;  Surgeon: Toledo, [name], MD;  Location: [facility] ENDOSCOPY;  Service: Endoscopy;  Laterality: N/A;    Family History  Problem Relation Age of Onset  . Heart disease Mother   . Diabetes Mother   . Heart disease Father   . Diabetes Father   . Colon cancer Other   . Congestive Heart Failure Brother   . Colon cancer Brother   . BRCA 1/2 Neg Hx   . Breast cancer Neg Hx   . Cowden syndrome Neg Hx   . DES usage Neg Hx   . Endometrial cancer Neg Hx   . Li-Fraumeni syndrome Neg Hx   . Ovarian cancer Neg Hx     SOCIAL HX: reviewed    Current Outpatient Medications:  .  aspirin EC 81 MG tablet, Take 1 tablet (81 mg total) by mouth daily., Disp: 30 tablet, Rfl: 0 .  calcium carbonate (OS-CAL) 600 MG TABS tablet, Take 600 mg by mouth 2 (two) times daily with a meal., Disp: , Rfl:  .  clonazePAM (KLONOPIN) 0.5 MG tablet, Take 1 tablet (0.5 mg total) by mouth 2 (two) times daily as needed (anxiety)., Disp: 30 tablet, Rfl: 1 .  DULoxetine (CYMBALTA) 60 MG capsule, Take 1 capsule (60 mg total) by mouth daily., Disp: 30 capsule, Rfl: 1 .  mirtazapine (REMERON) 30 MG tablet, Take 1 tablet (30 mg total) by mouth at bedtime., Disp: 30 tablet, Rfl: 1 .  simvastatin (ZOCOR) 20 MG tablet, Take 1 tablet (20 mg  total) by mouth daily at 6 PM., Disp: 30 tablet, Rfl: 0  EXAM:  GENERAL: alert, oriented, appears well and in no acute distress  HEENT: atraumatic, conjunttiva clear, no obvious abnormalities on inspection of external nose and ears  NECK: normal movements of the head and neck  LUNGS: on inspection no signs of respiratory distress, breathing rate appears normal, no obvious gross SOB, gasping or wheezing  CV: no obvious cyanosis  PSYCH/NEURO: pleasant and cooperative, no obvious depression or anxiety, speech and thought processing grossly intact  ASSESSMENT AND PLAN:  Discussed the following assessment and plan:  Anxiety Off buspar.  Doing better  off this medication.  On cymbalta, remeron and clonazepam.  Doing better.  Sleeping better.  Due to f/u with therapist and Dr Lalla Brothers as outpatient.    B12 deficiency Recheck B12 level with next labs.    Elevated blood pressure reading Previous elevation.  Appears to be doing some better now.  Follow pressures.  Follow metabolic panel.    Hypercholesterolemia On simvastatin.  Low cholesterol diet and exercise.  Follow lipid panel and liver function tests.    Hyperglycemia Low carb diet and exercise.  Follow met b anda 1c.    MDD (major depressive disorder), recurrent severe, without psychosis ([place]) Recently admitted as outlined.  Medications adjusted.  Off zoloft and buspar.  Now on cymbalta, remeron and clonazepam.  Sleeping better.  Feels some better.  Keep f/u with counselor and Dr Lalla Brothers.    Neck pain Saw neurosurgery.  Referred to Dr Sharlet Salina.  Recommended traction and physical therapy.  PT had to be postponed due to hospitalization.  Some better.  Follow.     Orders Placed This Encounter  Procedures  . Hemoglobin A1c    Standing Status:   Future    Standing Expiration Date:   12/22/2020  . CBC with Differential/Platelet    Standing Status:   Future    Standing Expiration Date:   12/22/2020  . Basic metabolic panel (future)    Standing Status:   Future    Standing Expiration Date:   12/22/2020  . B12    Standing Status:   Future    Standing Expiration Date:   12/22/2020    No orders of the defined types were placed in this encounter.    I discussed the assessment and treatment plan with the patient. The patient was provided an opportunity to ask questions and all were answered. The patient agreed with the plan and demonstrated an understanding of the instructions.   The patient was advised to call back or seek an in-person evaluation if the symptoms worsen or if the condition fails to improve as anticipated.   Einar Pheasant, MD

## 2019-12-25 ENCOUNTER — Other Ambulatory Visit: Payer: Self-pay

## 2019-12-25 ENCOUNTER — Ambulatory Visit (INDEPENDENT_AMBULATORY_CARE_PROVIDER_SITE_OTHER): Payer: Medicare PPO | Admitting: Licensed Clinical Social Worker

## 2019-12-25 DIAGNOSIS — F332 Major depressive disorder, recurrent severe without psychotic features: Secondary | ICD-10-CM | POA: Diagnosis not present

## 2019-12-25 NOTE — Progress Notes (Signed)
Virtual Visit via Video Note  I connected withSue Hesteron 1/14/21at 10:00amby [employer] Webex video meeting and verified that I am speaking with the correct person using two identifiers.  I discussed the limitations, risks, security and privacy concerns of performing an evaluation and management service by telephone and the availability of in person appointments. I also discussed with the patient that there may be a patient responsible charge related to this service. The patient expressed understanding and agreed to proceed.  I discussed the assessment and treatment plan with the patient. The patient was provided an opportunity to ask questions and all were answered. The patient agreed with the plan and demonstrated an understanding of the instructions.  The patient was advised to call back or seek an in-person evaluation if the symptoms worsen or if the condition fails to improve as anticipated.  I provided 45 minutes of non-face-to-face time during this encounter.   Shade Flood, LCSW, LCASA ________________________ THERAPIST PROGRESS NOTE  Session Time:10:00am - 10:45am  Participation Level:Active  Behavioral Response:Alert, neatly groomed, casually dressed, mixed anxious/depressed mood  Type of Therapy: Individual Therapy  Treatment Goals addressed:Depression and anxiety management; self-care routine; medication compliance   Interventions:CBT   Summary: Danicia Terhaar presented for virtual session today on time, alert, oriented x5, with no evidence or self-report of SI/HI or A/V H.  She reported emotional ratings of 7/10 for for depression, and 10/10 for anxiety.  She reported that the past week was difficult, because she experienced side effects of leg weakness and shakiness around 1/6, which she believes were related to her medication.  Siddhi reported that her mood was impacted by this, and she began thinking that she would be better off dead, so she spoke with her  husband, and he took her to the ED.  Raymonde reported that she was admitted and enjoyed her brief stay, which led to discontinuation of some meds, and she started Cymbalta, Remeron, and an anxiety medication as well.  Salina reported that these medications seem to be helping manage anxiety, and she is sleeping around 8-10 hours nightly now.  Gyselle participated in screening and scored an 11 today.  She declined recommendation of group therapy at this time due to cost, but acknowledged that she would consider it if symptoms do not alleviate in following weeks.  Nissi reported that her current routine consists of going to the gym for about 30-40 minutes each day, she got a haircut following discharge from the hospital, and she will talk with her husband about planning a trip to [facility] to lift her spirits soon.  Skiler reported that she has not been using relaxation techniques as much, but will begin practicing them again daily to avoid becoming dependent upon anxiety medication.  Syrah reported that she would utilize affirmations such as "I believe in myself", "I can get through this, I have before", and "Things will get better if I am patient with myself" to challenge negative thoughts.  Haruka reported that she would follow up in one week.         Suicidal/Homicidal: None, without plan or intent    Therapist Response: Clinician met with Collie Siad for virtual session via Webex application today.  Clinician assessed for safety.  Clinician inquired about Rylynne's current emotional ratings, as well as any significant changes in thoughts, feelings, and behaviors since last session.  Clinician inquired Rayna's recent hospitalization and events preceding this admission.  Clinician praised Ahlayah for reaching out to her husband for help in crisis situation  and encouraged her to continue to remain open and honest when faced with suicidal thoughts or feelings.  Clinician utilized PHQ9 screening to interview Connelly.  Clinician discussed possibility of  Tuwana stepping up to weekly group therapy as higher level of care given recent crisis.  Clinician encouraged Meiling to think about this recommendation as long as symptoms remain persistent. Clinician inquired about Pearlean's present self-care routine at this time, and revisited current coping skills taught in previous sessions, as well as use of positive affirmations to challenge distorted thinking and reduce anxiety and depression.  Clinician will continue to monitor.       Plan:Meet again in 1 week virtually.  Diagnosis: Major depressive disorder, recurrent, severe, without psychotic features   Shade Flood, LCSW, LCASA 12/25/19

## 2019-12-28 ENCOUNTER — Encounter: Payer: Self-pay | Admitting: Internal Medicine

## 2019-12-28 NOTE — Assessment & Plan Note (Signed)
Recently admitted as outlined.  Medications adjusted.  Off zoloft and buspar.  Now on cymbalta, remeron and clonazepam.  Sleeping better.  Feels some better.  Keep f/u with counselor and Dr Mariam Dollar.

## 2019-12-28 NOTE — Assessment & Plan Note (Signed)
Low carb diet and exercise.  Follow met b anda 1c.   

## 2019-12-28 NOTE — Assessment & Plan Note (Signed)
Saw neurosurgery.  Referred to Dr Yves Dill.  Recommended traction and physical therapy.  PT had to be postponed due to hospitalization.  Some better.  Follow.

## 2019-12-28 NOTE — Assessment & Plan Note (Signed)
Previous elevation.  Appears to be doing some better now.  Follow pressures.  Follow metabolic panel.

## 2019-12-28 NOTE — Assessment & Plan Note (Signed)
On simvastatin.  Low cholesterol diet and exercise.  Follow lipid panel and liver function tests.   

## 2019-12-28 NOTE — Assessment & Plan Note (Signed)
Recheck B12 level with next labs.  

## 2019-12-28 NOTE — Assessment & Plan Note (Signed)
Off buspar.  Doing better off this medication.  On cymbalta, remeron and clonazepam.  Doing better.  Sleeping better.  Due to f/u with therapist and Dr Mariam Dollar as outpatient.

## 2019-12-29 ENCOUNTER — Ambulatory Visit: Payer: Medicare Other | Admitting: Psychiatry

## 2019-12-31 ENCOUNTER — Other Ambulatory Visit: Payer: Self-pay

## 2020-01-05 ENCOUNTER — Ambulatory Visit: Payer: Self-pay | Admitting: Psychiatry

## 2020-01-06 ENCOUNTER — Other Ambulatory Visit: Payer: Self-pay

## 2020-01-06 ENCOUNTER — Other Ambulatory Visit (INDEPENDENT_AMBULATORY_CARE_PROVIDER_SITE_OTHER): Payer: Medicare PPO

## 2020-01-06 DIAGNOSIS — R739 Hyperglycemia, unspecified: Secondary | ICD-10-CM

## 2020-01-06 DIAGNOSIS — E538 Deficiency of other specified B group vitamins: Secondary | ICD-10-CM | POA: Diagnosis not present

## 2020-01-06 DIAGNOSIS — E871 Hypo-osmolality and hyponatremia: Secondary | ICD-10-CM | POA: Diagnosis not present

## 2020-01-06 DIAGNOSIS — D72829 Elevated white blood cell count, unspecified: Secondary | ICD-10-CM

## 2020-01-06 LAB — BASIC METABOLIC PANEL
BUN: 17 mg/dL (ref 6–23)
CO2: 30 mEq/L (ref 19–32)
Calcium: 10 mg/dL (ref 8.4–10.5)
Chloride: 102 mEq/L (ref 96–112)
Creatinine, Ser: 0.76 mg/dL (ref 0.40–1.20)
GFR: 74.93 mL/min (ref >60.00)
Glucose, Bld: 98 mg/dL (ref 70–99)
Potassium: 4.5 mEq/L (ref 3.5–5.1)
Sodium: 139 mEq/L (ref 135–145)

## 2020-01-06 LAB — CBC WITH DIFFERENTIAL/PLATELET
Basophils Absolute: 0.1 10*3/uL (ref 0.0–0.1)
Basophils Relative: 1 % (ref 0.0–3.0)
Eosinophils Absolute: 0.2 10*3/uL (ref 0.0–0.7)
Eosinophils Relative: 1.7 % (ref 0.0–5.0)
HCT: 41.5 % (ref 36.0–46.0)
Hemoglobin: 13.7 g/dL (ref 12.0–15.0)
Lymphocytes Relative: 19.5 % (ref 12.0–46.0)
Lymphs Abs: 1.7 10*3/uL (ref 0.7–4.0)
MCHC: 33.1 g/dL (ref 30.0–36.0)
MCV: 90.4 fl (ref 78.0–100.0)
Monocytes Absolute: 0.8 10*3/uL (ref 0.1–1.0)
Monocytes Relative: 8.7 % (ref 3.0–12.0)
Neutro Abs: 6.2 10*3/uL (ref 1.4–7.7)
Neutrophils Relative %: 69.1 % (ref 43.0–77.0)
Platelets: 374 10*3/uL (ref 150.0–400.0)
RBC: 4.59 Mil/uL (ref 3.87–5.11)
RDW: 13.2 % (ref 11.5–15.5)
WBC: 9 10*3/uL (ref 4.0–10.5)

## 2020-01-06 LAB — VITAMIN B12: Vitamin B-12: 869 pg/mL (ref 211–911)

## 2020-01-06 LAB — HEMOGLOBIN A1C: Hgb A1c MFr Bld: 6.4 % (ref 4.6–6.5)

## 2020-01-07 ENCOUNTER — Encounter: Payer: Self-pay | Admitting: Internal Medicine

## 2020-01-12 ENCOUNTER — Other Ambulatory Visit: Payer: Self-pay

## 2020-01-12 ENCOUNTER — Ambulatory Visit (INDEPENDENT_AMBULATORY_CARE_PROVIDER_SITE_OTHER): Payer: Medicare PPO | Admitting: Psychiatry

## 2020-01-12 ENCOUNTER — Encounter: Payer: Self-pay | Admitting: Psychiatry

## 2020-01-12 DIAGNOSIS — F411 Generalized anxiety disorder: Secondary | ICD-10-CM | POA: Insufficient documentation

## 2020-01-12 DIAGNOSIS — F332 Major depressive disorder, recurrent severe without psychotic features: Secondary | ICD-10-CM
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
## 2020-01-13 ENCOUNTER — Telehealth: Payer: Self-pay | Admitting: Psychiatry

## 2020-01-13 ENCOUNTER — Other Ambulatory Visit: Payer: Self-pay

## 2020-01-13 NOTE — Telephone Encounter (Signed)
Returned call to patient-she reports she wants to know how to come off of the Klonopin.  She is good about staying on the Klonopin.  Discussed with her that she was recently started on the Klonopin and she does not use it every day so she can slowly cut it back, take it only 4-5 times this week and next week she can cut it back to 2 or 3 times a week and then use it as needed.  She agrees with plan.

## 2020-01-14 ENCOUNTER — Ambulatory Visit (INDEPENDENT_AMBULATORY_CARE_PROVIDER_SITE_OTHER): Payer: Medicare PPO | Admitting: Licensed Clinical Social Worker

## 2020-01-14 DIAGNOSIS — F332 Major depressive disorder, recurrent severe without psychotic features: Secondary | ICD-10-CM

## 2020-01-14 DIAGNOSIS — F451 Undifferentiated somatoform disorder: Secondary | ICD-10-CM
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
## 2020-01-16 ENCOUNTER — Telehealth: Payer: Self-pay | Admitting: Psychiatry

## 2020-01-16 ENCOUNTER — Other Ambulatory Visit: Payer: Self-pay | Admitting: Psychiatry

## 2020-01-16 DIAGNOSIS — F332 Major depressive disorder, recurrent severe without psychotic features: Secondary | ICD-10-CM

## 2020-01-16 DIAGNOSIS — F411 Generalized anxiety disorder: Secondary | ICD-10-CM

## 2020-01-16 MED ORDER — DULOXETINE HCL 20 MG PO CPEP: 40.0000 mg | ORAL_CAPSULE | Freq: Every day | ORAL | 1 refills | Status: DC

## 2020-01-16 NOTE — Telephone Encounter (Signed)
Returned call to patient per request from Lea that pt needed a call back.  Patient reports she continues to struggle with headaches even though they are improving, she has it every day.  She also reports she continues to have night sweats.  She had it even the night that she took the Klonopin.  She has been trying to limit the use of Klonopin.  She is interested in reducing the dosage of Cymbalta. We will reduce Cymbalta to 40 mg p.o. daily Advised to continue Remeron 30 mg.  Patient advised to monitor her symptoms closely and any worsening depression or suicidality, advised to get help.

## 2020-01-20 ENCOUNTER — Ambulatory Visit: Payer: Medicare PPO

## 2020-01-21 ENCOUNTER — Encounter: Payer: Self-pay | Admitting: Internal Medicine

## 2020-01-22 ENCOUNTER — Other Ambulatory Visit: Payer: Self-pay

## 2020-01-28 ENCOUNTER — Other Ambulatory Visit: Payer: Self-pay

## 2020-01-28 ENCOUNTER — Ambulatory Visit (INDEPENDENT_AMBULATORY_CARE_PROVIDER_SITE_OTHER): Payer: Medicare PPO

## 2020-01-28 ENCOUNTER — Ambulatory Visit (INDEPENDENT_AMBULATORY_CARE_PROVIDER_SITE_OTHER): Payer: Medicare PPO | Admitting: Licensed Clinical Social Worker

## 2020-01-28 DIAGNOSIS — M62838 Other muscle spasm: Secondary | ICD-10-CM | POA: Diagnosis not present

## 2020-01-28 DIAGNOSIS — F332 Major depressive disorder, recurrent severe without psychotic features: Secondary | ICD-10-CM | POA: Diagnosis not present

## 2020-01-28 DIAGNOSIS — M5412 Radiculopathy, cervical region: Secondary | ICD-10-CM | POA: Diagnosis not present

## 2020-01-28 DIAGNOSIS — F451 Undifferentiated somatoform disorder: Secondary | ICD-10-CM

## 2020-01-28 DIAGNOSIS — M546 Pain in thoracic spine: Secondary | ICD-10-CM | POA: Diagnosis not present

## 2020-01-28 DIAGNOSIS — G8929 Other chronic pain: Secondary | ICD-10-CM | POA: Diagnosis not present

## 2020-01-28 DIAGNOSIS — M5489 Other dorsalgia: Secondary | ICD-10-CM | POA: Diagnosis not present

## 2020-01-28 DIAGNOSIS — E538 Deficiency of other specified B group vitamins: Secondary | ICD-10-CM

## 2020-01-28 DIAGNOSIS — M545 Low back pain: Secondary | ICD-10-CM | POA: Diagnosis not present

## 2020-01-28 DIAGNOSIS — M5030 Other cervical disc degeneration, unspecified cervical region: Secondary | ICD-10-CM | POA: Diagnosis not present

## 2020-01-28 MED ORDER — CYANOCOBALAMIN 1000 MCG/ML IJ SOLN
1000.0000 ug | Freq: Once | INTRAMUSCULAR | Status: AC
  Administered 2020-01-28: 1000 ug via INTRAMUSCULAR

## 2020-01-28 NOTE — Progress Notes (Addendum)
Patient presented for B 12 injection to left deltoid, patient voiced no concerns nor showed any signs of distress during injection.  Reviewed.  Dr Scott 

## 2020-01-28 NOTE — Progress Notes (Signed)
Virtual Visit via Video Note  I connected with Shaylene Paganelli 2/17/21at 2:00pmby [employer] Webex video meeting and verified that I am speaking with the correct person using two identifiers.  I discussed the limitations, risks, security and privacy concerns of performing an evaluation and management service by telephone and the availability of in person appointments. I also discussed with the patient that there may be a patient responsible charge related to this service. The patient expressed understanding and agreed to proceed.  I discussed the assessment and treatment plan with the patient. The patient was provided an opportunity to ask questions and all were answered. The patient agreed with the plan and demonstrated an understanding of the instructions.  The patient was advised to call back or seek an in-person evaluation if the symptoms worsen or if the condition fails to improve as anticipated.  I provided45 minutes of non-face-to-face time during this encounter.   Shade Flood, LCSW, LCAS _____________________________ THERAPIST PROGRESS NOTE  Session Time:2:00pm -2:45pm   Participation Level:Active  Behavioral Response:Alert, neatly groomed, casually dressed, euthymic mood  Type of Therapy: Individual Therapy  Treatment Goals addressed:Depression and anxiety management;self-care routine; medication compliance; Psychiatrist follow-up   Interventions:CBT, chair yoga   Summary: Flois Mctague presented for [facility] video session today.  She was alert, oriented x5, with no evidence or self-report of SI/HI or A/V H.  Chandrea reported emotional ratings of 0/10 for both depression anxiety today, stating "I feel more like myself each day and I'm proud of how far I've come".  Nakima reported that she did end up following up with her psychiatrist and this resulted in her Klonopin being discontinued, and her Cymbalta being reduced to 78m daily as she requested.  SPhylissreported that she  continues to have headaches which bother her, but their intensity seems to have decreased over the past 11 days since change was made.  SLeonardoreported that she will be following up with psychiatrist again tomorrow and plans to discuss possibility of ending her Remeron too.  SRosaliereported progress on engaging in daily self-care such as reading more regularly, cooking meals, and cleaning the house; exercising 4 days a week for roughly 1 hour each time at [employer] following a healthy diet which has kept sugar levels low; following up with physical therapy about possibility of reengaging twice per week; and using relaxation techniques as needed to maintain mood and sleep.  SOnniereported that she has become interested in engaging in yoga due to a friend's suggestion.  She participated in chair yoga today and reported that she enjoyed it and plans to integrate some form of yoga into weekly routine, and will do research in free time.  SJohannahreported that she would follow up in two weeks.         Suicidal/Homicidal: None, without plan or intent    Therapist Response: Clinician met with SCollie Siadfor virtual session via Webex application.  Clinician assessed for safety and medication compliance.  Clinician inquired about Rickelle's current emotional ratings, as well as any significant changes in thoughts, feelings, and behaviors since last session. Clinician inquired about Keeley's progress towards goals in previous weeks and any barriers to success that concern her.  Clinician praised SKayanafor her ongoing efforts and inquired about whether she would like to practice chair yoga today as a combined relaxation and exercise coping skill to add to self-care routine.  Clinician invited SAlisto find a comfortable space in her home, keep her feet flat, back straight, and maintain  a relaxed breathing pattern while performing entry-level poses such as hip circles, cow/cat pose, sun salutations, and more.  Clinician advised Tyeisha not to push  herself if she felt pain or was concerned that a specific position might aggravate her arthritis or any previous injuries.  Clinician will continue to monitor.     Plan:Meet again in 2 weeks virtually.  Diagnosis: Major depressive disorder,recurrent, severe, without psychotic features; Somatic symptom disorder, mild  Shade Flood, [place], LCAS 01/28/20

## 2020-01-29 ENCOUNTER — Ambulatory Visit: Payer: Medicare PPO | Admitting: Psychiatry

## 2020-01-29 ENCOUNTER — Ambulatory Visit: Payer: Medicare PPO

## 2020-02-06 DIAGNOSIS — M545 Low back pain: Secondary | ICD-10-CM | POA: Diagnosis not present

## 2020-02-06 DIAGNOSIS — M542 Cervicalgia: Secondary | ICD-10-CM | POA: Diagnosis not present

## 2020-02-06 DIAGNOSIS — M546 Pain in thoracic spine: Secondary | ICD-10-CM | POA: Diagnosis not present

## 2020-02-10 ENCOUNTER — Telehealth: Payer: Self-pay | Admitting: Internal Medicine

## 2020-02-10 ENCOUNTER — Encounter: Payer: Self-pay | Admitting: Internal Medicine

## 2020-02-10 ENCOUNTER — Other Ambulatory Visit: Payer: Self-pay | Admitting: Psychiatry

## 2020-02-10 ENCOUNTER — Telehealth (INDEPENDENT_AMBULATORY_CARE_PROVIDER_SITE_OTHER): Payer: Medicare PPO | Admitting: Internal Medicine

## 2020-02-10 ENCOUNTER — Other Ambulatory Visit: Payer: Self-pay

## 2020-02-10 DIAGNOSIS — F3341 Major depressive disorder, recurrent, in partial remission: Secondary | ICD-10-CM | POA: Diagnosis not present

## 2020-02-10 DIAGNOSIS — F419 Anxiety disorder, unspecified: Secondary | ICD-10-CM

## 2020-02-10 DIAGNOSIS — E7800 Pure hypercholesterolemia, unspecified: Secondary | ICD-10-CM | POA: Diagnosis not present

## 2020-02-10 DIAGNOSIS — M542 Cervicalgia: Secondary | ICD-10-CM | POA: Diagnosis not present

## 2020-02-10 DIAGNOSIS — R739 Hyperglycemia, unspecified: Secondary | ICD-10-CM | POA: Diagnosis not present

## 2020-02-10 DIAGNOSIS — F411 Generalized anxiety disorder: Secondary | ICD-10-CM

## 2020-02-10 DIAGNOSIS — F332 Major depressive disorder, recurrent severe without psychotic features: Secondary | ICD-10-CM

## 2020-02-10 NOTE — Progress Notes (Signed)
Patient ID: Angelica Robertson, female   DOB: [birth year], 74 y.o.   MRN: [id number]   Virtual Visit via video Note  This visit type was conducted due to national recommendations for restrictions regarding the COVID-19 pandemic (e.g. social distancing).  This format is felt to be most appropriate for this patient at this time.  All issues noted in this document were discussed and addressed.  No physical exam was performed (except for noted visual exam findings with Video Visits).   I connected with Gabryela Kimbrell by a video enabled telemedicine application and verified that I am speaking with the correct person using two identifiers. Location patient: home Location provider: work Persons participating in the virtual visit: patient, provider  The limitations, risks, security and privacy concerns of performing an evaluation and management service by video and the availability of in person appointments have been discussed.  The patient expressed understanding and agreed to proceed.   Reason for visit: scheduled follow up.    HPI: Has been having issues with depression. Seeing psychiatry.  Was on clonazepam.  This has been stopped.  On cymbalta now.  Also with acute on chronic cervical spine pain.  Had MRI.  Reviewed.  Seeing Dr Sharlet Salina.  Recommended continuing tylenol and referred to physical therapy.  She has started exercising more.  States will occasionally notice a mild heart flutter.  Only lasts 10-15 seconds.  Does not occur when she is exercising.  No chest pain or sob.  No acid reflux or abdominal pain reported.  Blood pressures higher in the am 130s/<80.  Goes down in the afternoon.  Evenings - <120/60s.  Feels much better.  Planning to go to the beach.     ROS: See pertinent positives and negatives per HPI.  Past Medical History:  Diagnosis Date  . Depression 2000  . Heart murmur   . Hypercholesterolemia Osteoporosis  . Hyperlipemia     Past Surgical History:  Procedure Laterality Date  .  ABDOMINAL HYSTERECTOMY  1980  . COLONOSCOPY WITH PROPOFOL N/A 03/05/2018   Procedure: COLONOSCOPY WITH PROPOFOL;  Surgeon: Toledo, [name], MD;  Location: [facility] ENDOSCOPY;  Service: Endoscopy;  Laterality: N/A;    Family History  Problem Relation Age of Onset  . Heart disease Mother   . Diabetes Mother   . Heart disease Father   . Diabetes Father   . Colon cancer Other   . Congestive Heart Failure Brother   . Colon cancer Brother   . Mental illness Paternal Aunt   . BRCA 1/2 Neg Hx   . Breast cancer Neg Hx   . Cowden syndrome Neg Hx   . DES usage Neg Hx   . Endometrial cancer Neg Hx   . Li-Fraumeni syndrome Neg Hx   . Ovarian cancer Neg Hx     SOCIAL HX: reviewed.    Current Outpatient Medications:  .  aspirin EC 81 MG tablet, Take 1 tablet (81 mg total) by mouth daily., Disp: 30 tablet, Rfl: 0 .  calcium carbonate (OS-CAL) 600 MG TABS tablet, Take 600 mg by mouth 2 (two) times daily with a meal., Disp: , Rfl:  .  Glucosamine-Chondroitin 250-200 MG CAPS, Take 1 tablet by mouth daily. TAKES 25 MCG, Disp: , Rfl:  .  mirtazapine (REMERON) 30 MG tablet, TAKE 1 TABLET BY MOUTH AT BEDTIME, Disp: 30 tablet, Rfl: 1 .  simvastatin (ZOCOR) 20 MG tablet, Take 1 tablet (20 mg total) by mouth daily at 6 PM., Disp: 30 tablet, Rfl:  0 .  DULoxetine (CYMBALTA) 20 MG capsule, TAKE 2 CAPSULES BY MOUTH ONCE DAILY, Disp: 60 capsule, Rfl: 1  EXAM:  GENERAL: alert, oriented, appears well and in no acute distress  HEENT: atraumatic, conjunttiva clear, no obvious abnormalities on inspection of external nose and ears  NECK: normal movements of the head and neck  LUNGS: on inspection no signs of respiratory distress, breathing rate appears normal, no obvious gross SOB, gasping or wheezing  CV: no obvious cyanosis  PSYCH/NEURO: pleasant and cooperative, no obvious depression or anxiety, speech and thought processing grossly intact  ASSESSMENT AND PLAN:  Discussed the following assessment and  plan:  Anxiety Seeing psychiatry.  On cymbalta.  Off clonazepam.  Followed by psychiatry. Doing better.   Cervicalgia Neck pain has improved.  Exercising.  Follow.    Depression Doing much better. Seeing Dr Shea Evans. Off clonazepam.  On cymbalta.  Feels better.  Continue f/u with psychiatry.   Hypercholesterolemia On simvastatin.  Low cholesterol diet and exercise.  Follow lipid panel and liver function tests.    Hyperglycemia Low carb diet and exercise.  Follow met b and a1c.    Orders Placed This Encounter  Procedures  . Hepatic function panel    Standing Status:   Future    Standing Expiration Date:   02/14/2021  . Hemoglobin A1c    Standing Status:   Future    Standing Expiration Date:   02/14/2021  . Lipid panel    Standing Status:   Future    Standing Expiration Date:   02/14/2021  . Basic metabolic panel    Standing Status:   Future    Standing Expiration Date:   02/14/2021    I discussed the assessment and treatment plan with the patient. The patient was provided an opportunity to ask questions and all were answered. The patient agreed with the plan and demonstrated an understanding of the instructions.   The patient was advised to call back or seek an in-person evaluation if the symptoms worsen or if the condition fails to improve as anticipated.    Einar Pheasant, MD

## 2020-02-10 NOTE — Telephone Encounter (Signed)
LMTCB and schedule a CPE in 4 months and fasting lab before appt

## 2020-02-11 DIAGNOSIS — M542 Cervicalgia: Secondary | ICD-10-CM | POA: Diagnosis not present

## 2020-02-11 DIAGNOSIS — M545 Low back pain: Secondary | ICD-10-CM | POA: Diagnosis not present

## 2020-02-11 DIAGNOSIS — M546 Pain in thoracic spine: Secondary | ICD-10-CM | POA: Diagnosis not present

## 2020-02-15 ENCOUNTER — Encounter: Payer: Self-pay | Admitting: Internal Medicine

## 2020-02-15 NOTE — Assessment & Plan Note (Signed)
Seeing psychiatry.  On cymbalta.  Off clonazepam.  Followed by psychiatry. Doing better.

## 2020-02-15 NOTE — Assessment & Plan Note (Signed)
Doing much better. Seeing Dr Elna Breslow. Off clonazepam.  On cymbalta.  Feels better.  Continue f/u with psychiatry.

## 2020-02-15 NOTE — Assessment & Plan Note (Signed)
On simvastatin.  Low cholesterol diet and exercise.  Follow lipid panel and liver function tests.   

## 2020-02-15 NOTE — Assessment & Plan Note (Signed)
Neck pain has improved.  Exercising.  Follow.

## 2020-02-15 NOTE — Assessment & Plan Note (Signed)
Low carb diet and exercise.  Follow met b and a1c.  

## 2020-02-16 DIAGNOSIS — M542 Cervicalgia: Secondary | ICD-10-CM | POA: Diagnosis not present

## 2020-02-16 DIAGNOSIS — M545 Low back pain: Secondary | ICD-10-CM | POA: Diagnosis not present

## 2020-02-16 DIAGNOSIS — M546 Pain in thoracic spine: Secondary | ICD-10-CM | POA: Diagnosis not present

## 2020-02-18 ENCOUNTER — Other Ambulatory Visit: Payer: Self-pay

## 2020-02-18 ENCOUNTER — Ambulatory Visit (INDEPENDENT_AMBULATORY_CARE_PROVIDER_SITE_OTHER): Payer: Medicare PPO | Admitting: Psychiatry

## 2020-02-18 ENCOUNTER — Encounter: Payer: Self-pay | Admitting: Psychiatry

## 2020-02-18 DIAGNOSIS — F411 Generalized anxiety disorder: Secondary | ICD-10-CM | POA: Diagnosis not present

## 2020-02-18 DIAGNOSIS — M545 Low back pain: Secondary | ICD-10-CM | POA: Diagnosis not present

## 2020-02-18 DIAGNOSIS — F3342 Major depressive disorder, recurrent, in full remission: Secondary | ICD-10-CM | POA: Diagnosis not present

## 2020-02-18 DIAGNOSIS — M546 Pain in thoracic spine: Secondary | ICD-10-CM | POA: Diagnosis not present

## 2020-02-18 DIAGNOSIS — M542 Cervicalgia: Secondary | ICD-10-CM | POA: Diagnosis not present

## 2020-02-18 NOTE — Progress Notes (Signed)
Provider Location : [facility] Patient Location : Home  Virtual Visit via Video Note  I connected with Angelica Robertson on 02/18/20 at  1:00 PM EST by a video enabled telemedicine application and verified that I am speaking with the correct person using two identifiers.   I discussed the limitations of evaluation and management by telemedicine and the availability of in person appointments. The patient expressed understanding and agreed to proceed.    I discussed the assessment and treatment plan with the patient. The patient was provided an opportunity to ask questions and all were answered. The patient agreed with the plan and demonstrated an understanding of the instructions.   The patient was advised to call back or seek an in-person evaluation if the symptoms worsen or if the condition fails to improve as anticipated.   [place] MD OP Progress Note  02/18/2020 1:59 PM Angelica Robertson  MRN:  [id number]  Chief Complaint:  Chief Complaint    Follow-up     HPI: Savi is a 74 year old Caucasian female, married, retired, lives in [place], has a history of MDD, GAD, hyperlipidemia, arthritis was evaluated by telemedicine today.  Patient today reports she is currently doing well with regards to her mood symptoms.  She denies any depression or anxiety symptoms.  She reports she is able to sleep well through the night.  She is compliant on mirtazapine as prescribed.  She is compliant on her Cymbalta.  She denies side effects to any of her medications.  Patient denies any suicidality, homicidality or perceptual disturbances.  She reports appetite is good.  She has been trying to exercise regularly as well as has been working in her yard since the weather is better.  She reports she has followed up with therapist Mr. [name] a few times however does not know if she wants to continue the therapy sessions since she feels better.  She denies any other concerns today. Visit Diagnosis:    ICD-10-CM    1. MDD (major depressive disorder), recurrent, in full remission ([place])  F33.42   2. GAD (generalized anxiety disorder)  F41.1     Past Psychiatric History: I have reviewed past psychiatric history from my progress note on 01/12/2020.  Past Medical History:  Past Medical History:  Diagnosis Date  . Depression 2000  . Heart murmur   . Hypercholesterolemia Osteoporosis  . Hyperlipemia     Past Surgical History:  Procedure Laterality Date  . ABDOMINAL HYSTERECTOMY  1980  . COLONOSCOPY WITH PROPOFOL N/A 03/05/2018   Procedure: COLONOSCOPY WITH PROPOFOL;  Surgeon: Toledo, [name], MD;  Location: [facility] ENDOSCOPY;  Service: Endoscopy;  Laterality: N/A;    Family Psychiatric History: I have reviewed family psychiatric history from my progress note on 01/12/2020.  Family History:  Family History  Problem Relation Age of Onset  . Heart disease Mother   . Diabetes Mother   . Heart disease Father   . Diabetes Father   . Colon cancer Other   . Congestive Heart Failure Brother   . Colon cancer Brother   . Mental illness Paternal Aunt   . BRCA 1/2 Neg Hx   . Breast cancer Neg Hx   . Cowden syndrome Neg Hx   . DES usage Neg Hx   . Endometrial cancer Neg Hx   . Li-Fraumeni syndrome Neg Hx   . Ovarian cancer Neg Hx     Social History: I have reviewed social history from my progress note on 01/12/2020. Social History  Socioeconomic History  . Marital status: Married    Spouse name: robert  . Number of children: 2  . Years of education: Not on file  . Highest education level: Not on file  Occupational History  . Not on file  Tobacco Use  . Smoking status: Never Smoker  . Smokeless tobacco: Never Used  Substance and Sexual Activity  . Alcohol use: Yes    Alcohol/week: 0.0 standard drinks    Comment: occasionally - socially during the summer a glass of wine or beer  . Drug use: No  . Sexual activity: Yes  Other Topics Concern  . Not on file  Social History Narrative  . Not on  file   Social Determinants of Health   Financial Resource Strain:   . Difficulty of Paying Living Expenses: Not on file  Food Insecurity:   . Worried About Charity fundraiser in the Last Year: Not on file  . Ran Out of Food in the Last Year: Not on file  Transportation Needs:   . Lack of Transportation (Medical): Not on file  . Lack of Transportation (Non-Medical): Not on file  Physical Activity: Sufficiently Active  . Days of Exercise per Week: 5 days  . Minutes of Exercise per Session: 30 min  Stress: Stress Concern Present  . Feeling of Stress : To some extent  Social Connections: Unknown  . Frequency of Communication with Friends and Family: Not on file  . Frequency of Social Gatherings with Friends and Family: Not on file  . Attends Religious Services: Not on file  . Active Member of Clubs or Organizations: Not on file  . Attends Archivist Meetings: Not on file  . Marital Status: Married    Allergies: No Known Allergies  Metabolic Disorder Labs: Lab Results  Component Value Date   HGBA1C 6.4 01/06/2020   No results found for: PROLACTIN Lab Results  Component Value Date   CHOL 167 12/18/2019   TRIG 92 12/18/2019   HDL 71 12/18/2019   CHOLHDL 2.4 12/18/2019   VLDL 18 12/18/2019   LDLCALC 78 12/18/2019   LDLCALC 88 06/02/2019   Lab Results  Component Value Date   TSH 3.160 12/18/2019   TSH 1.511 12/17/2019    Therapeutic Level Labs: No results found for: LITHIUM No results found for: VALPROATE No components found for:  CBMZ  Current Medications: Current Outpatient Medications  Medication Sig Dispense Refill  . aspirin EC 81 MG tablet Take 1 tablet (81 mg total) by mouth daily. 30 tablet 0  . calcium carbonate (OS-CAL) 600 MG TABS tablet Take 600 mg by mouth 2 (two) times daily with a meal.    . DULoxetine (CYMBALTA) 20 MG capsule TAKE 2 CAPSULES BY MOUTH ONCE DAILY 60 capsule 1  . Glucosamine-Chondroitin 250-200 MG CAPS Take 1 tablet by mouth  daily. TAKES 25 MCG    . mirtazapine (REMERON) 30 MG tablet TAKE 1 TABLET BY MOUTH AT BEDTIME 30 tablet 1  . simvastatin (ZOCOR) 20 MG tablet Take 1 tablet (20 mg total) by mouth daily at 6 PM. 30 tablet 0   No current facility-administered medications for this visit.     Musculoskeletal: Strength & Muscle Tone: UTA Gait & Station: normal Patient leans: N/A  Psychiatric Specialty Exam: Review of Systems  Psychiatric/Behavioral: Negative for agitation, confusion, decreased concentration, dysphoric mood, hallucinations, self-injury, sleep disturbance and suicidal ideas. The patient is not nervous/anxious and is not hyperactive.   All other systems reviewed and are  negative.   Last menstrual period 11/27/1979.There is no height or weight on file to calculate BMI.  General Appearance: Casual  Eye Contact:  Fair  Speech:  Clear and Coherent  Volume:  Normal  Mood:  Euthymic  Affect:  Congruent  Thought Process:  Goal Directed and Descriptions of Associations: Intact  Orientation:  Full (Time, Place, and Person)  Thought Content: Logical   Suicidal Thoughts:  No  Homicidal Thoughts:  No  Memory:  Immediate;   Fair Recent;   Fair Remote;   Fair  Judgement:  Fair  Insight:  Fair  Psychomotor Activity:  Normal  Concentration:  Concentration: Fair and Attention Span: Fair  Recall:  [employer] of Knowledge: Fair  Language: Fair  Akathisia:  No  Handed:  Right  AIMS (if indicated): UTA  Assets:  Communication Skills Desire for Improvement Housing Social Support  ADL's:  Intact  Cognition: WNL  Sleep:  Fair   Screenings: AUDIT     Admission (Discharged) from 12/17/2019 in [facility] Admission (Discharged) from OP Visit from 12/09/2012 in [facility]  Alcohol Use Disorder Identification Test Final Score (AUDIT)  0  0    GAD-7     Counselor from 01/14/2020 in [facility]  Total GAD-7  Score  5    Mini-Mental     Clinical Support from 02/27/2018 in [facility] from 02/02/2017 in [facility] from 02/03/2016 in [facility]  Total Score (max 30 points )  30  30  30     PHQ2-9     Counselor from 12/25/2019 in [facility] Office Visit from 10/21/2019 in [facility] Office Visit from 06/02/2019 in [facility] from 03/11/2019 in [facility] from 02/27/2018 in [facility]  PHQ-2 Total Score  3  6  0  0  0  PHQ-9 Total Score  11  17  0  -  -       Assessment and Plan: Avnoor is a 74 year old Caucasian female, married, retired, lives in [place], has a history of depression, anxiety, hyperlipidemia, arthritis was evaluated by telemedicine today.  Patient is biologically predisposed given her history of trauma.  Patient with psychosocial stressors of the pandemic, recent inpatient mental health admission.  She is currently stable on current medication regimen.  Plan as noted below.  Plan MDD-stable Cymbalta 60 mg p.o. daily Remeron 30 mg p.o. nightly Continue CBT with Mr. [name] as needed  GAD-stable Cymbalta and Remeron as prescribed  Have reviewed medical records per Dr. [name] -dated January 13, 2015, October 05, 2014-' patient is a 74 year old female with history of recurrent major depression recently discharged from [facility] inpatient psychiatric services on December 16, 2012.  Patient has been compliant with Zoloft 50 mg p.o. daily.  She denies any current active or passive suicidal thoughts.  Denies any problems with insomnia, changes in appetite, weight gain or weight loss.-Diagnosis-major depression-Zoloft 50 mg p.o. daily.'  Follow-up in clinic in 2 months or sooner if needed.    I have spent atleast 20 minutes non face to face with  patient today. More than 50 % of the time was spent for preparing to see the patient ( e.g., review of test, records ), obtaining and to review and separately obtained history , ordering medications and test ,psychoeducation and  supportive psychotherapy and care coordination,as well as documenting clinical information in electronic health record.  This note was generated in part or whole with voice recognition software. Voice recognition is usually quite accurate but there are transcription errors that can and very often do occur. I apologize for any typographical errors that were not detected and corrected.       Ursula Alert, MD 02/18/2020, 1:59 PM

## 2020-02-23 DIAGNOSIS — M546 Pain in thoracic spine: Secondary | ICD-10-CM | POA: Diagnosis not present

## 2020-02-23 DIAGNOSIS — M542 Cervicalgia: Secondary | ICD-10-CM | POA: Diagnosis not present

## 2020-02-23 DIAGNOSIS — M545 Low back pain: Secondary | ICD-10-CM | POA: Diagnosis not present

## 2020-02-25 ENCOUNTER — Ambulatory Visit (HOSPITAL_COMMUNITY): Payer: Medicare PPO | Admitting: Licensed Clinical Social Worker

## 2020-02-25 ENCOUNTER — Other Ambulatory Visit: Payer: Self-pay

## 2020-02-25 ENCOUNTER — Ambulatory Visit (INDEPENDENT_AMBULATORY_CARE_PROVIDER_SITE_OTHER): Payer: Medicare PPO | Admitting: *Deleted

## 2020-02-25 DIAGNOSIS — E538 Deficiency of other specified B group vitamins: Secondary | ICD-10-CM | POA: Diagnosis not present

## 2020-02-25 DIAGNOSIS — M545 Low back pain: Secondary | ICD-10-CM | POA: Diagnosis not present

## 2020-02-25 DIAGNOSIS — M546 Pain in thoracic spine: Secondary | ICD-10-CM | POA: Diagnosis not present

## 2020-02-25 DIAGNOSIS — M542 Cervicalgia: Secondary | ICD-10-CM | POA: Diagnosis not present

## 2020-02-25 MED ORDER — CYANOCOBALAMIN 1000 MCG/ML IJ SOLN
1000.0000 ug | Freq: Once | INTRAMUSCULAR | Status: AC
  Administered 2020-02-25: 12:00:00 1000 ug via INTRAMUSCULAR

## 2020-02-25 NOTE — Progress Notes (Addendum)
Patient presented for B 12 injection to right deltoid, patient voiced no concerns nor showed any signs of distress during injection.  Reviewed.  Dr Scott 

## 2020-03-03 DIAGNOSIS — D225 Melanocytic nevi of trunk: Secondary | ICD-10-CM | POA: Diagnosis not present

## 2020-03-03 DIAGNOSIS — L728 Other follicular cysts of the skin and subcutaneous tissue: Secondary | ICD-10-CM | POA: Diagnosis not present

## 2020-03-03 DIAGNOSIS — L821 Other seborrheic keratosis: Secondary | ICD-10-CM | POA: Diagnosis not present

## 2020-03-09 ENCOUNTER — Other Ambulatory Visit: Payer: Self-pay | Admitting: Psychiatry

## 2020-03-09 ENCOUNTER — Ambulatory Visit (HOSPITAL_COMMUNITY): Payer: Medicare PPO | Admitting: Licensed Clinical Social Worker

## 2020-03-09 ENCOUNTER — Other Ambulatory Visit: Payer: Self-pay

## 2020-03-09 ENCOUNTER — Telehealth (HOSPITAL_COMMUNITY): Payer: Self-pay | Admitting: Licensed Clinical Social Worker

## 2020-03-11 ENCOUNTER — Ambulatory Visit: Payer: Medicare Other

## 2020-03-23 ENCOUNTER — Other Ambulatory Visit: Payer: Self-pay

## 2020-03-23 ENCOUNTER — Ambulatory Visit (INDEPENDENT_AMBULATORY_CARE_PROVIDER_SITE_OTHER): Payer: Medicare PPO | Admitting: Licensed Clinical Social Worker

## 2020-03-23 DIAGNOSIS — F3342 Major depressive disorder, recurrent, in full remission: Secondary | ICD-10-CM

## 2020-03-23 NOTE — Progress Notes (Signed)
Virtual Visit via Video Note  I connected withSue Hesteron4/13/21at2:00pmby[employer] Webex video meetingand verified that I am speaking with the correct person using two identifiers.  I discussed the limitations, risks, security and privacy concerns of performing an evaluation and management service by telephone and the availability of in person appointments. I also discussed with the patient that there may be a patient responsible charge related to this service. The patient expressed understanding and agreed to proceed.  I discussed the assessment and treatment plan with the patient. The patient was provided an opportunity to ask questions and all were answered. The patient agreed with the plan and demonstrated an understanding of the instructions.  The patient was advised to call back or seek an in-person evaluation if the symptoms worsen or if the condition fails to improve as anticipated.  I provided30 minutesof non-face-to-face time during this encounter.   Angelica Flood, LCSW, LCAS _____________________________ THERAPIST PROGRESS NOTE  Session Time:2:00pm -2:30pm  Participation Level:Active  Behavioral Response:Alert, neatly groomed, casually dressed, euthymic mood  Type of Therapy: Individual Therapy  Treatment Goals addressed:Depression and anxiety management;self-care routine; medication compliance; Psychiatrist follow-up   Interventions:CBT  Summary:Angelica Robertson is a 74 year old Caucasian female that presented for Webex video session today with Major depressive disorder,recurrent, in full remission.   Suicidal/Homicidal:None, without plan or intent  Therapist Response:Clinician met with Angelica Robertson for virtual session via [employer] application. Clinician assessed for safety and medication compliance. Angelica Robertson presented for virtual session on time and was alert, oriented x5, with no evidence or self-report of SI/HI or A/V H.  Angelica Robertson reported compliance  with current medication.  Clinician inquired about Angelica Robertson's current emotional ratings, as well as any significant changes in thoughts, feelings, and behaviors sinceprevious session.Angelica Robertson reported scores of 0/10 for both depression and anxiety at this time, and noted that she has maintained a positive mood and routine for herself for roughly 2 months now, which is why she has not been as engaged in therapy.  Clinician inquired about Angelica Robertson's progress towards goals in previous weeks, as well as any barriers to success remaining.  Angelica Robertson reported that she has continued to follow treatment plan closely, noting that she has been following a healthy diet, exercising at [employer] from Monday-Friday for an average of 1 hour daily, attending church x2 per week, successfully completed 8 sessions of physical therapy along with practicing related exercises daily to retain improved mobility, and getting 8 hours of uninterrupted sleep nightly.  Angelica Robertson reported that she has been more optimistic and hopeful about the future, and much of her free time outside of this routine has been spent on remodeling the home with her husband, who continues to be supportive.  She reported that she has a trip planned to visit friends in [place] in upcoming weeks, and due to progress she has made, has considered ending therapy for time being due to time and cost of sessions.  Clinician assisted Angelica Robertson in running cost benefit analysis for discontinuing therapy at this time to make informed, well planned decision.  Angelica Robertson reported that based upon this assessment, she feels comfortable ending therapy due to ongoing progress made with current plan and stabilized mood.  Clinician encouraged Angelica Robertson to reach back out for assistance with clinic if depressive and anxiety symptoms return, and to stay engaged with psychiatrist for close medication monitoring.  Angelica Robertson promised that she would and thanked clinician for assistance that has been provided since beginning of this  treatment episode.     Plan:Meet again in2weeksvirtually.  Diagnosis: Major depressive disorder,recurrent, in full remission  Angelica Robertson, [place], LCAS 03/23/20

## 2020-03-24 ENCOUNTER — Other Ambulatory Visit: Payer: Self-pay

## 2020-03-24 ENCOUNTER — Ambulatory Visit (INDEPENDENT_AMBULATORY_CARE_PROVIDER_SITE_OTHER): Payer: Medicare PPO

## 2020-03-24 DIAGNOSIS — E538 Deficiency of other specified B group vitamins: Secondary | ICD-10-CM

## 2020-03-24 MED ORDER — CYANOCOBALAMIN 1000 MCG/ML IJ SOLN
1000.0000 ug | Freq: Once | INTRAMUSCULAR | Status: AC
  Administered 2020-03-24: 1000 ug via INTRAMUSCULAR

## 2020-03-24 NOTE — Progress Notes (Signed)
Patient presented for B 12 injection to left deltoid, patient voiced no concerns nor showed any signs of distress during injection. 

## 2020-04-13 ENCOUNTER — Telehealth: Payer: Self-pay

## 2020-04-13 DIAGNOSIS — F411 Generalized anxiety disorder: Secondary | ICD-10-CM

## 2020-04-13 DIAGNOSIS — F332 Major depressive disorder, recurrent severe without psychotic features: Secondary | ICD-10-CM

## 2020-04-13 MED ORDER — DULOXETINE HCL 20 MG PO CPEP: 40.0000 mg | ORAL_CAPSULE | Freq: Every day | ORAL | 3 refills | Status: DC

## 2020-04-13 NOTE — Telephone Encounter (Signed)
I have sent Cymbalta to pharmacy. 

## 2020-04-13 NOTE — Telephone Encounter (Signed)
received fax for medication refill on the duloxetine hcl 20mg 

## 2020-04-20 ENCOUNTER — Telehealth (INDEPENDENT_AMBULATORY_CARE_PROVIDER_SITE_OTHER): Payer: Medicare PPO | Admitting: Psychiatry

## 2020-04-20 ENCOUNTER — Other Ambulatory Visit: Payer: Self-pay

## 2020-04-20 ENCOUNTER — Encounter: Payer: Self-pay | Admitting: Psychiatry

## 2020-04-20 DIAGNOSIS — F411 Generalized anxiety disorder: Secondary | ICD-10-CM | POA: Diagnosis not present

## 2020-04-20 DIAGNOSIS — F3342 Major depressive disorder, recurrent, in full remission: Secondary | ICD-10-CM | POA: Diagnosis not present

## 2020-04-20 MED ORDER — MIRTAZAPINE 30 MG PO TABS: 30.0000 mg | ORAL_TABLET | Freq: Every day | ORAL | 1 refills | Status: DC

## 2020-04-20 MED ORDER — DULOXETINE HCL 20 MG PO CPEP: 40.0000 mg | ORAL_CAPSULE | Freq: Every day | ORAL | 1 refills | Status: DC

## 2020-04-20 NOTE — Progress Notes (Signed)
Provider Location : [facility] Patient Location : Home  Virtual Visit via Video Note  I connected with Angelica Robertson on 04/20/20 at  2:00 PM EDT by a video enabled telemedicine application and verified that I am speaking with the correct person using two identifiers.   I discussed the limitations of evaluation and management by telemedicine and the availability of in person appointments. The patient expressed understanding and agreed to proceed.      I discussed the assessment and treatment plan with the patient. The patient was provided an opportunity to ask questions and all were answered. The patient agreed with the plan and demonstrated an understanding of the instructions.   The patient was advised to call back or seek an in-person evaluation if the symptoms worsen or if the condition fails to improve as anticipated.   [place] MD OP Progress Note  04/20/2020 2:18 PM Angelica Robertson  MRN:  [id number]  Chief Complaint:  Chief Complaint    Follow-up     HPI: Angelica Robertson is a 74 year old Caucasian female, married, retired, lives in [place], has a history of MDD, GAD, hyperlipidemia, arthritis was evaluated by telemedicine today.  Patient today reports she is currently doing well with regards to her anxiety and depression.  She denies any sadness, lack of motivation or sleep issues.  She denies suicidality, homicidality or perceptual disturbances.  She reports she has been trying to stay active.  She has been spending time with family and friends.  She recently took a vacation to [place] and she had a fabulous time.  She denies side effects to medications and is compliant on them.  She reports she had an appointment with Mr. [name] and they have decided to change her therapy to as needed since she is doing well.  Patient denies any other concerns today.  Visit Diagnosis:    ICD-10-CM   1. MDD (major depressive disorder), recurrent, in full remission ([place])  F33.42 DULoxetine (CYMBALTA)  20 MG capsule    mirtazapine (REMERON) 30 MG tablet  2. GAD (generalized anxiety disorder)  F41.1 DULoxetine (CYMBALTA) 20 MG capsule    mirtazapine (REMERON) 30 MG tablet    Past Psychiatric History: I have reviewed past psychiatric history from my progress note on 01/12/2020.  Past Medical History:  Past Medical History:  Diagnosis Date  . Depression 2000  . Heart murmur   . Hypercholesterolemia Osteoporosis  . Hyperlipemia     Past Surgical History:  Procedure Laterality Date  . ABDOMINAL HYSTERECTOMY  1980  . COLONOSCOPY WITH PROPOFOL N/A 03/05/2018   Procedure: COLONOSCOPY WITH PROPOFOL;  Surgeon: Toledo, [name], MD;  Location: [facility] ENDOSCOPY;  Service: Endoscopy;  Laterality: N/A;    Family Psychiatric History: I have reviewed family psychiatric history from my progress note on 01/12/2020.  Family History:  Family History  Problem Relation Age of Onset  . Heart disease Mother   . Diabetes Mother   . Heart disease Father   . Diabetes Father   . Colon cancer Other   . Congestive Heart Failure Brother   . Colon cancer Brother   . Mental illness Paternal Aunt   . BRCA 1/2 Neg Hx   . Breast cancer Neg Hx   . Cowden syndrome Neg Hx   . DES usage Neg Hx   . Endometrial cancer Neg Hx   . Li-Fraumeni syndrome Neg Hx   . Ovarian cancer Neg Hx     Social History: I have reviewed social history  from my progress note on 01/12/2020. Social History   Socioeconomic History  . Marital status: Married    Spouse name: robert  . Number of children: 2  . Years of education: Not on file  . Highest education level: Not on file  Occupational History  . Not on file  Tobacco Use  . Smoking status: Never Smoker  . Smokeless tobacco: Never Used  Substance and Sexual Activity  . Alcohol use: Yes    Alcohol/week: 0.0 standard drinks    Comment: occasionally - socially during the summer a glass of wine or beer  . Drug use: No  . Sexual activity: Yes  Other Topics Concern  . Not  on file  Social History Narrative  . Not on file   Social Determinants of Health   Financial Resource Strain:   . Difficulty of Paying Living Expenses:   Food Insecurity:   . Worried About Charity fundraiser in the Last Year:   . Arboriculturist in the Last Year:   Transportation Needs:   . Film/video editor (Medical):   Marland Kitchen Lack of Transportation (Non-Medical):   Physical Activity:   . Days of Exercise per Week:   . Minutes of Exercise per Session:   Stress: Stress Concern Present  . Feeling of Stress : To some extent  Social Connections: Unknown  . Frequency of Communication with Friends and Family: Not on file  . Frequency of Social Gatherings with Friends and Family: Not on file  . Attends Religious Services: Not on file  . Active Member of Clubs or Organizations: Not on file  . Attends Archivist Meetings: Not on file  . Marital Status: Married    Allergies: No Known Allergies  Metabolic Disorder Labs: Lab Results  Component Value Date   HGBA1C 6.4 01/06/2020   No results found for: PROLACTIN Lab Results  Component Value Date   CHOL 167 12/18/2019   TRIG 92 12/18/2019   HDL 71 12/18/2019   CHOLHDL 2.4 12/18/2019   VLDL 18 12/18/2019   LDLCALC 78 12/18/2019   LDLCALC 88 06/02/2019   Lab Results  Component Value Date   TSH 3.160 12/18/2019   TSH 1.511 12/17/2019    Therapeutic Level Labs: No results found for: LITHIUM No results found for: VALPROATE No components found for:  CBMZ  Current Medications: Current Outpatient Medications  Medication Sig Dispense Refill  . aspirin EC 81 MG tablet Take 1 tablet (81 mg total) by mouth daily. 30 tablet 0  . calcium carbonate (OS-CAL) 600 MG TABS tablet Take 600 mg by mouth 2 (two) times daily with a meal.    . DULoxetine (CYMBALTA) 20 MG capsule Take 2 capsules (40 mg total) by mouth daily. 180 capsule 1  . Glucosamine-Chondroitin 250-200 MG CAPS Take 1 tablet by mouth daily. TAKES 25 MCG    .  mirtazapine (REMERON) 30 MG tablet Take 1 tablet (30 mg total) by mouth at bedtime. 90 tablet 1  . simvastatin (ZOCOR) 20 MG tablet Take 1 tablet (20 mg total) by mouth daily at 6 PM. 30 tablet 0   No current facility-administered medications for this visit.     Musculoskeletal: Strength & Muscle Tone: UTA Gait & Station: normal Patient leans: N/A  Psychiatric Specialty Exam: Review of Systems  Psychiatric/Behavioral: Negative for agitation, behavioral problems, confusion, decreased concentration, dysphoric mood, hallucinations, self-injury, sleep disturbance and suicidal ideas. The patient is not nervous/anxious and is not hyperactive.   All other  systems reviewed and are negative.   Last menstrual period 11/27/1979.There is no height or weight on file to calculate BMI.  General Appearance: Casual  Eye Contact:  Fair  Speech:  Clear and Coherent  Volume:  Normal  Mood:  Euthymic  Affect:  Appropriate  Thought Process:  Goal Directed and Descriptions of Associations: Intact  Orientation:  Full (Time, Place, and Person)  Thought Content: Logical   Suicidal Thoughts:  No  Homicidal Thoughts:  No  Memory:  Immediate;   Fair Recent;   Fair Remote;   Fair  Judgement:  Fair  Insight:  Fair  Psychomotor Activity:  Normal  Concentration:  Concentration: Fair and Attention Span: Fair  Recall:  [employer] of Knowledge: Fair  Language: Fair  Akathisia:  No  Handed:  Right  AIMS (if indicated): UTA  Assets:  Communication Skills Desire for Improvement Housing Social Support  ADL's:  Intact  Cognition: WNL  Sleep:  Fair   Screenings: AUDIT     Admission (Discharged) from 12/17/2019 in [place] Admission (Discharged) from OP Visit from 12/09/2012 in [facility]  Alcohol Use Disorder Identification Test Final Score (AUDIT)  0  0    GAD-7     Counselor from 01/14/2020 in [place]   Total GAD-7 Score  5    Mini-Mental     Clinical Support from 02/27/2018 in [facility] from 02/02/2017 in [place] [facility] from 02/03/2016 in [facility]  Total Score (max 30 points )  '30  30  30    '$ PHQ2-9     Video Visit from 04/20/2020 in [place] Counselor from 12/25/2019 in [place] Office Visit from 10/21/2019 in [place] Office Visit from 06/02/2019 in [place] from 03/11/2019 in [place]  PHQ-2 Total Score  0  3  6  0  0  PHQ-9 Total Score  --  11  17  0  --       Assessment and Plan: Angelica Robertson is a 74 year old Caucasian female, married, retired, lives in [place], has a history of depression, anxiety, hyperlipidemia, arthritis was evaluated by telemedicine today.  Patient is biologically predisposed given her history of trauma.  Patient with psychosocial stressors of the pandemic, is currently stable on medications.  Plan as noted below.  Plan MDD in remission Cymbalta 60 mg p.o. daily Remeron 30 mg p.o. nightly Continue CBT as needed with Mr. [name]. PHQ 9-0  GAD-stable Cymbalta and Remeron as prescribed  Discussed with patient that if she continues to remain stable she can be tapered off of her medications.  Follow-up in clinic in 3 months or sooner if needed.  I have spent atleast 20 minutes non face to face with patient today. More than 50 % of the time was spent for preparing to see the patient ( e.g., review of test, records ),  ordering medications and test ,psychoeducation and supportive psychotherapy and care coordination,as well as documenting clinical information in electronic health record. This note was generated in part or whole with voice recognition software. Voice recognition is usually quite accurate but there are  transcription errors that can and very often do occur. I apologize for any typographical errors that were not detected and corrected.        Ursula Alert, MD 04/20/2020, 2:18  PM

## 2020-04-27 ENCOUNTER — Ambulatory Visit (INDEPENDENT_AMBULATORY_CARE_PROVIDER_SITE_OTHER): Payer: Medicare PPO

## 2020-04-27 ENCOUNTER — Other Ambulatory Visit: Payer: Self-pay

## 2020-04-27 DIAGNOSIS — E538 Deficiency of other specified B group vitamins: Secondary | ICD-10-CM | POA: Diagnosis not present

## 2020-04-27 MED ORDER — CYANOCOBALAMIN 1000 MCG/ML IJ SOLN
1000.0000 ug | Freq: Once | INTRAMUSCULAR | Status: AC
  Administered 2020-04-27: 1000 ug via INTRAMUSCULAR

## 2020-04-27 NOTE — Progress Notes (Addendum)
Patient presented for B 12 injection to left deltoid, patient voiced no concerns nor showed any signs of distress during injection.  Reviewed.  Dr Scott 

## 2020-05-17 ENCOUNTER — Other Ambulatory Visit: Payer: Self-pay | Admitting: Internal Medicine

## 2020-05-17 DIAGNOSIS — Z1231 Encounter for screening mammogram for malignant neoplasm of breast: Secondary | ICD-10-CM

## 2020-06-01 ENCOUNTER — Ambulatory Visit (INDEPENDENT_AMBULATORY_CARE_PROVIDER_SITE_OTHER): Payer: Medicare PPO

## 2020-06-01 ENCOUNTER — Other Ambulatory Visit: Payer: Self-pay

## 2020-06-01 DIAGNOSIS — E538 Deficiency of other specified B group vitamins: Secondary | ICD-10-CM

## 2020-06-01 MED ORDER — CYANOCOBALAMIN 1000 MCG/ML IJ SOLN
1000.0000 ug | Freq: Once | INTRAMUSCULAR | Status: AC
  Administered 2020-06-01: 1000 ug via INTRAMUSCULAR

## 2020-06-01 NOTE — Progress Notes (Addendum)
Patient presented for B 12 injection to left deltoid, patient voiced no concerns nor showed any signs of distress during injection.  Reviewed.  Dr Scott 

## 2020-06-02 DIAGNOSIS — H2513 Age-related nuclear cataract, bilateral: Secondary | ICD-10-CM | POA: Diagnosis not present

## 2020-06-15 ENCOUNTER — Ambulatory Visit
Admission: RE | Admit: 2020-06-15 | Discharge: 2020-06-15 | Disposition: A | Discharge status: Home / Self Care | Payer: Medicare PPO | Source: Ambulatory Visit | Attending: Internal Medicine | Admitting: Internal Medicine

## 2020-06-15 DIAGNOSIS — Z1231 Encounter for screening mammogram for malignant neoplasm of breast: Secondary | ICD-10-CM | POA: Diagnosis not present

## 2020-06-16 ENCOUNTER — Other Ambulatory Visit: Payer: Self-pay

## 2020-06-16 ENCOUNTER — Other Ambulatory Visit (INDEPENDENT_AMBULATORY_CARE_PROVIDER_SITE_OTHER): Payer: Medicare PPO

## 2020-06-16 DIAGNOSIS — E7800 Pure hypercholesterolemia, unspecified: Secondary | ICD-10-CM | POA: Diagnosis not present

## 2020-06-16 DIAGNOSIS — R739 Hyperglycemia, unspecified: Secondary | ICD-10-CM

## 2020-06-16 LAB — HEPATIC FUNCTION PANEL
ALT: 19 U/L (ref 0–35)
AST: 20 U/L (ref 0–37)
Albumin: 4.5 g/dL (ref 3.5–5.2)
Alkaline Phosphatase: 62 U/L (ref 39–117)
Bilirubin, Direct: 0.1 mg/dL (ref 0.0–0.3)
Total Bilirubin: 0.5 mg/dL (ref 0.2–1.2)
Total Protein: 6.9 g/dL (ref 6.0–8.3)

## 2020-06-16 LAB — LIPID PANEL
Cholesterol: 184 mg/dL (ref 0–200)
HDL: 56.8 mg/dL (ref >39.00)
LDL Cholesterol: 103 mg/dL — ABNORMAL HIGH (ref 0–99)
NonHDL: 127.63
Total CHOL/HDL Ratio: 3
Triglycerides: 124 mg/dL (ref 0.0–149.0)
VLDL: 24.8 mg/dL (ref 0.0–40.0)

## 2020-06-16 LAB — BASIC METABOLIC PANEL
BUN: 19 mg/dL (ref 6–23)
CO2: 31 mEq/L (ref 19–32)
Calcium: 10 mg/dL (ref 8.4–10.5)
Chloride: 102 mEq/L (ref 96–112)
Creatinine, Ser: 0.84 mg/dL (ref 0.40–1.20)
GFR: 66.67 mL/min (ref >60.00)
Glucose, Bld: 112 mg/dL — ABNORMAL HIGH (ref 70–99)
Potassium: 4.5 mEq/L (ref 3.5–5.1)
Sodium: 140 mEq/L (ref 135–145)

## 2020-06-16 LAB — HEMOGLOBIN A1C: Hgb A1c MFr Bld: 6.2 % (ref 4.6–6.5)

## 2020-06-18 ENCOUNTER — Encounter: Payer: Medicare PPO | Admitting: Internal Medicine

## 2020-06-21 ENCOUNTER — Encounter: Payer: Medicare PPO | Admitting: Internal Medicine

## 2020-07-05 ENCOUNTER — Ambulatory Visit (INDEPENDENT_AMBULATORY_CARE_PROVIDER_SITE_OTHER): Payer: Medicare PPO | Admitting: Internal Medicine

## 2020-07-05 ENCOUNTER — Other Ambulatory Visit: Payer: Self-pay

## 2020-07-05 ENCOUNTER — Encounter: Payer: Self-pay | Admitting: Internal Medicine

## 2020-07-05 VITALS — BP 146/88 | HR 82 | Temp 98.2°F | Resp 16 | Ht 63.0 in | Wt 144.0 lb

## 2020-07-05 DIAGNOSIS — F332 Major depressive disorder, recurrent severe without psychotic features: Secondary | ICD-10-CM

## 2020-07-05 DIAGNOSIS — M542 Cervicalgia: Secondary | ICD-10-CM

## 2020-07-05 DIAGNOSIS — E7800 Pure hypercholesterolemia, unspecified: Secondary | ICD-10-CM | POA: Diagnosis not present

## 2020-07-05 DIAGNOSIS — R030 Elevated blood-pressure reading, without diagnosis of hypertension: Secondary | ICD-10-CM | POA: Diagnosis not present

## 2020-07-05 DIAGNOSIS — Z0000 Encounter for general adult medical examination without abnormal findings: Secondary | ICD-10-CM

## 2020-07-05 DIAGNOSIS — R739 Hyperglycemia, unspecified: Secondary | ICD-10-CM

## 2020-07-05 MED ORDER — ROSUVASTATIN CALCIUM 20 MG PO TABS
20.0000 mg | ORAL_TABLET | Freq: Every day | ORAL | 1 refills | Status: DC
Start: 2020-07-05 — End: 2020-12-23

## 2020-07-05 NOTE — Progress Notes (Signed)
Patient ID: Angelica Robertson, female   DOB: 07-15-1946, 74 y.o.   MRN: [id number]   Subjective:    Patient ID: Angelica Robertson, female    DOB: Jan 30, 1946, 74 y.o.   MRN: [id number]  HPI This visit occurred during the SARS-CoV-2 public health emergency.  Safety protocols were in place, including screening questions prior to the visit, additional usage of staff PPE, and extensive cleaning of exam room while observing appropriate contact time as indicated for disinfecting solutions.  Patient here for a physical exam. She reports she is doing better.  Feels better. Seeing Dr Su Hoff.  Doing well on current medication regimen.  Is concerned about weight gain.  Going to [employer].  Stays active.  No chest pain or sob reported.  No abdominal pain or bowel change reported.  Overall doing better.  Blood pressures averaging 112-128/70s.        Past Medical History:  Diagnosis Date   Depression 2000   Heart murmur    Hypercholesterolemia Osteoporosis   Hyperlipemia    Past Surgical History:  Procedure Laterality Date   ABDOMINAL HYSTERECTOMY  1980   COLONOSCOPY WITH PROPOFOL N/A 03/05/2018   Procedure: COLONOSCOPY WITH PROPOFOL;  Surgeon: Toledo, [name], MD;  Location: [facility] ENDOSCOPY;  Service: Endoscopy;  Laterality: N/A;   Family History  Problem Relation Age of Onset   Heart disease Mother    Diabetes Mother    Heart disease Father    Diabetes Father    Colon cancer Other    Congestive Heart Failure Brother    Colon cancer Brother    Mental illness Paternal Aunt    BRCA 1/2 Neg Hx    Breast cancer Neg Hx    Cowden syndrome Neg Hx    DES usage Neg Hx    Endometrial cancer Neg Hx    Li-Fraumeni syndrome Neg Hx    Ovarian cancer Neg Hx    Social History   Socioeconomic History   Marital status: Married    Spouse name: robert   Number of children: 2   Years of education: Not on file   Highest education level: Not on file  Occupational History   Not on file   Tobacco Use   Smoking status: Never Smoker   Smokeless tobacco: Never Used  Vaping Use   Vaping Use: Never used  Substance and Sexual Activity   Alcohol use: Yes    Alcohol/week: 0.0 standard drinks    Comment: occasionally - socially during the summer a glass of wine or beer   Drug use: No   Sexual activity: Yes  Other Topics Concern   Not on file  Social History Narrative   Not on file   Social Determinants of Health   Financial Resource Strain:    Difficulty of Paying Living Expenses:   Food Insecurity:    Worried About Charity fundraiser in the Last Year:    Arboriculturist in the Last Year:   Transportation Needs:    Film/video editor (Medical):    Lack of Transportation (Non-Medical):   Physical Activity:    Days of Exercise per Week:    Minutes of Exercise per Session:   Stress: Stress Concern Present   Feeling of Stress : To some extent  Social Connections: Unknown   Frequency of Communication with Friends and Family: Not on file   Frequency of Social Gatherings with Friends and Family: Not on file   Attends Religious Services: Not on  file   Active Member of Clubs or Organizations: Not on file   Attends Club or Organization Meetings: Not on file   Marital Status: Married    Outpatient Encounter Medications as of 07/05/2020  Medication Sig   aspirin EC 81 MG tablet Take 1 tablet (81 mg total) by mouth daily.   calcium carbonate (OS-CAL) 600 MG TABS tablet Take 600 mg by mouth 2 (two) times daily with a meal.   DULoxetine (CYMBALTA) 20 MG capsule Take 2 capsules (40 mg total) by mouth daily.   Glucosamine-Chondroitin 250-200 MG CAPS Take 1 tablet by mouth daily. TAKES 25 MCG   mirtazapine (REMERON) 30 MG tablet Take 1 tablet (30 mg total) by mouth at bedtime.   [DISCONTINUED] simvastatin (ZOCOR) 20 MG tablet Take 1 tablet (20 mg total) by mouth daily at 6 PM.   rosuvastatin (CRESTOR) 20 MG tablet Take 1 tablet (20 mg total) by  mouth daily.   No facility-administered encounter medications on file as of 07/05/2020.    Review of Systems  Constitutional: Negative for appetite change and unexpected weight change.  HENT: Negative for congestion and sinus pressure.   Eyes: Negative for pain and visual disturbance.  Respiratory: Negative for cough, chest tightness and shortness of breath.   Cardiovascular: Negative for chest pain, palpitations and leg swelling.  Gastrointestinal: Negative for abdominal pain, constipation, diarrhea, nausea and vomiting.  Genitourinary: Negative for difficulty urinating and dysuria.  Musculoskeletal: Negative for joint swelling and myalgias.  Skin: Negative for color change and rash.  Neurological: Negative for dizziness, light-headedness and headaches.  Hematological: Negative for adenopathy. Does not bruise/bleed easily.  Psychiatric/Behavioral: Negative for agitation and dysphoric mood.       Objective:    Physical Exam Vitals reviewed.  Constitutional:      General: She is not in acute distress.    Appearance: Normal appearance. She is well-developed.  HENT:     Head: Normocephalic and atraumatic.     Right Ear: External ear normal.     Left Ear: External ear normal.  Eyes:     General: No scleral icterus.       Right eye: No discharge.        Left eye: No discharge.     Conjunctiva/sclera: Conjunctivae normal.  Neck:     Thyroid: No thyromegaly.  Cardiovascular:     Rate and Rhythm: Normal rate and regular rhythm.  Pulmonary:     Effort: No tachypnea, accessory muscle usage or respiratory distress.     Breath sounds: Normal breath sounds. No decreased breath sounds or wheezing.  Chest:     Breasts:        Right: No inverted nipple, mass, nipple discharge or tenderness (no axillary adenopathy).        Left: No inverted nipple, mass, nipple discharge or tenderness (no axilarry adenopathy).  Abdominal:     General: Bowel sounds are normal.     Palpations: Abdomen  is soft.     Tenderness: There is no abdominal tenderness.  Musculoskeletal:        General: No swelling or tenderness.     Cervical back: Neck supple. No tenderness.  Lymphadenopathy:     Cervical: No cervical adenopathy.  Skin:    Findings: No erythema or rash.  Neurological:     Mental Status: She is alert and oriented to person, place, and time.  Psychiatric:        Mood and Affect: Mood normal.  Behavior: Behavior normal.     BP (!) 146/88    Pulse 82    Temp 98.2 F (36.8 C)    Resp 16    Ht 5' 3"  (1.6 m)    Wt 144 lb (65.3 kg)    LMP 11/27/1979    SpO2 98%    BMI 25.51 kg/m  Wt Readings from Last 3 Encounters:  07/05/20 144 lb (65.3 kg)  02/10/20 135 lb (61.2 kg)  12/23/19 134 lb (60.8 kg)     Lab Results  Component Value Date   WBC 9.0 01/06/2020   HGB 13.7 01/06/2020   HCT 41.5 01/06/2020   PLT 374.0 01/06/2020   GLUCOSE 112 (H) 06/16/2020   CHOL 184 06/16/2020   TRIG 124.0 06/16/2020   HDL 56.80 06/16/2020   LDLCALC 103 (H) 06/16/2020   ALT 19 06/16/2020   AST 20 06/16/2020   NA 140 06/16/2020   K 4.5 06/16/2020   CL 102 06/16/2020   CREATININE 0.84 06/16/2020   BUN 19 06/16/2020   CO2 31 06/16/2020   TSH 3.160 12/18/2019   HGBA1C 6.2 06/16/2020    MM 3D SCREEN BREAST BILATERAL  Result Date: 06/15/2020 CLINICAL DATA:  Screening. EXAM: DIGITAL SCREENING BILATERAL MAMMOGRAM WITH TOMO AND CAD COMPARISON:  Previous exam(s). ACR Breast Density Category b: There are scattered areas of fibroglandular density. FINDINGS: There are no findings suspicious for malignancy. Images were processed with CAD. IMPRESSION: No mammographic evidence of malignancy. A result letter of this screening mammogram will be mailed directly to the patient. RECOMMENDATION: Screening mammogram in one year. (Code:SM-B-01Y) BI-RADS CATEGORY  1: Negative. Electronically Signed   By: Audie Pinto M.D.   On: 06/15/2020 13:44       Assessment & Plan:   Problem List Items  Addressed This Visit    Elevated blood pressure reading    Recheck improved.  Outside checks within normal range.  Follow.        Health care maintenance    Physical today 07/05/20.  Mammogram 06/15/20 - Birads I.  Colonoscopy 03/05/18- entire colon normal.  Non bleeding internal hemorrhoids.        Hypercholesterolemia    On simvastatin. Discussed changing to crestor.  She is agreeable.  Low cholesterol diet and exercise.  Follow lipid panel and liver function tests.        Relevant Medications   rosuvastatin (CRESTOR) 20 MG tablet   Other Relevant Orders   Hepatic function panel   Hyperglycemia    Low carb diet and exercise.  Follow met b and a1c.       MDD (major depressive disorder), recurrent severe, without psychosis ([place])    Doing better. On cymbalta and remeron.  Follow.        Neck pain    Previously saw neurosurgery.  Referred to Dr Sharlet Salina.  Recommended traction and PT.  She is doing better.  Neck doing better.  Follow.            Einar Pheasant, MD

## 2020-07-06 ENCOUNTER — Ambulatory Visit (INDEPENDENT_AMBULATORY_CARE_PROVIDER_SITE_OTHER): Payer: Medicare PPO

## 2020-07-06 DIAGNOSIS — E538 Deficiency of other specified B group vitamins: Secondary | ICD-10-CM | POA: Diagnosis not present

## 2020-07-06 MED ORDER — CYANOCOBALAMIN 1000 MCG/ML IJ SOLN
1000.0000 ug | Freq: Once | INTRAMUSCULAR | Status: AC
  Administered 2020-07-06: 1000 ug via INTRAMUSCULAR

## 2020-07-06 NOTE — Progress Notes (Addendum)
Patient presented for B 12 injection to left deltoid, patient voiced no concerns nor showed any signs of distress during injection.  Reviewed.  Dr Scott 

## 2020-07-18 ENCOUNTER — Encounter: Payer: Self-pay | Admitting: Internal Medicine

## 2020-07-18 NOTE — Assessment & Plan Note (Signed)
Recheck improved.  Outside checks within normal range.  Follow.

## 2020-07-18 NOTE — Assessment & Plan Note (Addendum)
On simvastatin. Discussed changing to crestor.  She is agreeable.  Low cholesterol diet and exercise.  Follow lipid panel and liver function tests.

## 2020-07-18 NOTE — Assessment & Plan Note (Signed)
Low carb diet and exercise.  Follow met b and a1c.  

## 2020-07-18 NOTE — Assessment & Plan Note (Signed)
Physical today 07/05/20.  Mammogram 06/15/20 - Birads I.  Colonoscopy 03/05/18- entire colon normal.  Non bleeding internal hemorrhoids.

## 2020-07-18 NOTE — Assessment & Plan Note (Signed)
Previously saw neurosurgery.  Referred to Dr Yves Dill.  Recommended traction and PT.  She is doing better.  Neck doing better.  Follow.

## 2020-07-18 NOTE — Assessment & Plan Note (Signed)
Doing better. On cymbalta and remeron.  Follow.

## 2020-08-03 ENCOUNTER — Telehealth (INDEPENDENT_AMBULATORY_CARE_PROVIDER_SITE_OTHER): Payer: Medicare PPO | Admitting: Psychiatry

## 2020-08-03 ENCOUNTER — Encounter: Payer: Self-pay | Admitting: Psychiatry

## 2020-08-03 ENCOUNTER — Other Ambulatory Visit: Payer: Self-pay

## 2020-08-03 DIAGNOSIS — F411 Generalized anxiety disorder: Secondary | ICD-10-CM | POA: Diagnosis not present

## 2020-08-03 DIAGNOSIS — F3342 Major depressive disorder, recurrent, in full remission: Secondary | ICD-10-CM | POA: Insufficient documentation

## 2020-08-03 MED ORDER — DULOXETINE HCL 20 MG PO CPEP: 20.0000 mg | ORAL_CAPSULE | Freq: Every day | ORAL | 1 refills | Status: DC

## 2020-08-03 NOTE — Progress Notes (Signed)
Provider Location : [facility] Patient Location : Home  Participants: Patient , Provider  Virtual Visit via Video Note  I connected with Angelica Robertson on 08/03/20 at 11:00 AM EDT by a video enabled telemedicine application and verified that I am speaking with the correct person using two identifiers.   I discussed the limitations of evaluation and management by telemedicine and the availability of in person appointments. The patient expressed understanding and agreed to proceed.    I discussed the assessment and treatment plan with the patient. The patient was provided an opportunity to ask questions and all were answered. The patient agreed with the plan and demonstrated an understanding of the instructions.   The patient was advised to call back or seek an in-person evaluation if the symptoms worsen or if the condition fails to improve as anticipated.   [place] MD OP Progress Note  08/03/2020 12:06 PM Angelica Robertson  MRN:  [id number]  Chief Complaint:  Chief Complaint    Follow-up     HPI: Angelica Robertson is a 74 year old Caucasian female, married, retired, lives in [place], has a history of MDD, GAD, hyperlipidemia, arthritis was evaluated by telemedicine today.  Patient today reports she is currently doing well with regards to her anxiety and depression.  She reports she has been staying active.  She goes to [employer] to exercise Monday through Friday ,2 hours/day.  She also has been staying active doing yard work, reading books and spending time with family.  She reports she feels happy and is much better than how she felt few months ago.  She denies any significant side effects to medications as such.  She however reports it is hard for her to lose weight however her weight continues to be within the normal range.  She is compliant on medications as prescribed.  Patient denies any suicidality, homicidality or perceptual disturbances.  Patient denies any other concerns today.  Visit  Diagnosis:    ICD-10-CM   1. MDD (major depressive disorder), recurrent, in full remission ([place])  F33.42 DULoxetine (CYMBALTA) 20 MG capsule  2. GAD (generalized anxiety disorder)  F41.1 DULoxetine (CYMBALTA) 20 MG capsule    Past Psychiatric History: I have reviewed past psychiatric history from my progress note on 01/12/2020  Past Medical History:  Past Medical History:  Diagnosis Date  . Depression 2000  . Heart murmur   . Hypercholesterolemia Osteoporosis  . Hyperlipemia     Past Surgical History:  Procedure Laterality Date  . ABDOMINAL HYSTERECTOMY  1980  . COLONOSCOPY WITH PROPOFOL N/A 03/05/2018   Procedure: COLONOSCOPY WITH PROPOFOL;  Surgeon: Toledo, [name], MD;  Location: [facility] ENDOSCOPY;  Service: Endoscopy;  Laterality: N/A;    Family Psychiatric History: I have reviewed family psychiatric history from my progress note on 01/12/2020  Family History:  Family History  Problem Relation Age of Onset  . Heart disease Mother   . Diabetes Mother   . Heart disease Father   . Diabetes Father   . Colon cancer Other   . Congestive Heart Failure Brother   . Colon cancer Brother   . Mental illness Paternal Aunt   . BRCA 1/2 Neg Hx   . Breast cancer Neg Hx   . Cowden syndrome Neg Hx   . DES usage Neg Hx   . Endometrial cancer Neg Hx   . Li-Fraumeni syndrome Neg Hx   . Ovarian cancer Neg Hx     Social History: I have reviewed social history from  my progress note on 01/12/2020 Social History   Socioeconomic History  . Marital status: Married    Spouse name: robert  . Number of children: 2  . Years of education: Not on file  . Highest education level: Not on file  Occupational History  . Not on file  Tobacco Use  . Smoking status: Never Smoker  . Smokeless tobacco: Never Used  Vaping Use  . Vaping Use: Never used  Substance and Sexual Activity  . Alcohol use: Yes    Alcohol/week: 0.0 standard drinks    Comment: occasionally - socially during the summer a glass of  wine or beer  . Drug use: No  . Sexual activity: Yes  Other Topics Concern  . Not on file  Social History Narrative  . Not on file   Social Determinants of Health   Financial Resource Strain:   . Difficulty of Paying Living Expenses: Not on file  Food Insecurity:   . Worried About Charity fundraiser in the Last Year: Not on file  . Ran Out of Food in the Last Year: Not on file  Transportation Needs:   . Lack of Transportation (Medical): Not on file  . Lack of Transportation (Non-Medical): Not on file  Physical Activity:   . Days of Exercise per Week: Not on file  . Minutes of Exercise per Session: Not on file  Stress: Stress Concern Present  . Feeling of Stress : To some extent  Social Connections: Unknown  . Frequency of Communication with Friends and Family: Not on file  . Frequency of Social Gatherings with Friends and Family: Not on file  . Attends Religious Services: Not on file  . Active Member of Clubs or Organizations: Not on file  . Attends Archivist Meetings: Not on file  . Marital Status: Married    Allergies: No Known Allergies  Metabolic Disorder Labs: Lab Results  Component Value Date   HGBA1C 6.2 06/16/2020   No results found for: PROLACTIN Lab Results  Component Value Date   CHOL 184 06/16/2020   TRIG 124.0 06/16/2020   HDL 56.80 06/16/2020   CHOLHDL 3 06/16/2020   VLDL 24.8 06/16/2020   LDLCALC 103 (H) 06/16/2020   LDLCALC 78 12/18/2019   Lab Results  Component Value Date   TSH 3.160 12/18/2019   TSH 1.511 12/17/2019    Therapeutic Level Labs: No results found for: LITHIUM No results found for: VALPROATE No components found for:  CBMZ  Current Medications: Current Outpatient Medications  Medication Sig Dispense Refill  . aspirin EC 81 MG tablet Take 1 tablet (81 mg total) by mouth daily. 30 tablet 0  . calcium carbonate (OS-CAL) 600 MG TABS tablet Take 600 mg by mouth 2 (two) times daily with a meal.    . DULoxetine  (CYMBALTA) 20 MG capsule Take 1 capsule (20 mg total) by mouth daily. 90 capsule 1  . Glucosamine-Chondroitin 250-200 MG CAPS Take 1 tablet by mouth daily. TAKES 25 MCG    . mirtazapine (REMERON) 30 MG tablet Take 1 tablet (30 mg total) by mouth at bedtime. 90 tablet 1  . rosuvastatin (CRESTOR) 20 MG tablet Take 1 tablet (20 mg total) by mouth daily. 90 tablet 1   No current facility-administered medications for this visit.     Musculoskeletal: Strength & Muscle Tone: UTA Gait & Station: normal Patient leans: N/A  Psychiatric Specialty Exam: Review of Systems  Psychiatric/Behavioral: Negative for agitation, behavioral problems, confusion, decreased concentration, dysphoric mood,  hallucinations, self-injury, sleep disturbance and suicidal ideas. The patient is not nervous/anxious and is not hyperactive.   All other systems reviewed and are negative.   Last menstrual period 11/27/1979.There is no height or weight on file to calculate BMI.  General Appearance: Casual  Eye Contact:  Fair  Speech:  Normal Rate  Volume:  Normal  Mood:  Euthymic  Affect:  Congruent  Thought Process:  Goal Directed and Descriptions of Associations: Intact  Orientation:  Full (Time, Place, and Person)  Thought Content: Logical   Suicidal Thoughts:  No  Homicidal Thoughts:  No  Memory:  Immediate;   Fair Recent;   Fair Remote;   Fair  Judgement:  Fair  Insight:  Fair  Psychomotor Activity:  Normal  Concentration:  Concentration: Fair and Attention Span: Fair  Recall:  [employer] of Knowledge: Fair  Language: Fair  Akathisia:  No  Handed:  Right  AIMS (if indicated): UTA  Assets:  Communication Skills Desire for Improvement Housing Social Support  ADL's:  Intact  Cognition: WNL  Sleep:  Fair   Screenings: AUDIT     Admission (Discharged) from 12/17/2019 in [facility] Admission (Discharged) from OP Visit from 12/09/2012 in [facility]   Alcohol Use Disorder Identification Test Final Score (AUDIT) 0 0    GAD-7     Counselor from 01/14/2020 in [facility]  Total GAD-7 Score 5    Mini-Mental     Clinical Support from 02/27/2018 in [facility] from 02/02/2017 in [facility] from 02/03/2016 in [facility]  Total Score (max 30 points ) 30 30 30     PHQ2-9     Video Visit from 04/20/2020 in [facility] Counselor from 12/25/2019 in [facility] Office Visit from 10/21/2019 in [facility] Office Visit from 06/02/2019 in [facility] from 03/11/2019 in [facility]  PHQ-2 Total Score 0 3 6 0 0  PHQ-9 Total Score -- 11 17 0 --       Assessment and Plan: Angelica Robertson is a 75 year old Caucasian female, married, retired, lives in [place], has a history of depression, anxiety, hyperlipidemia, arthritis was evaluated by telemedicine today.  Patient is biologically predisposed given her history of trauma.  Patient with psychosocial stressors of the pandemic is currently stable on medications.  Discussed plan as noted below.  Plan MDD in remission We will taper off Cymbalta.  Reduce Cymbalta to 20 mg p.o. daily. Patient advised to monitor her symptoms closely and if she has any worsening depression or anxiety symptoms she will go back on Cymbalta 40 mg p.o. daily Continue mirtazapine 30 mg p.o. nightly   GAD-stable Cymbalta and Remeron as prescribed  Follow-up in clinic in 2 months or sooner if needed.  I have spent atleast 20 minutes face to face with patient today. More than 50 % of the time was spent for ordering medications and test ,psychoeducation and supportive psychotherapy and care coordination,as well as documenting clinical information in electronic health  record. This note was generated in part or whole with voice recognition software. Voice recognition is usually quite accurate but there are transcription errors that can and very often do occur. I apologize for any typographical errors that were not detected and corrected.        Ursula Alert, MD 08/03/2020, 12:06 PM

## 2020-08-10 ENCOUNTER — Ambulatory Visit (INDEPENDENT_AMBULATORY_CARE_PROVIDER_SITE_OTHER): Payer: Medicare PPO

## 2020-08-10 ENCOUNTER — Other Ambulatory Visit: Payer: Self-pay

## 2020-08-10 DIAGNOSIS — E538 Deficiency of other specified B group vitamins: Secondary | ICD-10-CM

## 2020-08-10 MED ORDER — CYANOCOBALAMIN 1000 MCG/ML IJ SOLN
1000.0000 ug | Freq: Once | INTRAMUSCULAR | Status: AC
  Administered 2020-08-10: 1000 ug via INTRAMUSCULAR

## 2020-08-10 NOTE — Progress Notes (Addendum)
Patient presented for B 12 injection to left deltoid, patient voiced no concerns nor showed any signs of distress during injection.  Reviewed.  Dr Scott 

## 2020-08-17 ENCOUNTER — Other Ambulatory Visit: Payer: Self-pay

## 2020-08-17 ENCOUNTER — Other Ambulatory Visit (INDEPENDENT_AMBULATORY_CARE_PROVIDER_SITE_OTHER): Payer: Medicare PPO

## 2020-08-17 DIAGNOSIS — E7800 Pure hypercholesterolemia, unspecified: Secondary | ICD-10-CM | POA: Diagnosis not present

## 2020-08-17 LAB — HEPATIC FUNCTION PANEL
ALT: 22 U/L (ref 0–35)
AST: 20 U/L (ref 0–37)
Albumin: 4.3 g/dL (ref 3.5–5.2)
Alkaline Phosphatase: 60 U/L (ref 39–117)
Bilirubin, Direct: 0.1 mg/dL (ref 0.0–0.3)
Total Bilirubin: 0.4 mg/dL (ref 0.2–1.2)
Total Protein: 6.3 g/dL (ref 6.0–8.3)

## 2020-08-30 ENCOUNTER — Other Ambulatory Visit: Payer: Self-pay

## 2020-09-01 ENCOUNTER — Other Ambulatory Visit: Payer: Self-pay

## 2020-09-01 ENCOUNTER — Encounter: Payer: Self-pay | Admitting: Internal Medicine

## 2020-09-01 ENCOUNTER — Ambulatory Visit: Payer: Medicare PPO | Admitting: Internal Medicine

## 2020-09-01 VITALS — BP 128/80 | HR 74 | Temp 97.4°F | Resp 16 | Ht 63.0 in | Wt 146.4 lb

## 2020-09-01 DIAGNOSIS — E538 Deficiency of other specified B group vitamins: Secondary | ICD-10-CM | POA: Diagnosis not present

## 2020-09-01 DIAGNOSIS — E7800 Pure hypercholesterolemia, unspecified: Secondary | ICD-10-CM

## 2020-09-01 DIAGNOSIS — M542 Cervicalgia: Secondary | ICD-10-CM

## 2020-09-01 DIAGNOSIS — R739 Hyperglycemia, unspecified: Secondary | ICD-10-CM | POA: Diagnosis not present

## 2020-09-01 DIAGNOSIS — Z23 Encounter for immunization: Secondary | ICD-10-CM | POA: Diagnosis not present

## 2020-09-01 DIAGNOSIS — R030 Elevated blood-pressure reading, without diagnosis of hypertension: Secondary | ICD-10-CM | POA: Diagnosis not present

## 2020-09-01 DIAGNOSIS — F332 Major depressive disorder, recurrent severe without psychotic features: Secondary | ICD-10-CM

## 2020-09-01 NOTE — Assessment & Plan Note (Signed)
On crestor now.  Low cholesterol diet and exercise.  Follow lipid panel and liver function tests.  

## 2020-09-01 NOTE — Assessment & Plan Note (Signed)
Recheck B12 level with next labs.  She is receiving B12 injections.

## 2020-09-01 NOTE — Progress Notes (Signed)
Patient ID: Angelica Robertson, female   DOB: 05/20/1946, 74 y.o.   MRN: [id number]   Subjective:    Patient ID: Angelica Robertson, female    DOB: 1946/03/06, 74 y.o.   MRN: [id number]  HPI This visit occurred during the SARS-CoV-2 public health emergency.  Safety protocols were in place, including screening questions prior to the visit, additional usage of staff PPE, and extensive cleaning of exam room while observing appropriate contact time as indicated for disinfecting solutions.  Patient here for a scheduled follow up.  Here to f/u regarding her blood pressure.  Reviewed outside readings.  Blood pressures averaging mostly 120s- low 130s/60-70s.  Is exercising.  Feeling better. No neck pain.  No chest pain or sob.  No acid reflux or abdominal pain reported. Bowels moving.  Handling stress.  Seeing Dr Su Hoff.  Doing well. cymbalta dose decreased to 68m q day.    Past Medical History:  Diagnosis Date  . Depression 2000  . Heart murmur   . Hypercholesterolemia Osteoporosis  . Hyperlipemia    Past Surgical History:  Procedure Laterality Date  . ABDOMINAL HYSTERECTOMY  1980  . COLONOSCOPY WITH PROPOFOL N/A 03/05/2018   Procedure: COLONOSCOPY WITH PROPOFOL;  Surgeon: Toledo, TBenay Pike MD;  Location: [facility] ENDOSCOPY;  Service: Endoscopy;  Laterality: N/A;   Family History  Problem Relation Age of Onset  . Heart disease Mother   . Diabetes Mother   . Heart disease Father   . Diabetes Father   . Colon cancer Other   . Congestive Heart Failure Brother   . Colon cancer Brother   . Mental illness Paternal Aunt   . BRCA 1/2 Neg Hx   . Breast cancer Neg Hx   . Cowden syndrome Neg Hx   . DES usage Neg Hx   . Endometrial cancer Neg Hx   . Li-Fraumeni syndrome Neg Hx   . Ovarian cancer Neg Hx    Social History   Socioeconomic History  . Marital status: Married    Spouse name: robert  . Number of children: 2  . Years of education: Not on file  . Highest education level: Not on file  Occupational  History  . Not on file  Tobacco Use  . Smoking status: Never Smoker  . Smokeless tobacco: Never Used  Vaping Use  . Vaping Use: Never used  Substance and Sexual Activity  . Alcohol use: Yes    Alcohol/week: 0.0 standard drinks    Comment: occasionally - socially during the summer a glass of wine or beer  . Drug use: No  . Sexual activity: Yes  Other Topics Concern  . Not on file  Social History Narrative  . Not on file   Social Determinants of Health   Financial Resource Strain:   . Difficulty of Paying Living Expenses: Not on file  Food Insecurity:   . Worried About RCharity fundraiserin the Last Year: Not on file  . Ran Out of Food in the Last Year: Not on file  Transportation Needs:   . Lack of Transportation (Medical): Not on file  . Lack of Transportation (Non-Medical): Not on file  Physical Activity:   . Days of Exercise per Week: Not on file  . Minutes of Exercise per Session: Not on file  Stress: Stress Concern Present  . Feeling of Stress : To some extent  Social Connections: Unknown  . Frequency of Communication with Friends and Family: Not on file  . Frequency of  Social Gatherings with Friends and Family: Not on file  . Attends Religious Services: Not on file  . Active Member of Clubs or Organizations: Not on file  . Attends Archivist Meetings: Not on file  . Marital Status: Married    Outpatient Encounter Medications as of 09/01/2020  Medication Sig  . aspirin EC 81 MG tablet Take 1 tablet (81 mg total) by mouth daily.  . calcium carbonate (OS-CAL) 600 MG TABS tablet Take 600 mg by mouth 2 (two) times daily with a meal.  . DULoxetine (CYMBALTA) 20 MG capsule Take 1 capsule (20 mg total) by mouth daily.  . Glucosamine-Chondroitin 250-200 MG CAPS Take 1 tablet by mouth daily. TAKES 25 MCG  . mirtazapine (REMERON) 30 MG tablet Take 1 tablet (30 mg total) by mouth at bedtime.  . rosuvastatin (CRESTOR) 20 MG tablet Take 1 tablet (20 mg total) by  mouth daily.   No facility-administered encounter medications on file as of 09/01/2020.    Review of Systems  Constitutional: Negative for appetite change and unexpected weight change.  HENT: Negative for congestion and sinus pressure.   Respiratory: Negative for cough, chest tightness and shortness of breath.   Cardiovascular: Negative for chest pain, palpitations and leg swelling.  Gastrointestinal: Negative for abdominal pain, diarrhea, nausea and vomiting.  Genitourinary: Negative for difficulty urinating and dysuria.  Musculoskeletal: Negative for joint swelling and myalgias.       Neck pain improved.    Skin: Negative for color change and rash.  Neurological: Negative for dizziness, light-headedness and headaches.  Psychiatric/Behavioral: Negative for agitation and dysphoric mood.       Objective:    Physical Exam Vitals reviewed.  Constitutional:      General: She is not in acute distress.    Appearance: Normal appearance.  HENT:     Head: Normocephalic and atraumatic.     Right Ear: External ear normal.     Left Ear: External ear normal.  Eyes:     General: No scleral icterus.       Right eye: No discharge.        Left eye: No discharge.     Conjunctiva/sclera: Conjunctivae normal.  Neck:     Thyroid: No thyromegaly.  Cardiovascular:     Rate and Rhythm: Normal rate and regular rhythm.  Pulmonary:     Effort: No respiratory distress.     Breath sounds: Normal breath sounds. No wheezing.  Abdominal:     General: Bowel sounds are normal.     Palpations: Abdomen is soft.     Tenderness: There is no abdominal tenderness.  Musculoskeletal:        General: No swelling or tenderness.     Cervical back: Neck supple. No tenderness.  Lymphadenopathy:     Cervical: No cervical adenopathy.  Skin:    Findings: No erythema or rash.  Neurological:     Mental Status: She is alert.  Psychiatric:        Mood and Affect: Mood normal.        Behavior: Behavior normal.      BP 128/80   Pulse 74   Temp (!) 97.4 F (36.3 C) (Oral)   Resp 16   Ht _0  (1.6 m)   Wt 146 lb 6.4 oz (66.4 kg)   LMP 11/27/1979   SpO2 98%   BMI 25.93 kg/m  Wt Readings from Last 3 Encounters:  09/01/20 146 lb 6.4 oz (66.4 kg)  07/05/20 144 lb (  65.3 kg)  02/10/20 135 lb (61.2 kg)     Lab Results  Component Value Date   WBC 9.0 01/06/2020   HGB 13.7 01/06/2020   HCT 41.5 01/06/2020   PLT 374.0 01/06/2020   GLUCOSE 112 (H) 06/16/2020   CHOL 184 06/16/2020   TRIG 124.0 06/16/2020   HDL 56.80 06/16/2020   LDLCALC 103 (H) 06/16/2020   ALT 22 08/17/2020   AST 20 08/17/2020   NA 140 06/16/2020   K 4.5 06/16/2020   CL 102 06/16/2020   CREATININE 0.84 06/16/2020   BUN 19 06/16/2020   CO2 31 06/16/2020   TSH 3.160 12/18/2019   HGBA1C 6.2 06/16/2020    MM 3D SCREEN BREAST BILATERAL  Result Date: 06/15/2020 CLINICAL DATA:  Screening. EXAM: DIGITAL SCREENING BILATERAL MAMMOGRAM WITH TOMO AND CAD COMPARISON:  Previous exam(s). ACR Breast Density Category b: There are scattered areas of fibroglandular density. FINDINGS: There are no findings suspicious for malignancy. Images were processed with CAD. IMPRESSION: No mammographic evidence of malignancy. A result letter of this screening mammogram will be mailed directly to the patient. RECOMMENDATION: Screening mammogram in one year. (Code:SM-B-01Y) BI-RADS CATEGORY  1: Negative. Electronically Signed   By: Audie Pinto M.D.   On: 06/15/2020 13:44       Assessment & Plan:   Problem List Items Addressed This Visit    Neck pain    Previously saw neurosurgery.  Has seen Dr Sharlet Salina.  Doing better now.  Follow.       MDD (major depressive disorder), recurrent severe, without psychosis ([place])    Doing much better.  On cymbalta.  Seeing Dr Budd Palmer.  Recently decreased dose of cymbalta to 7m q day.  Follow.        Hyperglycemia    Low carb diet and exercise.  Follow met b and a1c.       Relevant Orders   Hemoglobin  A1c   Hypercholesterolemia    On crestor now.  Low cholesterol diet and exercise.  Follow lipid panel and liver function tests.        Relevant Orders   Hepatic function panel   Lipid panel   CBC with Differential/Platelet   TSH   Basic metabolic panel   Elevated blood pressure reading    Outside checks as outlined.  Hold on medication.  Overall has improved.  Continue to have her spot check her pressures.  Follow.       B12 deficiency    Recheck B12 level with next labs.  She is receiving B12 injections.         Other Visit Diagnoses    Need for immunization against influenza    -  Primary   Relevant Orders   Flu Vaccine QUAD High Dose(Fluad) (Completed)       CEinar Pheasant MD

## 2020-09-01 NOTE — Assessment & Plan Note (Signed)
Outside checks as outlined.  Hold on medication.  Overall has improved.  Continue to have her spot check her pressures.  Follow.

## 2020-09-01 NOTE — Assessment & Plan Note (Signed)
Doing much better.  On cymbalta.  Seeing Dr Marella Bile.  Recently decreased dose of cymbalta to 20mg  q day.  Follow.

## 2020-09-01 NOTE — Assessment & Plan Note (Signed)
Low carb diet and exercise.  Follow met b and a1c.  

## 2020-09-01 NOTE — Assessment & Plan Note (Signed)
Previously saw neurosurgery.  Has seen Dr Yves Dill.  Doing better now.  Follow.

## 2020-09-09 ENCOUNTER — Other Ambulatory Visit: Payer: Self-pay

## 2020-09-09 ENCOUNTER — Ambulatory Visit (INDEPENDENT_AMBULATORY_CARE_PROVIDER_SITE_OTHER): Payer: Medicare PPO

## 2020-09-09 DIAGNOSIS — E538 Deficiency of other specified B group vitamins: Secondary | ICD-10-CM | POA: Diagnosis not present

## 2020-09-09 MED ORDER — CYANOCOBALAMIN 1000 MCG/ML IJ SOLN
1000.0000 ug | Freq: Once | INTRAMUSCULAR | Status: AC
  Administered 2020-09-09: 1000 ug via INTRAMUSCULAR

## 2020-09-09 NOTE — Progress Notes (Addendum)
Patient presented for B 12 injection to right deltoid, patient voiced no concerns nor showed any signs of distress during injection.  Reviewed.  Dr Scott 

## 2020-10-04 ENCOUNTER — Telehealth (INDEPENDENT_AMBULATORY_CARE_PROVIDER_SITE_OTHER): Payer: Medicare PPO | Admitting: Psychiatry

## 2020-10-04 ENCOUNTER — Other Ambulatory Visit: Payer: Self-pay

## 2020-10-04 ENCOUNTER — Encounter: Payer: Self-pay | Admitting: Psychiatry

## 2020-10-04 DIAGNOSIS — F411 Generalized anxiety disorder: Secondary | ICD-10-CM

## 2020-10-04 DIAGNOSIS — F3342 Major depressive disorder, recurrent, in full remission: Secondary | ICD-10-CM | POA: Diagnosis not present

## 2020-10-04 MED ORDER — DULOXETINE HCL 20 MG PO CPEP: 20.0000 mg | ORAL_CAPSULE | Freq: Every day | ORAL | 1 refills | Status: DC

## 2020-10-04 MED ORDER — MIRTAZAPINE 30 MG PO TABS: 30.0000 mg | ORAL_TABLET | Freq: Every day | ORAL | 1 refills | Status: DC

## 2020-10-04 NOTE — Progress Notes (Signed)
Virtual Visit via Video Note  I connected with Angelica Robertson on 10/04/20 at  2:00 PM EDT by a video enabled telemedicine application and verified that I am speaking with the correct person using two identifiers.  Location Provider Location : [facility] Patient Location : Home  Participants: Patient , Provider    I discussed the limitations of evaluation and management by telemedicine and the availability of in person appointments. The patient expressed understanding and agreed to proceed.     I discussed the assessment and treatment plan with the patient. The patient was provided an opportunity to ask questions and all were answered. The patient agreed with the plan and demonstrated an understanding of the instructions.   The patient was advised to call back or seek an in-person evaluation if the symptoms worsen or if the condition fails to improve as anticipated.  [facility] MD OP Progress Note  10/04/2020 2:14 PM Angelica Robertson  MRN:  [id number]  Chief Complaint:  Chief Complaint    Follow-up     HPI: Angelica Robertson is a 74 year old Caucasian female, married, retired, lives in [place], has a history of MDD, GAD, hyperlipidemia, arthritis was evaluated by telemedicine today.  Patient today reports she is currently doing well on the current reduced dosage of Cymbalta.  She continues to go to [employer] to exercise Monday through Friday.  She also continues to stay active during the day and has been spending time reading books and spending time with family.  She reports sleep is good.  She continues to be compliant on the mirtazapine.  Denies side effects.  Patient denies any suicidality, homicidality or perceptual disturbances.  Patient denies any other concerns today.  Visit Diagnosis:    ICD-10-CM   1. MDD (major depressive disorder), recurrent, in full remission ([place])  F33.42 Angelica (CYMBALTA) 20 MG capsule    mirtazapine (REMERON) 30 MG tablet  2. GAD (generalized anxiety  disorder)  F41.1 Angelica (CYMBALTA) 20 MG capsule    mirtazapine (REMERON) 30 MG tablet    Past Psychiatric History: I have reviewed past psychiatric history from my progress note on 01/12/2020.  Past Medical History:  Past Medical History:  Diagnosis Date  . Depression 2000  . Heart murmur   . Hypercholesterolemia Osteoporosis  . Hyperlipemia     Past Surgical History:  Procedure Laterality Date  . ABDOMINAL HYSTERECTOMY  1980  . COLONOSCOPY WITH PROPOFOL N/A 03/05/2018   Procedure: COLONOSCOPY WITH PROPOFOL;  Surgeon: Toledo, [name], MD;  Location: [facility] ENDOSCOPY;  Service: Endoscopy;  Laterality: N/A;    Family Psychiatric History: I have reviewed family psychiatric history from my progress note on 01/12/2020.  Family History:  Family History  Problem Relation Age of Onset  . Heart disease Mother   . Diabetes Mother   . Heart disease Father   . Diabetes Father   . Colon cancer Other   . Congestive Heart Failure Brother   . Colon cancer Brother   . Mental illness Paternal Aunt   . BRCA 1/2 Neg Hx   . Breast cancer Neg Hx   . Cowden syndrome Neg Hx   . DES usage Neg Hx   . Endometrial cancer Neg Hx   . Li-Fraumeni syndrome Neg Hx   . Ovarian cancer Neg Hx     Social History: I have reviewed social history from my progress note on 01/12/2020. Social History   Socioeconomic History  . Marital status: Married    Spouse name: robert  .  Number of children: 2  . Years of education: Not on file  . Highest education level: Not on file  Occupational History  . Not on file  Tobacco Use  . Smoking status: Never Smoker  . Smokeless tobacco: Never Used  Vaping Use  . Vaping Use: Never used  Substance and Sexual Activity  . Alcohol use: Yes    Alcohol/week: 0.0 standard drinks    Comment: occasionally - socially during the summer a glass of wine or beer  . Drug use: No  . Sexual activity: Yes  Other Topics Concern  . Not on file  Social History Narrative  . Not  on file   Social Determinants of Health   Financial Resource Strain:   . Difficulty of Paying Living Expenses: Not on file  Food Insecurity:   . Worried About Charity fundraiser in the Last Year: Not on file  . Ran Out of Food in the Last Year: Not on file  Transportation Needs:   . Lack of Transportation (Medical): Not on file  . Lack of Transportation (Non-Medical): Not on file  Physical Activity:   . Days of Exercise per Week: Not on file  . Minutes of Exercise per Session: Not on file  Stress: Stress Concern Present  . Feeling of Stress : To some extent  Social Connections: Unknown  . Frequency of Communication with Friends and Family: Not on file  . Frequency of Social Gatherings with Friends and Family: Not on file  . Attends Religious Services: Not on file  . Active Member of Clubs or Organizations: Not on file  . Attends Archivist Meetings: Not on file  . Marital Status: Married    Allergies: No Known Allergies  Metabolic Disorder Labs: Lab Results  Component Value Date   HGBA1C 6.2 06/16/2020   No results found for: PROLACTIN Lab Results  Component Value Date   CHOL 184 06/16/2020   TRIG 124.0 06/16/2020   HDL 56.80 06/16/2020   CHOLHDL 3 06/16/2020   VLDL 24.8 06/16/2020   LDLCALC 103 (H) 06/16/2020   LDLCALC 78 12/18/2019   Lab Results  Component Value Date   TSH 3.160 12/18/2019   TSH 1.511 12/17/2019    Therapeutic Level Labs: No results found for: LITHIUM No results found for: VALPROATE No components found for:  CBMZ  Current Medications: Current Outpatient Medications  Medication Sig Dispense Refill  . aspirin EC 81 MG tablet Take 1 tablet (81 mg total) by mouth daily. 30 tablet 0  . calcium carbonate (OS-CAL) 600 MG TABS tablet Take 600 mg by mouth 2 (two) times daily with a meal.    . Angelica (CYMBALTA) 20 MG capsule Take 1 capsule (20 mg total) by mouth daily. Start taking every other day for the next two weeks and stop  taking it 90 capsule 1  . Glucosamine-Chondroitin 250-200 MG CAPS Take 1 tablet by mouth daily. TAKES 25 MCG    . mirtazapine (REMERON) 30 MG tablet Take 1 tablet (30 mg total) by mouth at bedtime. 90 tablet 1  . rosuvastatin (CRESTOR) 20 MG tablet Take 1 tablet (20 mg total) by mouth daily. 90 tablet 1   No current facility-administered medications for this visit.     Musculoskeletal: Strength & Muscle Tone: UTA Gait & Station: normal Patient leans: N/A  Psychiatric Specialty Exam: Review of Systems  Psychiatric/Behavioral: Negative for agitation, behavioral problems, confusion, decreased concentration, dysphoric mood, hallucinations, self-injury, sleep disturbance and suicidal ideas. The patient is  not nervous/anxious and is not hyperactive.   All other systems reviewed and are negative.   Last menstrual period 11/27/1979.There is no height or weight on file to calculate BMI.  General Appearance: Casual  Eye Contact:  Fair  Speech:  Clear and Coherent  Volume:  Normal  Mood:  Euthymic  Affect:  Congruent  Thought Process:  Goal Directed and Descriptions of Associations: Intact  Orientation:  Full (Time, Place, and Person)  Thought Content: Logical   Suicidal Thoughts:  No  Homicidal Thoughts:  No  Memory:  Immediate;   Fair Recent;   Fair Remote;   Fair  Judgement:  Fair  Insight:  Fair  Psychomotor Activity:  Normal  Concentration:  Concentration: Fair and Attention Span: Fair  Recall:  [employer] of Knowledge: Fair  Language: Fair  Akathisia:  No  Handed:  Right  AIMS (if indicated): UTA  Assets:  Communication Skills Desire for Improvement Housing Social Support  ADL's:  Intact  Cognition: WNL  Sleep:  Fair   Screenings: AUDIT     Admission (Discharged) from 12/17/2019 in [facility] Admission (Discharged) from OP Visit from 12/09/2012 in [facility]  Alcohol Use Disorder Identification Test Final Score  (AUDIT) 0 0    GAD-7     Counselor from 01/14/2020 in [facility]  Total GAD-7 Score 5    Mini-Mental     Clinical Support from 02/27/2018 in [facility] from 02/02/2017 in [facility] from 02/03/2016 in [facility]  Total Score (max 30 points ) 30 30 30     PHQ2-9     Video Visit from 10/04/2020 in [facility] Video Visit from 04/20/2020 in [facility] Counselor from 12/25/2019 in [facility] Office Visit from 10/21/2019 in [facility] Office Visit from 06/02/2019 in [facility]  PHQ-2 Total Score 0 0 3 6 0  PHQ-9 Total Score -- -- 11 17 0       Assessment and Plan: CONCHITA TRUXILLO is a 74 year old Caucasian female, married, retired, lives in [place], has a history of MDD, anxiety, hyperlipidemia, arthritis was evaluated by telemedicine today.  Patient is biologically predisposed given her history of trauma.  Patient with psychosocial stressors of the pandemic is currently stable on current medication regimen.  Plan as noted below.  Plan MDD in remission Discussed with patient to start taking Cymbalta 20 mg p.o. every other day for the next 2 weeks and stop taking it. Continue Remeron 30 mg p.o. nightly.  GAD-stable Remeron as prescribed.  PHQ 2-0 today.  Follow-up in clinic in 3 months or sooner if needed.  I have spent atleast 20 minutes face to face by video with patient today. More than 50 % of the time was spent for preparing to see the patient ( e.g., review of test, records ), ordering medications and test ,psychoeducation and supportive psychotherapy and care coordination,as well as documenting clinical information in electronic health record. This note was generated in part or whole with voice recognition software. Voice  recognition is usually quite accurate but there are transcription errors that can and very often do occur. I apologize for any typographical errors that were not detected and corrected.        Ursula Alert, MD 10/05/2020, 8:24 AM

## 2020-10-12 ENCOUNTER — Ambulatory Visit (INDEPENDENT_AMBULATORY_CARE_PROVIDER_SITE_OTHER): Payer: Medicare PPO

## 2020-10-12 ENCOUNTER — Other Ambulatory Visit: Payer: Self-pay

## 2020-10-12 DIAGNOSIS — E538 Deficiency of other specified B group vitamins: Secondary | ICD-10-CM

## 2020-10-12 MED ORDER — CYANOCOBALAMIN 1000 MCG/ML IJ SOLN
1000.0000 ug | Freq: Once | INTRAMUSCULAR | Status: AC
  Administered 2020-10-12: 1000 ug via INTRAMUSCULAR

## 2020-10-12 NOTE — Progress Notes (Addendum)
Patient presented for B 12 injection to left deltoid, patient voiced no concerns nor showed any signs of distress during injection.  Reviewed.  Dr Scott 

## 2020-11-16 ENCOUNTER — Other Ambulatory Visit: Payer: Self-pay

## 2020-11-16 ENCOUNTER — Ambulatory Visit (INDEPENDENT_AMBULATORY_CARE_PROVIDER_SITE_OTHER): Payer: Medicare PPO

## 2020-11-16 DIAGNOSIS — E538 Deficiency of other specified B group vitamins: Secondary | ICD-10-CM

## 2020-11-16 MED ORDER — CYANOCOBALAMIN 1000 MCG/ML IJ SOLN
1000.0000 ug | Freq: Once | INTRAMUSCULAR | Status: AC
  Administered 2020-11-16: 1000 ug via INTRAMUSCULAR

## 2020-11-16 NOTE — Progress Notes (Addendum)
Patient presented for B 12 injection to left deltoid, patient voiced no concerns nor showed any signs of distress during injection.  Reviewed.  Dr Scott 

## 2020-11-29 ENCOUNTER — Other Ambulatory Visit (INDEPENDENT_AMBULATORY_CARE_PROVIDER_SITE_OTHER): Payer: Medicare PPO

## 2020-11-29 ENCOUNTER — Other Ambulatory Visit: Payer: Self-pay

## 2020-11-29 DIAGNOSIS — R739 Hyperglycemia, unspecified: Secondary | ICD-10-CM

## 2020-11-29 DIAGNOSIS — E7800 Pure hypercholesterolemia, unspecified: Secondary | ICD-10-CM

## 2020-11-29 LAB — BASIC METABOLIC PANEL
BUN: 16 mg/dL (ref 6–23)
CO2: 29 mEq/L (ref 19–32)
Calcium: 9.8 mg/dL (ref 8.4–10.5)
Chloride: 104 mEq/L (ref 96–112)
Creatinine, Ser: 0.79 mg/dL (ref 0.40–1.20)
GFR: 74.79 mL/min (ref >60.00)
Glucose, Bld: 100 mg/dL — ABNORMAL HIGH (ref 70–99)
Potassium: 4.4 mEq/L (ref 3.5–5.1)
Sodium: 140 mEq/L (ref 135–145)

## 2020-11-29 LAB — LIPID PANEL
Cholesterol: 163 mg/dL (ref 0–200)
HDL: 52.3 mg/dL (ref >39.00)
LDL Cholesterol: 71 mg/dL (ref 0–99)
NonHDL: 110.85
Total CHOL/HDL Ratio: 3
Triglycerides: 197 mg/dL — ABNORMAL HIGH (ref 0.0–149.0)
VLDL: 39.4 mg/dL (ref 0.0–40.0)

## 2020-11-29 LAB — CBC WITH DIFFERENTIAL/PLATELET
Basophils Absolute: 0.1 10*3/uL (ref 0.0–0.1)
Basophils Relative: 1.2 % (ref 0.0–3.0)
Eosinophils Absolute: 0.3 10*3/uL (ref 0.0–0.7)
Eosinophils Relative: 5.1 % — ABNORMAL HIGH (ref 0.0–5.0)
HCT: 39.8 % (ref 36.0–46.0)
Hemoglobin: 13.6 g/dL (ref 12.0–15.0)
Lymphocytes Relative: 32.7 % (ref 12.0–46.0)
Lymphs Abs: 1.8 10*3/uL (ref 0.7–4.0)
MCHC: 34.2 g/dL (ref 30.0–36.0)
MCV: 86.8 fl (ref 78.0–100.0)
Monocytes Absolute: 0.5 10*3/uL (ref 0.1–1.0)
Monocytes Relative: 8.3 % (ref 3.0–12.0)
Neutro Abs: 3 10*3/uL (ref 1.4–7.7)
Neutrophils Relative %: 52.7 % (ref 43.0–77.0)
Platelets: 270 10*3/uL (ref 150.0–400.0)
RBC: 4.59 Mil/uL (ref 3.87–5.11)
RDW: 12.9 % (ref 11.5–15.5)
WBC: 5.6 10*3/uL (ref 4.0–10.5)

## 2020-11-29 LAB — HEPATIC FUNCTION PANEL
ALT: 25 U/L (ref 0–35)
AST: 21 U/L (ref 0–37)
Albumin: 4.3 g/dL (ref 3.5–5.2)
Alkaline Phosphatase: 62 U/L (ref 39–117)
Bilirubin, Direct: 0.1 mg/dL (ref 0.0–0.3)
Total Bilirubin: 0.5 mg/dL (ref 0.2–1.2)
Total Protein: 6.5 g/dL (ref 6.0–8.3)

## 2020-11-29 LAB — HEMOGLOBIN A1C: Hgb A1c MFr Bld: 6.2 % (ref 4.6–6.5)

## 2020-11-29 LAB — TSH: TSH: 3.32 u[IU]/mL (ref 0.35–4.50)

## 2020-12-01 ENCOUNTER — Ambulatory Visit: Payer: Medicare PPO | Admitting: Internal Medicine

## 2020-12-01 ENCOUNTER — Other Ambulatory Visit: Payer: Self-pay

## 2020-12-01 ENCOUNTER — Encounter: Payer: Self-pay | Admitting: Internal Medicine

## 2020-12-01 VITALS — BP 122/70 | HR 80 | Temp 98.2°F | Resp 16 | Ht 63.0 in | Wt 146.0 lb

## 2020-12-01 DIAGNOSIS — F419 Anxiety disorder, unspecified: Secondary | ICD-10-CM | POA: Diagnosis not present

## 2020-12-01 DIAGNOSIS — R739 Hyperglycemia, unspecified: Secondary | ICD-10-CM

## 2020-12-01 DIAGNOSIS — I10 Essential (primary) hypertension: Secondary | ICD-10-CM

## 2020-12-01 DIAGNOSIS — E7800 Pure hypercholesterolemia, unspecified: Secondary | ICD-10-CM | POA: Diagnosis not present

## 2020-12-01 DIAGNOSIS — F3341 Major depressive disorder, recurrent, in partial remission: Secondary | ICD-10-CM | POA: Diagnosis not present

## 2020-12-01 MED ORDER — LOSARTAN POTASSIUM 25 MG PO TABS: 25.0000 mg | ORAL_TABLET | Freq: Every day | ORAL | 2 refills | Status: DC

## 2020-12-01 NOTE — Progress Notes (Signed)
Patient ID: Angelica Robertson, female   DOB: 07-28-1946, 74 y.o.   MRN: [id number]   Subjective:    Patient ID: Angelica Robertson, female    DOB: [birth year], 74 y.o.   MRN: [id number]  HPI This visit occurred during the SARS-CoV-2 public health emergency.  Safety protocols were in place, including screening questions prior to the visit, additional usage of staff PPE, and extensive cleaning of exam room while observing appropriate contact time as indicated for disinfecting solutions.  Patient here for a scheduled follow up.  Here to follow up regarding her blood pressure.  Brings in list of outside readings.  Blood pressures 120-140.70-80s.  Discussed new guidelines for blood pressure.  Agreeable to start medication.  No chest pain or sob reported.  Exercises regularly.  No acid reflux.  No abdominal pain or cramping.  Bowels moving.  Off cymbalta.  Seeing Dr Shea Evans.  Feeling much better.   Past Medical History:  Diagnosis Date  . Depression 2000  . Heart murmur   . Hypercholesterolemia Osteoporosis  . Hyperlipemia    Past Surgical History:  Procedure Laterality Date  . ABDOMINAL HYSTERECTOMY  1980  . COLONOSCOPY WITH PROPOFOL N/A 03/05/2018   Procedure: COLONOSCOPY WITH PROPOFOL;  Surgeon: Toledo, [name], MD;  Location: [facility] ENDOSCOPY;  Service: Endoscopy;  Laterality: N/A;   Family History  Problem Relation Age of Onset  . Heart disease Mother   . Diabetes Mother   . Heart disease Father   . Diabetes Father   . Colon cancer Other   . Congestive Heart Failure Brother   . Colon cancer Brother   . Mental illness Paternal Aunt   . BRCA 1/2 Neg Hx   . Breast cancer Neg Hx   . Cowden syndrome Neg Hx   . DES usage Neg Hx   . Endometrial cancer Neg Hx   . Li-Fraumeni syndrome Neg Hx   . Ovarian cancer Neg Hx    Social History   Socioeconomic History  . Marital status: Married    Spouse name: robert  . Number of children: 2  . Years of education: Not on file  . Highest education level:  Not on file  Occupational History  . Not on file  Tobacco Use  . Smoking status: Never Smoker  . Smokeless tobacco: Never Used  Vaping Use  . Vaping Use: Never used  Substance and Sexual Activity  . Alcohol use: Yes    Alcohol/week: 0.0 standard drinks    Comment: occasionally - socially during the summer a glass of wine or beer  . Drug use: No  . Sexual activity: Yes  Other Topics Concern  . Not on file  Social History Narrative  . Not on file   Social Determinants of Health   Financial Resource Strain: Not on file  Food Insecurity: Not on file  Transportation Needs: Not on file  Physical Activity: Not on file  Stress: Not on file  Social Connections: Not on file    Outpatient Encounter Medications as of 12/01/2020  Medication Sig  . aspirin EC 81 MG tablet Take 1 tablet (81 mg total) by mouth daily.  . calcium carbonate (OS-CAL) 600 MG TABS tablet Take 600 mg by mouth 2 (two) times daily with a meal.  . Glucosamine-Chondroitin 250-200 MG CAPS Take 1 tablet by mouth daily. TAKES 25 MCG  . losartan (COZAAR) 25 MG tablet Take 1 tablet (25 mg total) by mouth daily.  . mirtazapine (REMERON) 30 MG tablet Take  1 tablet (30 mg total) by mouth at bedtime.  . rosuvastatin (CRESTOR) 20 MG tablet Take 1 tablet (20 mg total) by mouth daily.  . [DISCONTINUED] DULoxetine (CYMBALTA) 20 MG capsule Take 1 capsule (20 mg total) by mouth daily. Start taking every other day for the next two weeks and stop taking it   No facility-administered encounter medications on file as of 12/01/2020.    Review of Systems  Constitutional: Negative for appetite change and unexpected weight change.  HENT: Negative for congestion.   Respiratory: Negative for cough, chest tightness and shortness of breath.   Cardiovascular: Negative for chest pain, palpitations and leg swelling.  Gastrointestinal: Negative for abdominal pain, diarrhea, nausea and vomiting.  Genitourinary: Negative for difficulty  urinating and dysuria.  Musculoskeletal: Negative for joint swelling and myalgias.  Skin: Negative for color change and rash.  Neurological: Negative for dizziness, light-headedness and headaches.  Psychiatric/Behavioral: Negative for agitation and dysphoric mood.       Objective:    Physical Exam Vitals reviewed.  Constitutional:      General: She is not in acute distress.    Appearance: Normal appearance.  HENT:     Head: Normocephalic and atraumatic.     Right Ear: External ear normal.     Left Ear: External ear normal.     Mouth/Throat:     Mouth: Oropharynx is clear and moist.  Eyes:     General: No scleral icterus.       Right eye: No discharge.        Left eye: No discharge.     Conjunctiva/sclera: Conjunctivae normal.  Neck:     Thyroid: No thyromegaly.  Cardiovascular:     Rate and Rhythm: Normal rate and regular rhythm.  Pulmonary:     Effort: No respiratory distress.     Breath sounds: Normal breath sounds. No wheezing.  Abdominal:     General: Bowel sounds are normal.     Palpations: Abdomen is soft.     Tenderness: There is no abdominal tenderness.  Musculoskeletal:        General: No swelling, tenderness or edema.     Cervical back: Neck supple. No tenderness.  Lymphadenopathy:     Cervical: No cervical adenopathy.  Skin:    Findings: No erythema or rash.  Neurological:     Mental Status: She is alert.  Psychiatric:        Mood and Affect: Mood normal.        Behavior: Behavior normal.     BP 122/70   Pulse 80   Temp 98.2 F (36.8 C) (Oral)   Resp 16   Ht 5' 3"  (1.6 m)   Wt 146 lb (66.2 kg)   LMP 11/27/1979   SpO2 98%   BMI 25.86 kg/m  Wt Readings from Last 3 Encounters:  12/01/20 146 lb (66.2 kg)  09/01/20 146 lb 6.4 oz (66.4 kg)  07/05/20 144 lb (65.3 kg)     Lab Results  Component Value Date   WBC 5.6 11/29/2020   HGB 13.6 11/29/2020   HCT 39.8 11/29/2020   PLT 270.0 11/29/2020   GLUCOSE 100 (H) 11/29/2020   CHOL 163  11/29/2020   TRIG 197.0 (H) 11/29/2020   HDL 52.30 11/29/2020   LDLCALC 71 11/29/2020   ALT 25 11/29/2020   AST 21 11/29/2020   NA 140 11/29/2020   K 4.4 11/29/2020   CL 104 11/29/2020   CREATININE 0.79 11/29/2020   BUN 16 11/29/2020  CO2 29 11/29/2020   TSH 3.32 11/29/2020   HGBA1C 6.2 11/29/2020    MM 3D SCREEN BREAST BILATERAL  Result Date: 06/15/2020 CLINICAL DATA:  Screening. EXAM: DIGITAL SCREENING BILATERAL MAMMOGRAM WITH TOMO AND CAD COMPARISON:  Previous exam(s). ACR Breast Density Category b: There are scattered areas of fibroglandular density. FINDINGS: There are no findings suspicious for malignancy. Images were processed with CAD. IMPRESSION: No mammographic evidence of malignancy. A result letter of this screening mammogram will be mailed directly to the patient. RECOMMENDATION: Screening mammogram in one year. (Code:SM-B-01Y) BI-RADS CATEGORY  1: Negative. Electronically Signed   By: Audie Pinto M.D.   On: 06/15/2020 13:44       Assessment & Plan:   Problem List Items Addressed This Visit    Depression    Off cymbalta.  On remeron.  Seeing psychiatry. Doing better.  Follow.        Hypercholesterolemia    On crestor.  Low cholesterol diet and exercise.  Follow lipid panel and liver function tests.        Relevant Medications   losartan (COZAAR) 25 MG tablet   Hyperglycemia    Low carb diet and exercise.  Follow met b and a1c.       Anxiety    Off cymbalta.  On remeron. Doing well.  Follow.  Continue f/u with psychiatry.       Hypertension - Primary    Blood pressure as outlined.  Persistent elevation.  Start losartan.  Check metabolic panel in 32-75 days.  Follow pressures.  Follow metabolic panel.       Relevant Medications   losartan (COZAAR) 25 MG tablet   Other Relevant Orders   Basic metabolic panel       Einar Pheasant, MD

## 2020-12-08 ENCOUNTER — Encounter: Payer: Self-pay | Admitting: Internal Medicine

## 2020-12-08 NOTE — Assessment & Plan Note (Signed)
On crestor.  Low cholesterol diet and exercise.  Follow lipid panel and liver function tests.   

## 2020-12-08 NOTE — Assessment & Plan Note (Signed)
Blood pressure as outlined.  Persistent elevation.  Start losartan.  Check metabolic panel in 10-14 days.  Follow pressures.  Follow metabolic panel.

## 2020-12-08 NOTE — Assessment & Plan Note (Signed)
Off cymbalta.  On remeron.  Seeing psychiatry. Doing better.  Follow.

## 2020-12-08 NOTE — Assessment & Plan Note (Signed)
Off cymbalta.  On remeron. Doing well.  Follow.  Continue f/u with psychiatry.

## 2020-12-08 NOTE — Assessment & Plan Note (Signed)
Low carb diet and exercise.  Follow met b and a1c.  

## 2020-12-09 ENCOUNTER — Encounter: Payer: Self-pay | Admitting: Psychiatry

## 2020-12-09 ENCOUNTER — Telehealth (INDEPENDENT_AMBULATORY_CARE_PROVIDER_SITE_OTHER): Payer: Medicare PPO | Admitting: Psychiatry

## 2020-12-09 ENCOUNTER — Other Ambulatory Visit: Payer: Self-pay

## 2020-12-09 DIAGNOSIS — F3342 Major depressive disorder, recurrent, in full remission: Secondary | ICD-10-CM

## 2020-12-09 DIAGNOSIS — F411 Generalized anxiety disorder: Secondary | ICD-10-CM | POA: Diagnosis not present

## 2020-12-09 MED ORDER — MIRTAZAPINE 15 MG PO TABS
22.5000 mg | ORAL_TABLET | Freq: Every day | ORAL | 0 refills | Status: DC
Start: 2020-12-09 — End: 2021-02-23

## 2020-12-09 NOTE — Progress Notes (Signed)
Virtual Visit via Video Note  I connected with Angelica Robertson on 12/09/20 at  2:20 PM EST by a video enabled telemedicine application and verified that I am speaking with the correct person using two identifiers.  Location Provider Location : [facility] Patient Location : Home  Participants: Patient , Provider   I discussed the limitations of evaluation and management by telemedicine and the availability of in person appointments. The patient expressed understanding and agreed to proceed.   I discussed the assessment and treatment plan with the patient. The patient was provided an opportunity to ask questions and all were answered. The patient agreed with the plan and demonstrated an understanding of the instructions.   The patient was advised to call back or seek an in-person evaluation if the symptoms worsen or if the condition fails to improve as anticipated.   [place] MD OP Progress Note  12/09/2020 6:19 PM Angelica Robertson  MRN:  [id number]  Chief Complaint:  Chief Complaint    Follow-up     HPI: Angelica Robertson is a 74 year old Caucasian female, married, retired, lives in [place], has a history of MDD, GAD, hyperlipidemia, arthritis was evaluated by telemedicine today.  Patient today reports she was able to taper herself off of the Cymbalta as discussed last visit.  She tolerated the tapering process well.  Patient reports overall her mood symptoms are stable.  She denies any suicidality, homicidality or perceptual disturbances.  She however does report restless leg symptoms, she has been struggling with this since the past 3 to 4 years.  She reports this may be getting worse.  She was started on a new antihypertensive medication recently and she reports her blood pressure is more under control.  Patient denies any other concerns today.  Visit Diagnosis:    ICD-10-CM   1. MDD (major depressive disorder), recurrent, in full remission ([place])  F33.42 mirtazapine (REMERON) 15 MG tablet  2.  GAD (generalized anxiety disorder)  F41.1 mirtazapine (REMERON) 15 MG tablet    Past Psychiatric History: I have reviewed past psychiatric history from my progress note on 01/12/2020  Past Medical History:  Past Medical History:  Diagnosis Date  . Depression 2000  . Heart murmur   . Hypercholesterolemia Osteoporosis  . Hyperlipemia     Past Surgical History:  Procedure Laterality Date  . ABDOMINAL HYSTERECTOMY  1980  . COLONOSCOPY WITH PROPOFOL N/A 03/05/2018   Procedure: COLONOSCOPY WITH PROPOFOL;  Surgeon: Toledo, [name], MD;  Location: [facility] ENDOSCOPY;  Service: Endoscopy;  Laterality: N/A;    Family Psychiatric History: I have reviewed family psychiatric history from my progress note on 01/12/2020  Family History:  Family History  Problem Relation Age of Onset  . Heart disease Mother   . Diabetes Mother   . Heart disease Father   . Diabetes Father   . Colon cancer Other   . Congestive Heart Failure Brother   . Colon cancer Brother   . Mental illness Paternal Aunt   . BRCA 1/2 Neg Hx   . Breast cancer Neg Hx   . Cowden syndrome Neg Hx   . DES usage Neg Hx   . Endometrial cancer Neg Hx   . Li-Fraumeni syndrome Neg Hx   . Ovarian cancer Neg Hx     Social History: I have reviewed social history from my progress note on 01/12/2020 Social History   Socioeconomic History  . Marital status: Married    Spouse name: robert  . Number of children: 2  .  Years of education: Not on file  . Highest education level: Not on file  Occupational History  . Not on file  Tobacco Use  . Smoking status: Never Smoker  . Smokeless tobacco: Never Used  Vaping Use  . Vaping Use: Never used  Substance and Sexual Activity  . Alcohol use: Yes    Alcohol/week: 0.0 standard drinks    Comment: occasionally - socially during the summer a glass of wine or beer  . Drug use: No  . Sexual activity: Yes  Other Topics Concern  . Not on file  Social History Narrative  . Not on file   Social  Determinants of Health   Financial Resource Strain: Not on file  Food Insecurity: Not on file  Transportation Needs: Not on file  Physical Activity: Not on file  Stress: Not on file  Social Connections: Not on file    Allergies: No Known Allergies  Metabolic Disorder Labs: Lab Results  Component Value Date   HGBA1C 6.2 11/29/2020   No results found for: PROLACTIN Lab Results  Component Value Date   CHOL 163 11/29/2020   TRIG 197.0 (H) 11/29/2020   HDL 52.30 11/29/2020   CHOLHDL 3 11/29/2020   VLDL 39.4 11/29/2020   LDLCALC 71 11/29/2020   LDLCALC 103 (H) 06/16/2020   Lab Results  Component Value Date   TSH 3.32 11/29/2020   TSH 3.160 12/18/2019    Therapeutic Level Labs: No results found for: LITHIUM No results found for: VALPROATE No components found for:  CBMZ  Current Medications: Current Outpatient Medications  Medication Sig Dispense Refill  . mirtazapine (REMERON) 15 MG tablet Take 1.5 tablets (22.5 mg total) by mouth at bedtime. 135 tablet 0  . aspirin EC 81 MG tablet Take 1 tablet (81 mg total) by mouth daily. 30 tablet 0  . calcium carbonate (OS-CAL) 600 MG TABS tablet Take 600 mg by mouth 2 (two) times daily with a meal.    . Glucosamine-Chondroitin 250-200 MG CAPS Take 1 tablet by mouth daily. TAKES 25 MCG    . rosuvastatin (CRESTOR) 20 MG tablet Take 1 tablet (20 mg total) by mouth daily. 90 tablet 1   No current facility-administered medications for this visit.     Musculoskeletal: Strength & Muscle Tone: UTA Gait & Station: UTA Patient leans: N/A  Psychiatric Specialty Exam: Review of Systems  Psychiatric/Behavioral: Positive for sleep disturbance.  All other systems reviewed and are negative.   Last menstrual period 11/27/1979.There is no height or weight on file to calculate BMI.  General Appearance: Casual  Eye Contact:  Fair  Speech:  Clear and Coherent  Volume:  Normal  Mood:  Euthymic  Affect:  Congruent  Thought Process:  Goal  Directed and Descriptions of Associations: Intact  Orientation:  Full (Time, Place, and Person)  Thought Content: Logical   Suicidal Thoughts:  No  Homicidal Thoughts:  No  Memory:  Immediate;   Fair Recent;   Fair Remote;   Fair  Judgement:  Fair  Insight:  Fair  Psychomotor Activity:  Normal  Concentration:  Concentration: Fair and Attention Span: Fair  Recall:  [employer] of Knowledge: Fair  Language: Fair  Akathisia:  No  Handed:  Right  AIMS (if indicated): UTA  Assets:  Communication Skills Desire for Improvement Housing Intimacy Social Support Talents/Skills Transportation  ADL's:  Intact  Cognition: WNL  Sleep:  Restless on and off due to restless leg symptoms   Screenings: AUDIT   Flowsheet Row  Admission (Discharged) from 12/17/2019 in [place] Admission (Discharged) from OP Visit from 12/09/2012 in [facility] [facility]  Alcohol Use Disorder Identification Test Final Score (AUDIT) 0 0    GAD-7   Flowsheet Row Counselor from 01/14/2020 in [place]  Total GAD-7 Score 5    Mini-Mental   [place] from 02/27/2018 in [place] from 02/02/2017 in [place] from 02/03/2016 in [facility]  Total Score (max 30 points ) 30 30 30     PHQ2-9   [place] Visit from 12/01/2020 in [facility] Video Visit from 10/04/2020 in [place] Video Visit from 04/20/2020 in [place] Counselor from 12/25/2019 in [place] Office Visit from 10/21/2019 in [place]  PHQ-2 Total Score 0 0 0 3 6  PHQ-9 Total Score 0 -- -- 11 17       Assessment and Plan: Angelica Robertson is a 74 year old Caucasian female, married, retired, lives in [place], has a  history of MDD, anxiety, hyperlipidemia, arthritis was evaluated by telemedicine today.  Patient is biologically predisposed given her history of trauma.  Patient with psychosocial stressors of the pandemic, continues to be stable with regards to her mood.  Patient however does report restless leg symptoms and will benefit from the following plan.   Plan MDD in remission Discontinue Cymbalta. Reduce Remeron to 22.5 mg p.o. nightly.  Dosage reduced due to restless leg symptoms.  GAD-stable Remeron 22.5 mg p.o. nightly  Follow-up in clinic in 2 months or sooner if needed.  I have spent atleast 20 minutes face to face by video with patient today. More than 50 % of the time was spent for preparing to see the patient ( e.g., review of test, records ), ordering medications and test ,psychoeducation and supportive psychotherapy and care coordination,as well as documenting clinical information in electronic health record. This note was generated in part or whole with voice recognition software. Voice recognition is usually quite accurate but there are transcription errors that can and very often do occur. I apologize for any typographical errors that were not detected and corrected.       Ursula Alert, MD 12/09/2020, 6:19 PM

## 2020-12-17 ENCOUNTER — Other Ambulatory Visit: Payer: Self-pay

## 2020-12-17 ENCOUNTER — Ambulatory Visit (INDEPENDENT_AMBULATORY_CARE_PROVIDER_SITE_OTHER): Payer: Medicare PPO | Admitting: *Deleted

## 2020-12-17 DIAGNOSIS — I10 Essential (primary) hypertension: Secondary | ICD-10-CM

## 2020-12-17 DIAGNOSIS — E538 Deficiency of other specified B group vitamins: Secondary | ICD-10-CM | POA: Diagnosis not present

## 2020-12-17 LAB — BASIC METABOLIC PANEL
BUN: 13 mg/dL (ref 6–23)
CO2: 31 mEq/L (ref 19–32)
Calcium: 10 mg/dL (ref 8.4–10.5)
Chloride: 102 mEq/L (ref 96–112)
Creatinine, Ser: 0.86 mg/dL (ref 0.40–1.20)
GFR: 67.52 mL/min (ref >60.00)
Glucose, Bld: 83 mg/dL (ref 70–99)
Potassium: 4.3 mEq/L (ref 3.5–5.1)
Sodium: 137 mEq/L (ref 135–145)

## 2020-12-17 MED ORDER — CYANOCOBALAMIN 1000 MCG/ML IJ SOLN
1000.0000 ug | Freq: Once | INTRAMUSCULAR | Status: AC
  Administered 2020-12-17: 1000 ug via INTRAMUSCULAR

## 2020-12-17 NOTE — Progress Notes (Addendum)
Pt received B12 in right deltoid. Tolerated it well.  Signed.  Dale Langley

## 2020-12-20 ENCOUNTER — Other Ambulatory Visit: Payer: Self-pay

## 2020-12-20 MED ORDER — LOSARTAN POTASSIUM 25 MG PO TABS: 25.0000 mg | ORAL_TABLET | Freq: Every day | ORAL | 2 refills | Status: DC

## 2020-12-23 ENCOUNTER — Other Ambulatory Visit: Payer: Self-pay | Admitting: Internal Medicine

## 2021-01-18 ENCOUNTER — Other Ambulatory Visit: Payer: Self-pay

## 2021-01-18 ENCOUNTER — Ambulatory Visit (INDEPENDENT_AMBULATORY_CARE_PROVIDER_SITE_OTHER): Payer: Medicare PPO

## 2021-01-18 DIAGNOSIS — E538 Deficiency of other specified B group vitamins: Secondary | ICD-10-CM | POA: Diagnosis not present

## 2021-01-18 MED ORDER — CYANOCOBALAMIN 1000 MCG/ML IJ SOLN
1000.0000 ug | Freq: Once | INTRAMUSCULAR | Status: AC
  Administered 2021-01-18: 1000 ug via INTRAMUSCULAR

## 2021-01-18 NOTE — Progress Notes (Signed)
Patient presented for B 12 injection to left deltoid, patient voiced no concerns nor showed any signs of distress during injection. 

## 2021-01-20 ENCOUNTER — Encounter: Payer: Self-pay | Admitting: Internal Medicine

## 2021-01-22 MED ORDER — LOSARTAN POTASSIUM 50 MG PO TABS: 50.0000 mg | ORAL_TABLET | Freq: Every day | ORAL | 3 refills | Status: DC

## 2021-01-22 NOTE — Telephone Encounter (Signed)
rx sent in for losartan 50mg  #30 with 3 refills.

## 2021-02-04 ENCOUNTER — Ambulatory Visit: Payer: Medicare PPO | Admitting: Internal Medicine

## 2021-02-11 ENCOUNTER — Encounter: Payer: Self-pay | Admitting: Internal Medicine

## 2021-02-11 ENCOUNTER — Ambulatory Visit: Payer: Medicare PPO | Admitting: Internal Medicine

## 2021-02-11 ENCOUNTER — Other Ambulatory Visit: Payer: Self-pay

## 2021-02-11 DIAGNOSIS — R739 Hyperglycemia, unspecified: Secondary | ICD-10-CM

## 2021-02-11 DIAGNOSIS — I10 Essential (primary) hypertension: Secondary | ICD-10-CM | POA: Diagnosis not present

## 2021-02-11 DIAGNOSIS — M542 Cervicalgia: Secondary | ICD-10-CM

## 2021-02-11 DIAGNOSIS — E7800 Pure hypercholesterolemia, unspecified: Secondary | ICD-10-CM | POA: Diagnosis not present

## 2021-02-11 DIAGNOSIS — F332 Major depressive disorder, recurrent severe without psychotic features: Secondary | ICD-10-CM

## 2021-02-11 NOTE — Progress Notes (Signed)
Patient ID: Angelica Robertson, female   DOB: [birth year], 75 y.o.   MRN: [id number]   Subjective:    Patient ID: Angelica Robertson, female    DOB: December 26, 1945, 75 y.o.   MRN: [id number]  HPI This visit occurred during the SARS-CoV-2 public health emergency.  Safety protocols were in place, including screening questions prior to the visit, additional usage of staff PPE, and extensive cleaning of exam room while observing appropriate contact time as indicated for disinfecting solutions.  Patient here for a scheduled follow up.  Here to follow up regarding her blood pressure.  She was started on losartan and recently the dose was increased to 77m q day.  Blood pressures doing better.  Feels better.  Watching her diet.  Losing weight.  Started a program through her church.  No chest pain or sob reported.  No abdominal pain or bowel change reported.     Past Medical History:  Diagnosis Date  . Depression 2000  . Heart murmur   . Hypercholesterolemia Osteoporosis  . Hyperlipemia    Past Surgical History:  Procedure Laterality Date  . ABDOMINAL HYSTERECTOMY  1980  . COLONOSCOPY WITH PROPOFOL N/A 03/05/2018   Procedure: COLONOSCOPY WITH PROPOFOL;  Surgeon: Toledo, TBenay Pike MD;  Location: [facility] ENDOSCOPY;  Service: Endoscopy;  Laterality: N/A;   Family History  Problem Relation Age of Onset  . Heart disease Mother   . Diabetes Mother   . Heart disease Father   . Diabetes Father   . Colon cancer Other   . Congestive Heart Failure Brother   . Colon cancer Brother   . Mental illness Paternal Aunt   . BRCA 1/2 Neg Hx   . Breast cancer Neg Hx   . Cowden syndrome Neg Hx   . DES usage Neg Hx   . Endometrial cancer Neg Hx   . Li-Fraumeni syndrome Neg Hx   . Ovarian cancer Neg Hx    Social History   Socioeconomic History  . Marital status: Married    Spouse name: robert  . Number of children: 2  . Years of education: Not on file  . Highest education level: Not on file  Occupational History  . Not  on file  Tobacco Use  . Smoking status: Never Smoker  . Smokeless tobacco: Never Used  Vaping Use  . Vaping Use: Never used  Substance and Sexual Activity  . Alcohol use: Yes    Alcohol/week: 0.0 standard drinks    Comment: occasionally - socially during the summer a glass of wine or beer  . Drug use: No  . Sexual activity: Yes  Other Topics Concern  . Not on file  Social History Narrative  . Not on file   Social Determinants of Health   Financial Resource Strain: Not on file  Food Insecurity: Not on file  Transportation Needs: Not on file  Physical Activity: Not on file  Stress: Not on file  Social Connections: Not on file    Outpatient Encounter Medications as of 02/11/2021  Medication Sig  . aspirin EC 81 MG tablet Take 1 tablet (81 mg total) by mouth daily.  . calcium carbonate (OS-CAL) 600 MG TABS tablet Take 600 mg by mouth 2 (two) times daily with a meal.  . Glucosamine-Chondroitin 250-200 MG CAPS Take 1 tablet by mouth daily. TAKES 25 MCG  . losartan (COZAAR) 50 MG tablet Take 1 tablet (50 mg total) by mouth daily.  . mirtazapine (REMERON) 15 MG tablet Take 1.5 tablets (  22.5 mg total) by mouth at bedtime.  . rosuvastatin (CRESTOR) 20 MG tablet TAKE 1 TABLET BY MOUTH AT BEDTIME   No facility-administered encounter medications on file as of 02/11/2021.    Review of Systems  Constitutional: Negative for appetite change and unexpected weight change.  HENT: Negative for congestion.   Respiratory: Negative for cough, chest tightness and shortness of breath.   Cardiovascular: Negative for chest pain, palpitations and leg swelling.  Gastrointestinal: Negative for abdominal pain, diarrhea, nausea and vomiting.  Genitourinary: Negative for difficulty urinating and dysuria.  Musculoskeletal: Negative for joint swelling and myalgias.  Skin: Negative for color change and rash.  Neurological: Negative for dizziness, light-headedness and headaches.  Psychiatric/Behavioral:  Negative for agitation and dysphoric mood.       Objective:    Physical Exam Vitals reviewed.  Constitutional:      General: She is not in acute distress.    Appearance: Normal appearance.  HENT:     Head: Normocephalic and atraumatic.     Right Ear: External ear normal.     Left Ear: External ear normal.     Mouth/Throat:     Mouth: Oropharynx is clear and moist.  Eyes:     General: No scleral icterus.       Right eye: No discharge.        Left eye: No discharge.     Conjunctiva/sclera: Conjunctivae normal.  Neck:     Thyroid: No thyromegaly.  Cardiovascular:     Rate and Rhythm: Normal rate and regular rhythm.  Pulmonary:     Effort: No respiratory distress.     Breath sounds: Normal breath sounds. No wheezing.  Abdominal:     General: Bowel sounds are normal.     Palpations: Abdomen is soft.     Tenderness: There is no abdominal tenderness.  Musculoskeletal:        General: No swelling, tenderness or edema.     Cervical back: Neck supple. No tenderness.  Lymphadenopathy:     Cervical: No cervical adenopathy.  Skin:    Findings: No erythema or rash.  Neurological:     Mental Status: She is alert.  Psychiatric:        Mood and Affect: Mood normal.        Behavior: Behavior normal.     BP 128/76   Pulse 76   Temp 97.7 F (36.5 C) (Oral)   Resp 16   Ht _0  (1.6 m)   Wt 141 lb (64 kg)   LMP 11/27/1979   SpO2 98%   BMI 24.98 kg/m  Wt Readings from Last 3 Encounters:  02/11/21 141 lb (64 kg)  12/01/20 146 lb (66.2 kg)  09/01/20 146 lb 6.4 oz (66.4 kg)     Lab Results  Component Value Date   WBC 5.6 11/29/2020   HGB 13.6 11/29/2020   HCT 39.8 11/29/2020   PLT 270.0 11/29/2020   GLUCOSE 83 12/17/2020   CHOL 163 11/29/2020   TRIG 197.0 (H) 11/29/2020   HDL 52.30 11/29/2020   LDLCALC 71 11/29/2020   ALT 25 11/29/2020   AST 21 11/29/2020   NA 137 12/17/2020   K 4.3 12/17/2020   CL 102 12/17/2020   CREATININE 0.86 12/17/2020   BUN 13  12/17/2020   CO2 31 12/17/2020   TSH 3.32 11/29/2020   HGBA1C 6.2 11/29/2020    MM 3D SCREEN BREAST BILATERAL  Result Date: 06/15/2020 CLINICAL DATA:  Screening. EXAM: DIGITAL SCREENING BILATERAL MAMMOGRAM WITH  TOMO AND CAD COMPARISON:  Previous exam(s). ACR Breast Density Category b: There are scattered areas of fibroglandular density. FINDINGS: There are no findings suspicious for malignancy. Images were processed with CAD. IMPRESSION: No mammographic evidence of malignancy. A result letter of this screening mammogram will be mailed directly to the patient. RECOMMENDATION: Screening mammogram in one year. (Code:SM-B-01Y) BI-RADS CATEGORY  1: Negative. Electronically Signed   By: Audie Pinto M.D.   On: 06/15/2020 13:44       Assessment & Plan:   Problem List Items Addressed This Visit    Hypercholesterolemia    On crestor.  Low cholesterol diet and exercise.  Follow lipid panel and liver function tests.        Relevant Orders   Hepatic function panel   Lipid panel   Hyperglycemia    Has adjusted her diet.  Lost weight.  Follow met b and a1c.       Relevant Orders   Hemoglobin A1c   Hypertension    Blood pressures doing better.  Continue losartan. Follow pressures.  Follow metabolic panel.        Relevant Orders   Basic metabolic panel   MDD (major depressive disorder), recurrent severe, without psychosis ([place])    Doing much better.  Followed by psychiatry.  Continues on remeron.  Follow.        Neck pain    Previously saw [facility].  Saw Dr Sharlet Salina. Doing better.  Follow.           Einar Pheasant, MD

## 2021-02-13 ENCOUNTER — Encounter: Payer: Self-pay | Admitting: Internal Medicine

## 2021-02-13 NOTE — Assessment & Plan Note (Signed)
On crestor.  Low cholesterol diet and exercise.  Follow lipid panel and liver function tests.   

## 2021-02-13 NOTE — Assessment & Plan Note (Signed)
Blood pressures doing better.  Continue losartan. Follow pressures.  Follow metabolic panel.

## 2021-02-13 NOTE — Assessment & Plan Note (Signed)
Doing much better.  Followed by psychiatry.  Continues on remeron.  Follow.   °

## 2021-02-13 NOTE — Assessment & Plan Note (Signed)
Previously saw [facility].  Saw Dr Yves Dill. Doing better.  Follow.

## 2021-02-13 NOTE — Assessment & Plan Note (Signed)
Has adjusted her diet.  Lost weight.  Follow met b and a1c.  

## 2021-02-18 ENCOUNTER — Ambulatory Visit (INDEPENDENT_AMBULATORY_CARE_PROVIDER_SITE_OTHER): Payer: Medicare PPO | Admitting: *Deleted

## 2021-02-18 ENCOUNTER — Other Ambulatory Visit: Payer: Self-pay

## 2021-02-18 DIAGNOSIS — E538 Deficiency of other specified B group vitamins: Secondary | ICD-10-CM

## 2021-02-18 MED ORDER — CYANOCOBALAMIN 1000 MCG/ML IJ SOLN
1000.0000 ug | Freq: Once | INTRAMUSCULAR | Status: AC
  Administered 2021-02-18: 1000 ug via INTRAMUSCULAR

## 2021-02-18 NOTE — Progress Notes (Signed)
Patient presented for B 12 injection to right deltoid, patient voiced no concerns nor showed any signs of distress during injection. 

## 2021-02-19 ENCOUNTER — Other Ambulatory Visit: Payer: Self-pay | Admitting: Internal Medicine

## 2021-02-23 ENCOUNTER — Other Ambulatory Visit: Payer: Self-pay

## 2021-02-23 ENCOUNTER — Encounter: Payer: Self-pay | Admitting: Psychiatry

## 2021-02-23 ENCOUNTER — Telehealth (INDEPENDENT_AMBULATORY_CARE_PROVIDER_SITE_OTHER): Payer: Medicare PPO | Admitting: Psychiatry

## 2021-02-23 DIAGNOSIS — F411 Generalized anxiety disorder: Secondary | ICD-10-CM

## 2021-02-23 DIAGNOSIS — F3342 Major depressive disorder, recurrent, in full remission: Secondary | ICD-10-CM | POA: Diagnosis not present

## 2021-02-23 MED ORDER — MIRTAZAPINE 15 MG PO TABS
22.5000 mg | ORAL_TABLET | Freq: Every day | ORAL | 0 refills | Status: DC
Start: 2021-02-23 — End: 2021-05-17

## 2021-02-23 NOTE — Progress Notes (Signed)
Virtual Visit via Video Note  I connected with Angelica Robertson on 02/23/21 at  2:00 PM EDT by a video enabled telemedicine application and verified that I am speaking with the correct person using two identifiers.  Location Provider Location : [facility] Patient Location : Home  Participants: Patient , Provider   I discussed the limitations of evaluation and management by telemedicine and the availability of in person appointments. The patient expressed understanding and agreed to proceed.   I discussed the assessment and treatment plan with the patient. The patient was provided an opportunity to ask questions and all were answered. The patient agreed with the plan and demonstrated an understanding of the instructions.   The patient was advised to call back or seek an in-person evaluation if the symptoms worsen or if the condition fails to improve as anticipated.   [place] MD OP Progress Note  02/23/2021 2:22 PM Angelica Robertson  MRN:  [id number]  Chief Complaint:  Chief Complaint    Follow-up; Depression; Insomnia     HPI: Angelica Robertson is a 75 year old Caucasian female, married, retired, lives in [place], has a history of MDD, GAD, hyperlipidemia, arthritis was evaluated by telemedicine today.  Patient today reports she is currently making progress.  She reports she is doing well on the lower dosage of Remeron.  Her restless leg symptoms at night has improved.  She is sleeping better.  She reports appetite is fair.  She and her husband are currently in a weight loss plan at her church.  She has lost 6 pounds since the past 3 weeks.  She is on a low-carb low sugar diet.  Patient denies any suicidality, homicidality or perceptual disturbances.  Patient denies any other concerns today.  Visit Diagnosis:    ICD-10-CM   1. MDD (major depressive disorder), recurrent, in full remission ([place])  F33.42 mirtazapine (REMERON) 15 MG tablet  2. GAD (generalized anxiety disorder)  F41.1 mirtazapine (REMERON)  15 MG tablet    Past Psychiatric History: I have reviewed past psychiatric history from my progress note on 01/12/2020  Past Medical History:  Past Medical History:  Diagnosis Date  . Depression 2000  . Heart murmur   . Hypercholesterolemia Osteoporosis  . Hyperlipemia     Past Surgical History:  Procedure Laterality Date  . ABDOMINAL HYSTERECTOMY  1980  . COLONOSCOPY WITH PROPOFOL N/A 03/05/2018   Procedure: COLONOSCOPY WITH PROPOFOL;  Surgeon: Toledo, [name], MD;  Location: [facility] ENDOSCOPY;  Service: Endoscopy;  Laterality: N/A;    Family Psychiatric History: I have reviewed family psychiatric history from my progress note on 01/12/2020  Family History:  Family History  Problem Relation Age of Onset  . Heart disease Mother   . Diabetes Mother   . Heart disease Father   . Diabetes Father   . Colon cancer Other   . Congestive Heart Failure Brother   . Colon cancer Brother   . Mental illness Paternal Aunt   . BRCA 1/2 Neg Hx   . Breast cancer Neg Hx   . Cowden syndrome Neg Hx   . DES usage Neg Hx   . Endometrial cancer Neg Hx   . Li-Fraumeni syndrome Neg Hx   . Ovarian cancer Neg Hx     Social History: I have reviewed social history from my progress note on 01/12/2020 Social History   Socioeconomic History  . Marital status: Married    Spouse name: robert  . Number of children: 2  . Years of  education: Not on file  . Highest education level: Not on file  Occupational History  . Not on file  Tobacco Use  . Smoking status: Never Smoker  . Smokeless tobacco: Never Used  Vaping Use  . Vaping Use: Never used  Substance and Sexual Activity  . Alcohol use: Yes    Alcohol/week: 0.0 standard drinks    Comment: occasionally - socially during the summer a glass of wine or beer  . Drug use: No  . Sexual activity: Yes  Other Topics Concern  . Not on file  Social History Narrative  . Not on file   Social Determinants of Health   Financial Resource Strain: Not on  file  Food Insecurity: Not on file  Transportation Needs: Not on file  Physical Activity: Not on file  Stress: Not on file  Social Connections: Not on file    Allergies: No Known Allergies  Metabolic Disorder Labs: Lab Results  Component Value Date   HGBA1C 6.2 11/29/2020   No results found for: PROLACTIN Lab Results  Component Value Date   CHOL 163 11/29/2020   TRIG 197.0 (H) 11/29/2020   HDL 52.30 11/29/2020   CHOLHDL 3 11/29/2020   VLDL 39.4 11/29/2020   LDLCALC 71 11/29/2020   LDLCALC 103 (H) 06/16/2020   Lab Results  Component Value Date   TSH 3.32 11/29/2020   TSH 3.160 12/18/2019    Therapeutic Level Labs: No results found for: LITHIUM No results found for: VALPROATE No components found for:  CBMZ  Current Medications: Current Outpatient Medications  Medication Sig Dispense Refill  . calcium carbonate (OS-CAL) 600 MG TABS tablet Take 600 mg by mouth 2 (two) times daily with a meal.    . Glucosamine-Chondroitin 250-200 MG CAPS Take 1 tablet by mouth daily. TAKES 25 MCG    . losartan (COZAAR) 50 MG tablet Take 1 tablet (50 mg total) by mouth daily. 30 tablet 3  . rosuvastatin (CRESTOR) 20 MG tablet TAKE 1 TABLET BY MOUTH AT BEDTIME 90 tablet 1  . mirtazapine (REMERON) 15 MG tablet Take 1.5 tablets (22.5 mg total) by mouth at bedtime. 135 tablet 0   No current facility-administered medications for this visit.     Musculoskeletal: Strength & Muscle Tone: UTA Gait & Station: normal Patient leans: N/A  Psychiatric Specialty Exam: Review of Systems  Psychiatric/Behavioral: Negative for agitation, behavioral problems, confusion, decreased concentration, dysphoric mood, hallucinations, self-injury, sleep disturbance and suicidal ideas. The patient is not nervous/anxious and is not hyperactive.   All other systems reviewed and are negative.   Last menstrual period 11/27/1979.There is no height or weight on file to calculate BMI.  General Appearance: Casual   Eye Contact:  Fair  Speech:  Clear and Coherent  Volume:  Normal  Mood:  Euthymic  Affect:  Congruent  Thought Process:  Goal Directed and Descriptions of Associations: Intact  Orientation:  Full (Time, Place, and Person)  Thought Content: Logical   Suicidal Thoughts:  No  Homicidal Thoughts:  No  Memory:  Immediate;   Fair Recent;   Fair Remote;   Fair  Judgement:  Fair  Insight:  Fair  Psychomotor Activity:  Normal  Concentration:  Concentration: Fair and Attention Span: Fair  Recall:  [employer] of Knowledge: Fair  Language: Fair  Akathisia:  No  Handed:  Right  AIMS (if indicated): UTA  Assets:  Communication Skills Desire for Improvement Housing Social Support  ADL's:  Intact  Cognition: WNL  Sleep:  Fair  Screenings: AUDIT   Flowsheet Row Admission (Discharged) from 12/17/2019 in [facility] Admission (Discharged) from OP Visit from 12/09/2012 in [facility]  Alcohol Use Disorder Identification Test Final Score (AUDIT) 0 0    GAD-7   Flowsheet Row Counselor from 01/14/2020 in [facility]  Total GAD-7 Score 5    Mini-Mental   Flowsheet Row Clinical Support from 02/27/2018 in [facility] from 02/02/2017 in [facility] from 02/03/2016 in [facility]  Total Score (max 30 points ) _0 PHQ2-9   Flowsheet Row Video Visit from 02/23/2021 in [facility] Office Visit from 12/01/2020 in [facility] Video Visit from 10/04/2020 in [facility] Video Visit from 04/20/2020 in [facility] Counselor from 12/25/2019 in [facility]  PHQ-2 Total Score 0 0 0 0 3  PHQ-9 Total Score -- 0 -- -- 11    Flowsheet Row Video Visit from 02/23/2021 in [facility] Admission (Discharged) from 12/17/2019 in [facility] No Risk Low Risk       Assessment and Plan: Angelica Robertson is a 75 year old Caucasian female, married, retired, lives in [place], has a history of MDD, anxiety, hyperlipidemia, arthritis was evaluated by telemedicine today.  Patient is biologically predisposed given her history of trauma.  Patient with psychosocial stressors of the pandemic, currently is making progress.  Plan as noted below.  Plan MDD in remission PHQ 2 today equals 0 Continue mirtazapine 22.5 mg p.o. nightly.   GAD-stable Mirtazapine 22.5 mg p.o. nightly  Follow-up in clinic 2 to 3 months in person.  This note was generated in part or whole with voice recognition software. Voice recognition is usually quite accurate but there are transcription errors that can and very often do occur. I apologize for any typographical errors that were not detected and corrected.       Ursula Alert, MD 02/24/2021, 10:02 AM

## 2021-03-22 ENCOUNTER — Ambulatory Visit (INDEPENDENT_AMBULATORY_CARE_PROVIDER_SITE_OTHER): Payer: Medicare PPO

## 2021-03-22 ENCOUNTER — Other Ambulatory Visit: Payer: Self-pay

## 2021-03-22 DIAGNOSIS — E538 Deficiency of other specified B group vitamins: Secondary | ICD-10-CM

## 2021-03-22 MED ORDER — CYANOCOBALAMIN 1000 MCG/ML IJ SOLN
1000.0000 ug | Freq: Once | INTRAMUSCULAR | Status: AC
  Administered 2021-03-22: 1000 ug via INTRAMUSCULAR

## 2021-03-22 NOTE — Progress Notes (Signed)
Patient presented for B 12 injection to left deltoid, patient voiced no concerns nor showed any signs of distress during injection. 

## 2021-04-21 ENCOUNTER — Ambulatory Visit (INDEPENDENT_AMBULATORY_CARE_PROVIDER_SITE_OTHER): Payer: Medicare PPO

## 2021-04-21 ENCOUNTER — Other Ambulatory Visit: Payer: Self-pay

## 2021-04-21 DIAGNOSIS — E538 Deficiency of other specified B group vitamins: Secondary | ICD-10-CM

## 2021-04-21 MED ORDER — CYANOCOBALAMIN 1000 MCG/ML IJ SOLN
1000.0000 ug | Freq: Once | INTRAMUSCULAR | Status: AC
Start: 2021-04-21 — End: 2021-04-21
  Administered 2021-04-21: 1000 ug via INTRAMUSCULAR

## 2021-04-21 NOTE — Progress Notes (Signed)
Patient came in today for B-12 injection in left deltoid IM. Patient tolerated well with no signs of distress.  

## 2021-05-10 ENCOUNTER — Other Ambulatory Visit: Payer: Self-pay | Admitting: Internal Medicine

## 2021-05-10 DIAGNOSIS — Z1231 Encounter for screening mammogram for malignant neoplasm of breast: Secondary | ICD-10-CM

## 2021-05-17 ENCOUNTER — Encounter: Payer: Self-pay | Admitting: Psychiatry

## 2021-05-17 ENCOUNTER — Telehealth (INDEPENDENT_AMBULATORY_CARE_PROVIDER_SITE_OTHER): Payer: Medicare PPO | Admitting: Psychiatry

## 2021-05-17 ENCOUNTER — Other Ambulatory Visit: Payer: Self-pay

## 2021-05-17 DIAGNOSIS — F3342 Major depressive disorder, recurrent, in full remission: Secondary | ICD-10-CM | POA: Diagnosis not present

## 2021-05-17 DIAGNOSIS — F411 Generalized anxiety disorder: Secondary | ICD-10-CM | POA: Diagnosis not present

## 2021-05-17 MED ORDER — MIRTAZAPINE 15 MG PO TABS: 22.5000 mg | ORAL_TABLET | Freq: Every day | ORAL | 1 refills | Status: DC

## 2021-05-17 NOTE — Progress Notes (Signed)
Virtual Visit via Video Note  I connected with Angelica Robertson on 05/17/21 at  1:30 PM EDT by a video enabled telemedicine application and verified that I am speaking with the correct person using two identifiers.  Location Provider Location : Office Patient Location : Home  Participants: Patient , Provider    I discussed the limitations of evaluation and management by telemedicine and the availability of in person appointments. The patient expressed understanding and agreed to proceed.    I discussed the assessment and treatment plan with the patient. The patient was provided an opportunity to ask questions and all were answered. The patient agreed with the plan and demonstrated an understanding of the instructions.   The patient was advised to call back or seek an in-person evaluation if the symptoms worsen or if the condition fails to improve as anticipated.   [facility] MD OP Progress Note  05/17/2021 1:57 PM SEVANNAH MADIA  MRN:  [id number]  Chief Complaint:  Chief Complaint    Follow-up; Depression; Insomnia     HPI: Angelica Robertson is a 75 year old Caucasian female, married, retired, lives in [place], has a history of MDD, GAD, hyperlipidemia, arthritis was evaluated by telemedicine today.  Patient today reports she is currently doing well.  Denies any significant sadness, anxiety or crying spells.  She reports appetite is fair.  She denies any concentration problems.  She continues to sleep good.  She reports she is compliant on the mirtazapine.  Denies side effects.  She reports she is part of church group and they meet every other Friday for a potluck, Bible study as well as games afterwards.  That has been helpful since she gets to meet others and make friends.  She also reports she has been spending a lot of time in her yard.  She was able to spend some time with family in [place] recently.  Patient denies any concerns.  She reports she wants to stay on the mirtazapine at this  dosage for now and is not interested in tapering off the mirtazapine yet.  Visit Diagnosis:    ICD-10-CM   1. MDD (major depressive disorder), recurrent, in full remission ([place])  F33.42 mirtazapine (REMERON) 15 MG tablet  2. GAD (generalized anxiety disorder)  F41.1 mirtazapine (REMERON) 15 MG tablet    Past Psychiatric History: I have reviewed past psychiatric history from progress note on 01/12/2020  Past Medical History:  Past Medical History:  Diagnosis Date  . Depression 2000  . Heart murmur   . Hypercholesterolemia Osteoporosis  . Hyperlipemia     Past Surgical History:  Procedure Laterality Date  . ABDOMINAL HYSTERECTOMY  1980  . COLONOSCOPY WITH PROPOFOL N/A 03/05/2018   Procedure: COLONOSCOPY WITH PROPOFOL;  Surgeon: Toledo, [name], MD;  Location: [facility] ENDOSCOPY;  Service: Endoscopy;  Laterality: N/A;    Family Psychiatric History: I have reviewed family psychiatric history from progress note on 01/12/2020  Family History:  Family History  Problem Relation Age of Onset  . Heart disease Mother   . Diabetes Mother   . Heart disease Father   . Diabetes Father   . Colon cancer Other   . Congestive Heart Failure Brother   . Colon cancer Brother   . Mental illness Paternal Aunt   . BRCA 1/2 Neg Hx   . Breast cancer Neg Hx   . Cowden syndrome Neg Hx   . DES usage Neg Hx   . Endometrial cancer Neg Hx   . Li-Fraumeni syndrome  Neg Hx   . Ovarian cancer Neg Hx     Social History: Reviewed social history from progress note on 01/12/2020 Social History   Socioeconomic History  . Marital status: Married    Spouse name: robert  . Number of children: 2  . Years of education: Not on file  . Highest education level: Not on file  Occupational History  . Not on file  Tobacco Use  . Smoking status: Never Smoker  . Smokeless tobacco: Never Used  Vaping Use  . Vaping Use: Never used  Substance and Sexual Activity  . Alcohol use: Yes    Alcohol/week: 0.0 standard drinks     Comment: occasionally - socially during the summer a glass of wine or beer  . Drug use: No  . Sexual activity: Yes  Other Topics Concern  . Not on file  Social History Narrative  . Not on file   Social Determinants of Health   Financial Resource Strain: Not on file  Food Insecurity: Not on file  Transportation Needs: Not on file  Physical Activity: Not on file  Stress: Not on file  Social Connections: Not on file    Allergies: No Known Allergies  Metabolic Disorder Labs: Lab Results  Component Value Date   HGBA1C 6.2 11/29/2020   No results found for: PROLACTIN Lab Results  Component Value Date   CHOL 163 11/29/2020   TRIG 197.0 (H) 11/29/2020   HDL 52.30 11/29/2020   CHOLHDL 3 11/29/2020   VLDL 39.4 11/29/2020   LDLCALC 71 11/29/2020   LDLCALC 103 (H) 06/16/2020   Lab Results  Component Value Date   TSH 3.32 11/29/2020   TSH 3.160 12/18/2019    Therapeutic Level Labs: No results found for: LITHIUM No results found for: VALPROATE No components found for:  CBMZ  Current Medications: Current Outpatient Medications  Medication Sig Dispense Refill  . calcium carbonate (OS-CAL) 600 MG TABS tablet Take 600 mg by mouth 2 (two) times daily with a meal.    . Glucosamine-Chondroitin 250-200 MG CAPS Take 1 tablet by mouth daily. TAKES 25 MCG    . losartan (COZAAR) 50 MG tablet Take 1 tablet (50 mg total) by mouth daily. 30 tablet 3  . mirtazapine (REMERON) 15 MG tablet Take 1.5 tablets (22.5 mg total) by mouth at bedtime. 135 tablet 1  . rosuvastatin (CRESTOR) 20 MG tablet TAKE 1 TABLET BY MOUTH AT BEDTIME 90 tablet 1   No current facility-administered medications for this visit.     Musculoskeletal: Strength & Muscle Tone: UTA Gait & Station: UTA Patient leans: N/A  Psychiatric Specialty Exam: Review of Systems  Psychiatric/Behavioral: Negative for agitation, behavioral problems, confusion, decreased concentration, dysphoric mood, hallucinations,  self-injury, sleep disturbance and suicidal ideas. The patient is not nervous/anxious and is not hyperactive.   All other systems reviewed and are negative.   Last menstrual period 11/27/1979.There is no height or weight on file to calculate BMI.  General Appearance: Casual  Eye Contact:  Fair  Speech:  Normal Rate  Volume:  Normal  Mood:  Euthymic  Affect:  Congruent  Thought Process:  Goal Directed and Descriptions of Associations: Intact  Orientation:  Full (Time, Place, and Person)  Thought Content: Logical   Suicidal Thoughts:  No  Homicidal Thoughts:  No  Memory:  Immediate;   Fair Recent;   Fair Remote;   Fair  Judgement:  Fair  Insight:  Fair  Psychomotor Activity:  Normal  Concentration:  Concentration: Good and Attention  Span: Good  Recall:  [employer] of Knowledge: Fair  Language: Fair  Akathisia:  No  Handed:  Right  AIMS (if indicated): UTA  Assets:  Communication Skills Desire for Improvement Housing Social Support  ADL's:  Intact  Cognition: WNL  Sleep:  Fair   Screenings: AUDIT   Flowsheet Row Admission (Discharged) from 12/17/2019 in [facility] Admission (Discharged) from OP Visit from 12/09/2012 in [facility]  Alcohol Use Disorder Identification Test Final Score (AUDIT) 0 0    GAD-7   Flowsheet Row Video Visit from 05/17/2021 in [facility] Counselor from 01/14/2020 in [facility]  Total GAD-7 Score 0 5    Mini-Mental   Flowsheet Row Clinical Support from 02/27/2018 in [facility] from 02/02/2017 in [facility] from 02/03/2016 in [facility]  Total Score (max 30 points ) 30 30 30     PHQ2-9   Flowsheet Row Video Visit from 05/17/2021 in [facility] Video Visit from 02/23/2021 in [facility]  Office Visit from 12/01/2020 in [facility] Video Visit from 10/04/2020 in [facility] Video Visit from 04/20/2020 in [facility]  PHQ-2 Total Score 0 0 0 0 0  PHQ-9 Total Score 0 -- 0 -- --    Flowsheet Row Video Visit from 02/23/2021 in [facility] Admission (Discharged) from 12/17/2019 in [facility] CATEGORY No Risk Low Risk       Assessment and Plan: FRANCESA EUGENIO is a 75 year old Caucasian female, married, retired, lives in [place], has a history of MDD, anxiety, hyperlipidemia, right is was evaluated by telemedicine today.  Patient is biologically predisposed given history of trauma.  Patient with psychosocial stressors of the pandemic is currently making progress.  Plan as noted below.  Plan MDD in remission Mirtazapine 22.5 mg p.o. nightly  GAD-stable Mirtazapine 22.5 mg p.o. nightly  Follow-up in clinic in 5 months in office or sooner if needed.  This note was generated in part or whole with voice recognition software. Voice recognition is usually quite accurate but there are transcription errors that can and very often do occur. I apologize for any typographical errors that were not detected and corrected.       Ursula Alert, MD 05/17/2021, 1:57 PM

## 2021-05-22 ENCOUNTER — Encounter: Payer: Self-pay | Admitting: Internal Medicine

## 2021-05-23 ENCOUNTER — Other Ambulatory Visit: Payer: Self-pay | Admitting: Internal Medicine

## 2021-05-23 MED ORDER — LOSARTAN POTASSIUM 50 MG PO TABS: 50.0000 mg | ORAL_TABLET | Freq: Every day | ORAL | 3 refills | Status: DC

## 2021-05-24 ENCOUNTER — Ambulatory Visit: Payer: Medicare PPO

## 2021-05-24 DIAGNOSIS — H2513 Age-related nuclear cataract, bilateral: Secondary | ICD-10-CM | POA: Diagnosis not present

## 2021-05-26 ENCOUNTER — Other Ambulatory Visit: Payer: Self-pay

## 2021-05-26 ENCOUNTER — Ambulatory Visit (INDEPENDENT_AMBULATORY_CARE_PROVIDER_SITE_OTHER): Payer: Medicare PPO

## 2021-05-26 DIAGNOSIS — E538 Deficiency of other specified B group vitamins: Secondary | ICD-10-CM | POA: Diagnosis not present

## 2021-05-26 MED ORDER — CYANOCOBALAMIN 1000 MCG/ML IJ SOLN
1000.0000 ug | Freq: Once | INTRAMUSCULAR | Status: AC
  Administered 2021-05-26: 1000 ug via INTRAMUSCULAR

## 2021-05-26 NOTE — Progress Notes (Signed)
Patient presented for B 12 injection to right deltoid, patient voiced no concerns nor showed any signs of distress during injection. 

## 2021-06-16 ENCOUNTER — Other Ambulatory Visit: Payer: Self-pay

## 2021-06-16 ENCOUNTER — Ambulatory Visit
Admission: RE | Admit: 2021-06-16 | Discharge: 2021-06-16 | Disposition: A | Discharge status: Home / Self Care | Payer: Medicare PPO | Source: Ambulatory Visit | Attending: Internal Medicine | Admitting: Internal Medicine

## 2021-06-16 DIAGNOSIS — Z1231 Encounter for screening mammogram for malignant neoplasm of breast: Secondary | ICD-10-CM | POA: Diagnosis not present

## 2021-06-28 ENCOUNTER — Ambulatory Visit (INDEPENDENT_AMBULATORY_CARE_PROVIDER_SITE_OTHER): Payer: Medicare PPO

## 2021-06-28 ENCOUNTER — Other Ambulatory Visit: Payer: Self-pay

## 2021-06-28 DIAGNOSIS — E538 Deficiency of other specified B group vitamins: Secondary | ICD-10-CM | POA: Diagnosis not present

## 2021-06-28 MED ORDER — CYANOCOBALAMIN 1000 MCG/ML IJ SOLN
1000.0000 ug | Freq: Once | INTRAMUSCULAR | Status: AC
  Administered 2021-06-28: 1000 ug via INTRAMUSCULAR

## 2021-06-28 NOTE — Progress Notes (Signed)
Pt presents for b12 injection, left deltoid, IM. Pt voiced no concerns.  

## 2021-06-29 ENCOUNTER — Encounter: Payer: Self-pay | Admitting: Internal Medicine

## 2021-06-29 ENCOUNTER — Other Ambulatory Visit: Payer: Self-pay

## 2021-06-29 MED ORDER — ROSUVASTATIN CALCIUM 20 MG PO TABS: 20.0000 mg | ORAL_TABLET | Freq: Every day | ORAL | 3 refills | Status: DC

## 2021-07-12 ENCOUNTER — Other Ambulatory Visit (INDEPENDENT_AMBULATORY_CARE_PROVIDER_SITE_OTHER): Payer: Medicare PPO

## 2021-07-12 DIAGNOSIS — I10 Essential (primary) hypertension: Secondary | ICD-10-CM | POA: Diagnosis not present

## 2021-07-12 DIAGNOSIS — E7800 Pure hypercholesterolemia, unspecified: Secondary | ICD-10-CM | POA: Diagnosis not present

## 2021-07-12 DIAGNOSIS — R739 Hyperglycemia, unspecified: Secondary | ICD-10-CM

## 2021-07-12 LAB — HEPATIC FUNCTION PANEL
ALT: 17 U/L (ref 0–35)
AST: 18 U/L (ref 0–37)
Albumin: 4.4 g/dL (ref 3.5–5.2)
Alkaline Phosphatase: 53 U/L (ref 39–117)
Bilirubin, Direct: 0.1 mg/dL (ref 0.0–0.3)
Total Bilirubin: 0.5 mg/dL (ref 0.2–1.2)
Total Protein: 6.5 g/dL (ref 6.0–8.3)

## 2021-07-12 LAB — LIPID PANEL
Cholesterol: 139 mg/dL (ref 0–200)
HDL: 54.1 mg/dL (ref >39.00)
LDL Cholesterol: 65 mg/dL (ref 0–99)
NonHDL: 85.19
Total CHOL/HDL Ratio: 3
Triglycerides: 100 mg/dL (ref 0.0–149.0)
VLDL: 20 mg/dL (ref 0.0–40.0)

## 2021-07-12 LAB — BASIC METABOLIC PANEL
BUN: 17 mg/dL (ref 6–23)
CO2: 30 mEq/L (ref 19–32)
Calcium: 9.8 mg/dL (ref 8.4–10.5)
Chloride: 104 mEq/L (ref 96–112)
Creatinine, Ser: 0.82 mg/dL (ref 0.40–1.20)
GFR: 71.21 mL/min (ref >60.00)
Glucose, Bld: 96 mg/dL (ref 70–99)
Potassium: 4 mEq/L (ref 3.5–5.1)
Sodium: 142 mEq/L (ref 135–145)

## 2021-07-12 LAB — HEMOGLOBIN A1C: Hgb A1c MFr Bld: 6.2 % (ref 4.6–6.5)

## 2021-07-14 ENCOUNTER — Encounter: Payer: Self-pay | Admitting: Internal Medicine

## 2021-07-14 ENCOUNTER — Other Ambulatory Visit: Payer: Self-pay

## 2021-07-14 ENCOUNTER — Ambulatory Visit (INDEPENDENT_AMBULATORY_CARE_PROVIDER_SITE_OTHER): Payer: Medicare PPO | Admitting: Internal Medicine

## 2021-07-14 VITALS — BP 132/78 | HR 77 | Temp 96.5°F | Ht 62.99 in | Wt 134.0 lb

## 2021-07-14 DIAGNOSIS — R739 Hyperglycemia, unspecified: Secondary | ICD-10-CM | POA: Diagnosis not present

## 2021-07-14 DIAGNOSIS — Z0000 Encounter for general adult medical examination without abnormal findings: Secondary | ICD-10-CM

## 2021-07-14 DIAGNOSIS — F3342 Major depressive disorder, recurrent, in full remission: Secondary | ICD-10-CM | POA: Diagnosis not present

## 2021-07-14 DIAGNOSIS — E7800 Pure hypercholesterolemia, unspecified: Secondary | ICD-10-CM | POA: Diagnosis not present

## 2021-07-14 DIAGNOSIS — I10 Essential (primary) hypertension: Secondary | ICD-10-CM

## 2021-07-14 NOTE — Progress Notes (Signed)
Patient ID: Angelica Robertson, female   DOB: 04/16/1946, 75 y.o.   MRN: [id number]   Subjective:    Patient ID: Angelica Robertson, female    DOB: February 18, 1946, 75 y.o.   MRN: [id number]  HPI This visit occurred during the SARS-CoV-2 public health emergency.  Safety protocols were in place, including screening questions prior to the visit, additional usage of staff PPE, and extensive cleaning of exam room while observing appropriate contact time as indicated for disinfecting solutions.   Patient here for her physical exam.  She is doing well.  Feels good.  Has adjusted her diet. Lost weight. Following - low carb diet.  Exercising.  No chest pain reported.  Breathing stable.  No increased cough or congestion.  No increased abdominal pain reported.  No bowel issues reported.  No sick contacts.  No fever.  No nausea or vomiting. Discussed labs.  Hit her left eye last week. Swelling decreased.  Looks better - per pt.  No pain currently.    Past Medical History:  Diagnosis Date   Depression 2000   Heart murmur    Hypercholesterolemia Osteoporosis   Hyperlipemia    Past Surgical History:  Procedure Laterality Date   ABDOMINAL HYSTERECTOMY  1980   COLONOSCOPY WITH PROPOFOL N/A 03/05/2018   Procedure: COLONOSCOPY WITH PROPOFOL;  Surgeon: Toledo, [name], MD;  Location: [facility] ENDOSCOPY;  Service: Endoscopy;  Laterality: N/A;   Family History  Problem Relation Age of Onset   Heart disease Mother    Diabetes Mother    Heart disease Father    Diabetes Father    Colon cancer Other    Congestive Heart Failure Brother    Colon cancer Brother    Mental illness Paternal Aunt    BRCA 1/2 Neg Hx    Breast cancer Neg Hx    Cowden syndrome Neg Hx    DES usage Neg Hx    Endometrial cancer Neg Hx    Li-Fraumeni syndrome Neg Hx    Ovarian cancer Neg Hx    Social History   Socioeconomic History   Marital status: Married    Spouse name: robert   Number of children: 2   Years of education: Not on file    Highest education level: Not on file  Occupational History   Not on file  Tobacco Use   Smoking status: Never   Smokeless tobacco: Never  Vaping Use   Vaping Use: Never used  Substance and Sexual Activity   Alcohol use: Yes    Alcohol/week: 0.0 standard drinks    Comment: occasionally - socially during the summer a glass of wine or beer   Drug use: No   Sexual activity: Yes  Other Topics Concern   Not on file  Social History Narrative   Not on file   Social Determinants of Health   Financial Resource Strain: Not on file  Food Insecurity: Not on file  Transportation Needs: Not on file  Physical Activity: Not on file  Stress: Not on file  Social Connections: Not on file    Review of Systems  Constitutional:  Negative for appetite change and unexpected weight change.  HENT:  Negative for congestion, sinus pressure and sore throat.   Eyes:  Negative for pain and visual disturbance.  Respiratory:  Negative for cough, chest tightness and shortness of breath.   Cardiovascular:  Negative for chest pain, palpitations and leg swelling.  Gastrointestinal:  Negative for abdominal pain, diarrhea, nausea and vomiting.  Genitourinary:  Negative for difficulty urinating and dysuria.  Musculoskeletal:  Negative for joint swelling and myalgias.  Skin:  Negative for color change and rash.  Neurological:  Negative for dizziness, light-headedness and headaches.  Hematological:  Negative for adenopathy. Does not bruise/bleed easily.  Psychiatric/Behavioral:  Negative for agitation and dysphoric mood.       Objective:    Physical Exam Vitals reviewed.  Constitutional:      General: She is not in acute distress.    Appearance: Normal appearance. She is well-developed.  HENT:     Head: Normocephalic and atraumatic.     Right Ear: External ear normal.     Left Ear: External ear normal.  Eyes:     General: No scleral icterus.       Right eye: No discharge.        Left eye: No  discharge.     Conjunctiva/sclera: Conjunctivae normal.     Comments: Minimal redness - surrounding left eye- no pain.  No evidence of infection.   Neck:     Thyroid: No thyromegaly.  Cardiovascular:     Rate and Rhythm: Normal rate and regular rhythm.  Pulmonary:     Effort: No tachypnea, accessory muscle usage or respiratory distress.     Breath sounds: Normal breath sounds. No decreased breath sounds or wheezing.  Chest:  Breasts:    Right: No inverted nipple, mass, nipple discharge or tenderness (no axillary adenopathy).     Left: No inverted nipple, mass, nipple discharge or tenderness (no axilarry adenopathy).  Abdominal:     General: Bowel sounds are normal.     Palpations: Abdomen is soft.     Tenderness: There is no abdominal tenderness.  Musculoskeletal:        General: No swelling or tenderness.     Cervical back: Neck supple.  Lymphadenopathy:     Cervical: No cervical adenopathy.  Skin:    Findings: No erythema or rash.  Neurological:     Mental Status: She is alert and oriented to person, place, and time.  Psychiatric:        Mood and Affect: Mood normal.        Behavior: Behavior normal.    BP 132/78   Pulse 77   Temp (!) 96.5 F (35.8 C)   Ht 5' 2.99" (1.6 m)   Wt 134 lb (60.8 kg)   LMP 11/27/1979   SpO2 98%   BMI 23.74 kg/m  Wt Readings from Last 3 Encounters:  07/14/21 134 lb (60.8 kg)  02/11/21 141 lb (64 kg)  12/01/20 146 lb (66.2 kg)    Outpatient Encounter Medications as of 07/14/2021  Medication Sig   calcium carbonate (OS-CAL) 600 MG TABS tablet Take 600 mg by mouth 2 (two) times daily with a meal.   Glucosamine-Chondroitin 250-200 MG CAPS Take 1 tablet by mouth daily. TAKES 25 MCG   losartan (COZAAR) 50 MG tablet TAKE 1 TABLET BY MOUTH ONCE DAILY   mirtazapine (REMERON) 15 MG tablet Take 1.5 tablets (22.5 mg total) by mouth at bedtime.   rosuvastatin (CRESTOR) 20 MG tablet Take 1 tablet (20 mg total) by mouth at bedtime.   No  facility-administered encounter medications on file as of 07/14/2021.     Lab Results  Component Value Date   WBC 5.6 11/29/2020   HGB 13.6 11/29/2020   HCT 39.8 11/29/2020   PLT 270.0 11/29/2020   GLUCOSE 96 07/12/2021   CHOL 139 07/12/2021   TRIG 100.0 07/12/2021  HDL 54.10 07/12/2021   LDLCALC 65 07/12/2021   ALT 17 07/12/2021   AST 18 07/12/2021   NA 142 07/12/2021   K 4.0 07/12/2021   CL 104 07/12/2021   CREATININE 0.82 07/12/2021   BUN 17 07/12/2021   CO2 30 07/12/2021   TSH 3.32 11/29/2020   HGBA1C 6.2 07/12/2021    MM 3D SCREEN BREAST BILATERAL  Result Date: 06/20/2021 CLINICAL DATA:  Screening. EXAM: DIGITAL SCREENING BILATERAL MAMMOGRAM WITH TOMOSYNTHESIS AND CAD TECHNIQUE: Bilateral screening digital craniocaudal and mediolateral oblique mammograms were obtained. Bilateral screening digital breast tomosynthesis was performed. The images were evaluated with computer-aided detection. COMPARISON:  Previous exam(s). ACR Breast Density Category b: There are scattered areas of fibroglandular density. FINDINGS: There are no findings suspicious for malignancy. IMPRESSION: No mammographic evidence of malignancy. A result letter of this screening mammogram will be mailed directly to the patient. RECOMMENDATION: Screening mammogram in one year. (Code:SM-B-01Y) BI-RADS CATEGORY  1: Negative. Electronically Signed   By: Lajean Manes M.D.   On: 06/20/2021 07:40      Assessment & Plan:   Problem List Items Addressed This Visit     Health care maintenance    Physical today 07/14/21.  Mammogram 06/20/21 - Birads I.  Colonoscopy 02/2018 - entire colon normal.  Non bleeding internal hemorrhoids.         Hypercholesterolemia    On crestor.  Low cholesterol diet and exercise.  Follow lipid panel and liver function tests.         Relevant Orders   Hepatic function panel   Lipid panel   Hyperglycemia    Has adjusted diet - low carb diet.  Is exercising.  Has lost weight.   Follow  met b and a1c.        Relevant Orders   Hemoglobin A1c   Hypertension    Reviewed outside blood pressure readings - averaging 108-120s/60s.  Continue losartan.  Follow pressures.  Follow metabolic panel.        Relevant Orders   Basic metabolic panel   MDD (major depressive disorder), recurrent, in full remission ([place])    Doing well on remeron.  Feels good.  Follow. Followed by psychiatry.        Other Visit Diagnoses     Routine general medical examination at a health care facility    -  Primary        Einar Pheasant, MD

## 2021-07-14 NOTE — Assessment & Plan Note (Signed)
Physical today 07/14/21.  Mammogram 06/20/21 - Birads I.  Colonoscopy 02/2018 - entire colon normal.  Non bleeding internal hemorrhoids.

## 2021-07-18 ENCOUNTER — Encounter: Payer: Self-pay | Admitting: Internal Medicine

## 2021-07-18 NOTE — Assessment & Plan Note (Addendum)
Doing well on remeron.  Feels good.  Follow. Followed by psychiatry.

## 2021-07-18 NOTE — Assessment & Plan Note (Signed)
On crestor.  Low cholesterol diet and exercise.  Follow lipid panel and liver function tests.   

## 2021-07-18 NOTE — Assessment & Plan Note (Signed)
Reviewed outside blood pressure readings - averaging 108-120s/60s.  Continue losartan.  Follow pressures.  Follow metabolic panel.

## 2021-07-18 NOTE — Assessment & Plan Note (Signed)
Has adjusted diet - low carb diet.  Is exercising.  Has lost weight.   Follow met b and a1c.

## 2021-07-29 ENCOUNTER — Ambulatory Visit: Payer: Medicare PPO

## 2021-08-01 ENCOUNTER — Other Ambulatory Visit: Payer: Self-pay

## 2021-08-01 ENCOUNTER — Ambulatory Visit (INDEPENDENT_AMBULATORY_CARE_PROVIDER_SITE_OTHER): Payer: Medicare PPO

## 2021-08-01 DIAGNOSIS — E538 Deficiency of other specified B group vitamins: Secondary | ICD-10-CM | POA: Diagnosis not present

## 2021-08-01 MED ORDER — CYANOCOBALAMIN 1000 MCG/ML IJ SOLN
1000.0000 ug | Freq: Once | INTRAMUSCULAR | Status: AC
  Administered 2021-08-01: 1000 ug via INTRAMUSCULAR

## 2021-08-01 NOTE — Progress Notes (Addendum)
Patient presented for B 12 injection to left deltoid, patient voiced no concerns nor showed any signs of distress during injection. 

## 2021-09-01 ENCOUNTER — Ambulatory Visit: Payer: Medicare PPO

## 2021-09-05 ENCOUNTER — Other Ambulatory Visit: Payer: Self-pay | Admitting: Psychiatry

## 2021-09-05 DIAGNOSIS — F3342 Major depressive disorder, recurrent, in full remission: Secondary | ICD-10-CM

## 2021-09-05 DIAGNOSIS — F411 Generalized anxiety disorder: Secondary | ICD-10-CM

## 2021-09-06 ENCOUNTER — Other Ambulatory Visit: Payer: Self-pay

## 2021-09-06 ENCOUNTER — Ambulatory Visit (INDEPENDENT_AMBULATORY_CARE_PROVIDER_SITE_OTHER): Payer: Medicare PPO

## 2021-09-06 DIAGNOSIS — E538 Deficiency of other specified B group vitamins: Secondary | ICD-10-CM | POA: Diagnosis not present

## 2021-09-06 MED ORDER — CYANOCOBALAMIN 1000 MCG/ML IJ SOLN
1000.0000 ug | Freq: Once | INTRAMUSCULAR | Status: AC
  Administered 2021-09-06: 1000 ug via INTRAMUSCULAR

## 2021-09-06 NOTE — Progress Notes (Signed)
Patient presented for B 12 injection to left deltoid, patient voiced no concerns nor showed any signs of distress during injection. 

## 2021-10-06 ENCOUNTER — Ambulatory Visit: Payer: Medicare PPO

## 2021-10-07 ENCOUNTER — Ambulatory Visit (INDEPENDENT_AMBULATORY_CARE_PROVIDER_SITE_OTHER): Payer: Medicare PPO

## 2021-10-07 ENCOUNTER — Other Ambulatory Visit: Payer: Self-pay

## 2021-10-07 DIAGNOSIS — E538 Deficiency of other specified B group vitamins: Secondary | ICD-10-CM

## 2021-10-07 MED ORDER — CYANOCOBALAMIN 1000 MCG/ML IJ SOLN
1000.0000 ug | Freq: Once | INTRAMUSCULAR | Status: AC
  Administered 2021-10-07: 1000 ug via INTRAMUSCULAR

## 2021-10-07 NOTE — Progress Notes (Signed)
Patient presented for B 12 injection to left deltoid, patient voiced no concerns nor showed any signs of distress during injection. 

## 2021-10-18 ENCOUNTER — Ambulatory Visit (INDEPENDENT_AMBULATORY_CARE_PROVIDER_SITE_OTHER): Payer: Medicare PPO | Admitting: Psychiatry

## 2021-10-18 ENCOUNTER — Other Ambulatory Visit: Payer: Self-pay

## 2021-10-18 ENCOUNTER — Encounter: Payer: Self-pay | Admitting: Psychiatry

## 2021-10-18 VITALS — BP 145/77 | HR 116 | Temp 98.7°F | Wt 136.6 lb

## 2021-10-18 DIAGNOSIS — F411 Generalized anxiety disorder: Secondary | ICD-10-CM

## 2021-10-18 DIAGNOSIS — F3342 Major depressive disorder, recurrent, in full remission: Secondary | ICD-10-CM | POA: Diagnosis not present

## 2021-10-18 NOTE — Progress Notes (Signed)
[facility] MD OP Progress Note  10/18/2021 1:45 PM Angelica Robertson  MRN:  [id number]  Chief Complaint:  Chief Complaint   Follow-up; Anxiety; Depression    HPI: Angelica Robertson is a 75 year old Caucasian female, married, retired, lives in [place], has a history of MDD, GAD, hyperlipidemia, arthritis was evaluated in office today.  Patient today reports overall she is doing well.  Denies any sadness, anhedonia.  Reports appetite continues to be good.  Reports sleep is fair.  She continues to enjoy reading books, reads a book every 3 days or so.  She enjoys going to [employer] to pick up these books.  She reports she continues to be part of a small group and they meet every other Friday to do Bible study as well as play cards.  She does report she is worried about her husband who is currently going through medical problems.  She however has been coping okay.  Denies suicidality, homicidality or perceptual disturbances.  Patient denies any other concerns today.  Visit Diagnosis:    ICD-10-CM   1. MDD (major depressive disorder), recurrent, in full remission ([place])  F33.42     2. GAD (generalized anxiety disorder)  F41.1       Past Psychiatric History: Reviewed past psychiatric history from progress note on 01/12/2020  Past Medical History:  Past Medical History:  Diagnosis Date   Depression 2000   Heart murmur    Hypercholesterolemia Osteoporosis   Hyperlipemia     Past Surgical History:  Procedure Laterality Date   ABDOMINAL HYSTERECTOMY  1980   COLONOSCOPY WITH PROPOFOL N/A 03/05/2018   Procedure: COLONOSCOPY WITH PROPOFOL;  Surgeon: Toledo, [name], MD;  Location: [facility] ENDOSCOPY;  Service: Endoscopy;  Laterality: N/A;    Family Psychiatric History: Reviewed family psychiatric history from progress note on 01/12/2020  Family History:  Family History  Problem Relation Age of Onset   Heart disease Mother    Diabetes Mother    Heart disease Father    Diabetes Father    Colon cancer  Other    Congestive Heart Failure Brother    Colon cancer Brother    Mental illness Paternal Aunt    BRCA 1/2 Neg Hx    Breast cancer Neg Hx    Cowden syndrome Neg Hx    DES usage Neg Hx    Endometrial cancer Neg Hx    Li-Fraumeni syndrome Neg Hx    Ovarian cancer Neg Hx     Social History: Reviewed social history from progress note on 01/12/2020 Social History   Socioeconomic History   Marital status: Married    Spouse name: robert   Number of children: 2   Years of education: Not on file   Highest education level: Not on file  Occupational History   Not on file  Tobacco Use   Smoking status: Never   Smokeless tobacco: Never  Vaping Use   Vaping Use: Never used  Substance and Sexual Activity   Alcohol use: Yes    Alcohol/week: 0.0 standard drinks    Comment: occasionally - socially during the summer a glass of wine or beer   Drug use: No   Sexual activity: Yes  Other Topics Concern   Not on file  Social History Narrative   Not on file   Social Determinants of Health   Financial Resource Strain: Not on file  Food Insecurity: Not on file  Transportation Needs: Not on file  Physical Activity: Not on file  Stress:  Not on file  Social Connections: Not on file    Allergies: No Known Allergies  Metabolic Disorder Labs: Lab Results  Component Value Date   HGBA1C 6.2 07/12/2021   No results found for: PROLACTIN Lab Results  Component Value Date   CHOL 139 07/12/2021   TRIG 100.0 07/12/2021   HDL 54.10 07/12/2021   CHOLHDL 3 07/12/2021   VLDL 20.0 07/12/2021   LDLCALC 65 07/12/2021   LDLCALC 71 11/29/2020   Lab Results  Component Value Date   TSH 3.32 11/29/2020   TSH 3.160 12/18/2019    Therapeutic Level Labs: No results found for: LITHIUM No results found for: VALPROATE No components found for:  CBMZ  Current Medications: Current Outpatient Medications  Medication Sig Dispense Refill   calcium carbonate (OS-CAL) 600 MG TABS tablet Take 600 mg  by mouth 2 (two) times daily with a meal.     Glucosamine-Chondroitin 250-200 MG CAPS Take 1 tablet by mouth daily. TAKES 25 MCG     losartan (COZAAR) 50 MG tablet TAKE 1 TABLET BY MOUTH ONCE DAILY (Patient taking differently: 25 mg. TAKES 25 MG DAILY PER REPORT) 30 tablet 3   mirtazapine (REMERON) 15 MG tablet TAKE 1&1/2 TABLETS BY MOUTH AT BEDTIME 135 tablet 1   rosuvastatin (CRESTOR) 20 MG tablet Take 1 tablet (20 mg total) by mouth at bedtime. 90 tablet 3   No current facility-administered medications for this visit.     Musculoskeletal: Strength & Muscle Tone: within normal limits Gait & Station: normal Patient leans: N/A  Psychiatric Specialty Exam: Review of Systems  Psychiatric/Behavioral:  The patient is nervous/anxious.   All other systems reviewed and are negative.  Blood pressure (!) 145/77, pulse (!) 116, temperature 98.7 F (37.1 C), temperature source Temporal, weight 136 lb 9.6 oz (62 kg), last menstrual period 11/27/1979.Body mass index is 24.2 kg/m.  General Appearance: Casual  Eye Contact:  Fair  Speech:  Clear and Coherent  Volume:  Normal  Mood:  Anxious coping well  Affect:  Congruent  Thought Process:  Goal Directed and Descriptions of Associations: Intact  Orientation:  Full (Time, Place, and Person)  Thought Content: Logical   Suicidal Thoughts:  No  Homicidal Thoughts:  No  Memory:  Immediate;   Fair Recent;   Fair Remote;   Fair  Judgement:  Fair  Insight:  Fair  Psychomotor Activity:  Normal  Concentration:  Concentration: Fair and Attention Span: Fair  Recall:  [employer] of Knowledge: Fair  Language: Fair  Akathisia:  No  Handed:  Right  AIMS (if indicated): done, 0  Assets:  Communication Skills Desire for Improvement Housing Social Support Transportation Vocational/Educational  ADL's:  Intact  Cognition: WNL  Sleep:  Fair   Screenings: AUDIT    Flowsheet Row Admission (Discharged) from 12/17/2019 in [facility] Admission (Discharged) from OP Visit from 12/09/2012 in [facility]  Alcohol Use Disorder Identification Test Final Score (AUDIT) 0 0      GAD-7    Flowsheet Row Video Visit from 05/17/2021 in [facility] Counselor from 01/14/2020 in [facility]  Total GAD-7 Score 0 5      Mini-Mental    [place] from 02/27/2018 in [facility] from 02/02/2017 in [place] from 02/03/2016 in [facility]  Total Score (max 30 points ) 30 30 30  [place] Office Visit from 10/18/2021 in [facility] Office Visit from 07/14/2021 in [facility] Video Visit from 05/17/2021 in [facility] Video Visit from 02/23/2021 in [facility] [place] Office Visit from 12/01/2020 in [facility]  PHQ-2 Total Score 0 0 0 0 0  PHQ-9 Total Score -- -- 0 -- 0      Flowsheet Row Office Visit from 10/18/2021 in [facility] Video Visit from 02/23/2021 in [facility] Admission (Discharged) from 12/17/2019 in [facility] No Risk No Risk Low Risk        Assessment and Plan: Angelica Robertson is a 75 year old Caucasian female, married, retired, lives in [place], has a history of MDD, anxiety, hyperlipidemia, was evaluated in office today.  Patient is currently stable.  Plan as noted below.  Plan MDD in remission Mirtazapine 22.5 mg p.o. nightly  GAD-stable Mirtazapine 22.5 mg p.o. nightly  Patient to monitor her blood pressure and heart rate, she has been taking it every other day at home and reports it has been stable so far.  I have also reviewed notes per primary care provider  07/14/2021-Dr. Scott-her blood pressure and heart rate was within normal limits.  Follow-up in clinic in 3 months or sooner if needed.  This note was generated in part or whole with voice recognition software. Voice recognition is usually quite accurate but there are transcription errors that can and very often do occur. I apologize for any typographical errors that were not detected and corrected.       Ursula Alert, MD 10/18/2021, 1:45 PM

## 2021-11-08 ENCOUNTER — Ambulatory Visit (INDEPENDENT_AMBULATORY_CARE_PROVIDER_SITE_OTHER): Payer: Medicare PPO

## 2021-11-08 ENCOUNTER — Other Ambulatory Visit: Payer: Self-pay

## 2021-11-08 DIAGNOSIS — E538 Deficiency of other specified B group vitamins: Secondary | ICD-10-CM

## 2021-11-08 MED ORDER — CYANOCOBALAMIN 1000 MCG/ML IJ SOLN
1000.0000 ug | Freq: Once | INTRAMUSCULAR | Status: AC
  Administered 2021-11-08: 1000 ug via INTRAMUSCULAR

## 2021-11-08 NOTE — Progress Notes (Signed)
Patient presented for B 12 injection to left deltoid, patient voiced no concerns nor showed any signs of distress during injection. 

## 2021-11-18 ENCOUNTER — Other Ambulatory Visit: Payer: Self-pay

## 2021-11-18 ENCOUNTER — Other Ambulatory Visit (INDEPENDENT_AMBULATORY_CARE_PROVIDER_SITE_OTHER): Payer: Medicare PPO

## 2021-11-18 DIAGNOSIS — I10 Essential (primary) hypertension: Secondary | ICD-10-CM

## 2021-11-18 DIAGNOSIS — R739 Hyperglycemia, unspecified: Secondary | ICD-10-CM

## 2021-11-18 DIAGNOSIS — E7800 Pure hypercholesterolemia, unspecified: Secondary | ICD-10-CM | POA: Diagnosis not present

## 2021-11-18 LAB — LIPID PANEL
Cholesterol: 151 mg/dL (ref 0–200)
HDL: 58.4 mg/dL (ref >39.00)
LDL Cholesterol: 71 mg/dL (ref 0–99)
NonHDL: 93.07
Total CHOL/HDL Ratio: 3
Triglycerides: 110 mg/dL (ref 0.0–149.0)
VLDL: 22 mg/dL (ref 0.0–40.0)

## 2021-11-18 LAB — BASIC METABOLIC PANEL
BUN: 18 mg/dL (ref 6–23)
CO2: 28 mEq/L (ref 19–32)
Calcium: 10.1 mg/dL (ref 8.4–10.5)
Chloride: 104 mEq/L (ref 96–112)
Creatinine, Ser: 0.87 mg/dL (ref 0.40–1.20)
GFR: 66.16 mL/min (ref >60.00)
Glucose, Bld: 95 mg/dL (ref 70–99)
Potassium: 4.2 mEq/L (ref 3.5–5.1)
Sodium: 140 mEq/L (ref 135–145)

## 2021-11-18 LAB — HEMOGLOBIN A1C: Hgb A1c MFr Bld: 6.2 % (ref 4.6–6.5)

## 2021-11-18 LAB — HEPATIC FUNCTION PANEL
ALT: 17 U/L (ref 0–35)
AST: 21 U/L (ref 0–37)
Albumin: 4.5 g/dL (ref 3.5–5.2)
Alkaline Phosphatase: 46 U/L (ref 39–117)
Bilirubin, Direct: 0.1 mg/dL (ref 0.0–0.3)
Total Bilirubin: 0.5 mg/dL (ref 0.2–1.2)
Total Protein: 6.9 g/dL (ref 6.0–8.3)

## 2021-11-22 ENCOUNTER — Other Ambulatory Visit: Payer: Self-pay

## 2021-11-22 ENCOUNTER — Encounter: Payer: Self-pay | Admitting: Internal Medicine

## 2021-11-22 ENCOUNTER — Ambulatory Visit: Payer: Medicare PPO | Admitting: Internal Medicine

## 2021-11-22 VITALS — BP 124/70 | HR 72 | Temp 97.6°F | Resp 16 | Ht 63.0 in | Wt 137.4 lb

## 2021-11-22 DIAGNOSIS — I10 Essential (primary) hypertension: Secondary | ICD-10-CM

## 2021-11-22 DIAGNOSIS — E7800 Pure hypercholesterolemia, unspecified: Secondary | ICD-10-CM | POA: Diagnosis not present

## 2021-11-22 DIAGNOSIS — R739 Hyperglycemia, unspecified: Secondary | ICD-10-CM | POA: Diagnosis not present

## 2021-11-22 DIAGNOSIS — F419 Anxiety disorder, unspecified: Secondary | ICD-10-CM | POA: Diagnosis not present

## 2021-11-22 DIAGNOSIS — F3342 Major depressive disorder, recurrent, in full remission: Secondary | ICD-10-CM

## 2021-11-22 MED ORDER — LOSARTAN POTASSIUM 25 MG PO TABS: 25.0000 mg | ORAL_TABLET | Freq: Every day | ORAL | 1 refills | Status: DC

## 2021-11-22 NOTE — Progress Notes (Signed)
Patient ID: Angelica Robertson, female   DOB: 24-Apr-1946, 74 y.o.   MRN: [id number]   Subjective:    Patient ID: Angelica Robertson, female    DOB: [birth year], 75 y.o.   MRN: [id number]  This visit occurred during the SARS-CoV-2 public health emergency.  Safety protocols were in place, including screening questions prior to the visit, additional usage of staff PPE, and extensive cleaning of exam room while observing appropriate contact time as indicated for disinfecting solutions.   Patient here for a scheduled follow up.    HPI Here to follow up regarding her blood pressure and cholesterol.  Some increased stress with her husband's medical issues.  Overall she is handling things well.  Sees Dr Budd Palmer. Doing well on her current medication regimen.  No chest pain or sob.  Exercises regularly.  No acid reflux or abdominal pain reported.  No bowel change.  Reviewed outside blood pressure readings - averaging:  116-130/60-70s.  On 60m losartan.     Past Medical History:  Diagnosis Date   Depression 2000   Heart murmur    Hypercholesterolemia Osteoporosis   Hyperlipemia    Past Surgical History:  Procedure Laterality Date   ABDOMINAL HYSTERECTOMY  1980   COLONOSCOPY WITH PROPOFOL N/A 03/05/2018   Procedure: COLONOSCOPY WITH PROPOFOL;  Surgeon: Toledo, TBenay Pike MD;  Location: [facility] ENDOSCOPY;  Service: Endoscopy;  Laterality: N/A;   Family History  Problem Relation Age of Onset   Heart disease Mother    Diabetes Mother    Heart disease Father    Diabetes Father    Colon cancer Other    Congestive Heart Failure Brother    Colon cancer Brother    Mental illness Paternal Aunt    BRCA 1/2 Neg Hx    Breast cancer Neg Hx    Cowden syndrome Neg Hx    DES usage Neg Hx    Endometrial cancer Neg Hx    Li-Fraumeni syndrome Neg Hx    Ovarian cancer Neg Hx    Social History   Socioeconomic History   Marital status: Married    Spouse name: robert   Number of children: 2   Years of education: Not on  file   Highest education level: Not on file  Occupational History   Not on file  Tobacco Use   Smoking status: Never   Smokeless tobacco: Never  Vaping Use   Vaping Use: Never used  Substance and Sexual Activity   Alcohol use: Yes    Alcohol/week: 0.0 standard drinks    Comment: occasionally - socially during the summer a glass of wine or beer   Drug use: No   Sexual activity: Yes  Other Topics Concern   Not on file  Social History Narrative   Not on file   Social Determinants of Health   Financial Resource Strain: Not on file  Food Insecurity: Not on file  Transportation Needs: Not on file  Physical Activity: Not on file  Stress: Not on file  Social Connections: Not on file     Review of Systems  Constitutional:  Negative for appetite change and unexpected weight change.  HENT:  Negative for congestion and sinus pressure.   Respiratory:  Negative for cough, chest tightness and shortness of breath.   Cardiovascular:  Negative for chest pain and palpitations.  Gastrointestinal:  Negative for abdominal pain, diarrhea, nausea and vomiting.  Genitourinary:  Negative for difficulty urinating and dysuria.  Musculoskeletal:  Negative for joint swelling and  myalgias.       Neck is doing well.   Skin:  Negative for color change and rash.  Neurological:  Negative for dizziness, light-headedness and headaches.  Psychiatric/Behavioral:  Negative for agitation and dysphoric mood.       Objective:     BP 124/70    Pulse 72    Temp 97.6 F (36.4 C)    Resp 16    Ht 5' 3"  (1.6 m)    Wt 137 lb 6.4 oz (62.3 kg)    LMP 11/27/1979    SpO2 98%    BMI 24.34 kg/m  Wt Readings from Last 3 Encounters:  11/22/21 137 lb 6.4 oz (62.3 kg)  07/14/21 134 lb (60.8 kg)  02/11/21 141 lb (64 kg)    Physical Exam Vitals reviewed.  Constitutional:      General: She is not in acute distress.    Appearance: Normal appearance.  HENT:     Head: Normocephalic and atraumatic.     Right Ear:  External ear normal.     Left Ear: External ear normal.  Eyes:     General: No scleral icterus.       Right eye: No discharge.        Left eye: No discharge.     Conjunctiva/sclera: Conjunctivae normal.  Neck:     Thyroid: No thyromegaly.  Cardiovascular:     Rate and Rhythm: Normal rate and regular rhythm.  Pulmonary:     Effort: No respiratory distress.     Breath sounds: Normal breath sounds. No wheezing.  Abdominal:     General: Bowel sounds are normal.     Palpations: Abdomen is soft.     Tenderness: There is no abdominal tenderness.  Musculoskeletal:        General: No swelling or tenderness.     Cervical back: Neck supple. No tenderness.  Lymphadenopathy:     Cervical: No cervical adenopathy.  Skin:    Findings: No erythema or rash.  Neurological:     Mental Status: She is alert.  Psychiatric:        Mood and Affect: Mood normal.        Behavior: Behavior normal.     Outpatient Encounter Medications as of 11/22/2021  Medication Sig   losartan (COZAAR) 25 MG tablet Take 1 tablet (25 mg total) by mouth daily.   calcium carbonate (OS-CAL) 600 MG TABS tablet Take 600 mg by mouth 2 (two) times daily with a meal.   Glucosamine-Chondroitin 250-200 MG CAPS Take 1 tablet by mouth daily. TAKES 25 MCG   mirtazapine (REMERON) 15 MG tablet TAKE 1&1/2 TABLETS BY MOUTH AT BEDTIME   rosuvastatin (CRESTOR) 20 MG tablet Take 1 tablet (20 mg total) by mouth at bedtime.   [DISCONTINUED] losartan (COZAAR) 50 MG tablet TAKE 1 TABLET BY MOUTH ONCE DAILY (Patient taking differently: 25 mg. TAKES 25 MG DAILY PER REPORT)   No facility-administered encounter medications on file as of 11/22/2021.     Lab Results  Component Value Date   WBC 5.6 11/29/2020   HGB 13.6 11/29/2020   HCT 39.8 11/29/2020   PLT 270.0 11/29/2020   GLUCOSE 95 11/18/2021   CHOL 151 11/18/2021   TRIG 110.0 11/18/2021   HDL 58.40 11/18/2021   LDLCALC 71 11/18/2021   ALT 17 11/18/2021   AST 21 11/18/2021   NA  140 11/18/2021   K 4.2 11/18/2021   CL 104 11/18/2021   CREATININE 0.87 11/18/2021   BUN 18  11/18/2021   CO2 28 11/18/2021   TSH 3.32 11/29/2020   HGBA1C 6.2 11/18/2021    MM 3D SCREEN BREAST BILATERAL  Result Date: 06/20/2021 CLINICAL DATA:  Screening. EXAM: DIGITAL SCREENING BILATERAL MAMMOGRAM WITH TOMOSYNTHESIS AND CAD TECHNIQUE: Bilateral screening digital craniocaudal and mediolateral oblique mammograms were obtained. Bilateral screening digital breast tomosynthesis was performed. The images were evaluated with computer-aided detection. COMPARISON:  Previous exam(s). ACR Breast Density Category b: There are scattered areas of fibroglandular density. FINDINGS: There are no findings suspicious for malignancy. IMPRESSION: No mammographic evidence of malignancy. A result letter of this screening mammogram will be mailed directly to the patient. RECOMMENDATION: Screening mammogram in one year. (Code:[id number]-B-01Y) BI-RADS CATEGORY  1: Negative. Electronically Signed   By: Lajean Manes M.D.   On: 06/20/2021 07:40      Assessment & Plan:   Problem List Items Addressed This Visit     Anxiety    On remeron. Doing well.  Follow.  Continue f/u with psychiatry.       Hypercholesterolemia    On crestor.  Low cholesterol diet and exercise.  Follow lipid panel and liver function tests.        Relevant Medications   losartan (COZAAR) 25 MG tablet   Other Relevant Orders   Hepatic function panel   Lipid panel   Hyperglycemia    Has adjusted her diet.  Lost weight.  Follow met b and a1c.       Relevant Orders   Hemoglobin A1c   Hypertension - Primary    Reviewed outside blood pressure readings - averaging 116-130/60-70s.   Continue losartan 30m q day.  Follow pressures.  Follow metabolic panel.       Relevant Medications   losartan (COZAAR) 25 MG tablet   Other Relevant Orders   CBC with Differential/Platelet   Basic metabolic panel   TSH   MDD (major depressive disorder),  recurrent, in full remission ([place]    Doing much better.  Followed by psychiatry.  Continues on remeron.  Follow.          CEinar Pheasant MD

## 2021-11-23 ENCOUNTER — Encounter: Payer: Self-pay | Admitting: Internal Medicine

## 2021-11-23 NOTE — Assessment & Plan Note (Signed)
Doing much better.  Followed by psychiatry.  Continues on remeron.  Follow.

## 2021-11-23 NOTE — Assessment & Plan Note (Signed)
On crestor.  Low cholesterol diet and exercise.  Follow lipid panel and liver function tests.   

## 2021-11-23 NOTE — Assessment & Plan Note (Signed)
Has adjusted her diet.  Lost weight.  Follow met b and a1c.

## 2021-11-23 NOTE — Assessment & Plan Note (Signed)
On remeron. Doing well.  Follow.  Continue f/u with psychiatry.

## 2021-11-23 NOTE — Assessment & Plan Note (Signed)
Reviewed outside blood pressure readings - averaging 116-130/60-70s.   Continue losartan 25mg  q day.  Follow pressures.  Follow metabolic panel.

## 2021-12-19 ENCOUNTER — Ambulatory Visit (INDEPENDENT_AMBULATORY_CARE_PROVIDER_SITE_OTHER): Payer: Medicare PPO | Admitting: *Deleted

## 2021-12-19 ENCOUNTER — Other Ambulatory Visit: Payer: Self-pay

## 2021-12-19 DIAGNOSIS — E538 Deficiency of other specified B group vitamins: Secondary | ICD-10-CM | POA: Diagnosis not present

## 2021-12-19 MED ORDER — CYANOCOBALAMIN 1000 MCG/ML IJ SOLN
1000.0000 ug | Freq: Once | INTRAMUSCULAR | Status: AC
  Administered 2021-12-19: 1000 ug via INTRAMUSCULAR

## 2021-12-19 NOTE — Progress Notes (Signed)
Patient presented for B 12 injection to right deltoid, patient voiced no concerns nor showed any signs of distress during injection. 

## 2022-01-18 ENCOUNTER — Telehealth (INDEPENDENT_AMBULATORY_CARE_PROVIDER_SITE_OTHER): Payer: Medicare PPO | Admitting: Psychiatry

## 2022-01-18 ENCOUNTER — Other Ambulatory Visit: Payer: Self-pay

## 2022-01-18 ENCOUNTER — Encounter: Payer: Self-pay | Admitting: Psychiatry

## 2022-01-18 DIAGNOSIS — F3342 Major depressive disorder, recurrent, in full remission: Secondary | ICD-10-CM

## 2022-01-18 DIAGNOSIS — F411 Generalized anxiety disorder: Secondary | ICD-10-CM | POA: Diagnosis not present

## 2022-01-18 MED ORDER — MIRTAZAPINE 15 MG PO TABS: ORAL_TABLET | ORAL | 1 refills | Status: DC

## 2022-01-18 NOTE — Progress Notes (Signed)
Virtual Visit via Video Note  I connected with Angelica Robertson on 01/18/22 at 11:30 AM EST by a video enabled telemedicine application and verified that I am speaking with the correct person using two identifiers.  Location Provider Location : [facility] Patient Location : Home  Participants: Patient , Provider    I discussed the limitations of evaluation and management by telemedicine and the availability of in person appointments. The patient expressed understanding and agreed to proceed.    I discussed the assessment and treatment plan with the patient. The patient was provided an opportunity to ask questions and all were answered. The patient agreed with the plan and demonstrated an understanding of the instructions.   The patient was advised to call back or seek an in-person evaluation if the symptoms worsen or if the condition fails to improve as anticipated.   [place] MD OP Progress Note  01/18/2022 11:49 AM NIKYLA NAVEDO  MRN:  [id number]  Chief Complaint:  Chief Complaint   Follow-up, 76 year old Caucasian female with history of MDD, GAD, hyperlipidemia, presented for medication management.    HPI: Angelica Robertson is a 76 year old Caucasian female, married, retired, lives in [place], has a history of MDD, GAD, hyperlipidemia, arthritis was evaluated by telemedicine today.  Patient today reports she had a good holiday season.  She was able to spend time with her family.  She however is relieved that it is over since holidays can be stressful.  She was able to get together with her family this past weekend for her grandchild's birthday.  It is her son's birthday today and she is planning to throw him a small party this coming weekend.Patient reports she continues to stay busy reading books, going to Bible study, going to church as well as going to [employer] daily.  She reports mood symptoms are currently stable.  She reports sleep is good.  Reports appetite is fair.  She is compliant on the  mirtazapine.  Denies side effects.  Denies any suicidality, homicidality or perceptual disturbances.  Denies any other concerns today.  Visit Diagnosis:    ICD-10-CM   1. MDD (major depressive disorder), recurrent, in full remission ([place])  F33.42 mirtazapine (REMERON) 15 MG tablet    2. GAD (generalized anxiety disorder)  F41.1 mirtazapine (REMERON) 15 MG tablet      Past Psychiatric History: Reviewed past psychiatric history from progress note on 01/12/2020.  Past Medical History:  Past Medical History:  Diagnosis Date   Depression 2000   Heart murmur    Hypercholesterolemia Osteoporosis   Hyperlipemia     Past Surgical History:  Procedure Laterality Date   ABDOMINAL HYSTERECTOMY  1980   COLONOSCOPY WITH PROPOFOL N/A 03/05/2018   Procedure: COLONOSCOPY WITH PROPOFOL;  Surgeon: Toledo, [name], MD;  Location: [facility] ENDOSCOPY;  Service: Endoscopy;  Laterality: N/A;    Family Psychiatric History: Reviewed family psychiatric history from progress note on 01/12/2020.  Family History:  Family History  Problem Relation Age of Onset   Heart disease Mother    Diabetes Mother    Heart disease Father    Diabetes Father    Colon cancer Other    Congestive Heart Failure Brother    Colon cancer Brother    Mental illness Paternal Aunt    BRCA 1/2 Neg Hx    Breast cancer Neg Hx    Cowden syndrome Neg Hx    DES usage Neg Hx    Endometrial cancer Neg Hx    Li-Fraumeni syndrome  Neg Hx    Ovarian cancer Neg Hx     Social History: Reviewed social history from progress note on 01/12/2020. Social History   Socioeconomic History   Marital status: Married    Spouse name: robert   Number of children: 2   Years of education: Not on file   Highest education level: Not on file  Occupational History   Not on file  Tobacco Use   Smoking status: Never   Smokeless tobacco: Never  Vaping Use   Vaping Use: Never used  Substance and Sexual Activity   Alcohol use: Yes    Alcohol/week:  0.0 standard drinks    Comment: occasionally - socially during the summer a glass of wine or beer   Drug use: No   Sexual activity: Yes  Other Topics Concern   Not on file  Social History Narrative   Not on file   Social Determinants of Health   Financial Resource Strain: Not on file  Food Insecurity: Not on file  Transportation Needs: Not on file  Physical Activity: Not on file  Stress: Not on file  Social Connections: Not on file    Allergies: No Known Allergies  Metabolic Disorder Labs: Lab Results  Component Value Date   HGBA1C 6.2 11/18/2021   No results found for: PROLACTIN Lab Results  Component Value Date   CHOL 151 11/18/2021   TRIG 110.0 11/18/2021   HDL 58.40 11/18/2021   CHOLHDL 3 11/18/2021   VLDL 22.0 11/18/2021   LDLCALC 71 11/18/2021   LDLCALC 65 07/12/2021   Lab Results  Component Value Date   TSH 3.32 11/29/2020   TSH 3.160 12/18/2019    Therapeutic Level Labs: No results found for: LITHIUM No results found for: VALPROATE No components found for:  CBMZ  Current Medications: Current Outpatient Medications  Medication Sig Dispense Refill   calcium carbonate (OS-CAL) 600 MG TABS tablet Take 600 mg by mouth 2 (two) times daily with a meal.     Glucosamine-Chondroitin 250-200 MG CAPS Take 1 tablet by mouth daily. TAKES 25 MCG     losartan (COZAAR) 25 MG tablet Take 1 tablet (25 mg total) by mouth daily. 90 tablet 1   [START ON 03/06/2022] mirtazapine (REMERON) 15 MG tablet TAKE 1&1/2 TABLETS BY MOUTH AT BEDTIME 135 tablet 1   rosuvastatin (CRESTOR) 20 MG tablet Take 1 tablet (20 mg total) by mouth at bedtime. 90 tablet 3   No current facility-administered medications for this visit.     Musculoskeletal: Strength & Muscle Tone:  UTA Gait & Station:  Seated Patient leans: N/A  Psychiatric Specialty Exam: Review of Systems  Psychiatric/Behavioral:  Negative for agitation, behavioral problems, confusion, dysphoric mood, hallucinations,  self-injury, sleep disturbance and suicidal ideas. The patient is not nervous/anxious and is not hyperactive.   All other systems reviewed and are negative.  Last menstrual period 11/27/1979.There is no height or weight on file to calculate BMI.  General Appearance: Casual  Eye Contact:  Fair  Speech:  Clear and Coherent  Volume:  Normal  Mood:  Euthymic  Affect:  Congruent  Thought Process:  Goal Directed and Descriptions of Associations: Intact  Orientation:  Full (Time, Place, and Person)  Thought Content: Logical   Suicidal Thoughts:  No  Homicidal Thoughts:  No  Memory:  Immediate;   Fair Recent;   Fair Remote;   Fair  Judgement:  Fair  Insight:  Fair  Psychomotor Activity:  Normal  Concentration:  Concentration: Fair and Attention  Span: Fair  °Recall:  Fair  °Fund of Knowledge: Fair  °Language: Fair  °Akathisia:  No  °Handed:  Right  °AIMS (if indicated): done  °Assets:  Communication Skills °Desire for Improvement °Housing °Intimacy °Social Support  °ADL's:  Intact  °Cognition: WNL  °Sleep:  Fair  ° °Screenings: °AUDIT   ° °Flowsheet Row Admission (Discharged) from 12/17/2019 in [facility] Admission (Discharged[facility]  °Alcohol Use Disorder Identification Test Final Score (AUDIT) 0 0  ° °  ° °GAD-7   ° °Flowsheet Row Video Visit from 01/18/2022 in [facility] Video Visit from 05/17/2021 in [facility] Counselor from 01/14/2020 in [facility] [place]  °Total GAD-7 Score 0 0 5  ° °  ° °Mini-Mental   ° °Flowsheet Row Clinical Support from 02/27/2018 in [facility] [place] Clinical Support from 02/02/2017 in [facility] [place] Clinical Support from 02/03/2016 in [facility] [place]  °Total Score (max 30 points ) 30 30 30  ° °  ° °PHQ2-9   ° °Flowsheet Row Video Visit from 01/18/2022 in  [facility] Office Visit from 11/22/2021 in [facility] [place] Office Visit from 10/18/2021 in [facility] Office Visit from 07/14/2021 in [facility] Video Visit from 05/17/2021 in [facility]  °PHQ-2 Total Score 0 0 0 0 0  °PHQ-9 Total Score -- 0 -- -- 0  ° °  ° °Flowsheet Row Office Visit from 10/18/2021 in [facility] Video Visit from 02/23/2021 in [facility] Admission (Discharged) from 12/17/2019 in [facility]  °C-SSRS RISK CATEGORY No Risk No Risk Low Risk  ° °  ° ° ° °Assessment and Plan: Demetrice D Hayden is a 75-year-old Caucasian female, married, retired, lives in [place], has a history of MDD, anxiety, hyperlipidemia, was evaluated by telemedicine today.  Patient is currently stable.  Plan as noted below. ° °Plan °MDD in remission °Mirtazapine 22.5 mg p.o. nightly ° °GAD-stable °Mirtazapine 22.5 mg p.o. nightly ° °Follow-up in clinic in 3 months or sooner if needed. ° °This note was generated in part or whole with voice recognition software. Voice recognition is usually quite accurate but there are transcription errors that can and very often do occur. I apologize for any typographical errors that were not detected and corrected. ° ° ° ° ° ° , MD °01/19/2022, 6:33 PM ° °

## 2022-01-20 ENCOUNTER — Ambulatory Visit (INDEPENDENT_AMBULATORY_CARE_PROVIDER_SITE_OTHER): Payer: Medicare PPO | Admitting: *Deleted

## 2022-01-20 ENCOUNTER — Other Ambulatory Visit: Payer: Self-pay

## 2022-01-20 DIAGNOSIS — E538 Deficiency of other specified B group vitamins: Secondary | ICD-10-CM

## 2022-01-20 MED ORDER — CYANOCOBALAMIN 1000 MCG/ML IJ SOLN
1000.0000 ug | Freq: Once | INTRAMUSCULAR | Status: AC
  Administered 2022-01-20: 1000 ug via INTRAMUSCULAR

## 2022-01-20 NOTE — Progress Notes (Signed)
Pt arrived for monthly B12 injection, given in L deltoid. Pt tolerated injection well and voiced no concerns.

## 2022-02-09 ENCOUNTER — Encounter: Payer: Self-pay | Admitting: Internal Medicine

## 2022-02-09 ENCOUNTER — Ambulatory Visit
Admission: RE | Admit: 2022-02-09 | Discharge: 2022-02-09 | Disposition: A | Discharge status: Home / Self Care | Payer: Medicare PPO | Source: Ambulatory Visit | Attending: Internal Medicine | Admitting: Internal Medicine

## 2022-02-09 ENCOUNTER — Ambulatory Visit: Payer: Medicare PPO | Admitting: Internal Medicine

## 2022-02-09 ENCOUNTER — Other Ambulatory Visit: Payer: Self-pay

## 2022-02-09 VITALS — BP 160/86 | HR 99 | Temp 98.1°F | Ht 63.0 in | Wt 138.2 lb

## 2022-02-09 DIAGNOSIS — I10 Essential (primary) hypertension: Secondary | ICD-10-CM

## 2022-02-09 DIAGNOSIS — R519 Headache, unspecified: Secondary | ICD-10-CM

## 2022-02-09 DIAGNOSIS — H6122 Impacted cerumen, left ear: Secondary | ICD-10-CM

## 2022-02-09 DIAGNOSIS — R42 Dizziness and giddiness: Secondary | ICD-10-CM | POA: Diagnosis not present

## 2022-02-09 MED ORDER — AMLODIPINE BESYLATE 2.5 MG PO TABS: 2.5000 mg | ORAL_TABLET | Freq: Every day | ORAL | 3 refills | Status: DC

## 2022-02-09 NOTE — Telephone Encounter (Signed)
Called patient back scheduled with MD in office for 1520 increased BP and advised patient to rest until appointment and if symptoms change or worsen to be evaluated at ER,. ?

## 2022-02-09 NOTE — Telephone Encounter (Signed)
Since Saturday every time she gets out of bed in the mornings when she gets out of bed and just feels off balance for a while but gets better and still can continue her gym program each morning, she has noticed when this happens her BP is running higher that normal around 143/84 at 8 am Had patient check BP while on call  BP is currently 173/95 pulse 77,  patient did rise to get cuff so had her sit for a while recheck wrist cuff, patient says she feels completely normal now except slight headache.  Patient takes losartan 25 mg daily in the mornings around 7:30.  In lounge chair resting with arm on pillow  10 minutes after first reading recorded with nurse BP 173/98 pulse 78. Advised patient to rest until nurse could speak with PCP.  I have scheduled tomorrow at 4 and speaking with provider for possible workin. ?

## 2022-02-09 NOTE — Progress Notes (Addendum)
Chief Complaint  ?Patient presents with  ? Hypertension  ? ?F/u  ?1. Htn elevated since Saturday with h/a, dizzines on losartan 25 mg was qhs until 3-4 weeks started to take in am and BP elevated had chips last Thursday  ?Were in losaratan 50 mg per PCP but made you feel dizzy with exercise at the gym  ? ? ?Review of Systems  ?Constitutional:  Negative for weight loss.  ?HENT:  Negative for hearing loss.   ?Eyes:  Negative for blurred vision.  ?Respiratory:  Negative for shortness of breath.   ?Cardiovascular:  Negative for chest pain.  ?Gastrointestinal:  Negative for abdominal pain and blood in stool.  ?Genitourinary:  Negative for dysuria.  ?Musculoskeletal:  Negative for falls and joint pain.  ?Skin:  Negative for rash.  ?Neurological:  Positive for dizziness and headaches.  ?Psychiatric/Behavioral:  Negative for depression.   ?Past Medical History:  ?Diagnosis Date  ? Depression 2000  ? Heart murmur   ? Hypercholesterolemia Osteoporosis  ? Hyperlipemia   ? ?Past Surgical History:  ?Procedure Laterality Date  ? ABDOMINAL HYSTERECTOMY  1980  ? COLONOSCOPY WITH PROPOFOL N/A 03/05/2018  ? Procedure: COLONOSCOPY WITH PROPOFOL;  Surgeon: Toledo, [name], MD;  Location: [facility] ENDOSCOPY;  Service: Endoscopy;  Laterality: N/A;  ? ?Family History  ?Problem Relation Age of Onset  ? Heart disease Mother   ? Diabetes Mother   ? Heart disease Father   ? Diabetes Father   ? Colon cancer Other   ? Congestive Heart Failure Brother   ? Colon cancer Brother   ? Mental illness Paternal Aunt   ? BRCA 1/2 Neg Hx   ? Breast cancer Neg Hx   ? Cowden syndrome Neg Hx   ? DES usage Neg Hx   ? Endometrial cancer Neg Hx   ? Li-Fraumeni syndrome Neg Hx   ? Ovarian cancer Neg Hx   ? ?Social History  ? ?Socioeconomic History  ? Marital status: Married  ?  Spouse name: Herbie [place]  ? Number of children: 2  ? Years of education: Not on file  ? Highest education level: Not on file  ?Occupational History  ? Not on file  ?Tobacco Use  ? Smoking status:  Never  ? Smokeless tobacco: Never  ?Vaping Use  ? Vaping Use: Never used  ?Substance and Sexual Activity  ? Alcohol use: Yes  ?  Alcohol/week: 0.0 standard drinks  ?  Comment: occasionally - socially during the summer a glass of wine or beer  ? Drug use: No  ? Sexual activity: Yes  ?Other Topics Concern  ? Not on file  ?Social History Narrative  ? Not on file  ? ?Social Determinants of Health  ? ?Financial Resource Strain: Not on file  ?Food Insecurity: Not on file  ?Transportation Needs: Not on file  ?Physical Activity: Not on file  ?Stress: Not on file  ?Social Connections: Not on file  ?Intimate Partner Violence: Not on file  ? ?Current Meds  ?Medication Sig  ? amLODipine (NORVASC) 2.5 MG tablet Take 1 tablet (2.5 mg total) by mouth daily. In am  ? calcium carbonate (OS-CAL) 600 MG TABS tablet Take 600 mg by mouth 2 (two) times daily with a meal.  ? Cyanocobalamin (B-12 PO) Take by mouth daily.  ? Glucosamine-Chondroitin 250-200 MG CAPS Take 1 tablet by mouth daily. TAKES 25 MCG  ? losartan (COZAAR) 25 MG tablet Take 1 tablet (25 mg total) by mouth daily.  ? [START ON 03/06/2022]  mirtazapine (REMERON) 15 MG tablet TAKE 1&1/2 TABLETS BY MOUTH AT BEDTIME  ? rosuvastatin (CRESTOR) 20 MG tablet Take 1 tablet (20 mg total) by mouth at bedtime.  ? VITAMIN D PO Take by mouth daily.  ? ?No Known Allergies ?Recent Results (from the past 2160 hour(s))  ?Basic metabolic panel     Status: None  ? Collection Time: 11/18/21  7:56 AM  ?Result Value Ref Range  ? Sodium 140 135 - 145 mEq/L  ? Potassium 4.2 3.5 - 5.1 mEq/L  ? Chloride 104 96 - 112 mEq/L  ? CO2 28 19 - 32 mEq/L  ? Glucose, Bld 95 70 - 99 mg/dL  ? BUN 18 6 - 23 mg/dL  ? Creatinine, Ser 0.87 0.40 - 1.20 mg/dL  ? GFR 66.16 >60.00 mL/min  ?  Comment: Calculated using the CKD-EPI Creatinine Equation (2021)  ? Calcium 10.1 8.4 - 10.5 mg/dL  ?Lipid panel     Status: None  ? Collection Time: 11/18/21  7:56 AM  ?Result Value Ref Range  ? Cholesterol 151 0 - 200 mg/dL  ?   Comment: ATP III Classification       Desirable:  < 200 mg/dL               Borderline High:  200 - 239 mg/dL          High:  > = 240 mg/dL  ? Triglycerides 110.0 0.0 - 149.0 mg/dL  ?  Comment: Normal:  <150 mg/dLBorderline High:  150 - 199 mg/dL  ? HDL 58.40 >39.00 mg/dL  ? VLDL 22.0 0.0 - 40.0 mg/dL  ? LDL Cholesterol 71 0 - 99 mg/dL  ? Total CHOL/HDL Ratio 3   ?  Comment:                Men          Women1/2 Average Risk     3.4          3.3Average Risk          5.0          4.42X Average Risk          9.6          7.13X Average Risk          15.0          11.0                      ? NonHDL 93.07   ?  Comment: NOTE:  Non-HDL goal should be 30 mg/dL higher than patient's LDL goal (i.e. LDL goal of < 70 mg/dL, would have non-HDL goal of < 100 mg/dL)  ?Hemoglobin A1c     Status: None  ? Collection Time: 11/18/21  7:56 AM  ?Result Value Ref Range  ? Hgb A1c MFr Bld 6.2 4.6 - 6.5 %  ?  Comment: Glycemic Control Guidelines for People with Diabetes:Non Diabetic:  <6%Goal of Therapy: <7%Additional Action Suggested:  >8%   ?Hepatic function panel     Status: None  ? Collection Time: 11/18/21  7:56 AM  ?Result Value Ref Range  ? Total Bilirubin 0.5 0.2 - 1.2 mg/dL  ? Bilirubin, Direct 0.1 0.0 - 0.3 mg/dL  ? Alkaline Phosphatase 46 39 - 117 U/L  ? AST 21 0 - 37 U/L  ? ALT 17 0 - 35 U/L  ? Total Protein 6.9 6.0 - 8.3 g/dL  ? Albumin 4.5 3.5 - 5.2 g/dL  ? ?  Objective  ?Body mass index is 24.48 kg/m?. ?Wt Readings from Last 3 Encounters:  ?02/09/22 138 lb 3.2 oz (62.7 kg)  ?11/22/21 137 lb 6.4 oz (62.3 kg)  ?07/14/21 134 lb (60.8 kg)  ? ?Temp Readings from Last 3 Encounters:  ?02/09/22 98.1 ?F (36.7 ?C) (Oral)  ?11/22/21 97.6 ?F (36.4 ?C)  ?07/14/21 (!) 96.5 ?F (35.8 ?C)  ? ?BP Readings from Last 3 Encounters:  ?02/09/22 (!) 160/86  ?11/22/21 124/70  ?07/18/21 132/78  ? ?Pulse Readings from Last 3 Encounters:  ?02/09/22 99  ?11/22/21 72  ?07/14/21 77  ? ? ?Physical Exam ?Vitals and nursing note reviewed.  ?Constitutional:   ?    Appearance: Normal appearance. She is well-developed and well-groomed.  ?HENT:  ?   Head: Normocephalic and atraumatic.  ?   Left Ear: There is impacted cerumen.  ?Eyes:  ?   Conjunctiva/sclera: Conjunctivae normal.  ?   Pupils: Pupils are equal, round, and reactive to light.  ?Cardiovascular:  ?   Rate and Rhythm: Normal rate and regular rhythm.  ?   Heart sounds: Normal heart sounds. No murmur heard. ?Pulmonary:  ?   Effort: Pulmonary effort is normal.  ?   Breath sounds: Normal breath sounds.  ?Abdominal:  ?   General: Abdomen is flat. Bowel sounds are normal.  ?   Tenderness: There is no abdominal tenderness.  ?Musculoskeletal:     ?   General: No tenderness.  ?Skin: ?   General: Skin is warm and dry.  ?Neurological:  ?   General: No focal deficit present.  ?   Mental Status: She is alert and oriented to person, place, and time. Mental status is at baseline.  ?   Cranial Nerves: Cranial nerves 2-12 are intact.  ?   Motor: Motor function is intact.  ?   Coordination: Coordination is intact.  ?   Gait: Gait is intact.  ?Psychiatric:     ?   Attention and Perception: Attention and perception normal.     ?   Mood and Affect: Mood and affect normal.     ?   Speech: Speech normal.     ?   Behavior: Behavior normal. Behavior is cooperative.     ?   Thought Content: Thought content normal.     ?   Cognition and Memory: Cognition and memory normal.     ?   Judgment: Judgment normal.  ? ? ?Assessment  ?Plan  ?Hypertension, unspecified type - Plan: amLODipine (NORVASC) 2.5 MG tablet, CT HEAD WO CONTRAST (5MM) ?Took losartan 25 this am 7 am but for now today take another dose this pm and Friday start again losartan 25 mg at night and noravasc 2.5 mg in am  ? ?Dizziness - Plan: amLODipine (NORVASC) 2.5 MG tablet, CT HEAD WO CONTRAST (5MM) ? ?Intractable headache, unspecified chronicity pattern, unspecified headache type - Plan: CT HEAD WO CONTRAST (5MM)  ? ?Left ear cerumen impaction  ?Debrox at home x 7 days left ear and  warm water and HP day 8  ?CMA visit for lavage 02/17/22 if wax still there ?Provider: Dr. Olivia Mackie McLean-Scocuz[facility]  ?

## 2022-02-09 NOTE — Telephone Encounter (Signed)
If she is having increased blood pressure and feeling off balance, I do feel she needs to go ahead and be seen today.  Question of need for scan, etc.   ?

## 2022-02-09 NOTE — Patient Instructions (Signed)
Debrox ear wax drops to left ear please x 7 days and day 8 warm water and hydrogen peroxide    Earwax Buildup, Adult The ears produce a substance called earwax that helps keep bacteria out of the ear and protects the skin in the ear canal. Occasionally, earwax can build up in the ear and cause discomfort or hearing loss. What are the causes? This condition is caused by a buildup of earwax. Ear canals are self-cleaning. Ear wax is made in the outer part of the ear canal and generally falls out in small amounts over time. When the self-cleaning mechanism is not working, earwax builds up and can cause decreased hearing and discomfort. Attempting to clean ears with cotton swabs can push the earwax deep into the ear canal and cause decreased hearing and pain. What increases the risk? This condition is more likely to develop in people who: Clean their ears often with cotton swabs. Pick at their ears. Use earplugs or in-ear headphones often, or wear hearing aids. The following factors may also make you more likely to develop this condition: Being female. Being of older age. Naturally producing more earwax. Having narrow ear canals. Having earwax that is overly thick or sticky. Having excess hair in the ear canal. Having eczema. Being dehydrated. What are the signs or symptoms? Symptoms of this condition include: Reduced or muffled hearing. A feeling of fullness in the ear or feeling that the ear is plugged. Fluid coming from the ear. Ear pain or an itchy ear. Ringing in the ear. Coughing. Balance problems. An obvious piece of earwax that can be seen inside the ear canal. How is this diagnosed? This condition may be diagnosed based on: Your symptoms. Your medical history. An ear exam. During the exam, your health care provider will look into your ear with an instrument called an otoscope. You may have tests, including a hearing test. How is this treated? This condition may be treated  by: Using ear drops to soften the earwax. Having the earwax removed by a health care provider. The health care provider may: Flush the ear with water. Use an instrument that has a loop on the end (curette). Use a suction device. Having surgery to remove the wax buildup. This may be done in severe cases. Follow these instructions at home:  Take over-the-counter and prescription medicines only as told by your health care provider. Do not put any objects, including cotton swabs, into your ear. You can clean the opening of your ear canal with a washcloth or facial tissue. Follow instructions from your health care provider about cleaning your ears. Do not overclean your ears. Drink enough fluid to keep your urine pale yellow. This will help to thin the earwax. Keep all follow-up visits as told. If earwax builds up in your ears often or if you use hearing aids, consider seeing your health care provider for routine, preventive ear cleanings. Ask your health care provider how often you should schedule your cleanings. If you have hearing aids, clean them according to instructions from the manufacturer and your health care provider. Contact a health care provider if: You have ear pain. You develop a fever. You have pus or other fluid coming from your ear. You have hearing loss. You have ringing in your ears that does not go away. You feel like the room is spinning (vertigo). Your symptoms do not improve with treatment. Get help right away if: You have bleeding from the affected ear. You have severe ear pain.  Summary Earwax can build up in the ear and cause discomfort or hearing loss. The most common symptoms of this condition include reduced or muffled hearing, a feeling of fullness in the ear, or feeling that the ear is plugged. This condition may be diagnosed based on your symptoms, your medical history, and an ear exam. This condition may be treated by using ear drops to soften the earwax or  by having the earwax removed by a health care provider. Do not put any objects, including cotton swabs, into your ear. You can clean the opening of your ear canal with a washcloth or facial tissue. This information is not intended to replace advice given to you by your health care provider. Make sure you discuss any questions you have with your health care provider. Document Revised: 03/16/2020 Document Reviewed: 03/16/2020 Elsevier Patient Education  2022 [employer]  Dizziness Dizziness is a common problem. It is a feeling of unsteadiness or light-headedness. You may feel like you are about to faint. Dizziness can lead to injury if you stumble or fall. Anyone can become dizzy, but dizziness is more common in older adults. This condition can be caused by a number of things, including medicines, dehydration, or illness. Follow these instructions at home: Eating and drinking  Drink enough fluid to keep your urine pale yellow. This helps to keep you from becoming dehydrated. Try to drink more clear fluids, such as water. Do not drink alcohol. Limit your caffeine intake if told to do so by your health care provider. Check ingredients and nutrition facts to see if a food or beverage contains caffeine. Limit your salt (sodium) intake if told to do so by your health care provider. Check ingredients and nutrition facts to see if a food or beverage contains sodium. Activity  Avoid making quick movements. Rise slowly from chairs and steady yourself until you feel okay. In the morning, first sit up on the side of the bed. When you feel okay, stand slowly while you hold onto something until you know that your balance is good. If you need to stand in one place for a long time, move your legs often. Tighten and relax the muscles in your legs while you are standing. Do not drive or use machinery if you feel dizzy. Avoid bending down if you feel dizzy. Place items in your home so that they are easy for you  to reach without leaning over. Lifestyle Do not use any products that contain nicotine or tobacco. These products include cigarettes, chewing tobacco, and vaping devices, such as e-cigarettes. If you need help quitting, ask your health care provider. Try to reduce your stress level by using methods such as yoga or meditation. Talk with your health care provider if you need help to manage your stress. General instructions Watch your dizziness for any changes. Take over-the-counter and prescription medicines only as told by your health care provider. Talk with your health care provider if you think that your dizziness is caused by a medicine that you are taking. Tell a friend or a family member that you are feeling dizzy. If he or she notices any changes in your behavior, have this person call your health care provider. Keep all follow-up visits. This is important. Contact a health care provider if: Your dizziness does not go away or you have new symptoms. Your dizziness or light-headedness gets worse. You feel nauseous. You have reduced hearing. You have a fever. You have neck pain or a stiff neck. Your  dizziness leads to an injury or a fall. Get help right away if: You vomit or have diarrhea and are unable to eat or drink anything. You have problems talking, walking, swallowing, or using your arms, hands, or legs. You feel generally weak. You have any bleeding. You are not thinking clearly or you have trouble forming sentences. It may take a friend or family member to notice this. You have chest pain, abdominal pain, shortness of breath, or sweating. Your vision changes or you develop a severe headache. These symptoms may represent a serious problem that is an emergency. Do not wait to see if the symptoms will go away. Get medical help right away. Call your local emergency services (911 in the [place] Do not drive yourself to the hospital. Summary Dizziness is a feeling of unsteadiness or  light-headedness. This condition can be caused by a number of things, including medicines, dehydration, or illness. Anyone can become dizzy, but dizziness is more common in older adults. Drink enough fluid to keep your urine pale yellow. Do not drink alcohol. Avoid making quick movements if you feel dizzy. Monitor your dizziness for any changes. This information is not intended to replace advice given to you by your health care provider. Make sure you discuss any questions you have with your health care provider. Document Revised: 11/01/2020 Document Reviewed: 11/01/2020 Elsevier Patient Education  2022 [employer].

## 2022-02-10 ENCOUNTER — Ambulatory Visit: Payer: Medicare PPO | Admitting: Internal Medicine

## 2022-02-17 ENCOUNTER — Ambulatory Visit: Payer: Medicare PPO

## 2022-02-17 ENCOUNTER — Ambulatory Visit (INDEPENDENT_AMBULATORY_CARE_PROVIDER_SITE_OTHER): Payer: Medicare PPO | Admitting: *Deleted

## 2022-02-17 ENCOUNTER — Other Ambulatory Visit: Payer: Self-pay

## 2022-02-17 DIAGNOSIS — E538 Deficiency of other specified B group vitamins: Secondary | ICD-10-CM | POA: Diagnosis not present

## 2022-02-17 MED ORDER — CYANOCOBALAMIN 1000 MCG/ML IJ SOLN
1000.0000 ug | Freq: Once | INTRAMUSCULAR | Status: AC
  Administered 2022-02-17: 1000 ug via INTRAMUSCULAR

## 2022-02-17 NOTE — Progress Notes (Signed)
Patient presented for B 12 injection to left deltoid, patient voiced no concerns nor showed any signs of distress during injection. 

## 2022-03-01 ENCOUNTER — Other Ambulatory Visit: Payer: Self-pay | Admitting: Psychiatry

## 2022-03-01 ENCOUNTER — Other Ambulatory Visit: Payer: Self-pay | Admitting: Internal Medicine

## 2022-03-01 DIAGNOSIS — F411 Generalized anxiety disorder: Secondary | ICD-10-CM

## 2022-03-01 DIAGNOSIS — F3342 Major depressive disorder, recurrent, in full remission: Secondary | ICD-10-CM

## 2022-03-08 DIAGNOSIS — L821 Other seborrheic keratosis: Secondary | ICD-10-CM | POA: Diagnosis not present

## 2022-03-08 DIAGNOSIS — D235 Other benign neoplasm of skin of trunk: Secondary | ICD-10-CM | POA: Diagnosis not present

## 2022-03-21 ENCOUNTER — Other Ambulatory Visit (INDEPENDENT_AMBULATORY_CARE_PROVIDER_SITE_OTHER): Payer: Medicare PPO

## 2022-03-21 ENCOUNTER — Ambulatory Visit: Payer: Medicare PPO

## 2022-03-21 DIAGNOSIS — I10 Essential (primary) hypertension: Secondary | ICD-10-CM | POA: Diagnosis not present

## 2022-03-21 DIAGNOSIS — E7800 Pure hypercholesterolemia, unspecified: Secondary | ICD-10-CM | POA: Diagnosis not present

## 2022-03-21 DIAGNOSIS — R739 Hyperglycemia, unspecified: Secondary | ICD-10-CM | POA: Diagnosis not present

## 2022-03-21 LAB — CBC WITH DIFFERENTIAL/PLATELET
Basophils Absolute: 0.1 10*3/uL (ref 0.0–0.1)
Basophils Relative: 1.2 % (ref 0.0–3.0)
Eosinophils Absolute: 0.3 10*3/uL (ref 0.0–0.7)
Eosinophils Relative: 4.1 % (ref 0.0–5.0)
HCT: 39.8 % (ref 36.0–46.0)
Hemoglobin: 13.5 g/dL (ref 12.0–15.0)
Lymphocytes Relative: 27.7 % (ref 12.0–46.0)
Lymphs Abs: 1.8 10*3/uL (ref 0.7–4.0)
MCHC: 34 g/dL (ref 30.0–36.0)
MCV: 88.4 fl (ref 78.0–100.0)
Monocytes Absolute: 0.5 10*3/uL (ref 0.1–1.0)
Monocytes Relative: 8.2 % (ref 3.0–12.0)
Neutro Abs: 3.9 10*3/uL (ref 1.4–7.7)
Neutrophils Relative %: 58.8 % (ref 43.0–77.0)
Platelets: 286 10*3/uL (ref 150.0–400.0)
RBC: 4.5 Mil/uL (ref 3.87–5.11)
RDW: 13 % (ref 11.5–15.5)
WBC: 6.7 10*3/uL (ref 4.0–10.5)

## 2022-03-21 LAB — HEPATIC FUNCTION PANEL
ALT: 19 U/L (ref 0–35)
AST: 19 U/L (ref 0–37)
Albumin: 4.6 g/dL (ref 3.5–5.2)
Alkaline Phosphatase: 48 U/L (ref 39–117)
Bilirubin, Direct: 0.1 mg/dL (ref 0.0–0.3)
Total Bilirubin: 0.6 mg/dL (ref 0.2–1.2)
Total Protein: 7 g/dL (ref 6.0–8.3)

## 2022-03-21 LAB — BASIC METABOLIC PANEL
BUN: 18 mg/dL (ref 6–23)
CO2: 29 mEq/L (ref 19–32)
Calcium: 10 mg/dL (ref 8.4–10.5)
Chloride: 104 mEq/L (ref 96–112)
Creatinine, Ser: 0.83 mg/dL (ref 0.40–1.20)
GFR: 69.84 mL/min (ref >60.00)
Glucose, Bld: 110 mg/dL — ABNORMAL HIGH (ref 70–99)
Potassium: 4.2 mEq/L (ref 3.5–5.1)
Sodium: 141 mEq/L (ref 135–145)

## 2022-03-21 LAB — TSH: TSH: 4.46 u[IU]/mL (ref 0.35–5.50)

## 2022-03-21 LAB — LIPID PANEL
Cholesterol: 152 mg/dL (ref 0–200)
HDL: 57.3 mg/dL (ref >39.00)
LDL Cholesterol: 67 mg/dL (ref 0–99)
NonHDL: 94.55
Total CHOL/HDL Ratio: 3
Triglycerides: 136 mg/dL (ref 0.0–149.0)
VLDL: 27.2 mg/dL (ref 0.0–40.0)

## 2022-03-21 LAB — HEMOGLOBIN A1C: Hgb A1c MFr Bld: 6.3 % (ref 4.6–6.5)

## 2022-03-23 ENCOUNTER — Encounter: Payer: Self-pay | Admitting: Internal Medicine

## 2022-03-23 ENCOUNTER — Ambulatory Visit: Payer: Medicare PPO | Admitting: Internal Medicine

## 2022-03-23 VITALS — BP 122/76 | HR 78 | Temp 98.6°F | Resp 14 | Ht 63.0 in | Wt 136.2 lb

## 2022-03-23 DIAGNOSIS — I10 Essential (primary) hypertension: Secondary | ICD-10-CM | POA: Diagnosis not present

## 2022-03-23 DIAGNOSIS — E7800 Pure hypercholesterolemia, unspecified: Secondary | ICD-10-CM | POA: Diagnosis not present

## 2022-03-23 DIAGNOSIS — Z1231 Encounter for screening mammogram for malignant neoplasm of breast: Secondary | ICD-10-CM | POA: Diagnosis not present

## 2022-03-23 DIAGNOSIS — R42 Dizziness and giddiness: Secondary | ICD-10-CM | POA: Diagnosis not present

## 2022-03-23 DIAGNOSIS — R739 Hyperglycemia, unspecified: Secondary | ICD-10-CM

## 2022-03-23 DIAGNOSIS — F320 Major depressive disorder, single episode, mild: Secondary | ICD-10-CM

## 2022-03-23 DIAGNOSIS — E538 Deficiency of other specified B group vitamins: Secondary | ICD-10-CM

## 2022-03-23 MED ORDER — CYANOCOBALAMIN 1000 MCG/ML IJ SOLN
1000.0000 ug | Freq: Once | INTRAMUSCULAR | Status: AC
  Administered 2022-03-23: 1000 ug via INTRAMUSCULAR

## 2022-03-23 MED ORDER — LOSARTAN POTASSIUM 25 MG PO TABS: 25.0000 mg | ORAL_TABLET | Freq: Every day | ORAL | 1 refills | Status: DC

## 2022-03-23 NOTE — Progress Notes (Signed)
Patient ID: Angelica Robertson, female   DOB: Dec 09, 1946, 76 y.o.   MRN: [id number] ? ? ?Subjective:  ? ? Patient ID: Angelica Robertson, female    DOB: 20-Sep-1946, 76 y.o.   MRN: [id number] ? ?This visit occurred during the SARS-CoV-2 public health emergency.  Safety protocols were in place, including screening questions prior to the visit, additional usage of staff PPE, and extensive cleaning of exam room while observing appropriate contact time as indicated for disinfecting solutions.  ? ?Patient here for a scheduled follow up.  ? ?Chief Complaint  ?Patient presents with  ? Follow-up  ?  6 mo follow up HTN   ? Hypertension  ? Hyperlipidemia  ? Depression  ? Hyperglycemia  ? .  ? ?HPI ?Recently evaluated for dizziness and elevated blood pressure.  Found to have cerumen impaction.  Ears irrigated.  No dizziness since.  No headache.  Was placed on low dose amlodipine 2.27m - blood pressure doing better.  Reviewed outside blood pressure readings - most ranging 120s/70s.  No chest pain or sob reported.  No abdominal pain or bowel change reported.  Going to the gym and exercising 5 days per week.   ? ? ?Past Medical History:  ?Diagnosis Date  ? Depression 2000  ? Heart murmur   ? Hypercholesterolemia Osteoporosis  ? Hyperlipemia   ? ?Past Surgical History:  ?Procedure Laterality Date  ? ABDOMINAL HYSTERECTOMY  1980  ? COLONOSCOPY WITH PROPOFOL N/A 03/05/2018  ? Procedure: COLONOSCOPY WITH PROPOFOL;  Surgeon: Toledo, TBenay Pike MD;  Location: [facility] ENDOSCOPY;  Service: Endoscopy;  Laterality: N/A;  ? ?Family History  ?Problem Relation Age of Onset  ? Heart disease Mother   ? Diabetes Mother   ? Heart disease Father   ? Diabetes Father   ? Colon cancer Other   ? Congestive Heart Failure Brother   ? Colon cancer Brother   ? Mental illness Paternal Aunt   ? BRCA 1/2 Neg Hx   ? Breast cancer Neg Hx   ? Cowden syndrome Neg Hx   ? DES usage Neg Hx   ? Endometrial cancer Neg Hx   ? Li-Fraumeni syndrome Neg Hx   ? Ovarian cancer Neg Hx    ? ?Social History  ? ?Socioeconomic History  ? Marital status: Married  ?  Spouse name: rHerbie [place] ? Number of children: 2  ? Years of education: Not on file  ? Highest education level: Not on file  ?Occupational History  ? Not on file  ?Tobacco Use  ? Smoking status: Never  ? Smokeless tobacco: Never  ?Vaping Use  ? Vaping Use: Never used  ?Substance and Sexual Activity  ? Alcohol use: Yes  ?  Alcohol/week: 0.0 standard drinks  ?  Comment: occasionally - socially during the summer a glass of wine or beer  ? Drug use: No  ? Sexual activity: Yes  ?Other Topics Concern  ? Not on file  ?Social History Narrative  ? Not on file  ? ?Social Determinants of Health  ? ?Financial Resource Strain: Not on file  ?Food Insecurity: Not on file  ?Transportation Needs: Not on file  ?Physical Activity: Not on file  ?Stress: Not on file  ?Social Connections: Not on file  ? ? ? ?Review of Systems  ?Constitutional:  Negative for appetite change and unexpected weight change.  ?HENT:  Negative for congestion and sinus pressure.   ?Respiratory:  Negative for cough, chest tightness and shortness of breath.   ?Cardiovascular:  Negative for chest pain, palpitations and leg swelling.  ?Gastrointestinal:  Negative for abdominal pain, diarrhea, nausea and vomiting.  ?Genitourinary:  Negative for difficulty urinating and dysuria.  ?Musculoskeletal:  Negative for joint swelling and myalgias.  ?Skin:  Negative for color change and rash.  ?Neurological:  Negative for dizziness, light-headedness and headaches.  ?Psychiatric/Behavioral:  Negative for agitation and dysphoric mood.   ? ?   ?Objective:  ?  ? ?BP 122/76 (BP Location: Left Arm, Patient Position: Sitting, Cuff Size: Small)   Pulse 78   Temp 98.6 ?F (37 ?C) (Temporal)   Resp 14   Ht 5' 3"  (1.6 m)   Wt 136 lb 3.2 oz (61.8 kg)   LMP 11/27/1979   SpO2 99%   BMI 24.13 kg/m?  ?Wt Readings from Last 3 Encounters:  ?03/23/22 136 lb 3.2 oz (61.8 kg)  ?02/09/22 138 lb 3.2 oz (62.7 kg)   ?11/22/21 137 lb 6.4 oz (62.3 kg)  ? ? ?Physical Exam ?Vitals reviewed.  ?Constitutional:   ?   General: She is not in acute distress. ?   Appearance: Normal appearance.  ?HENT:  ?   Head: Normocephalic and atraumatic.  ?   Right Ear: External ear normal.  ?   Left Ear: External ear normal.  ?Eyes:  ?   General: No scleral icterus.    ?   Right eye: No discharge.     ?   Left eye: No discharge.  ?   Conjunctiva/sclera: Conjunctivae normal.  ?Neck:  ?   Thyroid: No thyromegaly.  ?Cardiovascular:  ?   Rate and Rhythm: Normal rate and regular rhythm.  ?Pulmonary:  ?   Effort: No respiratory distress.  ?   Breath sounds: Normal breath sounds. No wheezing.  ?Abdominal:  ?   General: Bowel sounds are normal.  ?   Palpations: Abdomen is soft.  ?   Tenderness: There is no abdominal tenderness.  ?Musculoskeletal:     ?   General: No swelling or tenderness.  ?   Cervical back: Neck supple. No tenderness.  ?Lymphadenopathy:  ?   Cervical: No cervical adenopathy.  ?Skin: ?   Findings: No erythema or rash.  ?Neurological:  ?   Mental Status: She is alert.  ?Psychiatric:     ?   Mood and Affect: Mood normal.     ?   Behavior: Behavior normal.  ? ? ? ?Outpatient Encounter Medications as of 03/23/2022  ?Medication Sig  ? amLODipine (NORVASC) 2.5 MG tablet Take 1 tablet (2.5 mg total) by mouth daily. In am  ? calcium carbonate (OS-CAL) 600 MG TABS tablet Take 600 mg by mouth 2 (two) times daily with a meal.  ? Cyanocobalamin (B-12 PO) Take by mouth daily.  ? Glucosamine-Chondroitin 250-200 MG CAPS Take 1 tablet by mouth daily. TAKES 25 MCG  ? mirtazapine (REMERON) 15 MG tablet TAKE 1&1/2 TABLETS BY MOUTH AT BEDTIME  ? rosuvastatin (CRESTOR) 20 MG tablet Take 1 tablet (20 mg total) by mouth at bedtime.  ? VITAMIN D PO Take by mouth daily.  ? [DISCONTINUED] losartan (COZAAR) 25 MG tablet TAKE 1 TABLET BY MOUTH ONCE DAILY  ? losartan (COZAAR) 25 MG tablet Take 1 tablet (25 mg total) by mouth daily.  ? [EXPIRED] cyanocobalamin ((VITAMIN  B-12)) injection 1,000 mcg   ? ?No facility-administered encounter medications on file as of 03/23/2022.  ?  ? ?Lab Results  ?Component Value Date  ? WBC 6.7 03/21/2022  ? HGB 13.5 03/21/2022  ?  HCT 39.8 03/21/2022  ? PLT 286.0 03/21/2022  ? GLUCOSE 110 (H) 03/21/2022  ? CHOL 152 03/21/2022  ? TRIG 136.0 03/21/2022  ? HDL 57.30 03/21/2022  ? [place] 67 03/21/2022  ? ALT 19 03/21/2022  ? AST 19 03/21/2022  ? NA 141 03/21/2022  ? K 4.2 03/21/2022  ? CL 104 03/21/2022  ? CREATININE 0.83 03/21/2022  ? BUN 18 03/21/2022  ? CO2 29 03/21/2022  ? TSH 4.46 03/21/2022  ? HGBA1C 6.3 03/21/2022  ? ? ?CT HEAD WO CONTRAST (5MM) ? ?Result Date: 02/09/2022 ?CLINICAL DATA:  Dizziness EXAM: CT HEAD WITHOUT CONTRAST TECHNIQUE: Contiguous axial images were obtained from the base of the skull through the vertex without intravenous contrast. RADIATION DOSE REDUCTION: This exam was performed according to the departmental dose-optimization program which includes automated exposure control, adjustment of the mA and/or kV according to patient size and/or use of iterative reconstruction technique. COMPARISON:  None. FINDINGS: Brain: No evidence of acute infarction, hemorrhage, hydrocephalus, extra-axial collection or mass lesion/mass effect. Vascular: No hyperdense vessel or unexpected calcification. Skull: Normal. Negative for fracture or focal lesion. Sinuses/Orbits: No acute finding. Other: None. IMPRESSION: No acute intracranial abnormality. Electronically Signed   By: Yetta Glassman M.D.   On: 02/09/2022 17:23  ? ? ?   ?Assessment & Plan:  ? ?Problem List Items Addressed This Visit   ? ? B12 deficiency  ? Depression, major, single episode, mild ([place])  ?  Doing much better.  Followed by psychiatry.  Continues on remeron.  Follow.   ? ?  ?  ? Dizziness  ?  Resolved after ear irrigation.  Follow.   ? ?  ?  ? Hypercholesterolemia  ?  On crestor.  Low cholesterol diet and exercise.  Follow lipid panel and liver function tests.   ? ?  ?  ?  Relevant Medications  ? losartan (COZAAR) 25 MG tablet  ? Other Relevant Orders  ? Hepatic function panel  ? Lipid Profile  ? Hyperglycemia  ?  Low carb diet and exercise.  Follow met b and a1c.  ? ?  ?  ? Relevant Orders  ? HgB

## 2022-03-26 ENCOUNTER — Encounter: Payer: Self-pay | Admitting: Internal Medicine

## 2022-03-26 DIAGNOSIS — R42 Dizziness and giddiness: Secondary | ICD-10-CM | POA: Insufficient documentation

## 2022-03-26 NOTE — Assessment & Plan Note (Signed)
Low carb diet and exercise.  Follow met b and a1c.  ?

## 2022-03-26 NOTE — Assessment & Plan Note (Signed)
On crestor.  Low cholesterol diet and exercise.  Follow lipid panel and liver function tests.   

## 2022-03-26 NOTE — Assessment & Plan Note (Signed)
Reviewed outside blood pressure readings - averaging 120s/70s.   Continue losartan 25mg  q day and low dose amlodipine.  Follow pressures.  Follow metabolic panel.  ?

## 2022-03-26 NOTE — Assessment & Plan Note (Signed)
Doing much better.  Followed by psychiatry.  Continues on remeron.  Follow.   °

## 2022-03-26 NOTE — Assessment & Plan Note (Signed)
Resolved after ear irrigation.  Follow.   ?

## 2022-04-05 ENCOUNTER — Other Ambulatory Visit: Payer: Self-pay | Admitting: Internal Medicine

## 2022-04-19 ENCOUNTER — Encounter: Payer: Self-pay | Admitting: Internal Medicine

## 2022-04-20 ENCOUNTER — Ambulatory Visit: Payer: Medicare PPO

## 2022-04-20 NOTE — Telephone Encounter (Signed)
Blood pressures do look good.  I am ok with adjusting her medication.  I am not sure which one she takes in the am, but I would recommend continuing losartan and stopping amlodipine.  Monitor pressures and send in readings.  Make sure has f/u appt scheduled ?

## 2022-04-24 ENCOUNTER — Ambulatory Visit (INDEPENDENT_AMBULATORY_CARE_PROVIDER_SITE_OTHER): Payer: Medicare PPO | Admitting: Psychiatry

## 2022-04-24 ENCOUNTER — Encounter: Payer: Self-pay | Admitting: Psychiatry

## 2022-04-24 VITALS — BP 146/74 | HR 103 | Temp 98.0°F | Wt 134.6 lb

## 2022-04-24 DIAGNOSIS — F331 Major depressive disorder, recurrent, moderate: Secondary | ICD-10-CM | POA: Diagnosis not present

## 2022-04-24 DIAGNOSIS — F411 Generalized anxiety disorder: Secondary | ICD-10-CM

## 2022-04-24 MED ORDER — DULOXETINE HCL 20 MG PO CPEP: 40.0000 mg | ORAL_CAPSULE | Freq: Every day | ORAL | 1 refills | Status: DC

## 2022-04-24 NOTE — Progress Notes (Signed)
[place] MD OP Progress Note ? ?04/24/2022 5:18 PM ?Angelica Robertson  ?MRN:  [id number] ? ?Chief Complaint:  ?Chief Complaint  ?Patient presents with  ? Follow-up: 76 year old Caucasian female with history of MDD, GAD, hyperlipidemia, presented for medication management, with worsening mood symptoms.  ? ?HPI: Angelica Robertson is a 76 year old Caucasian female, married, retired, lives in [place], has a history of MDD, GAD, hyperlipidemia, arthritis was evaluated in office today. ? ?Patient today reports she has had worsening mood symptoms since the past few days.  Patient reports fatigue and low energy when she goes to the gym to work out.  Patient also reports lack of interest in doing the things that she normally enjoyed in the past, inability to focus on reading books as well as sleep problems.  Patient also reports worrying and feels nervous or anxious. ? ?Patient does reports psychosocial stressors of recently having to make some changes to her Will.  Patient reports her Will was changed so that her daughter whom she has not seen in the past 5 years will not be included in it.  She does not know if this could be a stressor which could be affecting her mood.  Patient also reports recent changes to her medications, was started on amlodipine 2.5 mg which she takes during the day for her high blood pressure. ? ?As per collateral information obtained from spouse he has also observed that she has been decompensating. ? ?Patient currently compliant on the mirtazapine. ? ?Denies suicidality, homicidality or perceptual disturbances. ? ?Patient denies any other concerns today. ? ?Visit Diagnosis:  ?  ICD-10-CM   ?1. MDD (major depressive disorder), recurrent episode, moderate (HCC)  F33.1 DULoxetine (CYMBALTA) 20 MG capsule  ?  ?2. GAD (generalized anxiety disorder)  F41.1 DULoxetine (CYMBALTA) 20 MG capsule  ?  ? ? ?Past Psychiatric History: Reviewed past psychiatric history from progress note on 01/12/2020. ? ?Past Medical History:  ?Past  Medical History:  ?Diagnosis Date  ? Depression 2000  ? Heart murmur   ? Hypercholesterolemia Osteoporosis  ? Hyperlipemia   ?  ?Past Surgical History:  ?Procedure Laterality Date  ? ABDOMINAL HYSTERECTOMY  1980  ? COLONOSCOPY WITH PROPOFOL N/A 03/05/2018  ? Procedure: COLONOSCOPY WITH PROPOFOL;  Surgeon: Toledo, [name], MD;  Location: [facility] ENDOSCOPY;  Service: Endoscopy;  Laterality: N/A;  ? ? ?Family Psychiatric History: Reviewed family psychiatric history from progress note on 01/12/2020. ? ?Family History:  ?Family History  ?Problem Relation Age of Onset  ? Heart disease Mother   ? Diabetes Mother   ? Heart disease Father   ? Diabetes Father   ? Colon cancer Other   ? Congestive Heart Failure Brother   ? Colon cancer Brother   ? Mental illness Paternal Aunt   ? BRCA 1/2 Neg Hx   ? Breast cancer Neg Hx   ? Cowden syndrome Neg Hx   ? DES usage Neg Hx   ? Endometrial cancer Neg Hx   ? Li-Fraumeni syndrome Neg Hx   ? Ovarian cancer Neg Hx   ? ? ?Social History: Reviewed social history from progress note on 01/12/2020. ?Social History  ? ?Socioeconomic History  ? Marital status: Married  ?  Spouse name: Herbie [place]  ? Number of children: 2  ? Years of education: Not on file  ? Highest education level: Not on file  ?Occupational History  ? Not on file  ?Tobacco Use  ? Smoking status: Never  ? Smokeless tobacco: Never  ?Vaping Use  ?  Vaping Use: Never used  ?Substance and Sexual Activity  ? Alcohol use: Yes  ?  Alcohol/week: 0.0 standard drinks  ?  Comment: occasionally - socially during the summer a glass of wine or beer  ? Drug use: No  ? Sexual activity: Yes  ?Other Topics Concern  ? Not on file  ?Social History Narrative  ? Not on file  ? ?Social Determinants of Health  ? ?Financial Resource Strain: Not on file  ?Food Insecurity: Not on file  ?Transportation Needs: Not on file  ?Physical Activity: Not on file  ?Stress: Not on file  ?Social Connections: Not on file  ? ? ?Allergies: No Known Allergies ? ?Metabolic Disorder  Labs: ?Lab Results  ?Component Value Date  ? HGBA1C 6.3 03/21/2022  ? ?No results found for: PROLACTIN ?Lab Results  ?Component Value Date  ? CHOL 152 03/21/2022  ? TRIG 136.0 03/21/2022  ? HDL 57.30 03/21/2022  ? CHOLHDL 3 03/21/2022  ? VLDL 27.2 03/21/2022  ? [place] 67 03/21/2022  ? [place] 71 11/18/2021  ? ?Lab Results  ?Component Value Date  ? TSH 4.46 03/21/2022  ? TSH 3.32 11/29/2020  ? ? ?Therapeutic Level Labs: ?No results found for: LITHIUM ?No results found for: VALPROATE ?No components found for:  CBMZ ? ?Current Medications: ?Current Outpatient Medications  ?Medication Sig Dispense Refill  ? amLODipine (NORVASC) 2.5 MG tablet Take 1 tablet (2.5 mg total) by mouth daily. In am 90 tablet 3  ? calcium carbonate (OS-CAL) 600 MG TABS tablet Take 600 mg by mouth 2 (two) times daily with a meal.    ? Cyanocobalamin (B-12 PO) Take by mouth daily.    ? DULoxetine (CYMBALTA) 20 MG capsule Take 2 capsules (40 mg total) by mouth daily. 60 capsule 1  ? Glucosamine-Chondroitin 250-200 MG CAPS Take 1 tablet by mouth daily. TAKES 25 MCG    ? losartan (COZAAR) 25 MG tablet Take 1 tablet (25 mg total) by mouth daily. 90 tablet 1  ? mirtazapine (REMERON) 15 MG tablet TAKE 1&1/2 TABLETS BY MOUTH AT BEDTIME 135 tablet 1  ? rosuvastatin (CRESTOR) 20 MG tablet TAKE 1 TABLET BY MOUTH AT BEDTIME 90 tablet 3  ? VITAMIN D PO Take by mouth daily.    ? ?No current facility-administered medications for this visit.  ? ? ? ?Musculoskeletal: ?Strength & Muscle Tone: within normal limits ?Gait & Station: normal ?Patient leans: N/A ? ?Psychiatric Specialty Exam: ?Review of Systems  ?Constitutional:  Positive for fatigue.  ?Psychiatric/Behavioral:  Positive for decreased concentration, dysphoric mood and sleep disturbance. The patient is nervous/anxious.   ?All other systems reviewed and are negative.  ?Blood pressure (!) 146/74, pulse (!) 103, temperature 98 ?F (36.7 ?C), temperature source Temporal, weight 134 lb 9.6 oz (61.1 kg), last  menstrual period 11/27/1979.Body mass index is 23.84 kg/m?.  ?General Appearance: Casual  ?Eye Contact:  Fair  ?Speech:  Clear and Coherent  ?Volume:  Normal  ?Mood:  Anxious and Depressed  ?Affect:  Congruent  ?Thought Process:  Goal Directed and Descriptions of Associations: Intact  ?Orientation:  Full (Time, Place, and Person)  ?Thought Content: Logical   ?Suicidal Thoughts:  No  ?Homicidal Thoughts:  No  ?Memory:  Immediate;   Fair ?Recent;   Fair ?Remote;   Fair  ?Judgement:  Fair  ?Insight:  Fair  ?Psychomotor Activity:  Normal  ?Concentration:  Concentration: Fair and Attention Span: Fair  ?Recall:  Fair  ?Fund of Knowledge: Fair  ?Language: Fair  ?Akathisia:  No  ?  Handed:  Right  ?AIMS (if indicated): done  ?Assets:  Communication Skills ?Desire for Improvement ?Housing ?Intimacy ?Social Support  ?ADL's:  Intact  ?Cognition: WNL  ?Sleep:  Poor  ? ?Screenings: ?AIMS   ? ?[place] Office Visit from 04/24/2022 in [facility]  ?AIMS Total Score 0  ? ?  ? ?AUDIT   ? ?Flowsheet Row Admission (Discharged) from 12/17/2019 in [facility] Admission (Discharged) from OP Visit from 12/09/2012 in [facility]  ?Alcohol Use Disorder Identification Test Final Score (AUDIT) 0 0  ? ?  ? ?GAD-7   ? ?Flowsheet Row Video Visit from 01/18/2022 in [facility] Video Visit from 05/17/2021 in [facility] Counselor from 01/14/2020 in [facility]  ?Total GAD-7 Score 0 0 5  ? ?  ? ?Mini-Mental   ? ?Flowsheet Row Clinical Support from 02/27/2018 in [facility] from 02/02/2017 in [facility] from 02/03/2016 in [facility]  ?Total Score (max 30 points ) 30 30 30   ? ?  ? ?PHQ2-9   ? ?[facility] Office Visit from 04/24/2022 in [facility] Office  Visit from 03/23/2022 in [facility] Video Visit from 01/18/2022 in [place] Office Visit from 11/22/2021 in [facility] Office Visit

## 2022-04-24 NOTE — Patient Instructions (Signed)
?Repetitive Transcranial Magnetic Stimulation ?Repetitive transcranial magnetic stimulation (rTMS) is an effective type of brain stimulation therapy that is used to treat major depression or other mental health conditions. You may have this procedure if your depression has not responded to medicine and other treatments. ?In rTMS, an insulated magnetic coil is held near your head. It delivers short electromagnetic pulses to specific areas of the brain that help to control mood. These electric currents stimulate nerve cells in the targeted area. You may have multiple treatments of rTMS. You will not feel the electric current or feel pain. ?Tell a health care provider about: ?Any allergies you have. ?All medicines you are taking, including vitamins, herbs, eye drops, creams, and over-the-counter medicines. ?Any bleeding problems you have. ?Any surgeries you have had. ?Any medical conditions you have. ?Whether you are pregnant or may be pregnant. ?Any metal you have in your body, such as a cochlear implant, spinal cord stimulator, pacemaker, stents, plates, or screws. ?What are the risks? ?Rare side effects include: ?Seizures. These are more common in people who have epilepsy, have a history of seizures, or are already taking certain medicines that can trigger seizures. ?Experiencing periods of elation or irritability and high energy (mania). This is more common in people who have bipolar disorder. ?Hearing loss. This can happen if you do not have good ear protection during treatment. ?What happens before the procedure? ?Ask your health care provider about: ?Changing or stopping your regular medicines. This is especially important if you are taking diabetes medicines or blood thinners. ?Taking over-the-counter medicines, vitamins, herbs, and supplements. ?What happens during the procedure? ? ?You will be seated in a reclining chair. ?You will be given earplugs to wear. ?Your health care provider will use a machine to  place an electromagnetic coil against your head. ?Your health care provider will use a computer program to send electromagnetic pulses through the coil to specific areas of your brain. ?You may hear clicking sounds and feel tapping on your head. ?Your health care provider may adjust or move the coil during your treatment session. ?The procedure may vary among health care providers and hospitals. ?What can I expect after the procedure? ?After the procedure, it is common to have: ?A headache. ?Light-headedness. ?Mild discomfort, twitching, or tingling in your scalp. ?Discomfort in your jaw or cheek on the side of your head where you had the procedure. ?Follow these instructions at home: ?Take over-the-counter and prescription medicines only as told by your health care provider. ?Watch for any symptoms of depression. ?Keep all follow-up visits. This is important. ?Contact a health care provider if: ?Your headache and scalp discomfort continue after your treatment series is done. ?You have hearing loss or difficulty hearing. ?Your depression continues or gets worse. ?Get help right away if: ?You experience a period of elation or irritability and high energy (mania). ?You have serious thoughts about hurting yourself or others. ?If you ever feel like you may hurt yourself or others, or have thoughts about taking your own life, get help right away. Go to your nearest emergency department or: ?Call your local emergency services (911 in the [place] ?Call a suicide crisis helpline, such as the [facility] at (551) 467-8705 or 988 in the [place]. This is open 24 hours a day in the [place] ?Text the Crisis Text Line at 515-427-8193 (in the [place].). ?Summary ?Repetitive transcranial magnetic stimulation (rTMS) is an effective type of brain stimulation therapy that is used to treat major depression or other mental health  conditions. ?You may have this procedure if your depression has not responded to medicine and other  treatments. ?In rTMS, an insulated magnetic coil is held near your head. It delivers short electromagnetic pulses to nerve cells in targeted areas. ?After the procedure, it is common to have a headache, light-headedness, and mild discomfort in your scalp or jaw. ?This information is not intended to replace advice given to you by your health care provider. Make sure you discuss any questions you have with your health care provider. ?Document Revised: 06/22/2021 Document Reviewed: 03/16/2021 ?Elsevier Patient Education ? [place]. ? ?www.openpathcollective.org ? ?www.psychologytoday ? ?

## 2022-04-25 ENCOUNTER — Ambulatory Visit (INDEPENDENT_AMBULATORY_CARE_PROVIDER_SITE_OTHER): Payer: Medicare PPO | Admitting: *Deleted

## 2022-04-25 DIAGNOSIS — E538 Deficiency of other specified B group vitamins: Secondary | ICD-10-CM | POA: Diagnosis not present

## 2022-04-25 MED ORDER — CYANOCOBALAMIN 1000 MCG/ML IJ SOLN
1000.0000 ug | Freq: Once | INTRAMUSCULAR | Status: AC
  Administered 2022-04-25: 1000 ug via INTRAMUSCULAR

## 2022-04-25 NOTE — Progress Notes (Signed)
Patient presented for B 12 injection to right deltoid, patient voiced no concerns nor showed any signs of distress during injection. 

## 2022-05-02 ENCOUNTER — Telehealth: Payer: Self-pay

## 2022-05-02 DIAGNOSIS — F411 Generalized anxiety disorder: Secondary | ICD-10-CM

## 2022-05-02 DIAGNOSIS — F331 Major depressive disorder, recurrent, moderate: Secondary | ICD-10-CM

## 2022-05-02 MED ORDER — DULOXETINE HCL 20 MG PO CPEP: 20.0000 mg | ORAL_CAPSULE | Freq: Every day | ORAL | 1 refills | Status: DC

## 2022-05-02 NOTE — Telephone Encounter (Signed)
Returned call to patient, patient reports she has been having headaches as well as her blood pressure has been high since being on the Cymbalta.  Discussed to reduce the Cymbalta to just 1 cap-20 mg daily and stop the 40 mg daily.  Patient denies any other concerns today.  Patient has upcoming appointment with writer next week.

## 2022-05-02 NOTE — Telephone Encounter (Signed)
called pt to get more info she states that she takes cymbalta at around 4:30 and when she wakes up she has a headache, and her feet feel hot hot and she just doesn't feel right.

## 2022-05-02 NOTE — Telephone Encounter (Signed)
pt left message that she having issues with the cymbalta

## 2022-05-10 ENCOUNTER — Telehealth (INDEPENDENT_AMBULATORY_CARE_PROVIDER_SITE_OTHER): Payer: Medicare PPO | Admitting: Psychiatry

## 2022-05-10 ENCOUNTER — Encounter: Payer: Self-pay | Admitting: Psychiatry

## 2022-05-10 DIAGNOSIS — F331 Major depressive disorder, recurrent, moderate: Secondary | ICD-10-CM

## 2022-05-10 DIAGNOSIS — F411 Generalized anxiety disorder: Secondary | ICD-10-CM

## 2022-05-10 NOTE — Progress Notes (Unsigned)
Virtual Visit via Video Note  I connected with KATRIN GRABEL on 05/10/22 at  2:00 PM EDT by a video enabled telemedicine application and verified that I am speaking with the correct person using two identifiers.  Location Provider Location : [facility] Patient Location : Home  Participants: Patient , Provider   I discussed the limitations of evaluation and management by telemedicine and the availability of in person appointments. The patient expressed understanding and agreed to proceed.   I discussed the assessment and treatment plan with the patient. The patient was provided an opportunity to ask questions and all were answered. The patient agreed with the plan and demonstrated an understanding of the instructions.   The patient was advised to call back or seek an in-person evaluation if the symptoms worsen or if the condition fails to improve as anticipated.   [place] MD OP Progress Note  05/10/2022 3:11 PM ASMARA BACKS  MRN:  [id number]  Chief Complaint:  Chief Complaint  Patient presents with   Follow-up: 76 year old Caucasian female with history of MDD, GAD, recent medication readjustment for worsening mood symptoms presented for medication management.   HPI: Angelica Robertson is a 76 year old Caucasian female, married, retired, lives in [place], has a history of MDD, GAD, hyperlipidemia, arthritis was evaluated by telemedicine today.  Patient today reports she has noticed some improvement in her mood-anxiety and depressive symptoms since being on the Cymbalta.  She is currently taking the 20 mg as she did not tolerate the 40 mg of the Cymbalta.  Although she is improving she continues to have episodes of anhedonia especially when she wakes up in the morning.  Has to push herself to go to the gym although she is doing it on a regular basis.  Does struggle with appetite, has been forcing herself to eat.  Patient reports she sleeps around 7-1/2 hours.  She continues to take the  mirtazapine.  Patient denies any suicidality, homicidality or perceptual disturbances.  Has not been able to find a psychotherapist yet.  However agreeable to get established.  Patient is interested in referral for transcranial magnetic stimulation.  Will refer her for the same.  Denies any other concerns today.  Visit Diagnosis:    ICD-10-CM   1. MDD (major depressive disorder), recurrent episode, moderate (HCC)  F33.1     2. GAD (generalized anxiety disorder)  F41.1       Past Psychiatric History: Reviewed past psychiatric history from progress note on 01/12/2020.  Past Medical History:  Past Medical History:  Diagnosis Date   Depression 2000   Heart murmur    Hypercholesterolemia Osteoporosis   Hyperlipemia     Past Surgical History:  Procedure Laterality Date   ABDOMINAL HYSTERECTOMY  1980   COLONOSCOPY WITH PROPOFOL N/A 03/05/2018   Procedure: COLONOSCOPY WITH PROPOFOL;  Surgeon: Toledo, [name], MD;  Location: [facility] ENDOSCOPY;  Service: Endoscopy;  Laterality: N/A;    Family Psychiatric History: Reviewed family psychiatric history from progress note on 01/12/2020.  Family History:  Family History  Problem Relation Age of Onset   Heart disease Mother    Diabetes Mother    Heart disease Father    Diabetes Father    Colon cancer Other    Congestive Heart Failure Brother    Colon cancer Brother    Mental illness Paternal Aunt    BRCA 1/2 Neg Hx    Breast cancer Neg Hx    Cowden syndrome Neg Hx    DES usage Neg  Hx    Endometrial cancer Neg Hx    Li-Fraumeni syndrome Neg Hx    Ovarian cancer Neg Hx     Social History: Reviewed social history from progress note on 01/12/2020. Social History   Socioeconomic History   Marital status: Married    Spouse name: robert   Number of children: 2   Years of education: Not on file   Highest education level: Not on file  Occupational History   Not on file  Tobacco Use   Smoking status: Never   Smokeless tobacco:  Never  Vaping Use   Vaping Use: Never used  Substance and Sexual Activity   Alcohol use: Yes    Alcohol/week: 0.0 standard drinks    Comment: occasionally - socially during the summer a glass of wine or beer   Drug use: No   Sexual activity: Yes  Other Topics Concern   Not on file  Social History Narrative   Not on file   Social Determinants of Health   Financial Resource Strain: Not on file  Food Insecurity: Not on file  Transportation Needs: Not on file  Physical Activity: Not on file  Stress: Not on file  Social Connections: Not on file    Allergies: No Known Allergies  Metabolic Disorder Labs: Lab Results  Component Value Date   HGBA1C 6.3 03/21/2022   No results found for: PROLACTIN Lab Results  Component Value Date   CHOL 152 03/21/2022   TRIG 136.0 03/21/2022   HDL 57.30 03/21/2022   CHOLHDL 3 03/21/2022   VLDL 27.2 03/21/2022   LDLCALC 67 03/21/2022   LDLCALC 71 11/18/2021   Lab Results  Component Value Date   TSH 4.46 03/21/2022   TSH 3.32 11/29/2020    Therapeutic Level Labs: No results found for: LITHIUM No results found for: VALPROATE No components found for:  CBMZ  Current Medications: Current Outpatient Medications  Medication Sig Dispense Refill   amLODipine (NORVASC) 2.5 MG tablet Take 1 tablet (2.5 mg total) by mouth daily. In am 90 tablet 3   calcium carbonate (OS-CAL) 600 MG TABS tablet Take 600 mg by mouth 2 (two) times daily with a meal.     Cyanocobalamin (B-12 PO) Take by mouth daily.     DULoxetine (CYMBALTA) 20 MG capsule Take 1 capsule (20 mg total) by mouth daily. 30 capsule 1   Glucosamine-Chondroitin 250-200 MG CAPS Take 1 tablet by mouth daily. TAKES 25 MCG     losartan (COZAAR) 25 MG tablet Take 1 tablet (25 mg total) by mouth daily. 90 tablet 1   mirtazapine (REMERON) 15 MG tablet TAKE 1&1/2 TABLETS BY MOUTH AT BEDTIME 135 tablet 1   rosuvastatin (CRESTOR) 20 MG tablet TAKE 1 TABLET BY MOUTH AT BEDTIME 90 tablet 3    VITAMIN D PO Take by mouth daily.     No current facility-administered medications for this visit.     Musculoskeletal: Strength & Muscle Tone:  UTA Gait & Station:  Seated Patient leans: N/A  Psychiatric Specialty Exam: Review of Systems  Psychiatric/Behavioral:  Positive for dysphoric mood. The patient is nervous/anxious.   All other systems reviewed and are negative.  Last menstrual period 11/27/1979.There is no height or weight on file to calculate BMI.  General Appearance: Casual  Eye Contact:  Fair  Speech:  Clear and Coherent  Volume:  Normal  Mood:  Anxious and Depressed  Affect:  Congruent  Thought Process:  Goal Directed and Descriptions of Associations: Intact  Orientation:  Full (Time, Place, and Person)  Thought Content: Logical   Suicidal Thoughts:  No  Homicidal Thoughts:  No  Memory:  Immediate;   Fair Recent;   Fair Remote;   Fair  Judgement:  Fair  Insight:  Fair  Psychomotor Activity:  Normal  Concentration:  Concentration: Fair and Attention Span: Fair  Recall:  [employer] of Knowledge: Fair  Language: Fair  Akathisia:  No  Handed:  Right  AIMS (if indicated): done,0  Assets:  Communication Skills Desire for Improvement Housing Intimacy Social Support  ADL's:  Intact  Cognition: WNL  Sleep:  Fair   Screenings: [place] Office Visit from 04/24/2022 in [place] Total Score 0      AUDIT    Flowsheet Row Admission (Discharged) from 12/17/2019 in [place] Admission (Discharged) from OP Visit from 12/09/2012 in [facility]  Alcohol Use Disorder Identification Test Final Score (AUDIT) 0 0      GAD-7    Flowsheet Row Video Visit from 01/18/2022 in [place] Video Visit from 05/17/2021 in [facility] Counselor from 01/14/2020 in [place]   Total GAD-7 Score 0 0 5      Mini-Mental    [place] from 02/27/2018 in [place] from 02/02/2017 in [place] from [facility]03/2016 in [facility]  Total Score (max 30 points ) 30 30 30       PHQ2-9    Flowsheet Row Video Visit from 05/10/2022 in [place] Visit from 04/24/2022 in [place] Visit from 03/23/2022 in [place] Video Visit from 01/18/2022 in [place] Visit from 11/22/2021 in [place]  PHQ-2 Total Score 4 6 0 0 0  PHQ-9 Total Score 15 19 -- -- 0      Flowsheet Row Video Visit from 05/10/2022 in [place] Office Visit from 04/24/2022 in [place] Office Visit from 10/18/2021 in [place] Low Risk No Risk No Risk        Assessment and Plan: NITARA SZCZERBA is a 76 year old Caucasian female, married, retired, lives in [place], has a history of MDD, anxiety, hyperlipidemia was evaluated by telemedicine today.  Patient continues to have anxiety, depressive symptoms although improving.  Discussed plan as noted below.  Plan MDD-improving Mirtazapine 22.5 mg p.o. nightly-dose reduced due to restless leg symptoms. Cymbalta 20 mg p.o. daily in the morning.  Did not tolerate higher doses of Cymbalta.  GAD-improving Cymbalta 20 mg p.o. daily Patient advised to establish care with a therapist-provided community resources-including information for Ms. Di Kindle [facility]-insight solutions.  Discussed referral for TMS-patient is interested in the same.  Will refer her.  Follow-up in clinic in 4 to 5 weeks or sooner if needed.  Collaboration of Care: Collaboration of Care: Referral or follow-up with  counselor/therapist AEB encouraged to establish care with therapist.  Patient/Guardian was advised Release of Information must be obtained prior to any record release in order to collaborate their care with an outside provider. Patient/Guardian was advised if they have not already done so to contact the registration department to sign all necessary forms in order for [place] to release information regarding their care.   Consent: Patient/Guardian gives verbal consent for treatment  and assignment of benefits for services provided during this visit. Patient/Guardian expressed understanding and agreed to proceed.   This note was generated in part or whole with voice recognition software. Voice recognition is usually quite accurate but there are transcription errors that can and very often do occur. I apologize for any typographical errors that were not detected and corrected.      Ursula Alert, MD 05/11/2022, 9:01 AM

## 2022-05-12 ENCOUNTER — Other Ambulatory Visit: Payer: Self-pay

## 2022-05-12 ENCOUNTER — Encounter: Payer: Self-pay | Admitting: Psychiatry

## 2022-05-12 ENCOUNTER — Emergency Department
Admission: EM | Admit: 2022-05-12 | Discharge: 2022-05-12 | Disposition: A | Discharge status: Other Acute Inpatient Hospital | Payer: Medicare PPO | Attending: Emergency Medicine | Admitting: Emergency Medicine

## 2022-05-12 ENCOUNTER — Inpatient Hospital Stay
Admission: AD | Admit: 2022-05-12 | Discharge: 2022-05-16 | DRG: 885 | Disposition: A | Discharge status: Home / Self Care | Payer: Medicare PPO | Source: Intra-hospital | Attending: Psychiatry | Admitting: Psychiatry

## 2022-05-12 ENCOUNTER — Encounter: Payer: Self-pay | Admitting: Emergency Medicine

## 2022-05-12 DIAGNOSIS — F419 Anxiety disorder, unspecified: Secondary | ICD-10-CM | POA: Diagnosis present

## 2022-05-12 DIAGNOSIS — F339 Major depressive disorder, recurrent, unspecified: Secondary | ICD-10-CM | POA: Diagnosis not present

## 2022-05-12 DIAGNOSIS — R45851 Suicidal ideations: Secondary | ICD-10-CM | POA: Diagnosis not present

## 2022-05-12 DIAGNOSIS — F332 Major depressive disorder, recurrent severe without psychotic features: Principal | ICD-10-CM

## 2022-05-12 DIAGNOSIS — Z20822 Contact with and (suspected) exposure to covid-19: Secondary | ICD-10-CM | POA: Diagnosis present

## 2022-05-12 DIAGNOSIS — G4700 Insomnia, unspecified: Secondary | ICD-10-CM | POA: Diagnosis not present

## 2022-05-12 LAB — CBC
HCT: 40.5 % (ref 36.0–46.0)
Hemoglobin: 13.6 g/dL (ref 12.0–15.0)
MCH: 29.5 pg (ref 26.0–34.0)
MCHC: 33.6 g/dL (ref 30.0–36.0)
MCV: 87.9 fL (ref 80.0–100.0)
Platelets: 327 10*3/uL (ref 150–400)
RBC: 4.61 MIL/uL (ref 3.87–5.11)
RDW: 12.7 % (ref 11.5–15.5)
WBC: 11.3 10*3/uL — ABNORMAL HIGH (ref 4.0–10.5)
nRBC: 0 % (ref 0.0–0.2)

## 2022-05-12 LAB — COMPREHENSIVE METABOLIC PANEL
ALT: 24 U/L (ref 0–44)
AST: 28 U/L (ref 15–41)
Albumin: 4.5 g/dL (ref 3.5–5.0)
Alkaline Phosphatase: 44 U/L (ref 38–126)
Anion gap: 9 (ref 5–15)
BUN: 11 mg/dL (ref 8–23)
CO2: 26 mmol/L (ref 22–32)
Calcium: 10 mg/dL (ref 8.9–10.3)
Chloride: 101 mmol/L (ref 98–111)
Creatinine, Ser: 0.72 mg/dL (ref 0.44–1.00)
GFR, Estimated: >60 mL/min (ref >60)
Glucose, Bld: 203 mg/dL — ABNORMAL HIGH (ref 70–99)
Potassium: 3.6 mmol/L (ref 3.5–5.1)
Sodium: 136 mmol/L (ref 135–145)
Total Bilirubin: 0.8 mg/dL (ref 0.3–1.2)
Total Protein: 7.4 g/dL (ref 6.5–8.1)

## 2022-05-12 LAB — RESP PANEL BY RT-PCR (FLU A&B, COVID) ARPGX2
Influenza A by PCR: NEGATIVE
Influenza B by PCR: NEGATIVE
SARS Coronavirus 2 by RT PCR: NEGATIVE

## 2022-05-12 LAB — URINE DRUG SCREEN, QUALITATIVE (ARMC ONLY)
Amphetamines, Ur Screen: NOT DETECTED
Barbiturates, Ur Screen: NOT DETECTED
Benzodiazepine, Ur Scrn: NOT DETECTED
Cannabinoid 50 Ng, Ur ~~LOC~~: NOT DETECTED
Cocaine Metabolite,Ur ~~LOC~~: NOT DETECTED
MDMA (Ecstasy)Ur Screen: NOT DETECTED
Methadone Scn, Ur: NOT DETECTED
Opiate, Ur Screen: NOT DETECTED
Phencyclidine (PCP) Ur S: NOT DETECTED
Tricyclic, Ur Screen: NOT DETECTED

## 2022-05-12 LAB — ACETAMINOPHEN LEVEL: Acetaminophen (Tylenol), Serum: <10 ug/mL — ABNORMAL LOW (ref 10–30)

## 2022-05-12 LAB — ETHANOL: Alcohol, Ethyl (B): <10 mg/dL (ref <10)

## 2022-05-12 LAB — SALICYLATE LEVEL: Salicylate Lvl: <7 mg/dL — ABNORMAL LOW (ref 7.0–30.0)

## 2022-05-12 MED ORDER — ACETAMINOPHEN 325 MG PO TABS
650.0000 mg | ORAL_TABLET | Freq: Four times a day (QID) | ORAL | Status: DC | PRN
  Administered 2022-05-13 – 2022-05-15 (×3): 650 mg via ORAL
  Filled 2022-05-12 (×3): qty 2

## 2022-05-12 MED ORDER — AMLODIPINE BESYLATE 5 MG PO TABS
2.5000 mg | ORAL_TABLET | Freq: Every day | ORAL | Status: DC
  Administered 2022-05-13 – 2022-05-16 (×4): 2.5 mg via ORAL
  Filled 2022-05-12 (×4): qty 1

## 2022-05-12 MED ORDER — MAGNESIUM HYDROXIDE 400 MG/5ML PO SUSP: 30.0000 mL | Freq: Every day | ORAL | Status: DC | PRN

## 2022-05-12 MED ORDER — ALUM & MAG HYDROXIDE-SIMETH 200-200-20 MG/5ML PO SUSP: 30.0000 mL | ORAL | Status: DC | PRN

## 2022-05-12 MED ORDER — ROSUVASTATIN CALCIUM 5 MG PO TABS
20.0000 mg | ORAL_TABLET | Freq: Every day | ORAL | Status: DC
  Administered 2022-05-12 – 2022-05-15 (×4): 20 mg via ORAL
  Filled 2022-05-12 (×4): qty 4

## 2022-05-12 MED ORDER — CALCIUM CARBONATE 1250 (500 CA) MG PO TABS
625.0000 mg | ORAL_TABLET | Freq: Two times a day (BID) | ORAL | Status: DC
  Administered 2022-05-12 – 2022-05-13 (×3): 625 mg via ORAL
  Filled 2022-05-12 (×6): qty 0.5

## 2022-05-12 MED ORDER — ACETAMINOPHEN 325 MG PO TABS
650.0000 mg | ORAL_TABLET | Freq: Once | ORAL | Status: DC
  Filled 2022-05-12: qty 2

## 2022-05-12 MED ORDER — MIRTAZAPINE 15 MG PO TABS
30.0000 mg | ORAL_TABLET | Freq: Every day | ORAL | Status: DC
  Administered 2022-05-12 – 2022-05-15 (×4): 30 mg via ORAL
  Filled 2022-05-12 (×4): qty 2

## 2022-05-12 MED ORDER — TRAZODONE HCL 50 MG PO TABS
50.0000 mg | ORAL_TABLET | Freq: Every evening | ORAL | Status: DC | PRN
  Filled 2022-05-12: qty 1

## 2022-05-12 MED ORDER — HYDROXYZINE HCL 10 MG PO TABS: 10.0000 mg | ORAL_TABLET | Freq: Three times a day (TID) | ORAL | Status: DC | PRN

## 2022-05-12 MED ORDER — GLUCOSAMINE-CHONDROITIN 250-200 MG PO CAPS
1.0000 | ORAL_CAPSULE | Freq: Every day | ORAL | Status: DC
Start: 2022-05-12 — End: 2022-05-12

## 2022-05-12 MED ORDER — LOSARTAN POTASSIUM 25 MG PO TABS: 25.0000 mg | ORAL_TABLET | Freq: Every day | ORAL | Status: DC

## 2022-05-12 MED ORDER — BUPROPION HCL ER (XL) 150 MG PO TB24
150.0000 mg | ORAL_TABLET | Freq: Every day | ORAL | Status: DC
  Administered 2022-05-13 – 2022-05-16 (×4): 150 mg via ORAL
  Filled 2022-05-12 (×4): qty 1

## 2022-05-12 MED ORDER — ACETAMINOPHEN 325 MG PO TABS
650.0000 mg | ORAL_TABLET | Freq: Four times a day (QID) | ORAL | Status: DC | PRN
  Administered 2022-05-12: 650 mg via ORAL

## 2022-05-12 NOTE — Consult Note (Signed)
[facility] Consult   Reason for Consult: Emergency room consult for patient with recurrent depression and suicidal thoughts Referring Physician: Corky Downs Patient Identification: Angelica Robertson MRN:  [id number] Principal Diagnosis: <principal problem not specified> Diagnosis:  Active Problems:   * No active hospital problems. *   Total Time spent with patient: 45 minutes  Subjective:   Angelica Robertson is a 76 y.o. female patient admitted with "my meds are not working and I cannot go on like this".  HPI: Full details will be placed in H&P.  76 year old woman with recurrent depression has had worsening over the past couple months not responding to current medicine.  Having suicidal thoughts without any specific plan.  Requesting admission.  Past Psychiatric History: Longstanding recurrent depressive episodes multiple prior hospitalizations  Risk to Self:   Risk to Others:   Prior Inpatient Therapy:   Prior Outpatient Therapy:    Past Medical History:  Past Medical History:  Diagnosis Date   Depression 2000   Heart murmur    Hypercholesterolemia Osteoporosis   Hyperlipemia     Past Surgical History:  Procedure Laterality Date   ABDOMINAL HYSTERECTOMY  1980   COLONOSCOPY WITH PROPOFOL N/A 03/05/2018   Procedure: COLONOSCOPY WITH PROPOFOL;  Surgeon: Toledo, [name], MD;  Location: [facility] ENDOSCOPY;  Service: Endoscopy;  Laterality: N/A;   Family History:  Family History  Problem Relation Age of Onset   Heart disease Mother    Diabetes Mother    Heart disease Father    Diabetes Father    Colon cancer Other    Congestive Heart Failure Brother    Colon cancer Brother    Mental illness Paternal Aunt    BRCA 1/2 Neg Hx    Breast cancer Neg Hx    Cowden syndrome Neg Hx    DES usage Neg Hx    Endometrial cancer Neg Hx    Li-Fraumeni syndrome Neg Hx    Ovarian cancer Neg Hx    Family Psychiatric  History: None reported Social History:  Social History    Substance and Sexual Activity  Alcohol Use Yes   Alcohol/week: 0.0 standard drinks   Comment: occasionally - socially during the summer a glass of wine or beer     Social History   Substance and Sexual Activity  Drug Use No    Social History   Socioeconomic History   Marital status: Married    Spouse name: robert   Number of children: 2   Years of education: Not on file   Highest education level: Not on file  Occupational History   Not on file  Tobacco Use   Smoking status: Never   Smokeless tobacco: Never  Vaping Use   Vaping Use: Never used  Substance and Sexual Activity   Alcohol use: Yes    Alcohol/week: 0.0 standard drinks    Comment: occasionally - socially during the summer a glass of wine or beer   Drug use: No   Sexual activity: Yes  Other Topics Concern   Not on file  Social History Narrative   Not on file   Social Determinants of Health   Financial Resource Strain: Not on file  Food Insecurity: Not on file  Transportation Needs: Not on file  Physical Activity: Not on file  Stress: Not on file  Social Connections: Not on file   Additional Social History:    Allergies:  No Known Allergies  Labs:  Results for orders placed or performed during the  hospital encounter of 05/12/22 (from the past 48 hour(s))  Comprehensive metabolic panel     Status: Abnormal   Collection Time: 05/12/22  9:17 AM  Result Value Ref Range   Sodium 136 135 - 145 mmol/L   Potassium 3.6 3.5 - 5.1 mmol/L   Chloride 101 98 - 111 mmol/L   CO2 26 22 - 32 mmol/L   Glucose, Bld 203 (H) 70 - 99 mg/dL    Comment: Glucose reference range applies only to samples taken after fasting for at least 8 hours.   BUN 11 8 - 23 mg/dL   Creatinine, Ser 0.72 0.44 - 1.00 mg/dL   Calcium 10.0 8.9 - 10.3 mg/dL   Total Protein 7.4 6.5 - 8.1 g/dL   Albumin 4.5 3.5 - 5.0 g/dL   AST 28 15 - 41 U/L   ALT 24 0 - 44 U/L   Alkaline Phosphatase 44 38 - 126 U/L   Total Bilirubin 0.8 0.3 - 1.2  mg/dL   GFR, Estimated >60 >60 mL/min    Comment: (NOTE) Calculated using the CKD-EPI Creatinine Equation (2021)    Anion gap 9 5 - 15    Comment: Performed at [facility], [address] [place], [place] [address]  Ethanol     Status: None   Collection Time: 05/12/22  9:17 AM  Result Value Ref Range   Alcohol, Ethyl (B) <10 <10 mg/dL    Comment: (NOTE) Lowest detectable limit for serum alcohol is 10 mg/dL.  For medical purposes only. Performed at [facility], [address] [place], [place] [address]   Salicylate level     Status: Abnormal   Collection Time: 05/12/22  9:17 AM  Result Value Ref Range   Salicylate Lvl <5.8 (L) 7.0 - 30.0 mg/dL    Comment: Performed at [facility], [address] [place], [place] [address]  Acetaminophen level     Status: Abnormal   Collection Time: 05/12/22  9:17 AM  Result Value Ref Range   Acetaminophen (Tylenol), Serum <10 (L) 10 - 30 ug/mL    Comment: (NOTE) Therapeutic concentrations vary significantly. A range of 10-30 ug/mL  may be an effective concentration for many patients. However, some  are best treated at concentrations outside of this range. Acetaminophen concentrations >150 ug/mL at 4 hours after ingestion  and >50 ug/mL at 12 hours after ingestion are often associated with  toxic reactions.  Performed at [facility], [address] [place], [place] [address]   cbc     Status: Abnormal   Collection Time: 05/12/22  9:17 AM  Result Value Ref Range   WBC 11.3 (H) 4.0 - 10.5 K/uL   RBC 4.61 3.87 - 5.11 MIL/uL   Hemoglobin 13.6 12.0 - 15.0 g/dL   HCT 40.5 36.0 - 46.0 %   MCV 87.9 80.0 - 100.0 fL   MCH 29.5 26.0 - 34.0 pg   MCHC 33.6 30.0 - 36.0 g/dL   RDW 12.7 11.5 - 15.5 %   Platelets 327 150 - 400 K/uL   nRBC 0.0 0.0 - 0.2 %    Comment: Performed at [facility], [address], [place], [place] [address]  Urine Drug Screen, Qualitative     Status: None    Collection Time: 05/12/22 11:57 AM  Result Value Ref Range   Tricyclic, Ur Screen NONE DETECTED NONE DETECTED   Amphetamines, Ur Screen NONE DETECTED NONE DETECTED   MDMA (Ecstasy)Ur Screen NONE DETECTED NONE DETECTED  Cocaine Metabolite,Ur [place] NONE DETECTED NONE DETECTED   Opiate, Ur Screen NONE DETECTED NONE DETECTED   Phencyclidine (PCP) Ur S NONE DETECTED NONE DETECTED   Cannabinoid 50 Ng, Ur [place] NONE DETECTED NONE DETECTED   Barbiturates, Ur Screen NONE DETECTED NONE DETECTED   Benzodiazepine, Ur Scrn NONE DETECTED NONE DETECTED   Methadone Scn, Ur NONE DETECTED NONE DETECTED    Comment: (NOTE) Tricyclics + metabolites, urine    Cutoff 1000 ng/mL Amphetamines + metabolites, urine  Cutoff 1000 ng/mL MDMA (Ecstasy), urine              Cutoff 500 ng/mL Cocaine Metabolite, urine          Cutoff 300 ng/mL Opiate + metabolites, urine        Cutoff 300 ng/mL Phencyclidine (PCP), urine         Cutoff 25 ng/mL Cannabinoid, urine                 Cutoff 50 ng/mL Barbiturates + metabolites, urine  Cutoff 200 ng/mL Benzodiazepine, urine              Cutoff 200 ng/mL Methadone, urine                   Cutoff 300 ng/mL  The urine drug screen provides only a preliminary, unconfirmed analytical test result and should not be used for non-medical purposes. Clinical consideration and professional judgment should be applied to any positive drug screen result due to possible interfering substances. A more specific alternate chemical method must be used in order to obtain a confirmed analytical result. Gas chromatography / mass spectrometry (GC/MS) is the preferred confirm atory method. Performed at [facility] [address] [place], [place] [address]   Resp Panel by RT-PCR (Flu A&B, Covid) Anterior Nasal Swab     Status: None   Collection Time: 05/12/22 11:57 AM   Specimen: Anterior Nasal Swab  Result Value Ref Range   SARS Coronavirus 2 by RT PCR NEGATIVE NEGATIVE    Comment:  (NOTE) SARS-CoV-2 target nucleic acids are NOT DETECTED.  The SARS-CoV-2 RNA is generally detectable in upper respiratory specimens during the acute phase of infection. The lowest concentration of SARS-CoV-2 viral copies this assay can detect is 138 copies/mL. A negative result does not preclude SARS-Cov-2 infection and should not be used as the sole basis for treatment or other patient management decisions. A negative result may occur with  improper specimen collection/handling, submission of specimen other than nasopharyngeal swab, presence of viral mutation(s) within the areas targeted by this assay, and inadequate number of viral copies(<138 copies/mL). A negative result must be combined with clinical observations, patient history, and epidemiological information. The expected result is Negative.  Fact Sheet for Patients:  EntrepreneurPulse.com.au  Fact Sheet for Healthcare Providers:  IncredibleEmployment.be  This test is no t yet approved or cleared by the [place] FDA and  has been authorized for detection and/or diagnosis of SARS-CoV-2 by FDA under an Emergency Use Authorization (EUA). This EUA will remain  in effect (meaning this test can be used) for the duration of the COVID-19 declaration under Section 564(b)(1) of the Act, 21 U.S.C.section 360bbb-3(b)(1), unless the authorization is terminated  or revoked sooner.       Influenza A by PCR NEGATIVE NEGATIVE   Influenza B by PCR NEGATIVE NEGATIVE    Comment: (NOTE) The Xpert Xpress SARS-CoV-2/FLU/RSV plus assay is intended as an aid in the diagnosis of influenza from Nasopharyngeal swab specimens  and should not be used as a sole basis for treatment. Nasal washings and aspirates are unacceptable for Xpert Xpress SARS-CoV-2/FLU/RSV testing.  Fact Sheet for Patients: EntrepreneurPulse.com.au  Fact Sheet for Healthcare  Providers: IncredibleEmployment.be  This test is not yet approved or cleared by the [place] FDA and has been authorized for detection and/or diagnosis of SARS-CoV-2 by FDA under an Emergency Use Authorization (EUA). This EUA will remain in effect (meaning this test can be used) for the duration of the COVID-19 declaration under Section 564(b)(1) of the Act, 21 U.S.C. section 360bbb-3(b)(1), unless the authorization is terminated or revoked.  Performed at [facility], [address] [place], [place] [address]     Current Facility-Administered Medications  Medication Dose Route Frequency Provider Last Rate Last Admin   acetaminophen (TYLENOL) tablet 650 mg  650 mg Oral Once Lavonia Drafts, MD       acetaminophen (TYLENOL) tablet 650 mg  650 mg Oral Q6H PRN Markham Dumlao, Madie Reno, MD   650 mg at 05/12/22 1140   Current Outpatient Medications  Medication Sig Dispense Refill   amLODipine (NORVASC) 2.5 MG tablet Take 1 tablet (2.5 mg total) by mouth daily. In am 90 tablet 3   calcium carbonate (OS-CAL) 600 MG TABS tablet Take 600 mg by mouth 2 (two) times daily with a meal.     Cyanocobalamin (B-12 PO) Take by mouth daily.     DULoxetine (CYMBALTA) 20 MG capsule Take 1 capsule (20 mg total) by mouth daily. 30 capsule 1   Glucosamine-Chondroitin 250-200 MG CAPS Take 1 tablet by mouth daily. TAKES 25 MCG     losartan (COZAAR) 25 MG tablet Take 1 tablet (25 mg total) by mouth daily. 90 tablet 1   mirtazapine (REMERON) 15 MG tablet TAKE 1&1/2 TABLETS BY MOUTH AT BEDTIME (Patient taking differently: Take 22.5 mg by mouth at bedtime. TAKE 22.5 MG (ONE AND ONE-HALF TABLETS) BY MOUTH AT BEDTIME) 135 tablet 1   rosuvastatin (CRESTOR) 20 MG tablet TAKE 1 TABLET BY MOUTH AT BEDTIME (Patient taking differently: Take 20 mg by mouth at bedtime.) 90 tablet 3   VITAMIN D PO Take by mouth daily.      Musculoskeletal: Strength & Muscle Tone: within normal limits Gait &  Station: normal Patient leans: N/A            Psychiatric Specialty Exam:  Presentation  General Appearance: No data recorded Eye Contact:No data recorded Speech:No data recorded Speech Volume:No data recorded Handedness:No data recorded  Mood and Affect  Mood:No data recorded Affect:No data recorded  Thought Process  Thought Processes:No data recorded Descriptions of Associations:No data recorded Orientation:No data recorded Thought Content:No data recorded History of Schizophrenia/Schizoaffective disorder:No  Duration of Psychotic Symptoms:No data recorded Hallucinations:No data recorded Ideas of Reference:No data recorded Suicidal Thoughts:No data recorded Homicidal Thoughts:No data recorded  Sensorium  Memory:No data recorded Judgment:No data recorded Insight:No data recorded  Executive Functions  Concentration:No data recorded Attention Span:No data recorded Recall:No data recorded Fund of Knowledge:No data recorded Language:No data recorded  Psychomotor Activity  Psychomotor Activity:No data recorded  Assets  Assets:No data recorded  Sleep  Sleep:No data recorded  Physical Exam: Physical Exam Vitals and nursing note reviewed.  Constitutional:      Appearance: Normal appearance.  HENT:     Head: Normocephalic and atraumatic.     Mouth/Throat:     Pharynx: Oropharynx is clear.  Eyes:     Pupils: Pupils are equal, round, and reactive to light.  Cardiovascular:  Rate and Rhythm: Normal rate and regular rhythm.  Pulmonary:     Effort: Pulmonary effort is normal.     Breath sounds: Normal breath sounds.  Abdominal:     General: Abdomen is flat.     Palpations: Abdomen is soft.  Musculoskeletal:        General: Normal range of motion.  Skin:    General: Skin is warm and dry.  Neurological:     General: No focal deficit present.     Mental Status: She is alert. Mental status is at baseline.  Psychiatric:        Attention and  Perception: Attention normal.        Mood and Affect: Mood is anxious and depressed. Affect is tearful.        Speech: Speech normal.        Behavior: Behavior is cooperative.        Thought Content: Thought content includes suicidal ideation. Thought content does not include suicidal plan.        Cognition and Memory: Cognition normal.        Judgment: Judgment normal.   Review of Systems  Constitutional: Negative.   HENT: Negative.    Eyes: Negative.   Respiratory: Negative.    Cardiovascular: Negative.   Gastrointestinal: Negative.   Musculoskeletal: Negative.   Skin: Negative.   Neurological: Negative.   Psychiatric/Behavioral:  Positive for depression and suicidal ideas. Negative for hallucinations and substance abuse. The patient is nervous/anxious and has insomnia.   Blood pressure (!) 145/86, pulse 93, temperature 98.3 F (36.8 C), temperature source Oral, resp. rate 15, height 5' 3"  (1.6 m), weight 59 kg, last menstrual period 11/27/1979, SpO2 94 %. Body mass index is 23.03 kg/m.  Treatment Plan Summary: Medication management and Plan patient is being admitted to the geriatric psychiatry ward.  Full details of plan and note.  Continue current level of 15-minute precautions.  Medicines reviewed.  Patient is agreeable to plan  Disposition: Recommend psychiatric Inpatient admission when medically cleared.  Alethia Berthold, MD 05/12/2022 2:09 PM

## 2022-05-12 NOTE — ED Notes (Addendum)
Pt in interview room with TTS.  

## 2022-05-12 NOTE — ED Notes (Addendum)
Pt continues to complain of a severe HA. MD made aware.

## 2022-05-12 NOTE — ED Notes (Signed)
Nurse called report to [facility] RN , Patient transferring to room 26 in geriatric Psych.

## 2022-05-12 NOTE — BH Assessment (Signed)
Comprehensive Clinical Assessment (CCA) Note  05/12/2022 Angelica Robertson [id number]  Chief Complaint:  Chief Complaint  Patient presents with   Suicidal   Visit Diagnosis: Major Depression  Angelica Robertson is a 76 year old female who presents to the ER with her husband due to increase symptoms of depression. She reports she has been on her depression medication since she was 76 years old and at times the medications stop working. Her symptoms include having thoughts of death and dying, she no longer does things she enjoys doing, she has lack motivation and energy, her sleep has decrease. Also, her appetite has decreased. She's become tearful at times and feel hopeless, helpless and worthless because she feels she's burden to her family. Throughout the interview, she was calm, cooperative and pleasant. She was able to provide appropriate answers to the questions. She denies HI and AV/H. She also denies the use of mind-altering substances.  CCA Screening, Triage and Referral (STR)  Patient Reported Information How did you hear about us? Family/Friend  What Is the Reason for Your Visit/Call Today? Increase symptoms of depression, unable to manage at this time.  How Long Has This Been Causing You Problems? 1 wk - 1 month  What Do You Feel Would Help You the Most Today? Treatment for Depression or other mood problem   Have You Recently Had Any Thoughts About Hurting Yourself? No  Are You Planning to Commit Suicide/Harm Yourself At This time? No   Have you Recently Had Thoughts About Hurting Someone Angelica Robertson? No  Are You Planning to Harm Someone at This Time? No  Explanation: No data recorded  Have You Used Any Alcohol or Drugs in the Past 24 Hours? No  How Long Ago Did You Use Drugs or Alcohol? No data recorded What Did You Use and How Much? No data recorded  Do You Currently Have a Therapist/Psychiatrist? No  Name of Therapist/Psychiatrist: No data recorded  Have You Been Recently  Discharged From Any Office Practice or Programs? No  Explanation of Discharge From Practice/Program: No data recorded    CCA Screening Triage Referral Assessment Type of Contact: Face-to-Face  Telemedicine Service Delivery:   Is this Initial or Reassessment? No data recorded Date Telepsych consult ordered in [facility]:  No data recorded Time Telepsych consult ordered in [facility]:  No data recorded [facility]  Provider [facility]   Collateral Involvement: No data recorded  Does Patient Have a Court Appointed Legal Guardian? No data recorded Name and Contact of Legal Guardian: No data recorded If Minor and Not Living with Parent(s), Who has Custody? No data recorded Is CPS involved or ever been involved? Never  Is APS involved or ever been involved? Never   Patient Determined To Be At Risk for Harm To Self or Others Based on Review of Patient Reported Information or Presenting Complaint? No  Method: No data recorded Availability of Means: No data recorded Intent: No data recorded Notification Required: No data recorded Additional Information for Danger to Others Potential: No data recorded Additional Comments for Danger to Others Potential: No data recorded Are There Guns or Other Weapons in Your Home? No data recorded Types of Guns/Weapons: No data recorded Are These Weapons Safely Secured?                            No data recorded Who Could Verify You Are Able To Have These Secured: No data recorded Do You  Have any Outstanding Charges, Pending Court Dates, Parole/Probation? No data recorded Contacted To Inform of Risk of Harm To Self or Others: No data recorded   Does Patient Present under Involuntary Commitment? No  IVC Papers Initial File Date: No data recorded  [place] of Residence: [place]   Patient Currently Receiving the Following Services: Medication Management   Determination of Need: Emergent (2 hours)   Options For Referral: ED  Visit     CCA Biopsychosocial Patient Reported Schizophrenia/Schizoaffective Diagnosis in Past: No   Strengths: Have support system, insight into problems, in treatment.   Mental Health Symptoms Depression:   Change in energy/activity; Difficulty Concentrating; Hopelessness; Increase/decrease in appetite; Sleep (too much or little); Tearfulness   Duration of Depressive symptoms:  Duration of Depressive Symptoms: Greater than two weeks   Mania:   N/A   Anxiety:    N/A   Psychosis:   None   Duration of Psychotic symptoms:    Trauma:   N/A   Obsessions:   N/A   Compulsions:   N/A   Inattention:   N/A   Hyperactivity/Impulsivity:   N/A   Oppositional/Defiant Behaviors:   N/A   Emotional Irregularity:   N/A   Other Mood/Personality Symptoms:  No data recorded   Mental Status Exam Appearance and self-care  Stature:   Average   Weight:   Average weight   Clothing:   Neat/clean   Grooming:   Normal   Cosmetic use:   Age appropriate   Posture/gait:   Normal   Motor activity:   -- (Within normal range)   Sensorium  Attention:   Normal   Concentration:   Normal   Orientation:   X5   Recall/memory:   Normal   Affect and Mood  Affect:   Appropriate; Depressed   Mood:   Depressed; Other (Comment)   Relating  Eye contact:   Normal   Facial expression:   Depressed; Sad   Attitude toward examiner:   Cooperative   Thought and Language  Speech flow:  Normal; Clear and Coherent   Thought content:   Appropriate to Mood and Circumstances   Preoccupation:   None   Hallucinations:   None   Organization:  No data recorded  [employer] of Knowledge:   Fair   Intelligence:   Average   Abstraction:   Normal   Judgement:   Fair   Dance movement psychotherapist:   Distorted   Insight:   Present   Decision Making:   Normal   Social Functioning  Social Maturity:  No data recorded  Social Judgement:    Normal   Stress  Stressors:   Relationship   Coping Ability:   Normal   Skill Deficits:   None   Supports:  No data recorded    Religion: Religion/Spirituality Are You A Religious Person?: No  Leisure/Recreation: Leisure / Recreation Do You Have Hobbies?: No  Exercise/Diet: Exercise/Diet Do You Exercise?: No Have You Gained or Lost A Significant Amount of Weight in the Past Six Months?: No Do You Follow a Special Diet?: No Do You Have Any Trouble Sleeping?: No   CCA Employment/Education Employment/Work Situation: Employment / Work Environmental consultant Job has Been Impacted by Current Illness: No Has Patient ever Been in the [employer]?: No  Education: Education Is Patient Currently Attending School?: No Did You Have An Individualized Education Program (IIEP): No Did You Have Any Difficulty At [employer]?: No Patient's Education Has Been Impacted by Current Illness: No  CCA Family/Childhood History Family and Relationship History: Family history Marital status: Married Number of Years Married: 64 What types of issues is patient dealing with in the relationship?: None Does patient have children?: Yes How many children?: 1 How is patient's relationship with their children?: Good relationship  Childhood History:  Childhood History By whom was/is the patient raised?: Mother Did patient suffer any verbal/emotional/physical/sexual abuse as a child?: No Did patient suffer from severe childhood neglect?: No Has patient ever been sexually abused/assaulted/raped as an adolescent or adult?: No Was the patient ever a victim of a crime or a disaster?: No Witnessed domestic violence?: No Has patient been affected by domestic violence as an adult?: No  Child/Adolescent Assessment:     CCA Substance Use Alcohol/Drug Use: Alcohol / Drug Use Pain Medications: See PTA Prescriptions: See PTA Over the Counter: See PTA History of alcohol / drug use?: No history of  alcohol / drug abuse Longest period of sobriety (when/how long): n/a    ASAM's:  Six Dimensions of Multidimensional Assessment  Dimension 1:  Acute Intoxication and/or Withdrawal Potential:      Dimension 2:  Biomedical Conditions and Complications:      Dimension 3:  Emotional, Behavioral, or Cognitive Conditions and Complications:     Dimension 4:  Readiness to Change:     Dimension 5:  Relapse, Continued use, or Continued Problem Potential:     Dimension 6:  Recovery/Living Environment:     ASAM Severity Score:    ASAM Recommended Level of Treatment:     Substance use Disorder (SUD)    Recommendations for Services/Supports/Treatments:    Discharge Disposition:    DSM5 Diagnoses: Patient Active Problem List   Diagnosis Date Noted   Dizziness 03/26/2022   Hypertension 12/01/2020   MDD (major depressive disorder), recurrent, in full remission (HCC) 08/03/2020   GAD (generalized anxiety disorder) 01/12/2020   B12 deficiency 12/23/2019   Hyponatremia 12/23/2019   Leukocytosis 12/23/2019   MDD (major depressive disorder), recurrent severe, without psychosis (HCC) 12/18/2019   Suicidal ideation    Recurrent major depressive disorder, in partial remission (HCC) 12/10/2019   Anxiety 12/10/2019   Close exposure to COVID-19 virus 08/04/2019   Restless leg syndrome 06/02/2019   Colon polyps 01/01/2018   Syncope 12/30/2017   Hyperglycemia 08/26/2015   Cervicalgia 05/30/2015   Health care maintenance 02/28/2015   Osteopenia 08/17/2014   Neck pain 06/14/2014   Elevated blood pressure reading 06/14/2014   Sinus tachycardia 11/15/2012   Depression, major, single episode, mild (HCC) 10/10/2012   Hypercholesterolemia 10/10/2012     Referrals to Alternative Service(s): Referred to Alternative Service(s):   Place:   Date:   Time:    Referred to Alternative Service(s):   Place:   Date:   Time:    Referred to Alternative Service(s):   Place:   Date:   Time:    Referred to  Alternative Service(s):   Place:   Date:   Time:     Lilyan Gilford MS, LCAS, [facility], [facility] Therapeutic Triage Specialist 05/12/2022 12:20 PM

## 2022-05-12 NOTE — Tx Team (Cosign Needed)
Initial Treatment Plan 05/12/2022 4:26 PM Angelica Robertson [id number]    PATIENT STRESSORS: Other: ongoing depression. Meds not working.      PATIENT STRENGTHS: Active sense of humor  Communication skills  [employer] fund of knowledge  Religious Affiliation    PATIENT IDENTIFIED PROBLEMS: Meds are not working                      DISCHARGE CRITERIA:  Ability to meet basic life and health needs Improved stabilization in mood, thinking, and/or behavior  PRELIMINARY DISCHARGE PLAN: Return to previous living arrangement  PATIENT/FAMILY INVOLVEMENT: This treatment plan has been presented to and reviewed with the patient, Angelica Robertson. The patient has been given the opportunity to ask questions and make suggestions.  Nolon Bussing, RN 05/12/2022, 4:26 PM

## 2022-05-12 NOTE — ED Triage Notes (Signed)
Pt to ED via POV for depression. Pt states that she sees psychiatrist here at [facility]. Pt states that she has been hospitalized in the past with major depression. Pt states that she feels like her medications are not working. Pt states that she is having thoughts of harming herself. Pt states that she has never attempted to harm herself. Pt has never had a plan as to what she would do to harm herself. Pt denies thoughts of harming others. Pt states that they are having some family issue with their daughter and that this could be causing this. Pt is in NAD at this time and is calm and cooperative in triage.

## 2022-05-12 NOTE — Progress Notes (Signed)
Chaplain provided emotional and spiritual support to [place]. She reports her depressions episodes have a trigger to changes in family dynamics. Most recently adjusting a will to exclude a daughter, previous episode when daughter cut family off. Neftali has a deep faith in a non-denominational church. She finds peace when no longer blaming herself. Chaplain offered her words of assurance, affirmation of boundaries, and prayer for radical acceptance. Husband arrived with her bible and daily devotions.     05/12/22 1900  Clinical Encounter Type  Visited With Patient  Visit Type Initial;Psychological support;Spiritual support  Spiritual Encounters  Spiritual Needs Prayer;Emotional

## 2022-05-12 NOTE — ED Notes (Signed)
Pt given lunch tray and beverage 

## 2022-05-12 NOTE — ED Notes (Signed)
Pt given snack and beverage. 

## 2022-05-12 NOTE — Plan of Care (Signed)
  Problem: Education: Goal: Knowledge of [facility] Education information/materials will improve Outcome: Progressing Goal: Emotional status will improve Outcome: Progressing Goal: Mental status will improve Outcome: Progressing Goal: Verbalization of understanding the information provided will improve Outcome: Progressing   Problem: Activity: Goal: Interest or engagement in activities will improve Outcome: Progressing Goal: Sleeping patterns will improve Outcome: Progressing   Problem: Coping: Goal: Ability to verbalize frustrations and anger appropriately will improve Outcome: Progressing Goal: Ability to demonstrate self-control will improve Outcome: Progressing   Problem: Health Behavior/Discharge Planning: Goal: Identification of resources available to assist in meeting health care needs will improve Outcome: Progressing Goal: Compliance with treatment plan for underlying cause of condition will improve Outcome: Progressing   Problem: Physical Regulation: Goal: Ability to maintain clinical measurements within normal limits will improve Outcome: Progressing   Problem: Safety: Goal: Periods of time without injury will increase Outcome: Progressing   Problem: Education: Goal: Utilization of techniques to improve thought processes will improve Outcome: Progressing Goal: Knowledge of the prescribed therapeutic regimen will improve Outcome: Progressing   Problem: Activity: Goal: Interest or engagement in leisure activities will improve Outcome: Progressing Goal: Imbalance in normal sleep/wake cycle will improve Outcome: Progressing   Problem: Coping: Goal: Coping ability will improve Outcome: Progressing Goal: Will verbalize feelings Outcome: Progressing   Problem: Health Behavior/Discharge Planning: Goal: Ability to make decisions will improve Outcome: Progressing Goal: Compliance with therapeutic regimen will improve Outcome: Progressing    Problem: Role Relationship: Goal: Will demonstrate positive changes in social behaviors and relationships Outcome: Progressing   Problem: Safety: Goal: Ability to disclose and discuss suicidal ideas will improve Outcome: Progressing Goal: Ability to identify and utilize support systems that promote safety will improve Outcome: Progressing   Problem: Self-Concept: Goal: Will verbalize positive feelings about self Outcome: Progressing Goal: Level of anxiety will decrease Outcome: Progressing   

## 2022-05-12 NOTE — H&P (Signed)
Psychiatric Admission Assessment Adult  Patient Identification: Angelica Robertson MRN:  [id number] Date of Evaluation:  05/12/2022 Chief Complaint:  Severe recurrent major depression without psychotic features ([place]) [F33.2] Principal Diagnosis: Severe recurrent major depression without psychotic features ([place]) Diagnosis:  Principal Problem:   Severe recurrent major depression without psychotic features ([place])  History of Present Illness: Patient seen and chart reviewed.  Patient presented to the emergency room because of worsening depression.  She has a history of depression but the mood has been worse for the past couple months.  She has been feeling more down and negative.  Lost interest in her usual activities.  Great deal of trouble sleeping at night and also not eating very well.  Suicidal thoughts without specific plan or intent.  Denies any psychotic symptoms.  She is having worse symptoms late at night and early in the morning.  She has been compliant with her medicine but feels that the medicine she is being prescribed has not recently helped.  Cymbalta was recently added by her doctor and she felt that this caused her to have dry mouth and headaches.  She has been compliant with other medicine.  No alcohol or drug abuse.  Having some stressful experiences with one of her children but no other major issues. Associated Signs/Symptoms: Depression Symptoms:  depressed mood, anhedonia, insomnia, fatigue, difficulty concentrating, suicidal thoughts without plan, Duration of Depression Symptoms: Greater than two weeks  (Hypo) Manic Symptoms:   None reported Anxiety Symptoms:  Excessive Worry, Psychotic Symptoms:   None PTSD Symptoms: None Total Time spent with patient: 1 hour  Past Psychiatric History: Patient has a long history of depression.  Has had several prior hospitalizations.  Never had any past suicide attempts but has had suicidal ideation.  Multiple medicines in the past often with  only temporary benefit  Is the patient at risk to self? Yes.    Has the patient been a risk to self in the past 6 months? Yes.    Has the patient been a risk to self within the distant past? Yes.    Is the patient a risk to others? No.  Has the patient been a risk to others in the past 6 months? No.  Has the patient been a risk to others within the distant past? No.   Prior Inpatient Therapy:   Prior Outpatient Therapy:    Alcohol Screening: 1. How often do you have a drink containing alcohol?: Never 2. How many drinks containing alcohol do you have on a typical day when you are drinking?: 1 or 2 3. How often do you have six or more drinks on one occasion?: Never AUDIT-C Score: 0 4. How often during the last year have you found that you were not able to stop drinking once you had started?: Never 5. How often during the last year have you failed to do what was normally expected from you because of drinking?: Never 6. How often during the last year have you needed a first drink in the morning to get yourself going after a heavy drinking session?: Never 7. How often during the last year have you had a feeling of guilt of remorse after drinking?: Never 8. How often during the last year have you been unable to remember what happened the night before because you had been drinking?: Never 9. Have you or someone else been injured as a result of your drinking?: No 10. Has a relative or friend or a doctor or another health  worker been concerned about your drinking or suggested you cut down?: No Alcohol Use Disorder Identification Test Final Score (AUDIT): 0 Substance Abuse History in the last 12 months:  No. Consequences of Substance Abuse: Negative Previous Psychotropic Medications: Yes  Psychological Evaluations: Yes  Past Medical History:  Past Medical History:  Diagnosis Date   Depression 2000   Heart murmur    Hypercholesterolemia Osteoporosis   Hyperlipemia     Past Surgical History:   Procedure Laterality Date   ABDOMINAL HYSTERECTOMY  1980   COLONOSCOPY WITH PROPOFOL N/A 03/05/2018   Procedure: COLONOSCOPY WITH PROPOFOL;  Surgeon: Toledo, [name], MD;  Location: [facility] ENDOSCOPY;  Service: Endoscopy;  Laterality: N/A;   Family History:  Family History  Problem Relation Age of Onset   Heart disease Mother    Diabetes Mother    Heart disease Father    Diabetes Father    Colon cancer Other    Congestive Heart Failure Brother    Colon cancer Brother    Mental illness Paternal Aunt    BRCA 1/2 Neg Hx    Breast cancer Neg Hx    Cowden syndrome Neg Hx    DES usage Neg Hx    Endometrial cancer Neg Hx    Li-Fraumeni syndrome Neg Hx    Ovarian cancer Neg Hx    Family Psychiatric  History: See previous Tobacco Screening:   Social History:  Social History   Substance and Sexual Activity  Alcohol Use Yes   Alcohol/week: 0.0 standard drinks   Comment: occasionally - socially during the summer a glass of wine or beer     Social History   Substance and Sexual Activity  Drug Use No    Additional Social History:                           Allergies:  No Known Allergies Lab Results:  Results for orders placed or performed during the hospital encounter of 05/12/22 (from the past 48 hour(s))  Comprehensive metabolic panel     Status: Abnormal   Collection Time: 05/12/22  9:17 AM  Result Value Ref Range   Sodium 136 135 - 145 mmol/L   Potassium 3.6 3.5 - 5.1 mmol/L   Chloride 101 98 - 111 mmol/L   CO2 26 22 - 32 mmol/L   Glucose, Bld 203 (H) 70 - 99 mg/dL    Comment: Glucose reference range applies only to samples taken after fasting for at least 8 hours.   BUN 11 8 - 23 mg/dL   Creatinine, Ser 0.72 0.44 - 1.00 mg/dL   Calcium 10.0 8.9 - 10.3 mg/dL   Total Protein 7.4 6.5 - 8.1 g/dL   Albumin 4.5 3.5 - 5.0 g/dL   AST 28 15 - 41 U/L   ALT 24 0 - 44 U/L   Alkaline Phosphatase 44 38 - 126 U/L   Total Bilirubin 0.8 0.3 - 1.2 mg/dL   GFR, Estimated  >60 >60 mL/min    Comment: (NOTE) Calculated using the CKD-EPI Creatinine Equation (2021)    Anion gap 9 5 - 15    Comment: Performed at [facility], [address] [place], [place] [address]  Ethanol     Status: None   Collection Time: 05/12/22  9:17 AM  Result Value Ref Range   Alcohol, Ethyl (B) <10 <10 mg/dL    Comment: (NOTE) Lowest detectable limit for serum alcohol is 10 mg/dL.  For medical  purposes only. Performed at [facility], [address] [place], [place] [address]   Salicylate level     Status: Abnormal   Collection Time: 05/12/22  9:17 AM  Result Value Ref Range   Salicylate Lvl <6.4 (L) 7.0 - 30.0 mg/dL    Comment: Performed at [facility], [address] [place], [place] [address]  Acetaminophen level     Status: Abnormal   Collection Time: 05/12/22  9:17 AM  Result Value Ref Range   Acetaminophen (Tylenol), Serum <10 (L) 10 - 30 ug/mL    Comment: (NOTE) Therapeutic concentrations vary significantly. A range of 10-30 ug/mL  may be an effective concentration for many patients. However, some  are best treated at concentrations outside of this range. Acetaminophen concentrations >150 ug/mL at 4 hours after ingestion  and >50 ug/mL at 12 hours after ingestion are often associated with  toxic reactions.  Performed at [facility], [address] [place], [place] [address]   cbc     Status: Abnormal   Collection Time: 05/12/22  9:17 AM  Result Value Ref Range   WBC 11.3 (H) 4.0 - 10.5 K/uL   RBC 4.61 3.87 - 5.11 MIL/uL   Hemoglobin 13.6 12.0 - 15.0 g/dL   HCT 40.5 36.0 - 46.0 %   MCV 87.9 80.0 - 100.0 fL   MCH 29.5 26.0 - 34.0 pg   MCHC 33.6 30.0 - 36.0 g/dL   RDW 12.7 11.5 - 15.5 %   Platelets 327 150 - 400 K/uL   nRBC 0.0 0.0 - 0.2 %    Comment: Performed at [facility], [address] [place], [place] [address]  Urine Drug Screen, Qualitative     Status: None   Collection Time: 05/12/22 11:57  AM  Result Value Ref Range   Tricyclic, Ur Screen NONE DETECTED NONE DETECTED   Amphetamines, Ur Screen NONE DETECTED NONE DETECTED   MDMA (Ecstasy)Ur Screen NONE DETECTED NONE DETECTED   Cocaine Metabolite,Ur [place] NONE DETECTED NONE DETECTED   Opiate, Ur Screen NONE DETECTED NONE DETECTED   Phencyclidine (PCP) Ur S NONE DETECTED NONE DETECTED   Cannabinoid 50 Ng, Ur [place] NONE DETECTED NONE DETECTED   Barbiturates, Ur Screen NONE DETECTED NONE DETECTED   Benzodiazepine, Ur Scrn NONE DETECTED NONE DETECTED   Methadone Scn, Ur NONE DETECTED NONE DETECTED    Comment: (NOTE) Tricyclics + metabolites, urine    Cutoff 1000 ng/mL Amphetamines + metabolites, urine  Cutoff 1000 ng/mL MDMA (Ecstasy), urine              Cutoff 500 ng/mL Cocaine Metabolite, urine          Cutoff 300 ng/mL Opiate + metabolites, urine        Cutoff 300 ng/mL Phencyclidine (PCP), urine         Cutoff 25 ng/mL Cannabinoid, urine                 Cutoff 50 ng/mL Barbiturates + metabolites, urine  Cutoff 200 ng/mL Benzodiazepine, urine              Cutoff 200 ng/mL Methadone, urine                   Cutoff 300 ng/mL  The urine drug screen provides only a preliminary, unconfirmed analytical test result and should not be used for non-medical purposes. Clinical consideration and professional judgment should be applied to any positive drug screen result due to possible interfering substances.  A more specific alternate chemical method must be used in order to obtain a confirmed analytical result. Gas chromatography / mass spectrometry (GC/MS) is the preferred confirm atory method. Performed at [facility], [address] [place], [place] [address]   Resp Panel by RT-PCR (Flu A&B, Covid) Anterior Nasal Swab     Status: None   Collection Time: 05/12/22 11:57 AM   Specimen: Anterior Nasal Swab  Result Value Ref Range   SARS Coronavirus 2 by RT PCR NEGATIVE NEGATIVE    Comment: (NOTE) SARS-CoV-2 target nucleic  acids are NOT DETECTED.  The SARS-CoV-2 RNA is generally detectable in upper respiratory specimens during the acute phase of infection. The lowest concentration of SARS-CoV-2 viral copies this assay can detect is 138 copies/mL. A negative result does not preclude SARS-Cov-2 infection and should not be used as the sole basis for treatment or other patient management decisions. A negative result may occur with  improper specimen collection/handling, submission of specimen other than nasopharyngeal swab, presence of viral mutation(s) within the areas targeted by this assay, and inadequate number of viral copies(<138 copies/mL). A negative result must be combined with clinical observations, patient history, and epidemiological information. The expected result is Negative.  Fact Sheet for Patients:  EntrepreneurPulse.com.au  Fact Sheet for Healthcare Providers:  IncredibleEmployment.be  This test is no t yet approved or cleared by the [place] FDA and  has been authorized for detection and/or diagnosis of SARS-CoV-2 by FDA under an Emergency Use Authorization (EUA). This EUA will remain  in effect (meaning this test can be used) for the duration of the COVID-19 declaration under Section 564(b)(1) of the Act, 21 U.S.C.section 360bbb-3(b)(1), unless the authorization is terminated  or revoked sooner.       Influenza A by PCR NEGATIVE NEGATIVE   Influenza B by PCR NEGATIVE NEGATIVE    Comment: (NOTE) The Xpert Xpress SARS-CoV-2/FLU/RSV plus assay is intended as an aid in the diagnosis of influenza from Nasopharyngeal swab specimens and should not be used as a sole basis for treatment. Nasal washings and aspirates are unacceptable for Xpert Xpress SARS-CoV-2/FLU/RSV testing.  Fact Sheet for Patients: EntrepreneurPulse.com.au  Fact Sheet for Healthcare Providers: IncredibleEmployment.be  This test is not  yet approved or cleared by the [place] FDA and has been authorized for detection and/or diagnosis of SARS-CoV-2 by FDA under an Emergency Use Authorization (EUA). This EUA will remain in effect (meaning this test can be used) for the duration of the COVID-19 declaration under Section 564(b)(1) of the Act, 21 U.S.C. section 360bbb-3(b)(1), unless the authorization is terminated or revoked.  Performed at [facility], [address] [place], [place] [address]     Blood Alcohol level:  Lab Results  Component Value Date   [facility] <10 05/12/2022   ETH <10 32/35/5732    Metabolic Disorder Labs:  Lab Results  Component Value Date   HGBA1C 6.3 03/21/2022   No results found for: PROLACTIN Lab Results  Component Value Date   CHOL 152 03/21/2022   TRIG 136.0 03/21/2022   HDL 57.30 03/21/2022   CHOLHDL 3 03/21/2022   VLDL 27.2 03/21/2022   LDLCALC 67 03/21/2022   LDLCALC 71 11/18/2021    Current Medications: Current Facility-Administered Medications  Medication Dose Route Frequency Provider Last Rate Last Admin   acetaminophen (TYLENOL) tablet 650 mg  650 mg Oral Q6H PRN Jeniece Hannis, Madie Reno, MD       alum & mag hydroxide-simeth (MAALOX/MYLANTA) 200-200-20 MG/5ML suspension 30 mL  30 mL Oral Q4H PRN Dennisha Mouser, Madie Reno, MD       [START ON 05/13/2022] amLODipine (NORVASC) tablet 2.5 mg  2.5 mg Oral Daily Rashmi Tallent, Madie Reno, MD       [START ON 05/13/2022] buPROPion (WELLBUTRIN XL) 24 hr tablet 150 mg  150 mg Oral Daily Taydem Cavagnaro T, MD       calcium carbonate (OS-CAL - dosed in mg of elemental calcium) tablet 625 mg  625 mg Oral BID WC Daanya Lanphier, Madie Reno, MD   625 mg at 05/12/22 1635   hydrOXYzine (ATARAX) tablet 10 mg  10 mg Oral TID PRN Tyjay Galindo, Madie Reno, MD       [START ON 05/13/2022] losartan (COZAAR) tablet 25 mg  25 mg Oral Daily Deylan Canterbury T, MD       magnesium hydroxide (MILK OF MAGNESIA) suspension 30 mL  30 mL Oral Daily PRN Barrett Holthaus T, MD       mirtazapine (REMERON)  tablet 30 mg  30 mg Oral QHS Zafir Schauer T, MD       rosuvastatin (CRESTOR) tablet 20 mg  20 mg Oral QHS Dawne Casali T, MD       traZODone (DESYREL) tablet 50 mg  50 mg Oral QHS PRN Artavia Jeanlouis, Madie Reno, MD       PTA Medications: Medications Prior to Admission  Medication Sig Dispense Refill Last Dose   amLODipine (NORVASC) 2.5 MG tablet Take 1 tablet (2.5 mg total) by mouth daily. In am 90 tablet 3    calcium carbonate (OS-CAL) 600 MG TABS tablet Take 600 mg by mouth 2 (two) times daily with a meal.      Cyanocobalamin (B-12 PO) Take by mouth daily.      DULoxetine (CYMBALTA) 20 MG capsule Take 1 capsule (20 mg total) by mouth daily. 30 capsule 1    Glucosamine-Chondroitin 250-200 MG CAPS Take 1 tablet by mouth daily. TAKES 25 MCG      losartan (COZAAR) 25 MG tablet Take 1 tablet (25 mg total) by mouth daily. 90 tablet 1    mirtazapine (REMERON) 15 MG tablet TAKE 1&1/2 TABLETS BY MOUTH AT BEDTIME (Patient taking differently: Take 22.5 mg by mouth at bedtime. TAKE 22.5 MG (ONE AND ONE-HALF TABLETS) BY MOUTH AT BEDTIME) 135 tablet 1    rosuvastatin (CRESTOR) 20 MG tablet TAKE 1 TABLET BY MOUTH AT BEDTIME (Patient taking differently: Take 20 mg by mouth at bedtime.) 90 tablet 3    VITAMIN D PO Take by mouth daily.       Musculoskeletal: Strength & Muscle Tone: within normal limits Gait & Station: normal Patient leans: N/A            Psychiatric Specialty Exam:  Presentation  General Appearance: No data recorded Eye Contact:No data recorded Speech:No data recorded Speech Volume:No data recorded Handedness:No data recorded  Mood and Affect  Mood:No data recorded Affect:No data recorded  Thought Process  Thought Processes:No data recorded Duration of Psychotic Symptoms: No data recorded Past Diagnosis of Schizophrenia or Psychoactive disorder: No  Descriptions of Associations:No data recorded Orientation:No data recorded Thought Content:No data  recorded Hallucinations:No data recorded Ideas of Reference:No data recorded Suicidal Thoughts:No data recorded Homicidal Thoughts:No data recorded  Sensorium  Memory:No data recorded Judgment:No data recorded Insight:No data recorded  Executive Functions  Concentration:No data recorded Attention Span:No data recorded Recall:No data recorded Fund of Knowledge:No data recorded Language:No data recorded  Psychomotor Activity  Psychomotor Activity:No data recorded  Assets  Assets:No data recorded  Sleep  Sleep:No data recorded   Physical Exam: Physical Exam Vitals and nursing note reviewed.  Constitutional:      Appearance: Normal appearance.  HENT:     Head: Normocephalic and atraumatic.     Mouth/Throat:     Pharynx: Oropharynx is clear.  Eyes:     Pupils: Pupils are equal, round, and reactive to light.  Cardiovascular:     Rate and Rhythm: Normal rate and regular rhythm.  Pulmonary:     Effort: Pulmonary effort is normal.     Breath sounds: Normal breath sounds.  Abdominal:     General: Abdomen is flat.     Palpations: Abdomen is soft.  Musculoskeletal:        General: Normal range of motion.  Skin:    General: Skin is warm and dry.  Neurological:     General: No focal deficit present.     Mental Status: She is alert. Mental status is at baseline.  Psychiatric:        Attention and Perception: Attention normal.        Mood and Affect: Mood is anxious and depressed. Affect is tearful.        Speech: Speech is delayed.        Behavior: Behavior normal.        Thought Content: Thought content normal.        Cognition and Memory: Cognition normal.   Review of Systems  Constitutional: Negative.   HENT: Negative.    Eyes: Negative.   Respiratory: Negative.    Cardiovascular: Negative.   Gastrointestinal: Negative.   Musculoskeletal: Negative.   Skin: Negative.   Neurological: Negative.   Psychiatric/Behavioral:  Positive for depression and suicidal  ideas. The patient is nervous/anxious.   Blood pressure 135/71, pulse 91, temperature 97.6 F (36.4 C), temperature source Oral, resp. rate 18, height 5' 3"  (1.6 m), weight 59 kg, last menstrual period 11/27/1979, SpO2 97 %. Body mass index is 23.03 kg/m.  Treatment Plan Summary: Admit to psychiatric ward.  Continue 15-minute checks.  Full labs will be reviewed.  Increase Remeron dose as she is only taking 22-1/2 mg at this time.  Add low-dose Wellbutrin in the morning.  Engage in individual and group therapy ongoing daily assessment of mood and safety.  Observation Level/Precautions:  15 minute checks  Laboratory:  UDS  Psychotherapy:    Medications:    Consultations:    Discharge Concerns:    Estimated LOS:  Other:     Physician Treatment Plan for Primary Diagnosis: Severe recurrent major depression without psychotic features ([place]) Long Term Goal(s): Improvement in symptoms so as ready for discharge  Short Term Goals: Ability to verbalize feelings will improve, Ability to disclose and discuss suicidal ideas, and Ability to demonstrate self-control will improve  Physician Treatment Plan for Secondary Diagnosis: Principal Problem:   Severe recurrent major depression without psychotic features ([place])  Long Term Goal(s): Improvement in symptoms so as ready for discharge  Short Term Goals: Compliance with prescribed medications will improve  I certify that inpatient services furnished can reasonably be expected to improve the patient's condition.    Alethia Berthold, MD 6/2/20236:47 PM

## 2022-05-12 NOTE — ED Notes (Signed)
Patient dressed out by this RN and Terrence Dupont, EDT in hospital provided scrubs. Pt belongings placed in belongings bag and given to husband. Pt had with them the following belongings:   1 pair of black nike slid on shoes  1 pair of black capri pants 1 cream colored bra 1 white and pink floral shirt 1 pair of white underwear   Pt sent 3 rings with her, these were placed in a belongings cup and given to patients husband to take home. Pts belongings given to pts husband in pts belongings bag to take home with him.

## 2022-05-12 NOTE — [facility] Suicide Risk Assessment (Signed)
[facility] Admission Suicide Risk Assessment   Nursing information obtained from:    Demographic factors:  Age 76 or older, Caucasian Current Mental Status:  NA Loss Factors:  NA Historical Factors:  NA Risk Reduction Factors:  Positive therapeutic relationship  Total Time spent with patient: 1 hour Principal Problem: Severe recurrent major depression without psychotic features (HCC) Diagnosis:  Principal Problem:   Severe recurrent major depression without psychotic features (HCC)  Subjective Data: Patient seen and chart reviewed.  This is a 76 year old woman with a history of chronic depression who has been feeling much worse recently with suicidal ideation.  No intention or plan to act on it.  No psychotic symptoms.  Agrees to appropriate treatment planning  Continued Clinical Symptoms:  Alcohol Use Disorder Identification Test Final Score (AUDIT): 0 The "Alcohol Use Disorders Identification Test", Guidelines for Use in Primary Care, Second Edition.  World Science writer [facility]). Score between 0-7:  no or low risk or alcohol related problems. Score between 8-15:  moderate risk of alcohol related problems. Score between 16-19:  high risk of alcohol related problems. Score 20 or above:  warrants further diagnostic evaluation for alcohol dependence and treatment.   CLINICAL FACTORS:   Depression:   Insomnia   Musculoskeletal: Strength & Muscle Tone: within normal limits Gait & Station: normal Patient leans: N/A  Psychiatric Specialty Exam:  Presentation  General Appearance: No data recorded Eye Contact:No data recorded Speech:No data recorded Speech Volume:No data recorded Handedness:No data recorded  Mood and Affect  Mood:No data recorded Affect:No data recorded  Thought Process  Thought Processes:No data recorded Descriptions of Associations:No data recorded Orientation:No data recorded Thought Content:No data recorded History of Schizophrenia/Schizoaffective  disorder:No  Duration of Psychotic Symptoms:No data recorded Hallucinations:No data recorded Ideas of Reference:No data recorded Suicidal Thoughts:No data recorded Homicidal Thoughts:No data recorded  Sensorium  Memory:No data recorded Judgment:No data recorded Insight:No data recorded  Executive Functions  Concentration:No data recorded Attention Span:No data recorded Recall:No data recorded Fund of Knowledge:No data recorded Language:No data recorded  Psychomotor Activity  Psychomotor Activity:No data recorded  Assets  Assets:No data recorded  Sleep  Sleep:No data recorded   Physical Exam: Physical Exam Vitals and nursing note reviewed.  Constitutional:      Appearance: Normal appearance.  HENT:     Head: Normocephalic and atraumatic.     Mouth/Throat:     Pharynx: Oropharynx is clear.  Eyes:     Pupils: Pupils are equal, round, and reactive to light.  Cardiovascular:     Rate and Rhythm: Normal rate and regular rhythm.  Pulmonary:     Effort: Pulmonary effort is normal.     Breath sounds: Normal breath sounds.  Abdominal:     General: Abdomen is flat.     Palpations: Abdomen is soft.  Musculoskeletal:        General: Normal range of motion.  Skin:    General: Skin is warm and dry.  Neurological:     General: No focal deficit present.     Mental Status: She is alert. Mental status is at baseline.  Psychiatric:        Attention and Perception: Attention normal.        Mood and Affect: Mood is anxious and depressed. Affect is tearful.        Speech: Speech normal.        Behavior: Behavior is cooperative.        Thought Content: Thought content normal.  Cognition and Memory: Cognition normal.   Review of Systems  Constitutional: Negative.   HENT: Negative.    Eyes: Negative.   Respiratory: Negative.    Cardiovascular: Negative.   Gastrointestinal: Negative.   Musculoskeletal: Negative.   Skin: Negative.   Neurological: Negative.    Psychiatric/Behavioral:  Positive for depression and suicidal ideas. The patient is nervous/anxious.   Blood pressure 135/71, pulse 91, temperature 97.6 F (36.4 C), temperature source Oral, resp. rate 18, height 5\' 3"  (1.6 m), weight 59 kg, last menstrual period 11/27/1979, SpO2 97 %. Body mass index is 23.03 kg/m.   COGNITIVE FEATURES THAT CONTRIBUTE TO RISK:  None    SUICIDE RISK:   Mild:  Suicidal ideation of limited frequency, intensity, duration, and specificity.  There are no identifiable plans, no associated intent, mild dysphoria and related symptoms, good self-control (both objective and subjective assessment), few other risk factors, and identifiable protective factors, including available and accessible social support.  PLAN OF CARE: Continue 15-minute checks.  Change medicines to treat depression.  Engage in individual and group counseling and treatment team evaluation.  Ongoing assessment of dangerousness prior to discharge  I certify that inpatient services furnished can reasonably be expected to improve the patient's condition.   11/29/1977, MD 05/12/2022, 6:46 PM

## 2022-05-12 NOTE — ED Provider Notes (Signed)
[facility] Provider Note    Event Date/Time   First MD Initiated Contact with Patient 05/12/22 [phone]     (approximate)   History   Suicidal   HPI  Angelica Robertson is a 76 y.o. female with a history of depression presents with complaints of suicidal ideations, severe depression.  She thinks may related to difficulty she is having with her daughter.  She was sent in by her psychiatrist. no physical complaints at this time     Physical Exam   Triage Vital Signs: ED Triage Vitals  Enc Vitals Group     BP 05/12/22 0917 (!) 145/86     Pulse Rate 05/12/22 0917 93     Resp 05/12/22 0917 15     Temp 05/12/22 0917 98.3 F (36.8 C)     Temp Source 05/12/22 0917 Oral     SpO2 05/12/22 0917 94 %     Weight 05/12/22 0917 59 kg (130 lb)     Height 05/12/22 0917 1.6 m (5\' 3" )     Head Circumference --      Peak Flow --      Pain Score 05/12/22 0923 5     Pain Loc --      Pain Edu? --      Excl. in [place]? --     Most recent vital signs: Vitals:   05/12/22 0917 05/12/22 0923  BP: (!) 145/86   Pulse: 93   Resp: 15   Temp: 98.3 F (36.8 C) 98.3 F (36.8 C)  SpO2: 94%      General: Awake, no distress.  CV:  Good peripheral perfusion.  Resp:  Normal effort.  Abd:  No distention.  Other:     ED Results / Procedures / Treatments   Labs (all labs ordered are listed, but only abnormal results are displayed) Labs Reviewed  COMPREHENSIVE METABOLIC PANEL - Abnormal; Notable for the following components:      Result Value   Glucose, Bld 203 (*)    All other components within normal limits  SALICYLATE LEVEL - Abnormal; Notable for the following components:   Salicylate Lvl [id number] (*)    All other components within normal limits  ACETAMINOPHEN LEVEL - Abnormal; Notable for the following components:   Acetaminophen (Tylenol), Serum <10 (*)    All other components within normal limits  CBC - Abnormal; Notable for the following components:   WBC 11.3 (*)     All other components within normal limits  RESP PANEL BY RT-PCR (FLU A&B, COVID) ARPGX2  ETHANOL  URINE DRUG SCREEN, QUALITATIVE ([facility] ONLY)     EKG     RADIOLOGY     PROCEDURES:  Critical Care performed:   Procedures   MEDICATIONS ORDERED IN ED: Medications  acetaminophen (TYLENOL) tablet 650 mg (0 mg Oral Hold 05/12/22 1140)  acetaminophen (TYLENOL) tablet 650 mg (650 mg Oral Given 05/12/22 1140)     IMPRESSION / MDM / ASSESSMENT AND PLAN / ED COURSE  I reviewed the triage vital signs and the nursing notes. Patient's presentation is most consistent with acute presentation with potential threat to life or bodily function.  Patient presents with suicidal ideation with a history of depression.  Sent in by her psychiatrist.  Lab work reviewed, CMP CBC are unremarkable, glucose is normal.  Medically cleared for psychiatric consultation  Consulted TTS and psychiatry   Dr. Weber Cooks of psychiatry has seen the patient, he will admit  FINAL CLINICAL IMPRESSION(S) / ED DIAGNOSES   Final diagnoses:  Suicidal ideation     Rx / DC Orders   ED Discharge Orders     None        Note:  This document was prepared using Dragon voice recognition software and may include unintentional dictation errors.   Lavonia Drafts, MD 05/12/22 [phone]

## 2022-05-13 DIAGNOSIS — F332 Major depressive disorder, recurrent severe without psychotic features: Secondary | ICD-10-CM | POA: Diagnosis not present

## 2022-05-13 MED ORDER — ADULT MULTIVITAMIN W/MINERALS CH
1.0000 | ORAL_TABLET | Freq: Every day | ORAL | Status: DC
  Administered 2022-05-14 – 2022-05-16 (×3): 1 via ORAL
  Filled 2022-05-13 (×3): qty 1

## 2022-05-13 MED ORDER — LOSARTAN POTASSIUM 25 MG PO TABS: 25.0000 mg | ORAL_TABLET | Freq: Every day | ORAL | Status: DC

## 2022-05-13 MED ORDER — LOSARTAN POTASSIUM 25 MG PO TABS
25.0000 mg | ORAL_TABLET | Freq: Every day | ORAL | Status: DC
  Administered 2022-05-13 – 2022-05-15 (×3): 25 mg via ORAL
  Filled 2022-05-13 (×3): qty 1

## 2022-05-13 MED ORDER — ENSURE ENLIVE PO LIQD
237.0000 mL | Freq: Two times a day (BID) | ORAL | Status: DC
  Administered 2022-05-14 – 2022-05-15 (×3): 237 mL via ORAL

## 2022-05-13 NOTE — Progress Notes (Signed)
Initial Nutrition Assessment  DOCUMENTATION CODES:   Not applicable  INTERVENTION:   Ensure Enlive po BID, each supplement provides 350 kcal and 20 grams of protein.  MVI po daily   NUTRITION DIAGNOSIS:   Inadequate oral intake related to social / environmental circumstances as evidenced by per patient/family report.  GOAL:   Patient will meet greater than or equal to 90% of their needs  MONITOR:   PO intake, Supplement acceptance  REASON FOR ASSESSMENT:   Malnutrition Screening Tool    ASSESSMENT:   76 y/o female with h/o MDD, anxiety, HLD, B12 deficiency and HTN who is admitted with depression.  Pt reports poor appetite and oral intake pta. Pt with improved oral intake in hospital. RD will add supplements and MVI to help pt meet her estimated needs. Per chart, pt appears weight stable pta.   Medications reviewed and include: oscal, remeron  Labs reviewed: K 3.6 wnl Wbc- 11.3(H)  Diet Order:   Diet Order             Diet regular Room service appropriate? Yes; Fluid consistency: Thin  Diet effective now                  EDUCATION NEEDS:   No education needs have been identified at this time  Skin:  Skin Assessment: Reviewed RN Assessment (erythema coccyx)  Last BM:  6/3  Height:   Ht Readings from Last 1 Encounters:  05/12/22 5\' 3"  (1.6 m)    Weight:   Wt Readings from Last 1 Encounters:  05/12/22 59 kg   BMI:  Body mass index is 23.03 kg/m.  Estimated Nutritional Needs:   Kcal:  1400-1600kcal/day  Protein:  70-80g/day  Fluid:  1.4-1.6L/day  07/12/22 MS, RD, LDN Please refer to [facility] for RD and/or RD on-call/weekend/after hours pager

## 2022-05-13 NOTE — Progress Notes (Signed)
Patient is seen in the dayroom with visitor at the beginning of the shift. She is alert and oriented x4. She is calm and cooperative. She denies SI, HI, and AVH. She denies pain. She said her anxiety is better than her depression. Pt stated that at home she takes Losartan at bedtime and Amlodipine in the morning and would like to do the same here while in the hospital. She voiced no other complaints to this Clinical research associate. She is safe on the unit at this time with q 15 minute safety checks in place.

## 2022-05-13 NOTE — Progress Notes (Signed)
[facility] MD Progress Note  05/13/2022 2:31 PM Angelica Robertson  MRN:  [id number] Subjective: Patient seen in follow-up for depression.  Patient reports her mood is still anxious but the depression is better.  Not feeling hopeless today.  Denies suicidal thoughts.  Had a couple questions about details of how her medicines were dosed.  No side effects from adding new medicines at this point.  Eating a little better Principal Problem: Severe recurrent major depression without psychotic features ([place]) Diagnosis: Principal Problem:   Severe recurrent major depression without psychotic features ([place])  Total Time spent with patient: 30 minutes  Past Psychiatric History: Past history of recurrent episodes of severe depression  Past Medical History:  Past Medical History:  Diagnosis Date   Depression 2000   Heart murmur    Hypercholesterolemia Osteoporosis   Hyperlipemia     Past Surgical History:  Procedure Laterality Date   ABDOMINAL HYSTERECTOMY  1980   COLONOSCOPY WITH PROPOFOL N/A 03/05/2018   Procedure: COLONOSCOPY WITH PROPOFOL;  Surgeon: Toledo, [name], MD;  Location: [facility] ENDOSCOPY;  Service: Endoscopy;  Laterality: N/A;   Family History:  Family History  Problem Relation Age of Onset   Heart disease Mother    Diabetes Mother    Heart disease Father    Diabetes Father    Colon cancer Other    Congestive Heart Failure Brother    Colon cancer Brother    Mental illness Paternal Aunt    BRCA 1/2 Neg Hx    Breast cancer Neg Hx    Cowden syndrome Neg Hx    DES usage Neg Hx    Endometrial cancer Neg Hx    Li-Fraumeni syndrome Neg Hx    Ovarian cancer Neg Hx    Family Psychiatric  History: See previous Social History:  Social History   Substance and Sexual Activity  Alcohol Use Yes   Alcohol/week: 0.0 standard drinks   Comment: occasionally - socially during the summer a glass of wine or beer     Social History   Substance and Sexual Activity  Drug Use No    Social History    Socioeconomic History   Marital status: Married    Spouse name: robert   Number of children: 2   Years of education: Not on file   Highest education level: Not on file  Occupational History   Not on file  Tobacco Use   Smoking status: Never   Smokeless tobacco: Never  Vaping Use   Vaping Use: Never used  Substance and Sexual Activity   Alcohol use: Yes    Alcohol/week: 0.0 standard drinks    Comment: occasionally - socially during the summer a glass of wine or beer   Drug use: No   Sexual activity: Yes  Other Topics Concern   Not on file  Social History Narrative   Not on file   Social Determinants of Health   Financial Resource Strain: Not on file  Food Insecurity: Not on file  Transportation Needs: Not on file  Physical Activity: Not on file  Stress: Not on file  Social Connections: Not on file   Additional Social History:                         Sleep: Fair  Appetite:  Fair  Current Medications: Current Facility-Administered Medications  Medication Dose Route Frequency Provider Last Rate Last Admin   acetaminophen (TYLENOL) tablet 650 mg  650 mg Oral Q6H PRN  Vanshika Jastrzebski, Madie Reno, MD       alum & mag hydroxide-simeth (MAALOX/MYLANTA) 200-200-20 MG/5ML suspension 30 mL  30 mL Oral Q4H PRN Avalie Oconnor, Madie Reno, MD       amLODipine (NORVASC) tablet 2.5 mg  2.5 mg Oral Daily Duilio Heritage T, MD   2.5 mg at 05/13/22 0901   buPROPion (WELLBUTRIN XL) 24 hr tablet 150 mg  150 mg Oral Daily Emilea Goga, Madie Reno, MD   150 mg at 05/13/22 0901   calcium carbonate (OS-CAL - dosed in mg of elemental calcium) tablet 625 mg  625 mg Oral BID WC Bambi Fehnel, Madie Reno, MD   625 mg at 05/13/22 0901   hydrOXYzine (ATARAX) tablet 10 mg  10 mg Oral TID PRN Kolson Chovanec, Madie Reno, MD       losartan (COZAAR) tablet 25 mg  25 mg Oral QHS Delaine Canter T, MD       magnesium hydroxide (MILK OF MAGNESIA) suspension 30 mL  30 mL Oral Daily PRN Talayla Doyel, Madie Reno, MD       mirtazapine (REMERON) tablet 30 mg  30  mg Oral QHS Hagen Bohorquez T, MD   30 mg at 05/12/22 2149   rosuvastatin (CRESTOR) tablet 20 mg  20 mg Oral QHS Ashlinn Hemrick T, MD   20 mg at 05/12/22 2150   traZODone (DESYREL) tablet 50 mg  50 mg Oral QHS PRN Shayla Heming, Madie Reno, MD        Lab Results:  Results for orders placed or performed during the hospital encounter of 05/12/22 (from the past 48 hour(s))  Comprehensive metabolic panel     Status: Abnormal   Collection Time: 05/12/22  9:17 AM  Result Value Ref Range   Sodium 136 135 - 145 mmol/L   Potassium 3.6 3.5 - 5.1 mmol/L   Chloride 101 98 - 111 mmol/L   CO2 26 22 - 32 mmol/L   Glucose, Bld 203 (H) 70 - 99 mg/dL    Comment: Glucose reference range applies only to samples taken after fasting for at least 8 hours.   BUN 11 8 - 23 mg/dL   Creatinine, Ser 0.72 0.44 - 1.00 mg/dL   Calcium 10.0 8.9 - 10.3 mg/dL   Total Protein 7.4 6.5 - 8.1 g/dL   Albumin 4.5 3.5 - 5.0 g/dL   AST 28 15 - 41 U/L   ALT 24 0 - 44 U/L   Alkaline Phosphatase 44 38 - 126 U/L   Total Bilirubin 0.8 0.3 - 1.2 mg/dL   GFR, Estimated >60 >60 mL/min    Comment: (NOTE) Calculated using the CKD-EPI Creatinine Equation (2021)    Anion gap 9 5 - 15    Comment: Performed at [facility], [address] [place], [place] [address]  Ethanol     Status: None   Collection Time: 05/12/22  9:17 AM  Result Value Ref Range   Alcohol, Ethyl (B) <10 <10 mg/dL    Comment: (NOTE) Lowest detectable limit for serum alcohol is 10 mg/dL.  For medical purposes only. Performed at [facility], [address] [place], [place] [address]   Salicylate level     Status: Abnormal   Collection Time: 05/12/22  9:17 AM  Result Value Ref Range   Salicylate Lvl <5.6 (L) 7.0 - 30.0 mg/dL    Comment: Performed at [facility], [address] [place], [place] [address]  Acetaminophen level     Status: Abnormal   Collection Time: 05/12/22  9:17  AM  Result Value Ref Range   Acetaminophen (Tylenol),  Serum <10 (L) 10 - 30 ug/mL    Comment: (NOTE) Therapeutic concentrations vary significantly. A range of 10-30 ug/mL  may be an effective concentration for many patients. However, some  are best treated at concentrations outside of this range. Acetaminophen concentrations >150 ug/mL at 4 hours after ingestion  and >50 ug/mL at 12 hours after ingestion are often associated with  toxic reactions.  Performed at [facility], [address] [place], [place] [address]   cbc     Status: Abnormal   Collection Time: 05/12/22  9:17 AM  Result Value Ref Range   WBC 11.3 (H) 4.0 - 10.5 K/uL   RBC 4.61 3.87 - 5.11 MIL/uL   Hemoglobin 13.6 12.0 - 15.0 g/dL   HCT 40.5 36.0 - 46.0 %   MCV 87.9 80.0 - 100.0 fL   MCH 29.5 26.0 - 34.0 pg   MCHC 33.6 30.0 - 36.0 g/dL   RDW 12.7 11.5 - 15.5 %   Platelets 327 150 - 400 K/uL   nRBC 0.0 0.0 - 0.2 %    Comment: Performed at [facility], [address], [place], [place] [address]  Urine Drug Screen, Qualitative     Status: None   Collection Time: 05/12/22 11:57 AM  Result Value Ref Range   Tricyclic, Ur Screen NONE DETECTED NONE DETECTED   Amphetamines, Ur Screen NONE DETECTED NONE DETECTED   MDMA (Ecstasy)Ur Screen NONE DETECTED NONE DETECTED   Cocaine Metabolite,Ur [place] NONE DETECTED NONE DETECTED   Opiate, Ur Screen NONE DETECTED NONE DETECTED   Phencyclidine (PCP) Ur S NONE DETECTED NONE DETECTED   Cannabinoid 50 Ng, Ur [place] NONE DETECTED NONE DETECTED   Barbiturates, Ur Screen NONE DETECTED NONE DETECTED   Benzodiazepine, Ur Scrn NONE DETECTED NONE DETECTED   Methadone Scn, Ur NONE DETECTED NONE DETECTED    Comment: (NOTE) Tricyclics + metabolites, urine    Cutoff 1000 ng/mL Amphetamines + metabolites, urine  Cutoff 1000 ng/mL MDMA (Ecstasy), urine              Cutoff 500 ng/mL Cocaine Metabolite, urine          Cutoff 300 ng/mL Opiate + metabolites, urine        Cutoff 300 ng/mL Phencyclidine (PCP), urine         Cutoff 25  ng/mL Cannabinoid, urine                 Cutoff 50 ng/mL Barbiturates + metabolites, urine  Cutoff 200 ng/mL Benzodiazepine, urine              Cutoff 200 ng/mL Methadone, urine                   Cutoff 300 ng/mL  The urine drug screen provides only a preliminary, unconfirmed analytical test result and should not be used for non-medical purposes. Clinical consideration and professional judgment should be applied to any positive drug screen result due to possible interfering substances. A more specific alternate chemical method must be used in order to obtain a confirmed analytical result. Gas chromatography / mass spectrometry (GC/MS) is the preferred confirm atory method. Performed at [facility], [address] [place], [place] [address]   Resp Panel by RT-PCR (Flu A&B, Covid) Anterior Nasal Swab     Status: None   Collection Time: 05/12/22 11:57 AM   Specimen: Anterior Nasal Swab  Result Value Ref Range  SARS Coronavirus 2 by RT PCR NEGATIVE NEGATIVE    Comment: (NOTE) SARS-CoV-2 target nucleic acids are NOT DETECTED.  The SARS-CoV-2 RNA is generally detectable in upper respiratory specimens during the acute phase of infection. The lowest concentration of SARS-CoV-2 viral copies this assay can detect is 138 copies/mL. A negative result does not preclude SARS-Cov-2 infection and should not be used as the sole basis for treatment or other patient management decisions. A negative result may occur with  improper specimen collection/handling, submission of specimen other than nasopharyngeal swab, presence of viral mutation(s) within the areas targeted by this assay, and inadequate number of viral copies(<138 copies/mL). A negative result must be combined with clinical observations, patient history, and epidemiological information. The expected result is Negative.  Fact Sheet for Patients:  EntrepreneurPulse.com.au  Fact Sheet for Healthcare  Providers:  IncredibleEmployment.be  This test is no t yet approved or cleared by the [place] FDA and  has been authorized for detection and/or diagnosis of SARS-CoV-2 by FDA under an Emergency Use Authorization (EUA). This EUA will remain  in effect (meaning this test can be used) for the duration of the COVID-19 declaration under Section 564(b)(1) of the Act, 21 U.S.C.section 360bbb-3(b)(1), unless the authorization is terminated  or revoked sooner.       Influenza A by PCR NEGATIVE NEGATIVE   Influenza B by PCR NEGATIVE NEGATIVE    Comment: (NOTE) The Xpert Xpress SARS-CoV-2/FLU/RSV plus assay is intended as an aid in the diagnosis of influenza from Nasopharyngeal swab specimens and should not be used as a sole basis for treatment. Nasal washings and aspirates are unacceptable for Xpert Xpress SARS-CoV-2/FLU/RSV testing.  Fact Sheet for Patients: EntrepreneurPulse.com.au  Fact Sheet for Healthcare Providers: IncredibleEmployment.be  This test is not yet approved or cleared by the [place] FDA and has been authorized for detection and/or diagnosis of SARS-CoV-2 by FDA under an Emergency Use Authorization (EUA). This EUA will remain in effect (meaning this test can be used) for the duration of the COVID-19 declaration under Section 564(b)(1) of the Act, 21 U.S.C. section 360bbb-3(b)(1), unless the authorization is terminated or revoked.  Performed at [facility], [address] [place], [place] [address]     Blood Alcohol level:  Lab Results  Component Value Date   [facility] <10 05/12/2022   ETH <10 45/80/9983    Metabolic Disorder Labs: Lab Results  Component Value Date   HGBA1C 6.3 03/21/2022   No results found for: PROLACTIN Lab Results  Component Value Date   CHOL 152 03/21/2022   TRIG 136.0 03/21/2022   HDL 57.30 03/21/2022   CHOLHDL 3 03/21/2022   VLDL 27.2 03/21/2022   LDLCALC 67  03/21/2022   LDLCALC 71 11/18/2021    Physical Findings: AIMS:  , ,  ,  ,    CIWA:    COWS:     Musculoskeletal: Strength & Muscle Tone: within normal limits Gait & Station: normal Patient leans: N/A  Psychiatric Specialty Exam:  Presentation  General Appearance: No data recorded Eye Contact:No data recorded Speech:No data recorded Speech Volume:No data recorded Handedness:No data recorded  Mood and Affect  Mood:No data recorded Affect:No data recorded  Thought Process  Thought Processes:No data recorded Descriptions of Associations:No data recorded Orientation:No data recorded Thought Content:No data recorded History of Schizophrenia/Schizoaffective disorder:No  Duration of Psychotic Symptoms:No data recorded Hallucinations:No data recorded Ideas of Reference:No data recorded Suicidal Thoughts:No data recorded Homicidal Thoughts:No data recorded  Sensorium  Memory:No data recorded Judgment:No data  recorded Insight:No data recorded  Executive Functions  Concentration:No data recorded Attention Span:No data recorded [place] recorded Language:No data recorded  Psychomotor Activity  Psychomotor Activity:No data recorded  Assets  Assets:No data recorded  Sleep  Sleep:No data recorded   Physical Exam: Physical Exam Vitals and nursing note reviewed.  Constitutional:      Appearance: Normal appearance.  HENT:     Head: Normocephalic and atraumatic.     Mouth/Throat:     Pharynx: Oropharynx is clear.  Eyes:     Pupils: Pupils are equal, round, and reactive to light.  Cardiovascular:     Rate and Rhythm: Normal rate and regular rhythm.  Pulmonary:     Effort: Pulmonary effort is normal.     Breath sounds: Normal breath sounds.  Abdominal:     General: Abdomen is flat.     Palpations: Abdomen is soft.  Musculoskeletal:        General: Normal range of motion.  Skin:    General: Skin is warm and dry.   Neurological:     General: No focal deficit present.     Mental Status: She is alert. Mental status is at baseline.  Psychiatric:        Attention and Perception: Attention normal.        Mood and Affect: Mood is anxious.        Behavior: Behavior is agitated.        Thought Content: Thought content normal. Thought content does not include suicidal ideation.        Cognition and Memory: Cognition normal.   Review of Systems  Constitutional: Negative.   HENT: Negative.    Eyes: Negative.   Respiratory: Negative.    Cardiovascular: Negative.   Gastrointestinal: Negative.   Musculoskeletal: Negative.   Skin: Negative.   Neurological: Negative.   Psychiatric/Behavioral:  Negative for suicidal ideas. The patient is nervous/anxious.   Blood pressure 129/77, pulse 86, temperature 98 F (36.7 C), resp. rate 18, height 5' 3"  (1.6 m), weight 59 kg, last menstrual period 11/27/1979, SpO2 98 %. Body mass index is 23.03 kg/m.   Treatment Plan Summary: Medication management and Plan continue current medication.  Switched it up so that she would take her losartan at the right time of day as well as the Crestor.  Encourage patient and her efforts to go outside socialize be more active.  Alethia Berthold, MD 05/13/2022, 2:31 PM

## 2022-05-13 NOTE — Progress Notes (Signed)
Patient is calm and cooperative with assessment. She denies SI, HI, and AVH. Patient endorses high anxiety and some nausea, which she attributes to coming off of Cymbalta and being nervous about starting Wellbutrin. Patient compliant with morning medications, but said she usually takes Losartan at bedtime. Losartan was not given and MD notified about request to take it at bedtime. Patient is active on the unit and is interacting appropriately with staff and other patients. She is seen in the dayroom eating breakfast, watching TV, and reading. Patient remains safe on the unit at this time.
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
## 2022-05-14 MED ORDER — CALCIUM CARBONATE 1250 (500 CA) MG PO TABS
0.5000 | ORAL_TABLET | Freq: Two times a day (BID) | ORAL | Status: DC
  Administered 2022-05-14 – 2022-05-16 (×4): 250 mg via ORAL
  Filled 2022-05-14 (×4): qty 0.5

## 2022-05-14 NOTE — Group Note (Signed)
[facility] LCSW Group Therapy Note   Group Date: 05/14/2022 Start Time: 1430 End Time: 1530  Type of Therapy/Topic:  Group Therapy:  Feelings about Diagnosis  Participation Level:  Active    Description of Group:    This group will allow patients to explore their thoughts and feelings about diagnoses they have received. Patients will be guided to explore their level of understanding and acceptance of these diagnoses. Facilitator will encourage patients to process their thoughts and feelings about the reactions of others to their diagnosis, and will guide patients in identifying ways to discuss their diagnosis with significant others in their lives. This group will be process-oriented, with patients participating in exploration of their own experiences as well as giving and receiving support and challenge from other group members.   Therapeutic Goals: 1. Patient will demonstrate understanding of diagnosis as evidence by identifying two or more symptoms of the disorder:  2. Patient will be able to express two feelings regarding the diagnosis 3. Patient will demonstrate ability to communicate their needs through discussion and/or role plays  Summary of Patient Progress: Patient was present for the entirety of the group session. Patient was an active listener and participated in the topic of discussion, provided helpful advice to others, and added nuance to topic of conversation. Patient shared how the stigma of a mental health diagnosis has impacted her interactions with others. Patient shared that having a mental health diagnosis can be isolating but reports that she is open with her husband and close friends and family members about her mental health challenges. Patient shared that this is her 4th time at an inpatient psychiatric facility and she is feeling hopeful that the medication changes made during her current hospitalization will help improve her symptoms of depression.       Therapeutic  Modalities:   Cognitive Behavioral Therapy Brief Therapy Feelings Identification    Ileana Ladd Urijah Arko, LCSWA

## 2022-05-14 NOTE — Progress Notes (Signed)
Patient is seen in the dayroom with a visitor at the beginning of the shift. She is calm, cooperative, and AAOx4. She denies SI, HI, and AVH. Pt is in no current distress. She is safe on the unit at this time q 15 min safety checks in place.

## 2022-05-15 DIAGNOSIS — F332 Major depressive disorder, recurrent severe without psychotic features: Secondary | ICD-10-CM | POA: Diagnosis not present

## 2022-05-15 NOTE — Progress Notes (Signed)
Patient denies SI,HI, and A/V/H with no plan/intent but does state feeling depressed. Patient stated she had interrupted sleep last night due to noise level. Patient currently denies any pain and has been med compliant. Patient has been refusing her ensures due to stating she is pre-diabetic and does not like how much sugar is in the ensure and does not wish to keep taking them. Patient joined groups and has been observed in dayroom socializing appropriately with peers. No further concerns at this time.

## 2022-05-15 NOTE — Progress Notes (Signed)
Recreation Therapy Notes  INPATIENT RECREATION TR PLAN  Patient Details Name: ALACIA PASTOREK MRN: [id number] DOB: [birth year] Today's Date: 05/15/2022  Rec Therapy Plan Is patient appropriate for Therapeutic Recreation?: Yes Treatment times per week: at least 3 Estimated Length of Stay: 5-7 days TR Treatment/Interventions: Group participation (Comment)  Discharge Criteria Pt will be discharged from therapy if:: Discharged Treatment plan/goals/alternatives discussed and agreed upon by:: Patient/family  Discharge Summary     Dwayna Kentner 05/15/2022, 2:25 PM

## 2022-05-15 NOTE — Progress Notes (Signed)
Recreation Therapy Notes  INPATIENT RECREATION THERAPY ASSESSMENT  Patient Details Name: Angelica Robertson MRN: [id number] DOB: 01-05-1946 Today's Date: 05/15/2022       Information Obtained From: Patient  Able to Participate in Assessment/Interview: Yes  Patient Presentation: Responsive  Reason for Admission (Per Patient): Active Symptoms (Depression)  Patient Stressors: Family  Coping Skills:   Prayer, Exercise, Other (Comment) (Heating pad)  Leisure Interests (2+):  Exercise - Walking, Sports - Dance, Music - Listen, Garment/textile technologist - Travel (Comment), Community - Counselling psychologist, Social - Family  Frequency of Recreation/Participation: Chief Executive Officer of Community Resources:  Yes  Community Resources:  [employer], Engineer, petroleum  Current Use: Yes  If no, Barriers?:    Expressed Interest in [employer] Information: Yes  [place]idence:  [place]  Patient Main Form of Transportation: Set designer  Patient Strengths:  Caring  Patient Identified Areas of Improvement:  Teach husband to cook  Patient Goal for Hospitalization:  To get medication right  Current SI (including self-harm):  No  Current HI:  No  Current AVH: No  Staff Intervention Plan: Group Attendance, Collaborate with Interdisciplinary Treatment Team  Consent to Intern Participation: N/A  Kevion Fatheree 05/15/2022, 2:25 PM

## 2022-05-15 NOTE — Plan of Care (Signed)
  Problem: Education: Goal: Verbalization of understanding the information provided will improve Outcome: Progressing   Problem: Coping: Goal: Ability to verbalize frustrations and anger appropriately will improve Outcome: Progressing   Problem: Safety: Goal: Periods of time without injury will increase Outcome: Progressing   

## 2022-05-15 NOTE — Progress Notes (Signed)
[facility] MD Progress Note  05/15/2022 2:16 PM Angelica Robertson  MRN:  [id number] Subjective: Patient seen in follow-up for depression after discussing with nursing and social work.  Client reports doing "much better", low level of depression, anxiety is mild to moderate, no panic attacks.  Denies side effects from her medications.  Appetite is fair, I order and eat it.  Sleep was interrupted by noises on the unit, "too loud".  She is busy in the dayroom socializing and playing solitaire, reading devotions, etc.  She feels ready to return home tomorrow, discharge planning in place.  Principal Problem: MDD (major depressive disorder), recurrent severe, without psychosis ([place]) Diagnosis: Principal Problem:   MDD (major depressive disorder), recurrent severe, without psychosis ([place])  Total Time spent with patient: 30 minutes  Past Psychiatric History: Past history of recurrent episodes of severe depression  Past Medical History:  Past Medical History:  Diagnosis Date   Depression 2000   Heart murmur    Hypercholesterolemia Osteoporosis   Hyperlipemia     Past Surgical History:  Procedure Laterality Date   ABDOMINAL HYSTERECTOMY  1980   COLONOSCOPY WITH PROPOFOL N/A 03/05/2018   Procedure: COLONOSCOPY WITH PROPOFOL;  Surgeon: Toledo, [name], MD;  Location: [facility] ENDOSCOPY;  Service: Endoscopy;  Laterality: N/A;   Family History:  Family History  Problem Relation Age of Onset   Heart disease Mother    Diabetes Mother    Heart disease Father    Diabetes Father    Colon cancer Other    Congestive Heart Failure Brother    Colon cancer Brother    Mental illness Paternal Aunt    BRCA 1/2 Neg Hx    Breast cancer Neg Hx    Cowden syndrome Neg Hx    DES usage Neg Hx    Endometrial cancer Neg Hx    Li-Fraumeni syndrome Neg Hx    Ovarian cancer Neg Hx    Family Psychiatric  History: See previous Social History:  Social History   Substance and Sexual Activity  Alcohol Use Yes    Alcohol/week: 0.0 standard drinks   Comment: occasionally - socially during the summer a glass of wine or beer     Social History   Substance and Sexual Activity  Drug Use No    Social History   Socioeconomic History   Marital status: Married    Spouse name: robert   Number of children: 2   Years of education: Not on file   Highest education level: Not on file  Occupational History   Not on file  Tobacco Use   Smoking status: Never   Smokeless tobacco: Never  Vaping Use   Vaping Use: Never used  Substance and Sexual Activity   Alcohol use: Yes    Alcohol/week: 0.0 standard drinks    Comment: occasionally - socially during the summer a glass of wine or beer   Drug use: No   Sexual activity: Yes  Other Topics Concern   Not on file  Social History Narrative   Not on file   Social Determinants of Health   Financial Resource Strain: Not on file  Food Insecurity: Not on file  Transportation Needs: Not on file  Physical Activity: Not on file  Stress: Not on file  Social Connections: Not on file   Additional Social History:                         Sleep: Fair  Appetite:  Fair  Current Medications: Current Facility-Administered Medications  Medication Dose Route Frequency Provider Last Rate Last Admin   acetaminophen (TYLENOL) tablet 650 mg  650 mg Oral Q6H PRN Clapacs, Madie Reno, MD   650 mg at 05/14/22 2254   alum & mag hydroxide-simeth (MAALOX/MYLANTA) 200-200-20 MG/5ML suspension 30 mL  30 mL Oral Q4H PRN Clapacs, Madie Reno, MD       amLODipine (NORVASC) tablet 2.5 mg  2.5 mg Oral Daily Clapacs, [name], MD   2.5 mg at 05/15/22 8185   buPROPion (WELLBUTRIN XL) 24 hr tablet 150 mg  150 mg Oral Daily Clapacs, [name], MD   150 mg at 05/15/22 6314   calcium carbonate (OS-CAL - dosed in mg of elemental calcium) tablet 250 mg of elemental calcium  0.5 tablet Oral BID WC Benita Gutter, RPH   250 mg of elemental calcium at 05/15/22 0900   feeding supplement (ENSURE  ENLIVE / ENSURE PLUS) liquid 237 mL  237 mL Oral BID BM Clapacs, [name], MD   237 mL at 05/15/22 0904   hydrOXYzine (ATARAX) tablet 10 mg  10 mg Oral TID PRN Clapacs, Madie Reno, MD       losartan (COZAAR) tablet 25 mg  25 mg Oral QHS Clapacs, [name], MD   25 mg at 05/14/22 2117   magnesium hydroxide (MILK OF MAGNESIA) suspension 30 mL  30 mL Oral Daily PRN Clapacs, [name], MD       mirtazapine (REMERON) tablet 30 mg  30 mg Oral QHS Clapacs, [name], MD   30 mg at 05/14/22 2119   multivitamin with minerals tablet 1 tablet  1 tablet Oral Daily Clapacs, Madie Reno, MD   1 tablet at 05/15/22 9702   rosuvastatin (CRESTOR) tablet 20 mg  20 mg Oral QHS Clapacs, [name], MD   20 mg at 05/14/22 2117   traZODone (DESYREL) tablet 50 mg  50 mg Oral QHS PRN Clapacs, Madie Reno, MD        Lab Results:  No results found for this or any previous visit (from the past 48 hour(s)).   Blood Alcohol level:  Lab Results  Component Value Date   ETH <10 05/12/2022   ETH <10 63/78/5885    Metabolic Disorder Labs: Lab Results  Component Value Date   HGBA1C 6.3 03/21/2022   No results found for: PROLACTIN Lab Results  Component Value Date   CHOL 152 03/21/2022   TRIG 136.0 03/21/2022   HDL 57.30 03/21/2022   CHOLHDL 3 03/21/2022   VLDL 27.2 03/21/2022   LDLCALC 67 03/21/2022   LDLCALC 71 11/18/2021     Musculoskeletal: Strength & Muscle Tone: within normal limits Gait & Station: normal Patient leans: N/A  Psychiatric Specialty Exam: Physical Exam Vitals and nursing note reviewed.  Constitutional:      Appearance: Normal appearance.  HENT:     Head: Normocephalic and atraumatic.  Pulmonary:     Effort: Pulmonary effort is normal.  Musculoskeletal:        General: Normal range of motion.  Neurological:     General: No focal deficit present.     Mental Status: She is alert and oriented to person, place, and time. Mental status is at baseline.  Psychiatric:        Attention and Perception: Attention  normal.        Mood and Affect: Mood is anxious and depressed.        Speech: Speech normal.  Behavior: Behavior normal. Behavior is cooperative.        Thought Content: Thought content normal. Thought content does not include suicidal ideation.        Cognition and Memory: Cognition normal.        Judgment: Judgment normal.    Review of Systems  Constitutional: Negative.   HENT: Negative.    Eyes: Negative.   Respiratory: Negative.    Cardiovascular: Negative.   Gastrointestinal: Negative.   Musculoskeletal: Negative.   Skin: Negative.   Neurological: Negative.   Psychiatric/Behavioral:  Positive for depression. Negative for suicidal ideas. The patient is nervous/anxious.    Blood pressure (!) 146/76, pulse (!) 104, temperature 98 F (36.7 C), resp. rate 18, height 5' 3" (1.6 m), weight 59 kg, last menstrual period 11/27/1979, SpO2 98 %.Body mass index is 23.03 kg/m.  General Appearance: Casual  Eye Contact:  Good  Speech:  Clear and Coherent and Normal Rate  Volume:  Normal  Mood:  Anxious and Depressed  Affect:  Congruent  Thought Process:  Coherent and Descriptions of Associations: Intact  Orientation:  Full (Time, Place, and Person)  Thought Content:  WDL and Logical  Suicidal Thoughts:  No  Homicidal Thoughts:  No  Memory:  Immediate;   Good Recent;   Good Remote;   Good  Judgement:  Good  Insight:  Good  Psychomotor Activity:  Normal  Concentration:  Concentration: Good and Attention Span: Good  Recall:  Good  Fund of Knowledge:  Good  Language:  Good  Akathisia:  No  Handed:  Right  AIMS (if indicated):     Assets:  Housing Leisure Time Physical Health Resilience Social Support  ADL's:  Intact  Cognition:  WNL  Sleep:         Physical Exam: Physical Exam Vitals and nursing note reviewed.  Constitutional:      Appearance: Normal appearance.  HENT:     Head: Normocephalic and atraumatic.  Pulmonary:     Effort: Pulmonary effort is normal.   Musculoskeletal:        General: Normal range of motion.  Neurological:     General: No focal deficit present.     Mental Status: She is alert and oriented to person, place, and time. Mental status is at baseline.  Psychiatric:        Attention and Perception: Attention normal.        Mood and Affect: Mood is anxious and depressed.        Speech: Speech normal.        Behavior: Behavior normal. Behavior is cooperative.        Thought Content: Thought content normal. Thought content does not include suicidal ideation.        Cognition and Memory: Cognition normal.        Judgment: Judgment normal.   Review of Systems  Constitutional: Negative.   HENT: Negative.    Eyes: Negative.   Respiratory: Negative.    Cardiovascular: Negative.   Gastrointestinal: Negative.   Musculoskeletal: Negative.   Skin: Negative.   Neurological: Negative.   Psychiatric/Behavioral:  Positive for depression. Negative for suicidal ideas. The patient is nervous/anxious.   Blood pressure (!) 146/76, pulse (!) 104, temperature 98 F (36.7 C), resp. rate 18, height 5' 3" (1.6 m), weight 59 kg, last menstrual period 11/27/1979, SpO2 98 %. Body mass index is 23.03 kg/m.   Treatment Plan Summary: Major depressive disorder, recurrent, severe: Continue Wellbutrin 150 mg daily Discharge tomorrow  Anxiety: Continue  hydroxyzine 10 mg TID PRN  Insomnia: Continue Trazodone 50 mg at bedtime PRN Continue Remeron 30 mg at bedtime  Waylan Boga, NP 05/15/2022, 2:16 PM

## 2022-05-15 NOTE — [facility] Suicide Risk Assessment (Signed)
[facility] INPATIENT:  Family/Significant Other Suicide Prevention Education  Suicide Prevention Education:  Education Completed; Angelica Robertson, pt's husband, [phone],  has been identified by the patient as the family member/significant other with whom the patient will be residing, and identified as the person(s) who will aid the patient in the event of a mental health crisis (suicidal ideations/suicide attempt).  With written consent from the patient, the family member/significant other has been provided the following suicide prevention education, prior to the and/or following the discharge of the patient.  The suicide prevention education provided includes the following: Suicide risk factors Suicide prevention and interventions National Suicide Hotline telephone number [facility] assessment telephone number [facility] Emergency Assistance 911 [facility] and/or Residential Mobile Crisis Unit telephone number  Request made of family/significant other to: Remove weapons (e.g., guns, rifles, knives), all items previously/currently identified as safety concern.   Remove drugs/medications (over-the-counter, prescriptions, illicit drugs), all items previously/currently identified as a safety concern.  The family member/significant other verbalizes understanding of the suicide prevention education information provided.  The family member/significant other agrees to remove the items of safety concern listed above.  Angelica Robertson 05/15/2022, 11:40 AM

## 2022-05-15 NOTE — [facility] Group Notes (Signed)
10:30 AM - 11:10 AM  Group: Chair Yoga  PT participated , fully engaged, calm, very thankful for the class

## 2022-05-15 NOTE — Progress Notes (Signed)
Recreation Therapy Notes  Date: 05/15/2022   Time: 1:30 pm    Location: Craft room     Behavioral response: Appropriate   Intervention Topic: Emotions    Discussion/Intervention:  Group content on today was focused on emotions. The group identified what emotions are and why it is important to have emotions. Patients expressed some positive and negative emotions. Individuals gave some past experiences on how they normally dealt with emotions in the past. The group described some positive ways to deal with emotions in the future. Patients participated in the intervention "Name the Angelica Robertson" where individuals were given a chance to experience different emotions.  Clinical Observations/Feedback: Patient came to group and defined emotions as how feel in your soul. She stated that common emotions for her is joy and peace that come from belief in [place]. Participant explained that family and current situation impact emotions. Individual was social with peers and staff while participating in the intervention.  Angelica Robertson LRT/CTRS           [name] 05/15/2022 2:53 PM

## 2022-05-15 NOTE — Progress Notes (Signed)
Patient is seen in the dayroom with visitor at the beginning of the shift. She is calm, cooperative, and AAOx4. Pt denies SI, HI, and AVH. She voiced no complaints of anxiety or depression. She stated she is sleeping better. She is medication complaint. Pt said she had restless legs when attempting to go to sleep which kept her up. Tylenol 650 mg administered as per MAR orders which was effective. She is safe on the unit at this time. Q 15 min safety checks in place.

## 2022-05-15 NOTE — BH IP Treatment Plan (Unsigned)
Interdisciplinary Treatment and Diagnostic Plan Update  05/15/2022 Time of Session: 9:30AM Angelica Robertson MRN: [id number]  Principal Diagnosis: Severe recurrent major depression without psychotic features [facility])  Secondary Diagnoses: Principal Problem:   Severe recurrent major depression without psychotic features (HCC)   Current Medications:  Current Facility-Administered Medications  Medication Dose Route Frequency Provider Last Rate Last Admin   acetaminophen (TYLENOL) tablet 650 mg  650 mg Oral Q6H PRN Clapacs, [name], MD   650 mg at 05/14/22 2254   alum & mag hydroxide-simeth (MAALOX/MYLANTA) 200-200-20 MG/5ML suspension 30 mL  30 mL Oral Q4H PRN Clapacs, [name], MD       amLODipine (NORVASC) tablet 2.5 mg  2.5 mg Oral Daily Clapacs, [name], MD   2.5 mg at 05/15/22 4098   buPROPion (WELLBUTRIN XL) 24 hr tablet 150 mg  150 mg Oral Daily Clapacs, [name], MD   150 mg at 05/15/22 1191   calcium carbonate (OS-CAL - dosed in mg of elemental calcium) tablet 250 mg of elemental calcium  0.5 tablet Oral BID WC [name], RPH   250 mg of elemental calcium at 05/15/22 0900   feeding supplement (ENSURE ENLIVE / ENSURE PLUS) liquid 237 mL  237 mL Oral BID BM Clapacs, [name], MD   237 mL at 05/15/22 0904   hydrOXYzine (ATARAX) tablet 10 mg  10 mg Oral TID PRN Clapacs, [name], MD       losartan (COZAAR) tablet 25 mg  25 mg Oral QHS Clapacs, [name], MD   25 mg at 05/14/22 2117   magnesium hydroxide (MILK OF MAGNESIA) suspension 30 mL  30 mL Oral Daily PRN Clapacs, [name], MD       mirtazapine (REMERON) tablet 30 mg  30 mg Oral QHS Clapacs, [name], MD   30 mg at 05/14/22 2119   multivitamin with minerals tablet 1 tablet  1 tablet Oral Daily Clapacs, [name], MD   1 tablet at 05/15/22 4782   rosuvastatin (CRESTOR) tablet 20 mg  20 mg Oral QHS Clapacs, [name], MD   20 mg at 05/14/22 2117   traZODone (DESYREL) tablet 50 mg  50 mg Oral QHS PRN Clapacs, [name], MD       PTA Medications: Medications  Prior to Admission  Medication Sig Dispense Refill Last Dose   amLODipine (NORVASC) 2.5 MG tablet Take 1 tablet (2.5 mg total) by mouth daily. In am 90 tablet 3    calcium carbonate (OS-CAL) 600 MG TABS tablet Take 600 mg by mouth 2 (two) times daily with a meal.      Cyanocobalamin (B-12 PO) Take by mouth daily.      DULoxetine (CYMBALTA) 20 MG capsule Take 1 capsule (20 mg total) by mouth daily. 30 capsule 1    Glucosamine-Chondroitin 250-200 MG CAPS Take 1 tablet by mouth daily. TAKES 25 MCG      losartan (COZAAR) 25 MG tablet Take 1 tablet (25 mg total) by mouth daily. 90 tablet 1    mirtazapine (REMERON) 15 MG tablet TAKE 1&1/2 TABLETS BY MOUTH AT BEDTIME (Patient taking differently: Take 22.5 mg by mouth at bedtime. TAKE 22.5 MG (ONE AND ONE-HALF TABLETS) BY MOUTH AT BEDTIME) 135 tablet 1    rosuvastatin (CRESTOR) 20 MG tablet TAKE 1 TABLET BY MOUTH AT BEDTIME (Patient taking differently: Take 20 mg by mouth at bedtime.) 90 tablet 3    VITAMIN D PO Take by mouth daily.  Patient Stressors: Other: ongoing depression. Meds not working.     Patient Strengths: Active sense of humor  Communication skills  General fund of knowledge  Religious Affiliation   Treatment Modalities: Medication Management, Group therapy, Case management,  1 to 1 session with clinician, Psychoeducation, Recreational therapy.   Physician Treatment Plan for Primary Diagnosis: Severe recurrent major depression without psychotic features (HCC) Long Term Goal(s): Improvement in symptoms so as ready for discharge   Short Term Goals: Compliance with prescribed medications will improve Ability to verbalize feelings will improve Ability to disclose and discuss suicidal ideas Ability to demonstrate self-control will improve  Medication Management: Evaluate patient's response, side effects, and tolerance of medication regimen.  Therapeutic Interventions: 1 to 1 sessions, Unit Group sessions and Medication  administration.  Evaluation of Outcomes: Adequate for Discharge  Physician Treatment Plan for Secondary Diagnosis: Principal Problem:   Severe recurrent major depression without psychotic features (HCC)  Long Term Goal(s): Improvement in symptoms so as ready for discharge   Short Term Goals: Compliance with prescribed medications will improve Ability to verbalize feelings will improve Ability to disclose and discuss suicidal ideas Ability to demonstrate self-control will improve     Medication Management: Evaluate patient's response, side effects, and tolerance of medication regimen.  Therapeutic Interventions: 1 to 1 sessions, Unit Group sessions and Medication administration.  Evaluation of Outcomes: Adequate for Discharge   RN Treatment Plan for Primary Diagnosis: Severe recurrent major depression without psychotic features (HCC) Long Term Goal(s): Knowledge of disease and therapeutic regimen to maintain health will improve  Short Term Goals: Ability to remain free from injury will improve, Ability to verbalize frustration and anger appropriately will improve, Ability to demonstrate self-control, Ability to participate in decision making will improve, Ability to verbalize feelings will improve, Ability to identify and develop effective coping behaviors will improve, and Compliance with prescribed medications will improve  Medication Management: RN will administer medications as ordered by provider, will assess and evaluate patient's response and provide education to patient for prescribed medication. RN will report any adverse and/or side effects to prescribing provider.  Therapeutic Interventions: 1 on 1 counseling sessions, Psychoeducation, Medication administration, Evaluate responses to treatment, Monitor vital signs and CBGs as ordered, Perform/monitor CIWA, COWS, AIMS and Fall Risk screenings as ordered, Perform wound care treatments as ordered.  Evaluation of Outcomes:  Adequate for Discharge   LCSW Treatment Plan for Primary Diagnosis: Severe recurrent major depression without psychotic features (HCC) Long Term Goal(s): Safe transition to appropriate next level of care at discharge, Engage patient in therapeutic group addressing interpersonal concerns.  Short Term Goals: Engage patient in aftercare planning with referrals and resources, Increase social support, Increase ability to appropriately verbalize feelings, Increase emotional regulation, Facilitate acceptance of mental health diagnosis and concerns, Identify triggers associated with mental health/substance abuse issues, and Increase skills for wellness and recovery  Therapeutic Interventions: Assess for all discharge needs, 1 to 1 time with Social worker, Explore available resources and support systems, Assess for adequacy in community support network, Educate family and significant other(s) on suicide prevention, Complete Psychosocial Assessment, Interpersonal group therapy.  Evaluation of Outcomes: Adequate for Discharge   Progress in Treatment: Attending groups: Yes. Participating in groups: Yes. Taking medication as prescribed: Yes. Toleration medication: Yes. Family/Significant other contact made: No, will contact:  when given permission Patient understands diagnosis: Yes. Discussing patient identified problems/goals with staff: Yes. Medical problems stabilized or resolved: Yes. Denies suicidal/homicidal ideation: Yes. Issues/concerns per patient self-inventory: No. Other: None  New  problem(s) identified: No, Describe:  None  New Short Term/Long Term Goal(s): Patient to work towards detox, medication management for mood stabilization; development of comprehensive mental wellness plan.   Patient Goals:  Pt states that she wants to work on lessening her anxiety and depression  Discharge Plan or Barriers: No psychosocial barriers identified for pt returning home. CSW will assist pt with  development of appropriate discharge/aftercare plan.   Reason for Continuation of Hospitalization: Anxiety Depression  Estimated Length of Stay: 1-7 days  Last 3 [place] Suicide Severity Risk Score: Flowsheet Row [facility] Most recent reading at 05/12/2022  3:00 PM [facility] Most recent reading at 05/12/2022 11:38 AM Video Visit from 05/10/2022 in confidential department Most recent reading at 05/10/2022  2:16 PM  C-SSRS RISK CATEGORY No Risk No Risk Low Risk       Last PHQ 2/9 Scores:    05/10/2022    2:13 PM 04/24/2022    2:03 PM 03/23/2022    7:54 AM  Depression screen PHQ 2/9  Decreased Interest 2 3 0  Down, Depressed, Hopeless 2 3 0  PHQ - 2 Score 4 6 0  Altered sleeping 0 3   Tired, decreased energy 2 3   Change in appetite 3 3   Feeling bad or failure about yourself  2 3   Trouble concentrating 3 1   Moving slowly or fidgety/restless 0 0   Suicidal thoughts 1 0   PHQ-9 Score 15 19   Difficult doing work/chores Very difficult Somewhat difficult     Scribe for Treatment Team: Pearce Littlefield A [place], LCSWA 05/15/2022 11:07 AM

## 2022-05-16 DIAGNOSIS — F332 Major depressive disorder, recurrent severe without psychotic features: Secondary | ICD-10-CM | POA: Diagnosis not present

## 2022-05-16 MED ORDER — BUPROPION HCL ER (XL) 150 MG PO TB24: 150.0000 mg | ORAL_TABLET | Freq: Every day | ORAL | 0 refills | Status: DC

## 2022-05-16 MED ORDER — MIRTAZAPINE 30 MG PO TABS: 30.0000 mg | ORAL_TABLET | Freq: Every day | ORAL | 0 refills | Status: DC

## 2022-05-16 MED ORDER — TRAZODONE HCL 50 MG PO TABS: 50.0000 mg | ORAL_TABLET | Freq: Every evening | ORAL | 0 refills | Status: DC | PRN

## 2022-05-16 MED ORDER — HYDROXYZINE HCL 10 MG PO TABS: 10.0000 mg | ORAL_TABLET | Freq: Three times a day (TID) | ORAL | 0 refills | Status: DC | PRN

## 2022-05-16 NOTE — Discharge Summary (Addendum)
Suicide Risk Assessment  [facility] Discharge Suicide Risk Assessment   Principal Problem: MDD (major depressive disorder), recurrent severe, without psychosis (HCC) Discharge Diagnoses: Principal Problem:   MDD (major depressive disorder), recurrent severe, without psychosis (HCC)   Total Time spent with patient: 45 minutes  Musculoskeletal: Strength & Muscle Tone: within normal limits Gait & Station: normal Patient leans: N/A  Psychiatric Specialty Exam: Physical Exam  Review of Systems  Blood pressure (!) 118/57, pulse 83, temperature 98.2 F (36.8 C), resp. rate 18, height 5\' 3"  (1.6 m), weight 59 kg, last menstrual period 11/27/1979, SpO2 99 %.Body mass index is 23.03 kg/m.  General Appearance: Casual  Eye Contact:  Good  Speech:  Normal Rate  Volume:  Normal  Mood:  Anxious  Affect:  Congruent  Thought Process:  Coherent  Orientation:  Full (Time, Place, and Person)  Thought Content:  WDL and Logical  Suicidal Thoughts:  No  Homicidal Thoughts:  No  Memory:  Immediate;   Good Recent;   Good Remote;   Good  Judgement:  Good  Insight:  Good  Psychomotor Activity:  Normal  Concentration:  Concentration: Good and Attention Span: Good  Recall:  Good  Fund of Knowledge:  Good  Language:  Good  Akathisia:  No  Handed:  Right  AIMS (if indicated):     Assets:  Housing Intimacy Leisure Time Physical Health Resilience Social Support  ADL's:  Intact  Cognition:  WNL  Sleep:        Physical Exam: Physical Exam ROS Blood pressure (!) 118/57, pulse 83, temperature 98.2 F (36.8 C), resp. rate 18, height 5\' 3"  (1.6 m), weight 59 kg, last menstrual period 11/27/1979, SpO2 99 %. Body mass index is 23.03 kg/m.  Mental Status Per Nursing Assessment::   On Admission:  NA  Demographic Factors:  Age 25 or older and Caucasian  Loss Factors: NA  Historical Factors: NA  Risk Reduction Factors:   Sense of responsibility to family, Living with another person,  especially a relative, Positive social support, and Positive therapeutic relationship  Continued Clinical Symptoms:  NOne  Cognitive Features That Contribute To Risk:  None    Suicide Risk:  Minimal: No identifiable suicidal ideation.  Patients presenting with no risk factors but with morbid ruminations; may be classified as minimal risk based on the severity of the depressive symptoms   Follow-up Information     [facility] Follow up on 05/22/2022.   Why: You have an appointment scheduled Monday June 12th at 10am with Wednesday for therapy. Thanks! Contact information: Phone: [phone] Address: [address] [place], [address] [place]        [facility] Follow up on 06/07/2022.   Specialty: Behavioral Health Why: You have an appointment for follow up scheduled with [facility] on Wednesday June 28th at 10:40 AM. Thanks! Contact information: [address] Medical [facility] [place] [facility] [place] [phone]                Plan Of Care/Follow-up recommendations:  Activity:  as tolerated  Diet:  heart healthy diet  Major depressive disorder, recurrent, severe: Continue Wellbutrin 150 mg daily Follow up with Dr [id number] and therapist   Anxiety: Continue hydroxyzine 10 mg TID PRN   Insomnia: Continue Trazodone 50 mg at bedtime PRN Continue Remeron 30 mg at bedtime  [phone], NP 05/16/2022, 8:48 AM

## 2022-05-16 NOTE — Progress Notes (Signed)
Patient is seen in the dayroom with visitor at the beginning of the shift. She is calm and cooperative. Pt is AAOx4. She denies SI, HI, and AVH. She voiced no complaints to this Clinical research associate. She is medication compliant. She is safe on the unit at this time. Q 15 min safety checks in place.

## 2022-05-16 NOTE — Progress Notes (Signed)
Recreation Therapy Notes  INPATIENT RECREATION TR PLAN  Patient Details Name: Angelica Robertson MRN: [id number] DOB: 05-16-1946 Today's Date: 05/16/2022  Rec Therapy Plan Is patient appropriate for Therapeutic Recreation?: Yes Treatment times per week: at least 3 Estimated Length of Stay: 5-7 days TR Treatment/Interventions: Group participation (Comment)  Discharge Criteria Pt will be discharged from therapy if:: Discharged Treatment plan/goals/alternatives discussed and agreed upon by:: Patient/family  Discharge Summary Short term goals set: Patient will identify 3 positive coping skills strategies to use post d/c within 5 recreation therapy group sessions Short term goals met: Adequate for discharge Progress toward goals comments: Groups attended Which groups?: Other (Comment) (Emotions) Reason goals not met: N/A Therapeutic equipment acquired: N/A Reason patient discharged from therapy: Discharge from hospital Pt/family agrees with progress & goals achieved: Yes Date patient discharged from therapy: 05/16/22   Janeya Deyo 05/16/2022, 1:25 PM

## 2022-05-16 NOTE — [facility] Suicide Risk Assessment (Cosign Needed)
[facility] Discharge Suicide Risk Assessment   Principal Problem: MDD (major depressive disorder), recurrent severe, without psychosis (HCC) Discharge Diagnoses: Principal Problem:   MDD (major depressive disorder), recurrent severe, without psychosis (HCC)   Total Time spent with patient: 45 minutes  Musculoskeletal: Strength & Muscle Tone: within normal limits Gait & Station: normal Patient leans: N/A  Psychiatric Specialty Exam: Physical Exam  Review of Systems  Blood pressure 132/66, pulse 91, temperature 98 F (36.7 C), resp. rate 20, height 5\' 3"  (1.6 m), weight 59 kg, last menstrual period 11/27/1979, SpO2 99 %.Body mass index is 23.03 kg/m.  General Appearance: Casual  Eye Contact:  Good  Speech:  Normal Rate  Volume:  Normal  Mood:  Anxious  Affect:  Congruent  Thought Process:  Coherent and Descriptions of Associations: Intact  Orientation:  Full (Time, Place, and Person)  Thought Content:  WDL and Logical  Suicidal Thoughts:  No  Homicidal Thoughts:  No  Memory:  Immediate;   Good Recent;   Good Remote;   Good  Judgement:  Good  Insight:  Good  Psychomotor Activity:  Normal  Concentration:  Concentration: Good and Attention Span: Good  Recall:  Good  Fund of Knowledge:  Good  Language:  Good  Akathisia:  No  Handed:  Right  AIMS (if indicated):     Assets:  Housing Intimacy Leisure Time Physical Health Resilience Social Support Transportation  ADL's:  Intact  Cognition:  WNL  Sleep:        Physical Exam: Physical Exam ROS Blood pressure 132/66, pulse 91, temperature 98 F (36.7 C), resp. rate 20, height 5\' 3"  (1.6 m), weight 59 kg, last menstrual period 11/27/1979, SpO2 99 %. Body mass index is 23.03 kg/m.  Mental Status Per Nursing Assessment::   On Admission:  NA  Demographic Factors:  Age 97 or older and Caucasian  Loss Factors: NA  Historical Factors: NA  Risk Reduction Factors:   Sense of responsibility to family, Religious beliefs  about death, Living with another person, especially a relative, Positive social support, and Positive therapeutic relationship  Continued Clinical Symptoms:  Anxiety, mild  Cognitive Features That Contribute To Risk:  None    Suicide Risk:  Minimal: No identifiable suicidal ideation.  Patients presenting with no risk factors but with morbid ruminations; may be classified as minimal risk based on the severity of the depressive symptoms   Follow-up Information     [facility] Follow up on 05/22/2022.   Why: You have an appointment scheduled Monday June 12th at 10am with Monday for therapy. Thanks! Contact information: Phone: [phone] Address: [address] [place], [address] [place]        [facility] Follow up on 06/07/2022.   Specialty: Behavioral Health Why: You have an appointment for follow up scheduled with [facility] on Wednesday June 28th at 10:40 AM. Thanks! Contact information: [address] Medical [facility] [place] [facility] [place] [phone]                Plan Of Care/Follow-up recommendations:  Activity:  as tolerated Diet:  heart healthy diet  Major depressive disorder, recurrent, severe: Continue Wellbutrin 150 mg daily Follow up with Dr [id number] and therapist   Anxiety: Continue hydroxyzine 10 mg TID PRN   Insomnia: Continue Trazodone 50 mg at bedtime PRN Continue Remeron 30 mg at bedtime   [phone], NP 05/16/2022, 10:27 AM

## 2022-05-16 NOTE — Progress Notes (Signed)
Patient is discharging at this time. Patient is A&Ox4. Vs stable. Patient denies SI,HI, and A/V/H with no plan/intent. Printed AVS reviewed with and given to patient along with medications and follow up appointments. Patient verbalized all understanding. All valuables/belongings returned to patient. Patient is being transported by her husband. Patient denies any pain/discomfort. No s/s of current distress.  

## 2022-05-16 NOTE — Care Management Important Message (Signed)
Important Message  Patient Details  Name: Angelica Robertson MRN: [id number] Date of Birth: October 31, 1946   Medicare Important Message Given:  Yes     Allayah Raineri A [place], LCSWA 05/16/2022, 9:18 AM

## 2022-05-16 NOTE — Plan of Care (Signed)
  Problem: Coping Skills Goal: STG - Patient will identify 3 positive coping skills strategies to use post d/c within 5 recreation therapy group sessions Description: STG - Patient will identify 3 positive coping skills strategies to use post d/c within 5 recreation therapy group sessions 05/16/2022 1322 by Alveria Apley, LRT Outcome: Adequate for Discharge 05/16/2022 1321 by Alveria Apley, LRT Outcome: Adequate for Discharge

## 2022-05-16 NOTE — Progress Notes (Signed)
  [facility] Adult Case Management Discharge Plan :  Will you be returning to the same living situation after discharge:  Yes,  pt will be returning home  At discharge, do you have transportation home?: Yes,  pt's husband is providing transportation home Do you have the ability to pay for your medications: Yes,  pt has [facility]  Release of information consent forms completed and in the chart;  Patient's signature needed at discharge.  Patient to Follow up at:  Follow-up Information     [facility] Follow up on 05/22/2022.   Why: You have an appointment scheduled Monday June 12th at [place] with Normand Sloop for therapy. Thanks! Contact information: Phone: [phone] Address: [place], [place] [address]        [facility] Follow up on 06/07/2022.   Specialty: Behavioral Health Why: You have an appointment for follow up scheduled with Dr. Shea Evans on Wednesday June 28th at 10:40 AM. Thanks! Contact information: [place] [phone]                Next level of care provider has access to [place] and Suicide Prevention discussed: Yes,  SPE completed with pt's husband     Has patient been referred to the Quitline?: N/A patient is not a smoker  Patient has been referred for addiction treatment: N/A  Idalie Canto A [place], [place] 05/16/2022, 10:02 AM

## 2022-05-17 ENCOUNTER — Other Ambulatory Visit: Payer: Self-pay | Admitting: Psychiatry

## 2022-05-18 ENCOUNTER — Telehealth: Payer: Self-pay

## 2022-05-18 NOTE — Telephone Encounter (Signed)
pt left a vocie mail that she needed to speak with you about her stay at the hospital last week.  she the medications they put her on.

## 2022-05-18 NOTE — Telephone Encounter (Signed)
Attempted to contact patient, had to leave a voicemail. 

## 2022-05-19 ENCOUNTER — Telehealth: Payer: Self-pay | Admitting: Psychiatry

## 2022-05-19 NOTE — Telephone Encounter (Signed)
Returned call to patient since I received a message from [place] CMA through secure chat that patient was returning my call from yesterday.  Patient reports she had concerns about the hydroxyzine, she took 1 dose of it yesterday and she felt like there was some noise in her head after a few hours, she hence does not want to take this medication anymore.  Patient advised to stop taking the hydroxyzine.  Patient reports she is also worried about drug to drug interaction between trazodone, mirtazapine, Wellbutrin-provided medication education, discussed risk of serotonin syndrome, other drug to drug interactions-patient advised to monitor herself closely.  Patient currently denies any side effects.  Patient reports she is motivated to start psychotherapy sessions soon with [facility].  Patient would like to have a sooner appointment-discussed with her that we will keep her on a wait list.  Will also communicate with our front desk staff-Scott.

## 2022-05-22 DIAGNOSIS — F332 Major depressive disorder, recurrent severe without psychotic features: Secondary | ICD-10-CM | POA: Diagnosis not present

## 2022-05-22 DIAGNOSIS — F4323 Adjustment disorder with mixed anxiety and depressed mood: Secondary | ICD-10-CM | POA: Diagnosis not present

## 2022-05-23 NOTE — Discharge Summary (Signed)
Physician Discharge Summary Note  Patient:  Angelica Robertson is an 76 y.o., female MRN:  [id number] DOB:  1946-05-10 Patient phone:  [phone] (home)  Patient address:   [phone] [place] [address],  Total Time spent with patient: 45 minutes  Date of Admission:  05/12/2022 Date of Discharge: 05/16/22  Reason for Admission:  suicidal ideations  Principal Problem: MDD (major depressive disorder), recurrent severe, without psychosis ([place]) Discharge Diagnoses: Principal Problem:   MDD (major depressive disorder), recurrent severe, without psychosis ([place])   Past Psychiatric History: depression, anxiety  Past Medical History:  Past Medical History:  Diagnosis Date   Depression 2000   Heart murmur    Hypercholesterolemia Osteoporosis   Hyperlipemia     Past Surgical History:  Procedure Laterality Date   ABDOMINAL HYSTERECTOMY  1980   COLONOSCOPY WITH PROPOFOL N/A 03/05/2018   Procedure: COLONOSCOPY WITH PROPOFOL;  Surgeon: Toledo, [name], MD;  Location: [facility] ENDOSCOPY;  Service: Endoscopy;  Laterality: N/A;   Family History:  Family History  Problem Relation Age of Onset   Heart disease Mother    Diabetes Mother    Heart disease Father    Diabetes Father    Colon cancer Other    Congestive Heart Failure Brother    Colon cancer Brother    Mental illness Paternal Aunt    BRCA 1/2 Neg Hx    Breast cancer Neg Hx    Cowden syndrome Neg Hx    DES usage Neg Hx    Endometrial cancer Neg Hx    Li-Fraumeni syndrome Neg Hx    Ovarian cancer Neg Hx    Family Psychiatric  History: see above Social History:  Social History   Substance and Sexual Activity  Alcohol Use Yes   Alcohol/week: 0.0 standard drinks of alcohol   Comment: occasionally - socially during the summer a glass of wine or beer     Social History   Substance and Sexual Activity  Drug Use No    Social History   Socioeconomic History   Marital status: Married    Spouse name: robert   Number of  children: 2   Years of education: Not on file   Highest education level: Not on file  Occupational History   Not on file  Tobacco Use   Smoking status: Never   Smokeless tobacco: Never  Vaping Use   Vaping Use: Never used  Substance and Sexual Activity   Alcohol use: Yes    Alcohol/week: 0.0 standard drinks of alcohol    Comment: occasionally - socially during the summer a glass of wine or beer   Drug use: No   Sexual activity: Yes  Other Topics Concern   Not on file  Social History Narrative   Not on file   Social Determinants of Health   Financial Resource Strain: Low Risk  (02/27/2018)   Overall Financial Resource Strain (CARDIA)    Difficulty of Paying Living Expenses: Not hard at all  Food Insecurity: No Food Insecurity (02/27/2018)   Hunger Vital Sign    Worried About Running Out of Food in the Last Year: Never true    [place] in the Last Year: Never true  Transportation Needs: No Transportation Needs (02/27/2018)   PRAPARE - Hydrologist (Medical): No    Lack of Transportation (Non-Medical): No  Physical Activity: Sufficiently Active (03/11/2019)   Exercise Vital Sign    Days of Exercise per Week:  5 days    Minutes of Exercise per Session: 30 min  Stress: Stress Concern Present (11/13/2019)   [place]    Feeling of Stress : To some extent  Social Connections: Unknown (11/13/2019)   Social Connection and Isolation Panel [NHANES]    Frequency of Communication with Friends and Family: Not on file    Frequency of Social Gatherings with Friends and Family: Not on file    Attends Religious Services: Not on file    Active Member of Clubs or Organizations: Not on file    Attends Archivist Meetings: Not on file    Marital Status: Married    Hospital Course:   6/2: Patient seen and chart reviewed.  Patient presented to the emergency room because of worsening  depression.  She has a history of depression but the mood has been worse for the past couple months.  She has been feeling more down and negative.  Lost interest in her usual activities.  Great deal of trouble sleeping at night and also not eating very well.  Suicidal thoughts without specific plan or intent.  Denies any psychotic symptoms.  She is having worse symptoms late at night and early in the morning.  She has been compliant with her medicine but feels that the medicine she is being prescribed has not recently helped.  Cymbalta was recently added by her doctor and she felt that this caused her to have dry mouth and headaches.  She has been compliant with other medicine.  No alcohol or drug abuse.  Having some stressful experiences with one of her children but no other major issues.  Medications:  Wellbutrin 150 mg daily, Trazodone 50 mg at bedtime, Remeron 30 mg daily at bedtime, hydroxyzine 10 mg TID PRN  6/3: Patient seen in follow-up for depression.  Patient reports her mood is still anxious but the depression is better.  Not feeling hopeless today.  Denies suicidal thoughts.  Had a couple questions about details of how her medicines were dosed.  No side effects from adding new medicines at this point.  Eating a little better  6/5: Patient seen in follow-up for depression after discussing with nursing and social work.  Client reports doing "much better", low level of depression, anxiety is mild to moderate, no panic attacks.  Denies side effects from her medications.  Appetite is fair, I order and eat it.  Sleep was interrupted by noises on the unit, "too loud".  She is busy in the dayroom socializing and playing solitaire, reading devotions, etc.  She feels ready to return home tomorrow, discharge planning in place.   6/6: Patient has met maximum benefit of hospitalization.  Denies suicidal/homicidal ideations, hallucinations, and withdrawal symptoms.  Discharge instructions provided with  explanations along with crisis numbers, Rx, and appointment information.  Musculoskeletal: Strength & Muscle Tone: within normal limits Gait & Station: normal Patient leans: N/A  Psychiatric Specialty Exam: Physical Exam Vitals reviewed.  Constitutional:      Appearance: Normal appearance.  HENT:     Head: Normocephalic.     Nose: Nose normal.  Pulmonary:     Effort: Pulmonary effort is normal.  Musculoskeletal:        General: Normal range of motion.     Cervical back: Normal range of motion.  Neurological:     General: No focal deficit present.     Mental Status: She is alert and oriented to person, place, and time.  Psychiatric:        Attention and Perception: Attention and perception normal.        Mood and Affect: Mood is anxious and depressed.        Speech: Speech normal.        Behavior: Behavior normal. Behavior is cooperative.        Thought Content: Thought content normal.        Cognition and Memory: Cognition and memory normal.        Judgment: Judgment normal.     Review of Systems  Psychiatric/Behavioral:  Positive for depression. The patient is nervous/anxious.   All other systems reviewed and are negative.   Blood pressure 132/66, pulse 91, temperature 98 F (36.7 C), resp. rate 20, height 5' 3"  (1.6 m), weight 59 kg, last menstrual period 11/27/1979, SpO2 99 %.Body mass index is 23.03 kg/m.  General Appearance: Casual  Eye Contact:  Good  Speech:  Normal Rate  Volume:  Normal  Mood:  Anxious and Depressed  Affect:  Congruent  Thought Process:  Coherent and Descriptions of Associations: Intact  Orientation:  Full (Time, Place, and Person)  Thought Content:  WDL and Logical  Suicidal Thoughts:  No  Homicidal Thoughts:  No  Memory:  Immediate;   Good Recent;   Good Remote;   Good  Judgement:  Good  Insight:  Good  Psychomotor Activity:  Normal  Concentration:  Concentration: Good and Attention Span: Good  Recall:  Good  Fund of Knowledge:   Good  Language:  Good  Akathisia:  No  Handed:  Right  AIMS (if indicated):     Assets:  Leisure Time Physical Health Resilience Social Support  ADL's:  Intact  Cognition:  WNL  Sleep:         Physical Exam: Physical Exam Vitals reviewed.  Constitutional:      Appearance: Normal appearance.  HENT:     Head: Normocephalic.     Nose: Nose normal.  Pulmonary:     Effort: Pulmonary effort is normal.  Musculoskeletal:        General: Normal range of motion.     Cervical back: Normal range of motion.  Neurological:     General: No focal deficit present.     Mental Status: She is alert and oriented to person, place, and time.  Psychiatric:        Attention and Perception: Attention and perception normal.        Mood and Affect: Mood is anxious and depressed.        Speech: Speech normal.        Behavior: Behavior normal. Behavior is cooperative.        Thought Content: Thought content normal.        Cognition and Memory: Cognition and memory normal.        Judgment: Judgment normal.    Review of Systems  Psychiatric/Behavioral:  Positive for depression. The patient is nervous/anxious.   All other systems reviewed and are negative.  Blood pressure 132/66, pulse 91, temperature 98 F (36.7 C), resp. rate 20, height 5' 3"  (1.6 m), weight 59 kg, last menstrual period 11/27/1979, SpO2 99 %. Body mass index is 23.03 kg/m.   Social History   Tobacco Use  Smoking Status Never  Smokeless Tobacco Never   Tobacco Cessation:  N/A, patient does not currently use tobacco products   Blood Alcohol level:  Lab Results  Component Value Date   ETH <10 05/12/2022   ETH <  10 41/74/0814    Metabolic Disorder Labs:  Lab Results  Component Value Date   HGBA1C 6.3 03/21/2022   No results found for: "PROLACTIN" Lab Results  Component Value Date   CHOL 152 03/21/2022   TRIG 136.0 03/21/2022   HDL 57.30 03/21/2022   CHOLHDL 3 03/21/2022   VLDL 27.2 03/21/2022   LDLCALC 67  03/21/2022   LDLCALC 71 11/18/2021    See Psychiatric Specialty Exam and Suicide Risk Assessment completed by Attending Physician prior to discharge.  Discharge destination:  Home  Is patient on multiple antipsychotic therapies at discharge:  No   Has Patient had three or more failed trials of antipsychotic monotherapy by history:  No  Recommended Plan for Multiple Antipsychotic Therapies: NA  Discharge Instructions     Diet - low sodium heart healthy   Complete by: As directed    Discharge instructions   Complete by: As directed    Follow up with Dr Shea Evans and your therapy appointment   Increase activity slowly   Complete by: As directed       Allergies as of 05/16/2022   No Known Allergies      Medication List     STOP taking these medications    B-12 PO   DULoxetine 20 MG capsule Commonly known as: CYMBALTA   Glucosamine-Chondroitin 250-200 MG Caps       TAKE these medications      Indication  amLODipine 2.5 MG tablet Commonly known as: Norvasc Take 1 tablet (2.5 mg total) by mouth daily. In am    buPROPion 150 MG 24 hr tablet Commonly known as: WELLBUTRIN XL Take 1 tablet (150 mg total) by mouth daily.  Indication: Major Depressive Disorder   calcium carbonate 600 MG Tabs tablet Commonly known as: OS-CAL Take 600 mg by mouth 2 (two) times daily with a meal.  Indication: Low Amount of Calcium in the Blood   losartan 25 MG tablet Commonly known as: COZAAR Take 1 tablet (25 mg total) by mouth daily.    mirtazapine 30 MG tablet Commonly known as: REMERON Take 1 tablet (30 mg total) by mouth at bedtime. What changed:  medication strength See the new instructions.  Indication: sleep   rosuvastatin 20 MG tablet Commonly known as: CRESTOR TAKE 1 TABLET BY MOUTH AT BEDTIME    traZODone 50 MG tablet Commonly known as: DESYREL Take 1 tablet (50 mg total) by mouth at bedtime as needed for sleep.  Indication: Trouble Sleeping   VITAMIN D  PO Take by mouth daily.         Follow-up Information     [facility] Follow up on 05/22/2022.   Why: You have an appointment scheduled Monday June 12th at [place] with Normand Sloop for therapy. Thanks! Contact information: Phone: [phone] Address: [place], [place] [address]        [facility] Follow up on 06/07/2022.   Specialty: Behavioral Health Why: You have an appointment for follow up scheduled with Dr. Shea Evans on Wednesday June 28th at 10:40 AM. Thanks! Contact information: [address] [place] [phone]                Follow-up recommendations:  Activity:  as tolerated Diet:  heart healthy diet   Major depressive disorder, recurrent, severe: Continue Wellbutrin 150 mg daily Follow up with Dr Shea Evans and therapist   Anxiety: Continue hydroxyzine 10 mg TID PRN  Insomnia: Continue Trazodone 50 mg at bedtime PRN Continue Remeron 30 mg at bedtime  Comments:  Follow up with Dr Shea Evans  Signed: Waylan Boga, NP 05/23/2022, 6:51 AM

## 2022-05-25 ENCOUNTER — Encounter: Payer: Self-pay | Admitting: Internal Medicine

## 2022-05-25 DIAGNOSIS — M542 Cervicalgia: Secondary | ICD-10-CM

## 2022-05-26 ENCOUNTER — Ambulatory Visit (INDEPENDENT_AMBULATORY_CARE_PROVIDER_SITE_OTHER): Payer: Medicare PPO

## 2022-05-26 DIAGNOSIS — E538 Deficiency of other specified B group vitamins: Secondary | ICD-10-CM

## 2022-05-26 MED ORDER — CYANOCOBALAMIN 1000 MCG/ML IJ SOLN
1000.0000 ug | Freq: Once | INTRAMUSCULAR | Status: AC
  Administered 2022-05-26: 1000 ug via INTRAMUSCULAR

## 2022-05-26 NOTE — Telephone Encounter (Signed)
See me before calling pt. I have placed the order for PT.  Please call pt and confirm doing ok.  Was just in hospital.

## 2022-05-26 NOTE — Telephone Encounter (Signed)
S/w pt - confirmed she is doing much better. Is on Day 13 of her new medication regiment  Medications:  Wellbutrin 150 mg daily, Trazodone 50 mg at bedtime, Remeron 30 mg daily at bedtime, hydroxyzine 10 mg TID PRN Feeling well.  Advised referral placed.

## 2022-05-29 DIAGNOSIS — F332 Major depressive disorder, recurrent severe without psychotic features: Secondary | ICD-10-CM | POA: Diagnosis not present

## 2022-05-29 DIAGNOSIS — F4323 Adjustment disorder with mixed anxiety and depressed mood: Secondary | ICD-10-CM | POA: Diagnosis not present

## 2022-06-01 ENCOUNTER — Encounter: Payer: Self-pay | Admitting: Internal Medicine

## 2022-06-01 NOTE — Telephone Encounter (Signed)
Pt scheduled  

## 2022-06-01 NOTE — Telephone Encounter (Signed)
Please schedule her an appt to see me and we can determine what is needed.

## 2022-06-05 ENCOUNTER — Telehealth: Payer: Self-pay

## 2022-06-06 DIAGNOSIS — F332 Major depressive disorder, recurrent severe without psychotic features: Secondary | ICD-10-CM | POA: Diagnosis not present

## 2022-06-06 DIAGNOSIS — F4323 Adjustment disorder with mixed anxiety and depressed mood: Secondary | ICD-10-CM | POA: Diagnosis not present

## 2022-06-07 ENCOUNTER — Telehealth (INDEPENDENT_AMBULATORY_CARE_PROVIDER_SITE_OTHER): Payer: Medicare PPO | Admitting: Psychiatry

## 2022-06-07 ENCOUNTER — Encounter: Payer: Self-pay | Admitting: Psychiatry

## 2022-06-07 DIAGNOSIS — F411 Generalized anxiety disorder: Secondary | ICD-10-CM

## 2022-06-07 DIAGNOSIS — T50905A Adverse effect of unspecified drugs, medicaments and biological substances, initial encounter: Secondary | ICD-10-CM | POA: Insufficient documentation

## 2022-06-07 DIAGNOSIS — F332 Major depressive disorder, recurrent severe without psychotic features: Secondary | ICD-10-CM

## 2022-06-07 MED ORDER — MIRTAZAPINE 30 MG PO TABS: 30.0000 mg | ORAL_TABLET | Freq: Every day | ORAL | 0 refills | Status: DC

## 2022-06-07 NOTE — Telephone Encounter (Signed)
Yesterday it was busy and I didn't have time to do inbox messages or emails

## 2022-06-07 NOTE — Progress Notes (Signed)
Virtual Visit via Video Note  I connected with Angelica Robertson on 06/07/22 at 10:40 AM EDT by a video enabled telemedicine application and verified that I am speaking with the correct person using two identifiers.  Location Provider Location : [facility] Patient Location : Home  Participants: Patient ,Spouse, Provider    I discussed the limitations of evaluation and management by telemedicine and the availability of in person appointments. The patient expressed understanding and agreed to proceed.    I discussed the assessment and treatment plan with the patient. The patient was provided an opportunity to ask questions and all were answered. The patient agreed with the plan and demonstrated an understanding of the instructions.   The patient was advised to call back or seek an in-person evaluation if the symptoms worsen or if the condition fails to improve as anticipated.   [place] MD OP Progress Note  06/07/2022 12:18 PM Angelica Robertson  MRN:  [id number]  Chief Complaint:  Chief Complaint  Patient presents with   Follow-up: 76 year old Caucasian female with history of MDD, GAD, presented for medication management with possible  side effects of her antidepressant.   HPI: Angelica Robertson is a 76 year old Caucasian female, married, retired, lives in [place], has a history of MDD, GAD, hyperlipidemia, arthritis was evaluated by telemedicine today.  Patient as well as spouse participated in the evaluation today.  Patient today appeared to be anxious, depressed, reports she feels sick.  Reports she has tinnitus, low appetite, weight loss, and elevated blood pressure, dizziness and falls since being on the Wellbutrin.  Patient reports she had passing out episodes on Monday night.  She reports she took all her nighttime medication including the trazodone for sleep.  After laying in bed for a few minutes she got back up again and went into the kitchen.  Patient reports she had a passing out episode and then later  on had more passing out episodes.  Patient reports in the morning she wakes up with dizziness and headaches.  She reports her blood pressure has been extremely elevated in the mornings.  She wonders whether her current medication changes could be causing it.  Patient continues to feel anxious, depressed especially since she currently has side effects.  Patient denies any suicidality, homicidality or perceptual disturbances.  Patient appeared to be alert, oriented to person place time situation.  Reports she is not interested in referral for transcranial magnetic stimulation at this time.  She is motivated to stay in therapy with her therapist at [facility].  But collateral information obtained from spouse patient had several passing out episodes since being on this new combination of medications.    Visit Diagnosis:    ICD-10-CM   1. MDD (major depressive disorder), recurrent severe, without psychosis ([place])  F33.2 mirtazapine (REMERON) 30 MG tablet    2. GAD (generalized anxiety disorder)  F41.1 mirtazapine (REMERON) 30 MG tablet    3. Adverse effect of drug, initial encounter  T50.905A    wellbutrin , trazodone      Past Psychiatric History: Reviewed past psychiatric history from progress note on 01/12/2020.  Past Medical History:  Past Medical History:  Diagnosis Date   Depression 2000   Heart murmur    Hypercholesterolemia Osteoporosis   Hyperlipemia     Past Surgical History:  Procedure Laterality Date   ABDOMINAL HYSTERECTOMY  1980   COLONOSCOPY WITH PROPOFOL N/A 03/05/2018   Procedure: COLONOSCOPY WITH PROPOFOL;  Surgeon: Alice Reichert, [name], MD;  Location: [facility]  ENDOSCOPY;  Service: Endoscopy;  Laterality: N/A;    Family Psychiatric History: Reviewed family psychiatric history from progress note on 01/12/2020.  Family History:  Family History  Problem Relation Age of Onset   Heart disease Mother    Diabetes Mother    Heart disease Father    Diabetes Father     Colon cancer Other    Congestive Heart Failure Brother    Colon cancer Brother    Mental illness Paternal Aunt    BRCA 1/2 Neg Hx    Breast cancer Neg Hx    Cowden syndrome Neg Hx    DES usage Neg Hx    Endometrial cancer Neg Hx    Li-Fraumeni syndrome Neg Hx    Ovarian cancer Neg Hx     Social History: Reviewed social history from progress note on 01/12/2020. Social History   Socioeconomic History   Marital status: Married    Spouse name: robert   Number of children: 2   Years of education: Not on file   Highest education level: Not on file  Occupational History   Not on file  Tobacco Use   Smoking status: Never   Smokeless tobacco: Never  Vaping Use   Vaping Use: Never used  Substance and Sexual Activity   Alcohol use: Yes    Alcohol/week: 0.0 standard drinks of alcohol    Comment: occasionally - socially during the summer a glass of wine or beer   Drug use: No   Sexual activity: Yes  Other Topics Concern   Not on file  Social History Narrative   Not on file   Social Determinants of Health   Financial Resource Strain: Low Risk  (02/27/2018)   Overall Financial Resource Strain (CARDIA)    Difficulty of Paying Living Expenses: Not hard at all  Food Insecurity: No Food Insecurity (02/27/2018)   Hunger Vital Sign    Worried About Running Out of Food in the Last Year: Never true    [place] in the Last Year: Never true  Transportation Needs: No Transportation Needs (02/27/2018)   PRAPARE - Hydrologist (Medical): No    Lack of Transportation (Non-Medical): No  Physical Activity: Sufficiently Active (03/11/2019)   Exercise Vital Sign    Days of Exercise per Week: 5 days    Minutes of Exercise per Session: 30 min  Stress: Stress Concern Present (11/13/2019)   [place]    Feeling of Stress : To some extent  Social Connections: Unknown (11/13/2019)   Social Connection  and Isolation Panel [NHANES]    Frequency of Communication with Friends and Family: Not on file    Frequency of Social Gatherings with Friends and Family: Not on file    Attends Religious Services: Not on file    Active Member of Clubs or Organizations: Not on file    Attends Archivist Meetings: Not on file    Marital Status: Married    Allergies: No Known Allergies  Metabolic Disorder Labs: Lab Results  Component Value Date   HGBA1C 6.3 03/21/2022   No results found for: "PROLACTIN" Lab Results  Component Value Date   CHOL 152 03/21/2022   TRIG 136.0 03/21/2022   HDL 57.30 03/21/2022   CHOLHDL 3 03/21/2022   VLDL 27.2 03/21/2022   LDLCALC 67 03/21/2022   [place] 71 11/18/2021   Lab Results  Component Value Date   TSH 4.46 03/21/2022  TSH 3.32 11/29/2020    Therapeutic Level Labs: No results found for: "LITHIUM" No results found for: "VALPROATE" No results found for: "CBMZ"  Current Medications: Current Outpatient Medications  Medication Sig Dispense Refill   amLODipine (NORVASC) 2.5 MG tablet Take 1 tablet (2.5 mg total) by mouth daily. In am 90 tablet 3   calcium carbonate (OS-CAL) 600 MG TABS tablet Take 600 mg by mouth 2 (two) times daily with a meal.     losartan (COZAAR) 25 MG tablet Take 1 tablet (25 mg total) by mouth daily. 90 tablet 1   rosuvastatin (CRESTOR) 20 MG tablet TAKE 1 TABLET BY MOUTH AT BEDTIME (Patient taking differently: Take 20 mg by mouth at bedtime.) 90 tablet 3   VITAMIN D PO Take by mouth daily.     mirtazapine (REMERON) 30 MG tablet Take 1 tablet (30 mg total) by mouth at bedtime. 30 tablet 0   No current facility-administered medications for this visit.     Musculoskeletal: Strength & Muscle Tone:  UTA Gait & Station:  Seated Patient leans: N/A  Psychiatric Specialty Exam: Review of Systems  Constitutional:  Positive for fatigue.  HENT:  Positive for tinnitus.   Neurological:  Positive for dizziness and headaches.   Psychiatric/Behavioral:  Positive for decreased concentration, dysphoric mood and sleep disturbance. The patient is nervous/anxious.   All other systems reviewed and are negative.   Last menstrual period 11/27/1979.There is no height or weight on file to calculate BMI.  General Appearance: Casual  Eye Contact:  Fair  Speech:  Normal Rate  Volume:  Normal  Mood:  Anxious and Depressed  Affect:  Depressed  Thought Process:  Goal Directed and Descriptions of Associations: Intact  Orientation:  Full (Time, Place, and Person)  Thought Content: Logical   Suicidal Thoughts:  No  Homicidal Thoughts:  No  Memory:  Immediate;   Fair Recent;   Fair Remote;   Fair  Judgement:  Fair  [place]:  Fair  Psychomotor Activity:  Normal  Concentration:  Concentration: Fair and Attention Span: Fair  Recall:  [employer] of Knowledge: Fair  Language: Fair  Akathisia:  No  Handed:  Right  AIMS (if indicated): not done  Assets:  Communication Skills Desire for Improvement Housing Social Support  ADL's:  Intact  Cognition: WNL  Sleep:  Poor   Screenings: [facility] Office Visit from 04/24/2022 in [facility] Total Score 0      AUDIT    Flowsheet Row Admission (Discharged) from 05/12/2022 in [facility] Admission (Discharged) from 12/17/2019 in [place] [facility] Admission (Discharged) from OP Visit from 12/09/2012 in [facility]  Alcohol Use Disorder Identification Test Final Score (AUDIT) 0 0 0      GAD-7    Flowsheet Row Video Visit from 01/18/2022 in [facility] Video Visit from 05/17/2021 in [facility] Counselor from 01/14/2020 in [facility]  Total GAD-7 Score 0 0 5      Mini-Mental    [place] from 02/27/2018 in [facility] from 02/02/2017 in [facility] from 02/03/2016 in [facility]  Total Score (max 30 points ) 30 30 30       PHQ2-9    Flowsheet Row Video Visit from 06/07/2022 in [place] Video Visit from 05/10/2022 in [facility]  [facility] Office Visit from 04/24/2022 in [facility] Office Visit from 03/23/2022 in [facility] Video Visit from 01/18/2022 in [facility]  PHQ-2 Total Score 4 4 6  0 0  PHQ-9 Total Score 18 15 19  -- --      Flowsheet Row Admission (Discharged) from 05/12/2022 in [facility] Most recent reading at 05/12/2022  3:00 PM ED from 05/12/2022 in [facility] Most recent reading at 05/12/2022 11:38 AM Video Visit from 05/10/2022 in [place] Most recent reading at 05/10/2022  2:16 PM  C-SSRS RISK CATEGORY No Risk No Risk Low Risk        Assessment and Plan: Angelica Robertson is a 76 year old Caucasian female, married, retired, lives in [place], has a history of MDD, anxiety, hyperlipidemia was evaluated by telemedicine today.  Patient with possible adverse side effect of medications like Wellbutrin, trazodone, will benefit from following plan.  Plan MDD-unstable Continue mirtazapine 30 mg p.o. nightly-recently dose readjusted during her inpatient behavioral health admission. Could start melatonin low-dose at night to help with sleep. Discontinue Wellbutrin for side effects.  We will consider making more medication changes as needed in future sessions. Patient however will need to be evaluated by primary care provider for her current symptoms.  She has upcoming appointment.  GAD-unstable Mirtazapine 30 mg p.o. nightly Continue [facility].  Adverse side effects to  psychotropics-unstable Stop Wellbutrin and trazodone. Patient has upcoming appointment with primary care provider for evaluation.  Collateral information obtained from spouse as noted above.  Discussed referral for TMS-patient not interested at this time.  Follow-up in clinic in 2 weeks or sooner in office.  Collaboration of Care: Collaboration of Care: Primary Care Provider AEB encouraged to keep her appointment with her primary care provider for her current symptoms of dizziness, tinnitus and elevated blood pressure as well as falls.  Patient/Guardian was advised Release of Information must be obtained prior to any record release in order to collaborate their care with an outside provider. Patient/Guardian was advised if they have not already done so to contact the registration department to sign all necessary forms in order for [place] to release information regarding their care.   Consent: Patient/Guardian gives verbal consent for treatment and assignment of benefits for services provided during this visit. Patient/Guardian expressed understanding and agreed to proceed.   This note was generated in part or whole with voice recognition software. Voice recognition is usually quite accurate but there are transcription errors that can and very often do occur. I apologize for any typographical errors that were not detected and corrected.      Ursula Alert, MD 06/07/2022, 12:18 PM

## 2022-06-07 NOTE — Telephone Encounter (Signed)
Patient upset .Just got off the phone with this patient after her telemedicine appointment.  Patient stated that she never got a call back about her medication side effect concerns when she called our office on 06/05/2022.  I have sent a message to the nurse regarding her concern about medication.  I will have the nurse check back and call patient back .

## 2022-06-08 ENCOUNTER — Other Ambulatory Visit: Payer: Self-pay | Admitting: Psychiatry

## 2022-06-08 ENCOUNTER — Ambulatory Visit
Admission: RE | Admit: 2022-06-08 | Discharge: 2022-06-08 | Disposition: A | Discharge status: Home / Self Care | Payer: Medicare PPO | Source: Ambulatory Visit | Attending: Internal Medicine | Admitting: Internal Medicine

## 2022-06-08 ENCOUNTER — Ambulatory Visit: Payer: Medicare PPO | Admitting: Internal Medicine

## 2022-06-08 ENCOUNTER — Encounter: Payer: Self-pay | Admitting: Internal Medicine

## 2022-06-08 VITALS — BP 128/80 | HR 83 | Temp 98.5°F | Resp 17 | Ht 63.0 in | Wt 128.8 lb

## 2022-06-08 DIAGNOSIS — I10 Essential (primary) hypertension: Secondary | ICD-10-CM

## 2022-06-08 DIAGNOSIS — F411 Generalized anxiety disorder: Secondary | ICD-10-CM

## 2022-06-08 DIAGNOSIS — M542 Cervicalgia: Secondary | ICD-10-CM

## 2022-06-08 DIAGNOSIS — E7800 Pure hypercholesterolemia, unspecified: Secondary | ICD-10-CM | POA: Diagnosis not present

## 2022-06-08 DIAGNOSIS — R739 Hyperglycemia, unspecified: Secondary | ICD-10-CM

## 2022-06-08 DIAGNOSIS — R531 Weakness: Secondary | ICD-10-CM

## 2022-06-08 DIAGNOSIS — R519 Headache, unspecified: Secondary | ICD-10-CM

## 2022-06-08 DIAGNOSIS — S0990XA Unspecified injury of head, initial encounter: Secondary | ICD-10-CM | POA: Diagnosis not present

## 2022-06-08 DIAGNOSIS — R55 Syncope and collapse: Secondary | ICD-10-CM

## 2022-06-08 DIAGNOSIS — F332 Major depressive disorder, recurrent severe without psychotic features: Secondary | ICD-10-CM

## 2022-06-08 NOTE — Assessment & Plan Note (Signed)
Persistent s/p head injury.  Persistent dizziness.  Obtain CT head to confirm no acute abnormality.

## 2022-06-08 NOTE — Assessment & Plan Note (Addendum)
Had the syncopal episode as outlined.  Hit her head.  Has had some persistent head pain and dizziness.  Given that she hit her had and given persistent head pain and dizziness, discussed obtained head CT to confirm no acute bleed, etc.  Pt agreeable.  Discussed possible etiologies.  She has not been eating as well.  Took the medication as outlined.  Also discussed possible cardiac etiology.  EKG - appears to be SR.  No acute ischemic changes noted.  Will have cardiology confirm.  Murmur on exam. Has a history of heart murmur.  Last echo 2019 with no significant findings.  Discussed cardiology evaluation for further w/up.  She declines.  Wants to monitor.  Will notify me or be reevaluated if problems or if changes her mind. She is now off trazodone and wellbutrin.

## 2022-06-08 NOTE — Progress Notes (Addendum)
Patient ID: Angelica Robertson, female   DOB: 1946/01/27, 76 y.o.   MRN: [id number]   Subjective:    Patient ID: Angelica Robertson, female    DOB: [birth year], 76 y.o.   MRN: [id number]   Patient here for work in appt.   Chief Complaint  Patient presents with   Hypertension   Depression   .   HPI Admitted 05/12/22 - 05/16/22 - worsening depression.  Medications have been adjusted.  Seeing psychiatry.  Just talked with Dr Shea Evans yesterday.  States since her discharge, she has had good and bad days.  She is having to make herself eat and go to the gym.  She also reports having issues which she felt were related to her medication.  Was experiencing elevated blood pressure, dry mouth and some headaches.  Having increased neck pain.  She reports that Monday 06/05/22 evening, she took her blood pressure medication and sleeping medication prior to going to bed. Was having issues with restless legs, so she got up and walked to the kitchen.  Started feeling dizzy.  Was holding on to the counter.  Passed out.  Remembers her husband coming to her.  She did hit the left side of her head.  Has has some residual pain and tenderness.  Still some persistent light headedness/dizziness.  She spoke to psychiatry yesterday and they felt was probably related to her medication.  She denies any chest pain, increased heart rate or palpitations.  No increased sob.  No vomiting.  Bowels moving. She has stopped trazodone now.  Also now (as of yesterday) has stopped wellbutrin.  She was concerned was contributing to decreased appetite and weight loss.  Had scheduled this appt to discuss PT for her neck.    Past Medical History:  Diagnosis Date   Depression 2000   Heart murmur    Hypercholesterolemia Osteoporosis   Hyperlipemia    Past Surgical History:  Procedure Laterality Date   ABDOMINAL HYSTERECTOMY  1980   COLONOSCOPY WITH PROPOFOL N/A 03/05/2018   Procedure: COLONOSCOPY WITH PROPOFOL;  Surgeon: Toledo, [name], MD;  Location:  [facility] ENDOSCOPY;  Service: Endoscopy;  Laterality: N/A;   Family History  Problem Relation Age of Onset   Heart disease Mother    Diabetes Mother    Heart disease Father    Diabetes Father    Colon cancer Other    Congestive Heart Failure Brother    Colon cancer Brother    Mental illness Paternal Aunt    BRCA 1/2 Neg Hx    Breast cancer Neg Hx    Cowden syndrome Neg Hx    DES usage Neg Hx    Endometrial cancer Neg Hx    Li-Fraumeni syndrome Neg Hx    Ovarian cancer Neg Hx    Social History   Socioeconomic History   Marital status: Married    Spouse name: robert   Number of children: 2   Years of education: Not on file   Highest education level: Not on file  Occupational History   Not on file  Tobacco Use   Smoking status: Never   Smokeless tobacco: Never  Vaping Use   Vaping Use: Never used  Substance and Sexual Activity   Alcohol use: Yes    Alcohol/week: 0.0 standard drinks of alcohol    Comment: occasionally - socially during the summer a glass of wine or beer   Drug use: No   Sexual activity: Yes  Other Topics Concern   Not on  file  Social History Narrative   Not on file   Social Determinants of Health   Financial Resource Strain: Low Risk  (02/27/2018)   Overall Financial Resource Strain (CARDIA)    Difficulty of Paying Living Expenses: Not hard at all  Food Insecurity: No Food Insecurity (02/27/2018)   Hunger Vital Sign    Worried About Running Out of Food in the Last Year: Never true    Ran Out of Food in the Last Year: Never true  Transportation Needs: No Transportation Needs (02/27/2018)   PRAPARE - Hydrologist (Medical): No    Lack of Transportation (Non-Medical): No  Physical Activity: Sufficiently Active (03/11/2019)   Exercise Vital Sign    Days of Exercise per Week: 5 days    Minutes of Exercise per Session: 30 min  Stress: Stress Concern Present (11/13/2019)   [place]    Feeling of Stress : To some extent  Social Connections: Unknown (11/13/2019)   Social Connection and Isolation Panel [NHANES]    Frequency of Communication with Friends and Family: Not on file    Frequency of Social Gatherings with Friends and Family: Not on file    Attends Religious Services: Not on file    Active Member of Clubs or Organizations: Not on file    Attends Archivist Meetings: Not on file    Marital Status: Married     Review of Systems  Constitutional:  Positive for appetite change and fatigue.       Weight loss   HENT:  Negative for congestion and sinus pressure.   Respiratory:  Negative for cough, chest tightness and shortness of breath.   Cardiovascular:  Negative for chest pain, palpitations and leg swelling.  Gastrointestinal:  Negative for abdominal pain, diarrhea, nausea and vomiting.  Genitourinary:  Negative for difficulty urinating and dysuria.  Musculoskeletal:  Positive for neck pain. Negative for joint swelling and myalgias.  Skin:  Negative for color change and rash.  Neurological:  Positive for dizziness.       Head pain.   Psychiatric/Behavioral:         Increased depression as outlined.        Objective:     BP 128/80 (BP Location: Left Arm, Patient Position: Sitting, Cuff Size: Small)   Pulse 83   Temp 98.5 F (36.9 C) (Temporal)   Resp 17   Ht _0  (1.6 m)   Wt 128 lb 12.8 oz (58.4 kg)   LMP 11/27/1979   SpO2 98%   BMI 22.82 kg/m  Wt Readings from Last 3 Encounters:  06/08/22 128 lb 12.8 oz (58.4 kg)  05/12/22 130 lb (59 kg)  05/12/22 130 lb (59 kg)    Physical Exam Vitals reviewed.  Constitutional:      General: She is not in acute distress.    Appearance: Normal appearance.  HENT:     Head: Normocephalic and atraumatic.     Right Ear: External ear normal.     Left Ear: External ear normal.  Eyes:     General: No scleral icterus.       Right eye: No discharge.        Left eye: No  discharge.     Conjunctiva/sclera: Conjunctivae normal.  Neck:     Thyroid: No thyromegaly.     Comments: Some increased discomfort with rotation of head to left and right.  Cardiovascular:  Rate and Rhythm: Normal rate and regular rhythm.  Pulmonary:     Effort: No respiratory distress.     Breath sounds: Normal breath sounds. No wheezing.  Abdominal:     General: Bowel sounds are normal.     Palpations: Abdomen is soft.     Tenderness: There is no abdominal tenderness.  Musculoskeletal:        General: No swelling or tenderness.     Cervical back: Neck supple.  Lymphadenopathy:     Cervical: No cervical adenopathy.  Skin:    Findings: No erythema or rash.  Neurological:     Mental Status: She is alert.  Psychiatric:        Mood and Affect: Mood normal.        Behavior: Behavior normal.      Outpatient Encounter Medications as of 06/08/2022  Medication Sig   amLODipine (NORVASC) 2.5 MG tablet Take 1 tablet (2.5 mg total) by mouth daily. In am   calcium carbonate (OS-CAL) 600 MG TABS tablet Take 600 mg by mouth 2 (two) times daily with a meal.   losartan (COZAAR) 25 MG tablet Take 1 tablet (25 mg total) by mouth daily.   mirtazapine (REMERON) 30 MG tablet Take 1 tablet (30 mg total) by mouth at bedtime.   rosuvastatin (CRESTOR) 20 MG tablet TAKE 1 TABLET BY MOUTH AT BEDTIME (Patient taking differently: Take 20 mg by mouth at bedtime.)   VITAMIN D PO Take by mouth daily.   No facility-administered encounter medications on file as of 06/08/2022.     Lab Results  Component Value Date   WBC 11.3 (H) 05/12/2022   HGB 13.6 05/12/2022   HCT 40.5 05/12/2022   PLT 327 05/12/2022   GLUCOSE 203 (H) 05/12/2022   CHOL 152 03/21/2022   TRIG 136.0 03/21/2022   HDL 57.30 03/21/2022   LDLCALC 67 03/21/2022   ALT 24 05/12/2022   AST 28 05/12/2022   NA 136 05/12/2022   K 3.6 05/12/2022   CL 101 05/12/2022   CREATININE 0.72 05/12/2022   BUN 11 05/12/2022   CO2 26 05/12/2022    TSH 4.46 03/21/2022   HGBA1C 6.3 03/21/2022       Assessment & Plan:   Problem List Items Addressed This Visit     Cervicalgia    Has been having issues with her neck.  Has a history of neck pain.  Discussed xray today.  Wants to hold.  Will notify me when agreeable.  Wants to hold on PT at this time.  Prefers to get her other issues addressed prior to going to PT.       Head injury    Recent syncopal episodes as outlined.  Discussed possible etiologies.  She has not been eating as well.  Took the medication as outlined.  Also discussed possible cardiac etiology.  EKG - appears to be SR.  No acute ischemic changes noted.  Will have cardiology confirm.  Murmur on exam. Has a history of heart murmur.  Last echo 2019 with no significant findings.  Discussed cardiology evaluation for further w/up.  She declines.  Wants to monitor.  Will notify me or be reevaluated if problems or if changes her mind.        Relevant Orders   CT HEAD WO CONTRAST (5MM) (Completed)   Headache    Persistent s/p head injury.  Persistent dizziness.  Obtain CT head to confirm no acute abnormality.       Hypercholesterolemia  On crestor.  Low cholesterol diet and exercise.  Follow lipid panel and liver function tests.        Hyperglycemia    Low carb diet and exercise.  Follow met b and a1c.       Hypertension    Reviewed outside blood pressure readings - averaging 117-120s/70s.   Continue losartan 45m q day and low dose amlodipine.  Blood pressure slightly elevated on my check. (1785systolic).  Will have her to continue to spot check her pressure. Follow pressures.  Follow metabolic panel.       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  On remeron now.  Has hydroxyzine to take prn.  Stopped trazodone and now off wellbutrin.  No suicidal ideations.  Continue f/u with psychiatry.        Syncope    Had the syncopal episode as  outlined.  Hit her head.  Has had some persistent head pain and dizziness.  Given that she hit her had and given persistent head pain and dizziness, discussed obtained head CT to confirm no acute bleed, etc.  Pt agreeable.  Discussed possible etiologies.  She has not been eating as well.  Took the medication as outlined.  Also discussed possible cardiac etiology.  EKG - appears to be SR.  No acute ischemic changes noted.  Will have cardiology confirm.  Murmur on exam. Has a history of heart murmur.  Last echo 2019 with no significant findings.  Discussed cardiology evaluation for further w/up.  She declines.  Wants to monitor.  Will notify me or be reevaluated if problems or if changes her mind. She is now off trazodone and wellbutrin.        Other Visit Diagnoses     Weakness    -  Primary   Relevant Orders   EKG 12-Lead (Completed)      I spent 45 minutes with the patient.  Time spent discussing her current concerns and symptoms.  Specifically time spent discussed her recent hospitalization, syncopal episode and further w/up with her and her husband. Time also spent discussing further w/up, evaluation and treatment.    CEinar Pheasant MD

## 2022-06-09 ENCOUNTER — Telehealth: Payer: Self-pay | Admitting: Internal Medicine

## 2022-06-09 ENCOUNTER — Other Ambulatory Visit: Payer: Self-pay

## 2022-06-09 ENCOUNTER — Encounter: Payer: Self-pay | Admitting: Internal Medicine

## 2022-06-09 MED ORDER — TIZANIDINE HCL 2 MG PO TABS: ORAL_TABLET | ORAL | 0 refills | Status: DC

## 2022-06-09 NOTE — Assessment & Plan Note (Addendum)
Reviewed outside blood pressure readings - averaging 117-120s/70s.   Continue losartan 25mg  q day and low dose amlodipine.  Blood pressure slightly elevated on my check. (140 systolic).  Will have her to continue to spot check her pressure. Follow pressures.  Follow metabolic panel.

## 2022-06-09 NOTE — Telephone Encounter (Signed)
Patient saw Dr Lorin Picket yesterday.  She came home from having a CT scan yesterday and developed a bad headache. She was wondering if Dr Lorin Picket could call her in something, over the counter medications are not working.

## 2022-06-09 NOTE — Assessment & Plan Note (Signed)
Low carb diet and exercise.  Follow met b and a1c.  

## 2022-06-09 NOTE — Addendum Note (Signed)
Addended by: Charm Barges on: 06/09/2022 05:41 AM   Modules accepted: Level of Service

## 2022-06-09 NOTE — Telephone Encounter (Signed)
She was having neck issues as well.  Question if her headache could be coming from her neck.  Can send in low dose muscle relaxer if agreeable.  Tazanidine tablets 2mg  q day prn.  #20 with no refills.  Do not take and drive.

## 2022-06-09 NOTE — Telephone Encounter (Signed)
Pt stated neck is stiff after having to lay down for CT yesterday, so thinks it may be coming from strain on neck. Tizanidine 2mg  QD PRN sent in to [facility]. Pt advised do not drive if takes medication.

## 2022-06-09 NOTE — Assessment & Plan Note (Signed)
Has been having issues with her neck.  Has a history of neck pain.  Discussed xray today.  Wants to hold.  Will notify me when agreeable.  Wants to hold on PT at this time.  Prefers to get her other issues addressed prior to going to PT.

## 2022-06-09 NOTE — Assessment & Plan Note (Addendum)
Recent syncopal episodes as outlined.  Discussed possible etiologies.  She has not been eating as well.  Took the medication as outlined.  Also discussed possible cardiac etiology.  EKG - appears to be SR.  No acute ischemic changes noted.  Will have cardiology confirm.  Murmur on exam. Has a history of heart murmur.  Last echo 2019 with no significant findings.  Discussed cardiology evaluation for further w/up.  She declines.  Wants to monitor.  Will notify me or be reevaluated if problems or if changes her mind.

## 2022-06-09 NOTE — Assessment & Plan Note (Signed)
On crestor.  Low cholesterol diet and exercise.  Follow lipid panel and liver function tests.   

## 2022-06-09 NOTE — Assessment & Plan Note (Signed)
Being followed closely by psychiatry.  On remeron now.  Has hydroxyzine to take prn.  Stopped trazodone and now off wellbutrin.  No suicidal ideations.  Continue f/u with psychiatry.

## 2022-06-19 ENCOUNTER — Ambulatory Visit
Admission: RE | Admit: 2022-06-19 | Discharge: 2022-06-19 | Disposition: A | Discharge status: Home / Self Care | Payer: Medicare PPO | Source: Ambulatory Visit | Attending: Internal Medicine | Admitting: Internal Medicine

## 2022-06-19 DIAGNOSIS — Z1231 Encounter for screening mammogram for malignant neoplasm of breast: Secondary | ICD-10-CM | POA: Diagnosis not present

## 2022-06-20 ENCOUNTER — Encounter: Payer: Self-pay | Admitting: Internal Medicine

## 2022-06-20 ENCOUNTER — Ambulatory Visit (INDEPENDENT_AMBULATORY_CARE_PROVIDER_SITE_OTHER): Payer: Medicare PPO | Admitting: Psychiatry

## 2022-06-20 ENCOUNTER — Encounter: Payer: Self-pay | Admitting: Psychiatry

## 2022-06-20 VITALS — BP 148/82 | HR 99 | Temp 98.7°F | Wt 128.0 lb

## 2022-06-20 DIAGNOSIS — F411 Generalized anxiety disorder: Secondary | ICD-10-CM | POA: Diagnosis not present

## 2022-06-20 DIAGNOSIS — F3341 Major depressive disorder, recurrent, in partial remission: Secondary | ICD-10-CM | POA: Diagnosis not present

## 2022-06-20 DIAGNOSIS — F4323 Adjustment disorder with mixed anxiety and depressed mood: Secondary | ICD-10-CM | POA: Diagnosis not present

## 2022-06-20 DIAGNOSIS — T50905D Adverse effect of unspecified drugs, medicaments and biological substances, subsequent encounter: Secondary | ICD-10-CM | POA: Diagnosis not present

## 2022-06-20 DIAGNOSIS — F332 Major depressive disorder, recurrent severe without psychotic features: Secondary | ICD-10-CM | POA: Diagnosis not present

## 2022-06-20 MED ORDER — MIRTAZAPINE 30 MG PO TABS: 30.0000 mg | ORAL_TABLET | Freq: Every day | ORAL | 0 refills | Status: DC

## 2022-06-20 NOTE — Progress Notes (Unsigned)
[facility] MD OP Progress Note  06/20/2022 10:30 AM AVANNI TURNBAUGH  MRN:  [id number]  Chief Complaint:  Chief Complaint  Patient presents with   Follow-up   76 year old Caucasian female with history of depression, anxiety, recent side effects to Wellbutrin presented for medication management.   HPI: TONDRA REIERSON is a 76 year old Caucasian female, married, retired, lives in [place], has a history of MDD, GAD, hyperlipidemia, arthritis was evaluated in office today.  Patient with recent side effects to Wellbutrin.  Patient with tinnitus, low appetite, elevated blood pressure reading and dizziness with fall recently.  Patient had a visit with her primary care provider Dr. Nicki Reaper dated 06/08/2022.  Reviewed notes per Dr. Laurance Flatten at that visit had EKG which showed sinus rhythm as well as CT scan which came back with no acute pathology.  Patient today reports her tinnitus has improved.  She reports she continues to have low appetite although she is making herself to have 3 meals a day.  Her weight on 06/08/2022 was 128 pounds and today it is the same.  Hence she has not lost any weight.  Patient reports she however continues to have low mood, low energy, and episodes of feeling overwhelmed on and off.  She had a recent eye infection and was treated with doxycycline.  Since the eye infection she reports her mood symptoms worsened although now that her infection is better she seems to be improving with regards to her mood as well.  She reports she sometimes wakes up in the middle of the night and has thoughts about not wanting to be here .  However currently denies any suicidality.  Patient reports she however has been sleeping better and currently takes melatonin low dosage of 3 mg as needed which helps.  Does have psychosocial stressors, relationship conflicts with her daughter, has not spoken to her in the past 5 years. Today is her birthday.  Patient agrees to talk to therapist.  Has upcoming appointment.   Currently follows up with her therapist on a weekly basis.  Patient denies any side effects to mirtazapine.  Although depressed and anxious is not interested in adding another medication at this time and would like to give more time with therapy.  She does have hydroxyzine available as needed which was prescribed during her most recent inpatient hospitalization.  Agreeable to use it as needed if she is overwhelmed.    Visit Diagnosis:    ICD-10-CM   1. MDD (major depressive disorder), recurrent, in partial remission (HCC)  F33.41 mirtazapine (REMERON) 30 MG tablet    2. GAD (generalized anxiety disorder)  F41.1 mirtazapine (REMERON) 30 MG tablet    3. Adverse effect of drug, subsequent encounter  T50.905D    wellbutrin      Past Psychiatric History: Reviewed past psychiatric history from progress note on 01/12/2020.  Past Medical History:  Past Medical History:  Diagnosis Date   Depression 2000   Heart murmur    Hypercholesterolemia Osteoporosis   Hyperlipemia     Past Surgical History:  Procedure Laterality Date   ABDOMINAL HYSTERECTOMY  1980   COLONOSCOPY WITH PROPOFOL N/A 03/05/2018   Procedure: COLONOSCOPY WITH PROPOFOL;  Surgeon: Toledo, [name], MD;  Location: [facility] ENDOSCOPY;  Service: Endoscopy;  Laterality: N/A;    Family Psychiatric History: Reviewed family psychiatric history from progress note on 01/12/2020.  Family History:  Family History  Problem Relation Age of Onset   Heart disease Mother    Diabetes Mother  Heart disease Father    Diabetes Father    Colon cancer Other    Congestive Heart Failure Brother    Colon cancer Brother    Mental illness Paternal Aunt    BRCA 1/2 Neg Hx    Breast cancer Neg Hx    Cowden syndrome Neg Hx    DES usage Neg Hx    Endometrial cancer Neg Hx    Li-Fraumeni syndrome Neg Hx    Ovarian cancer Neg Hx     Social History: Reviewed social history from progress note on 01/12/2020. Social History   Socioeconomic History    Marital status: Married    Spouse name: robert   Number of children: 2   Years of education: Not on file   Highest education level: Not on file  Occupational History   Not on file  Tobacco Use   Smoking status: Never   Smokeless tobacco: Never  Vaping Use   Vaping Use: Never used  Substance and Sexual Activity   Alcohol use: Yes    Alcohol/week: 0.0 standard drinks of alcohol    Comment: occasionally - socially during the summer a glass of wine or beer   Drug use: No   Sexual activity: Yes  Other Topics Concern   Not on file  Social History Narrative   Not on file   Social Determinants of Health   Financial Resource Strain: Low Risk  (02/27/2018)   Overall Financial Resource Strain (CARDIA)    Difficulty of Paying Living Expenses: Not hard at all  Food Insecurity: No Food Insecurity (02/27/2018)   Hunger Vital Sign    Worried About Running Out of Food in the Last Year: Never true    [place] in the Last Year: Never true  Transportation Needs: No Transportation Needs (02/27/2018)   PRAPARE - Hydrologist (Medical): No    Lack of Transportation (Non-Medical): No  Physical Activity: Sufficiently Active (03/11/2019)   Exercise Vital Sign    Days of Exercise per Week: 5 days    Minutes of Exercise per Session: 30 min  Stress: Stress Concern Present (11/13/2019)   [place]    Feeling of Stress : To some extent  Social Connections: Unknown (11/13/2019)   Social Connection and Isolation Panel [NHANES]    Frequency of Communication with Friends and Family: Not on file    Frequency of Social Gatherings with Friends and Family: Not on file    Attends Religious Services: Not on file    Active Member of Clubs or Organizations: Not on file    Attends Archivist Meetings: Not on file    Marital Status: Married    Allergies: No Known Allergies  Metabolic Disorder  Labs: Lab Results  Component Value Date   HGBA1C 6.3 03/21/2022   No results found for: "PROLACTIN" Lab Results  Component Value Date   CHOL 152 03/21/2022   TRIG 136.0 03/21/2022   HDL 57.30 03/21/2022   CHOLHDL 3 03/21/2022   VLDL 27.2 03/21/2022   LDLCALC 67 03/21/2022   [address] 71 11/18/2021   Lab Results  Component Value Date   TSH 4.46 03/21/2022   TSH 3.32 11/29/2020    Therapeutic Level Labs: No results found for: "LITHIUM" No results found for: "VALPROATE" No results found for: "CBMZ"  Current Medications: Current Outpatient Medications  Medication Sig Dispense Refill   amLODipine (NORVASC) 2.5 MG tablet Take 1 tablet (2.5  mg total) by mouth daily. In am 90 tablet 3   calcium carbonate (OS-CAL) 600 MG TABS tablet Take 600 mg by mouth 2 (two) times daily with a meal.     doxycycline (VIBRA-TABS) 100 MG tablet Take 100 mg by mouth 2 (two) times daily.     hydrOXYzine (ATARAX) 10 MG tablet Take 10 mg by mouth 3 (three) times daily as needed for anxiety.     losartan (COZAAR) 25 MG tablet Take 1 tablet (25 mg total) by mouth daily. 90 tablet 1   rosuvastatin (CRESTOR) 20 MG tablet TAKE 1 TABLET BY MOUTH AT BEDTIME (Patient taking differently: Take 20 mg by mouth at bedtime.) 90 tablet 3   tiZANidine (ZANAFLEX) 2 MG tablet One tablet daily as needed 20 tablet 0   VITAMIN D PO Take by mouth daily.     mirtazapine (REMERON) 30 MG tablet Take 1 tablet (30 mg total) by mouth at bedtime. 90 tablet 0   No current facility-administered medications for this visit.     Musculoskeletal: Strength & Muscle Tone: within normal limits Gait & Station: normal Patient leans: N/A  Psychiatric Specialty Exam: Review of Systems  HENT:  Positive for tinnitus (improving).   Psychiatric/Behavioral:  Positive for dysphoric mood. The patient is nervous/anxious.   All other systems reviewed and are negative.   Blood pressure (!) 148/82, pulse 99, temperature 98.7 F (37.1 C),  temperature source Temporal, weight 128 lb (58.1 kg), last menstrual period 11/27/1979.Body mass index is 22.67 kg/m.  General Appearance: Casual  Eye Contact:  Fair  Speech:  Clear and Coherent  Volume:  Normal  Mood:  Anxious and Depressed  Affect:  Congruent  Thought Process:  Goal Directed and Descriptions of Associations: Intact  Orientation:  Full (Time, Place, and Person)  Thought Content: Logical   Suicidal Thoughts:  No  Homicidal Thoughts:  No  Memory:  Immediate;   Fair Recent;   Fair Remote;   Fair  Judgement:  Fair  Insight:  Fair  Psychomotor Activity:  Normal  Concentration:  Concentration: Fair and Attention Span: Fair  Recall:  [employer] of Knowledge: Fair  Language: Fair  Akathisia:  No  Handed:  Right  AIMS (if indicated): done  Assets:  Communication Skills Desire for Improvement Housing Intimacy Social Support Transportation  ADL's:  Intact  Cognition: WNL  Sleep:   restless at times - improving   Screenings: [place] Office Visit from 06/20/2022 in [facility] Office Visit from 04/24/2022 in [facility] Total Score 0 0      AUDIT    Flowsheet Row Admission (Discharged) from 05/12/2022 in [facility] Admission (Discharged) from 12/17/2019 in [facility] Admission (Discharged) from OP Visit from 12/09/2012 in [facility]  Alcohol Use Disorder Identification Test Final Score (AUDIT) 0 0 0      GAD-7    Flowsheet Row Video Visit from 01/18/2022 in [facility] Video Visit from 05/17/2021 in [facility] Counselor from 01/14/2020 in [facility]  Total GAD-7 Score 0 0 5      Mini-Mental    [place] from 02/27/2018 in [facility] from 02/02/2017 in  [place] from 02/03/2016 in [facility]  Total Score (max 30 points ) 30 30 30  [place] Office Visit from 06/20/2022 in [facility] Office Visit from 06/08/2022 in [facility] Video Visit from 06/07/2022 in [facility] Video Visit from 05/10/2022 in [facility] [place] Office Visit from 04/24/2022 in [facility]  PHQ-2 Total Score 2 6 4 4 6   PHQ-9 Total Score 12 12 18 15 19       [place] Office Visit from 06/20/2022 in [facility] Most recent reading at 06/20/2022  9:51 AM Admission (Discharged) from 05/12/2022 in [facility] Most recent reading at 05/12/2022  3:00 PM ED from 05/12/2022 in [facility] Most recent reading at 05/12/2022 11:38 AM  C-SSRS RISK CATEGORY No Risk No Risk No Risk        Assessment and Plan: MELENDA BIELAK is a 76 year old Caucasian female, married, retired, lives in [place], has a history of MDD, anxiety, hyperlipidemia was evaluated in office today.  Patient is currently off of the Wellbutrin continues to have depression, anxiety however is not interested in making medication changes, discussed plan as noted below.  Plan MDD-some improvement. Mirtazapine 30 mg p.o. nightly. Continue melatonin 3 mg p.o. nightly as needed for sleep Continue CBT on a weekly basis.   GAD-unstable Mirtazapine 30 mg p.o. nightly Continue CBT with Ms. Dimas Aguas [facility]. Will consider adding another medication if her symptoms does not get better. Continue hydroxyzine 10 mg p.o. 3 times daily as needed for severe anxiety.  Adverse side effects to psychotropics-Wellbutrin-improving Patient stopped the Wellbutrin as well as trazodone. Reviewed notes per Dr. Filbert Schilder 06/08/2022  as noted above.  Patient with elevated blood pressure reading in session today, blood pressure was repeated, advised to follow up with primary care provider.  Follow-up in clinic in 4 weeks or sooner if needed.  This note was generated in part or whole with voice recognition software. Voice recognition is usually quite accurate but there are transcription errors that can and very often do occur. I apologize for any typographical errors that were not detected and corrected.      Ursula Alert, MD 06/21/2022, 8:19 AM

## 2022-06-27 ENCOUNTER — Ambulatory Visit: Payer: Medicare PPO

## 2022-06-28 ENCOUNTER — Encounter: Payer: Self-pay | Admitting: Internal Medicine

## 2022-06-28 ENCOUNTER — Ambulatory Visit: Payer: Medicare PPO | Admitting: Internal Medicine

## 2022-06-28 VITALS — BP 132/78 | HR 93 | Temp 98.0°F | Resp 16 | Ht 63.0 in | Wt 127.8 lb

## 2022-06-28 DIAGNOSIS — E538 Deficiency of other specified B group vitamins: Secondary | ICD-10-CM | POA: Diagnosis not present

## 2022-06-28 DIAGNOSIS — R55 Syncope and collapse: Secondary | ICD-10-CM | POA: Diagnosis not present

## 2022-06-28 DIAGNOSIS — R739 Hyperglycemia, unspecified: Secondary | ICD-10-CM | POA: Diagnosis not present

## 2022-06-28 DIAGNOSIS — F332 Major depressive disorder, recurrent severe without psychotic features: Secondary | ICD-10-CM | POA: Diagnosis not present

## 2022-06-28 DIAGNOSIS — E7800 Pure hypercholesterolemia, unspecified: Secondary | ICD-10-CM

## 2022-06-28 DIAGNOSIS — I10 Essential (primary) hypertension: Secondary | ICD-10-CM

## 2022-06-28 DIAGNOSIS — M542 Cervicalgia: Secondary | ICD-10-CM

## 2022-06-28 DIAGNOSIS — K635 Polyp of colon: Secondary | ICD-10-CM | POA: Diagnosis not present

## 2022-06-28 MED ORDER — CYANOCOBALAMIN 1000 MCG/ML IJ SOLN
1000.0000 ug | Freq: Once | INTRAMUSCULAR | Status: AC
  Administered 2022-06-28: 1000 ug via INTRAMUSCULAR

## 2022-06-28 NOTE — Progress Notes (Signed)
Patient ID: Angelica Robertson, female   DOB: 11-May-1946, 76 y.o.   MRN: [id number]   Subjective:    Patient ID: Angelica Robertson, female    DOB: 1946/09/24, 76 y.o.   MRN: [id number]   Patient here for a scheduled follow up. Marland Kitchen   HPI Here to follow up regarding her blood pressure, depression and neck pain.  Blood pressures on outside checks averaging 120-130/60-70s.  Seeing Dr Shea Evans.  Still feels down.  Is getting out more.  Going to church.  Out to restaurant.  No suicidal thoughts.  Only on remeron and taking melatonin.  No chest pain.  Discussed exercise.  No sob.  No cough or congestion.  Eating.  No vomiting.  Neck pain.     Past Medical History:  Diagnosis Date   Depression 2000   Heart murmur    Hypercholesterolemia Osteoporosis   Hyperlipemia    Past Surgical History:  Procedure Laterality Date   ABDOMINAL HYSTERECTOMY  1980   COLONOSCOPY WITH PROPOFOL N/A 03/05/2018   Procedure: COLONOSCOPY WITH PROPOFOL;  Surgeon: Toledo, [name], MD;  Location: [facility] ENDOSCOPY;  Service: Endoscopy;  Laterality: N/A;   Family History  Problem Relation Age of Onset   Heart disease Mother    Diabetes Mother    Heart disease Father    Diabetes Father    Colon cancer Other    Congestive Heart Failure Brother    Colon cancer Brother    Mental illness Paternal Aunt    BRCA 1/2 Neg Hx    Breast cancer Neg Hx    Cowden syndrome Neg Hx    DES usage Neg Hx    Endometrial cancer Neg Hx    Li-Fraumeni syndrome Neg Hx    Ovarian cancer Neg Hx    Social History   Socioeconomic History   Marital status: Married    Spouse name: robert   Number of children: 2   Years of education: Not on file   Highest education level: Not on file  Occupational History   Not on file  Tobacco Use   Smoking status: Never   Smokeless tobacco: Never  Vaping Use   Vaping Use: Never used  Substance and Sexual Activity   Alcohol use: Yes    Alcohol/week: 0.0 standard drinks of alcohol    Comment: occasionally -  socially during the summer a glass of wine or beer   Drug use: No   Sexual activity: Yes  Other Topics Concern   Not on file  Social History Narrative   Not on file   Social Determinants of Health   Financial Resource Strain: Low Risk  (02/27/2018)   Overall Financial Resource Strain (CARDIA)    Difficulty of Paying Living Expenses: Not hard at all  Food Insecurity: No Food Insecurity (02/27/2018)   Hunger Vital Sign    Worried About Running Out of Food in the Last Year: Never true    [place] in the Last Year: Never true  Transportation Needs: No Transportation Needs (02/27/2018)   PRAPARE - Hydrologist (Medical): No    Lack of Transportation (Non-Medical): No  Physical Activity: Sufficiently Active (03/11/2019)   Exercise Vital Sign    Days of Exercise per Week: 5 days    Minutes of Exercise per Session: 30 min  Stress: Stress Concern Present (11/13/2019)   [place]    Feeling of Stress : To some extent  Social Connections: Unknown (11/13/2019)   Social Connection and Isolation Panel [NHANES]    Frequency of Communication with Friends and Family: Not on file    Frequency of Social Gatherings with Friends and Family: Not on file    Attends Religious Services: Not on file    Active Member of Clubs or Organizations: Not on file    Attends Archivist Meetings: Not on file    Marital Status: Married     Review of Systems  Constitutional:  Negative for appetite change and unexpected weight change.  HENT:  Negative for congestion and sinus pressure.   Respiratory:  Negative for cough, chest tightness and shortness of breath.   Cardiovascular:  Negative for chest pain, palpitations and leg swelling.  Gastrointestinal:  Negative for abdominal pain, diarrhea, nausea and vomiting.  Genitourinary:  Negative for difficulty urinating and dysuria.  Musculoskeletal:  Positive  for neck pain. Negative for joint swelling and myalgias.  Skin:  Negative for color change and rash.  Neurological:  Negative for dizziness, light-headedness and headaches.  Psychiatric/Behavioral:  Negative for agitation and dysphoric mood.        Objective:     BP 132/78   Pulse 93   Temp 98 F (36.7 C) (Temporal)   Resp 16   Ht _0  (1.6 m)   Wt 127 lb 12.8 oz (58 kg)   LMP 11/27/1979   SpO2 97%   BMI 22.64 kg/m  Wt Readings from Last 3 Encounters:  06/28/22 127 lb 12.8 oz (58 kg)  06/20/22 128 lb (58.1 kg)  06/08/22 128 lb 12.8 oz (58.4 kg)    Physical Exam Vitals reviewed.  Constitutional:      General: She is not in acute distress.    Appearance: Normal appearance.  HENT:     Head: Normocephalic and atraumatic.     Right Ear: External ear normal.     Left Ear: External ear normal.  Eyes:     General: No scleral icterus.       Right eye: No discharge.        Left eye: No discharge.     Conjunctiva/sclera: Conjunctivae normal.  Neck:     Thyroid: No thyromegaly.  Cardiovascular:     Rate and Rhythm: Normal rate and regular rhythm.  Pulmonary:     Effort: No respiratory distress.     Breath sounds: Normal breath sounds. No wheezing.  Abdominal:     General: Bowel sounds are normal.     Palpations: Abdomen is soft.     Tenderness: There is no abdominal tenderness.  Musculoskeletal:        General: No swelling or tenderness.     Cervical back: Neck supple. No tenderness.  Lymphadenopathy:     Cervical: No cervical adenopathy.  Skin:    Findings: No erythema or rash.  Neurological:     Mental Status: She is alert.  Psychiatric:        Mood and Affect: Mood normal.        Behavior: Behavior normal.      Outpatient Encounter Medications as of 06/28/2022  Medication Sig   amLODipine (NORVASC) 2.5 MG tablet Take 1 tablet (2.5 mg total) by mouth daily. In am   calcium carbonate (OS-CAL) 600 MG TABS tablet Take 600 mg by mouth 2 (two) times daily with  a meal.   hydrOXYzine (ATARAX) 10 MG tablet Take 10 mg by mouth 3 (three) times daily as needed for anxiety.   losartan (  COZAAR) 25 MG tablet Take 1 tablet (25 mg total) by mouth daily.   Melatonin 3 MG CAPS Take by mouth.   mirtazapine (REMERON) 30 MG tablet Take 1 tablet (30 mg total) by mouth at bedtime.   rosuvastatin (CRESTOR) 20 MG tablet TAKE 1 TABLET BY MOUTH AT BEDTIME (Patient taking differently: Take 20 mg by mouth at bedtime.)   tiZANidine (ZANAFLEX) 2 MG tablet One tablet daily as needed   VITAMIN D PO Take by mouth daily.   [DISCONTINUED] doxycycline (VIBRA-TABS) 100 MG tablet Take 100 mg by mouth 2 (two) times daily.   [EXPIRED] cyanocobalamin ((VITAMIN B-12)) injection 1,000 mcg    No facility-administered encounter medications on file as of 06/28/2022.     Lab Results  Component Value Date   WBC 11.3 (H) 05/12/2022   HGB 13.6 05/12/2022   HCT 40.5 05/12/2022   PLT 327 05/12/2022   GLUCOSE 203 (H) 05/12/2022   CHOL 152 03/21/2022   TRIG 136.0 03/21/2022   HDL 57.30 03/21/2022   LDLCALC 67 03/21/2022   ALT 24 05/12/2022   AST 28 05/12/2022   NA 136 05/12/2022   K 3.6 05/12/2022   CL 101 05/12/2022   CREATININE 0.72 05/12/2022   BUN 11 05/12/2022   CO2 26 05/12/2022   TSH 4.46 03/21/2022   HGBA1C 6.3 03/21/2022    MM 3D SCREEN BREAST BILATERAL  Result Date: 06/19/2022 CLINICAL DATA:  Screening. EXAM: DIGITAL SCREENING BILATERAL MAMMOGRAM WITH TOMOSYNTHESIS AND CAD TECHNIQUE: Bilateral screening digital craniocaudal and mediolateral oblique mammograms were obtained. Bilateral screening digital breast tomosynthesis was performed. The images were evaluated with computer-aided detection. COMPARISON:  Previous exam(s). ACR Breast Density Category b: There are scattered areas of fibroglandular density. FINDINGS: There are no findings suspicious for malignancy. IMPRESSION: No mammographic evidence of malignancy. A result letter of this screening mammogram will be mailed  directly to the patient. RECOMMENDATION: Screening mammogram in one year. (Code:SM-B-01Y) BI-RADS CATEGORY  1: Negative. Electronically Signed   By: Claudie Revering M.D.   On: 06/19/2022 18:14       Assessment & Plan:   Problem List Items Addressed This Visit     B12 deficiency - Primary   Cervicalgia    Has been having issues with her neck.  Has a history of neck pain.have discussed xray.  Discussed PT.   Prefers to get her other issues addressed prior to further w/up and evaluation.  Notify me if desires any further w/up or evaluation.        Colon polyps    Colonoscopy 2019 - normal.  Non bleeding internal hemorrhoids.       Hypercholesterolemia    On crestor.  Low cholesterol diet and exercise.  Follow lipid panel and liver function tests.        Hyperglycemia    Low carb diet and exercise.  Follow met b and a1c.       Hypertension    Reviewed outside blood pressure readings.  Overall ok.  Continue losartan and amlodipine.  Follow pressures.  Follow metabolic panel.       MDD (major depressive disorder), recurrent severe, without psychosis ([place])    Being followed closely by psychiatry.  On remeron now.  Has hydroxyzine to take prn.  Stopped trazodone and off wellbutrin.  No suicidal ideations.  Continue f/u with psychiatry.        Syncope    Had the syncopal episode - outlined in last visit.  Discussed further w/up.  Declines.  Will  follow.         Einar Pheasant, MD

## 2022-06-29 ENCOUNTER — Telehealth: Payer: Self-pay

## 2022-06-29 DIAGNOSIS — F411 Generalized anxiety disorder: Secondary | ICD-10-CM

## 2022-06-29 DIAGNOSIS — F3341 Major depressive disorder, recurrent, in partial remission: Secondary | ICD-10-CM

## 2022-06-29 NOTE — Telephone Encounter (Signed)
pt called left message she states that you told her to call back if she decides to start a anti-depressant medication. she states she spoke with her primary doctor and she ready to try

## 2022-06-29 NOTE — Telephone Encounter (Signed)
Contacted patient to discuss medication changes.  Patient also provided information for TMS that we discussed last visit.  Patient reports she would like to talk to TMS contact person.  Will make the referral.

## 2022-06-30 ENCOUNTER — Telehealth (HOSPITAL_COMMUNITY): Payer: Self-pay | Admitting: Psychiatry

## 2022-06-30 NOTE — Telephone Encounter (Signed)
D:  Dr. Elna Breslow referred pt to TMS.  A:  Placed call to orient pt and get her a consult scheduled but there was no answer.  Left vm requesting pt to call the case manager.  Inform Dr. Elna Breslow and the TMS team.

## 2022-07-02 ENCOUNTER — Encounter: Payer: Self-pay | Admitting: Internal Medicine

## 2022-07-02 NOTE — Assessment & Plan Note (Signed)
On crestor.  Low cholesterol diet and exercise.  Follow lipid panel and liver function tests.   

## 2022-07-02 NOTE — Assessment & Plan Note (Signed)
Had the syncopal episode - outlined in last visit.  Discussed further w/up.  Declines.  Will follow.

## 2022-07-02 NOTE — Assessment & Plan Note (Signed)
Low carb diet and exercise.  Follow met b and a1c.  

## 2022-07-02 NOTE — Assessment & Plan Note (Signed)
Being followed closely by psychiatry.  On remeron now.  Has hydroxyzine to take prn.  Stopped trazodone and off wellbutrin.  No suicidal ideations.  Continue f/u with psychiatry.

## 2022-07-02 NOTE — Assessment & Plan Note (Signed)
Has been having issues with her neck.  Has a history of neck pain.have discussed xray.  Discussed PT.   Prefers to get her other issues addressed prior to further w/up and evaluation.  Notify me if desires any further w/up or evaluation.

## 2022-07-02 NOTE — Assessment & Plan Note (Addendum)
Colonoscopy 2019 - normal.  Non bleeding internal hemorrhoids.  

## 2022-07-02 NOTE — Assessment & Plan Note (Signed)
Reviewed outside blood pressure readings.  Overall ok.  Continue losartan and amlodipine.  Follow pressures.  Follow metabolic panel.

## 2022-07-04 ENCOUNTER — Telehealth (HOSPITAL_COMMUNITY): Payer: Self-pay | Admitting: Psychiatry

## 2022-07-04 NOTE — Telephone Encounter (Signed)
D:  Dr. Elna Breslow referred pt for TMS.  A:  Placed another call to pt.  Oriented pt; answered all her questions.  Did the preliminary questions with her.  Will email pt the [facility] PHQ-9, contact the insurance company and get pt scheduled for a consult appt with Dr. Renaldo Fiddler.  Pt is requesting an appt next week b/c she will be going out of town for a few days and returning 07-09-22.  Pt states she has been researching TMS and found out the treatment is four weeks.  Reiterated to pt that [facility] TMS is nine weeks; encouraged pt to discuss with the psychiatrist further during the consult.  "I'm just concerned about the distance because we live in [place] and that's a drive."  Reiterated to pt again that the first appt is the longest (1 hr) and afterwards it's only ~18-30 minutes (M thru F) then a 30 day f/u once treatments are completed.  Reassured pt that it goes fast once she gets started and the goal is for her to feel better.  Inform Dr. Demetrius Charity, Thurman Coyer, Dr. Elna Breslow and Everlene Balls, RN (clinical mgr).  R:  Pt receptive.

## 2022-07-05 ENCOUNTER — Telehealth (HOSPITAL_COMMUNITY): Payer: Self-pay | Admitting: Psychiatry

## 2022-07-05 NOTE — Telephone Encounter (Addendum)
D:  Patient completed the PHQ-9 but there answers didn't come through whenever she sent the email.  A:  Contacted pt to ask her the questions.  Pt scored a #22 on the [employer] PHQ-9 = severely depressed.  Will move forward with getting pt scheduled to start TMS.  Pt is scheduled for a consultation with Dr. Renaldo Fiddler on 07-10-22 @ 9 a.m..  Requested that pt arrive 30-45 minutes early in order to complete paperwork and to bring her insurance card.  Pt inquired if her husband could come with her for the appt?  Interim coordinator informed pt that Dr. Demetrius Charity had stated that would be fine with him.  Provided pt with the [place] address.  Mentioned that the case manager/interim coordinator would be there to greet her.  Informed pt that the coordinator would be contacting her insurance company for authorization before 07-10-22.  "I talked to my insurance company already to verify my benefits.  I didn't know this would be moving so quickly; I'm very surprised."  Provided pt with support.  Inform Dr. Demetrius Charity, Everlene Balls, RN (clinical mgr) and Thurman Coyer, RN R:  Pt receptive.

## 2022-07-07 ENCOUNTER — Telehealth (HOSPITAL_COMMUNITY): Payer: Self-pay | Admitting: Psychiatry

## 2022-07-07 NOTE — Telephone Encounter (Signed)
D:  Placed call to [facility], to obtain pre-authorization.  Spoke to [place].  Tina instructed the interim coordinator to fax various notes, etc to fax#[phone].  Provided with pending reference # [id number].  A:  Will fax all information either today or 07-10-22 whenever consult is completed with Dr. Renaldo Fiddler.  Inform treatment team.

## 2022-07-10 ENCOUNTER — Other Ambulatory Visit (HOSPITAL_COMMUNITY): Payer: Medicare PPO

## 2022-07-10 ENCOUNTER — Ambulatory Visit (HOSPITAL_BASED_OUTPATIENT_CLINIC_OR_DEPARTMENT_OTHER): Payer: Medicare PPO | Admitting: Student in an Organized Health Care Education/Training Program

## 2022-07-10 DIAGNOSIS — F411 Generalized anxiety disorder: Secondary | ICD-10-CM

## 2022-07-10 DIAGNOSIS — F331 Major depressive disorder, recurrent, moderate: Secondary | ICD-10-CM | POA: Diagnosis not present

## 2022-07-10 NOTE — Progress Notes (Signed)
[place] MD/PA/NP OP Progress Note  07/10/2022 3:53 PM AXEL MEAS  MRN:  [id number]  Chief Complaint: No chief complaint on file.  HPI:  Raeanne Deschler is a 76 yr old female who presents for [place] screening.  PPHx is significant for Depression and Anxiety, no suicide attempts or history of self injurious behavior, and 4 prior hospitalizations (latest [facility] 05/2022).   She presents with her husband.  She reports that she has had issues with depression since she was 85.  She reports that she has had 4 hospitalizations (latest [facility] 05/2022).  She reports failing multiple medications in the past due to ineffectiveness and side effects- Wellbutrin, Zoloft, Cymbalta, Lorazepam, Prozac, Trazodone.  She is currently on Remeron and has been on this for approximately 3 years with minimal results.  She reports that she has had significantly decreased appetite and reports making herself eat because she has no desire to eat.  Reports that her sleep is significantly worsened.  Her husband reports she will sleep about 3 hours at most and that it is fitful sleep.  She reports she does not get out of the house much anymore.    Discussed TMS and treatment regiment.  Discussed potential risk and side effects.  Confirmed that she has no implanted metal in her head.  She reports no history of seizures or significant head trauma.  She is agreeable to under going [place].   She reports no SI, HI, or AVH.  Discussed with them what to do in the event of a future crisis.  Discussed that she can return to the office, go to [facility], go to the nearest ED, or call 911 or [phone].   They reported understanding and had no concerns.   She reports chronic constipation.  Discussed increasing fluid intake and further addressing this with her PCP.  She reports no other concerns at present.  Visit Diagnosis:    ICD-10-CM   1. MDD (major depressive disorder), recurrent episode, moderate (HCC)  F33.1     2. Generalized anxiety disorder  F41.1       Past  Psychiatric History: Depression and Anxiety, no suicide attempts or history of self injurious behavior, and 4 prior hospitalizations (latest [facility] 05/2022).  Past Medical History:  Past Medical History:  Diagnosis Date   Depression 2000   Heart murmur    Hypercholesterolemia Osteoporosis   Hyperlipemia     Past Surgical History:  Procedure Laterality Date   ABDOMINAL HYSTERECTOMY  1980   COLONOSCOPY WITH PROPOFOL N/A 03/05/2018   Procedure: COLONOSCOPY WITH PROPOFOL;  Surgeon: Toledo, [name], MD;  Location: [facility] ENDOSCOPY;  Service: Endoscopy;  Laterality: N/A;    Family Psychiatric History: Paternal Aunt- some diagnosis. No history of Suicides  Family History:  Family History  Problem Relation Age of Onset   Heart disease Mother    Diabetes Mother    Heart disease Father    Diabetes Father    Colon cancer Other    Congestive Heart Failure Brother    Colon cancer Brother    Mental illness Paternal Aunt    BRCA 1/2 Neg Hx    Breast cancer Neg Hx    Cowden syndrome Neg Hx    DES usage Neg Hx    Endometrial cancer Neg Hx    Li-Fraumeni syndrome Neg Hx    Ovarian cancer Neg Hx     Social History:  Social History   Socioeconomic History   Marital status: Married    Spouse name: Herbie [place]  Number of children: 2   Years of education: Not on file   Highest education level: Not on file  Occupational History   Not on file  Tobacco Use   Smoking status: Never   Smokeless tobacco: Never  Vaping Use   Vaping Use: Never used  Substance and Sexual Activity   Alcohol use: Yes    Alcohol/week: 0.0 standard drinks of alcohol    Comment: occasionally - socially during the summer a glass of wine or beer   Drug use: No   Sexual activity: Yes  Other Topics Concern   Not on file  Social History Narrative   Not on file   Social Determinants of Health   Financial Resource Strain: Low Risk  (02/27/2018)   Overall Financial Resource Strain (CARDIA)    Difficulty of Paying  Living Expenses: Not hard at all  Food Insecurity: No Food Insecurity (02/27/2018)   Hunger Vital Sign    Worried About Running Out of Food in the Last Year: Never true    [place] in the Last Year: Never true  Transportation Needs: No Transportation Needs (02/27/2018)   PRAPARE - Hydrologist (Medical): No    Lack of Transportation (Non-Medical): No  Physical Activity: Sufficiently Active (03/11/2019)   Exercise Vital Sign    Days of Exercise per Week: 5 days    Minutes of Exercise per Session: 30 min  Stress: Stress Concern Present (11/13/2019)   [place]    Feeling of Stress : To some extent  Social Connections: Unknown (11/13/2019)   Social Connection and Isolation Panel [NHANES]    Frequency of Communication with Friends and Family: Not on file    Frequency of Social Gatherings with Friends and Family: Not on file    Attends Religious Services: Not on file    Active Member of Clubs or Organizations: Not on file    Attends Archivist Meetings: Not on file    Marital Status: Married    Allergies: No Known Allergies  Metabolic Disorder Labs: Lab Results  Component Value Date   HGBA1C 6.3 03/21/2022   No results found for: "PROLACTIN" Lab Results  Component Value Date   CHOL 152 03/21/2022   TRIG 136.0 03/21/2022   HDL 57.30 03/21/2022   CHOLHDL 3 03/21/2022   VLDL 27.2 03/21/2022   LDLCALC 67 03/21/2022   [place] 71 11/18/2021   Lab Results  Component Value Date   TSH 4.46 03/21/2022   TSH 3.32 11/29/2020    Therapeutic Level Labs: No results found for: "LITHIUM" No results found for: "VALPROATE" No results found for: "CBMZ"  Current Medications: Current Outpatient Medications  Medication Sig Dispense Refill   amLODipine (NORVASC) 2.5 MG tablet Take 1 tablet (2.5 mg total) by mouth daily. In am 90 tablet 3   calcium carbonate (OS-CAL) 600 MG TABS  tablet Take 600 mg by mouth 2 (two) times daily with a meal.     hydrOXYzine (ATARAX) 10 MG tablet Take 10 mg by mouth 3 (three) times daily as needed for anxiety.     losartan (COZAAR) 25 MG tablet Take 1 tablet (25 mg total) by mouth daily. 90 tablet 1   Melatonin 3 MG CAPS Take by mouth.     mirtazapine (REMERON) 30 MG tablet Take 1 tablet (30 mg total) by mouth at bedtime. 90 tablet 0   rosuvastatin (CRESTOR) 20 MG tablet TAKE 1 TABLET BY  MOUTH AT BEDTIME (Patient taking differently: Take 20 mg by mouth at bedtime.) 90 tablet 3   tiZANidine (ZANAFLEX) 2 MG tablet One tablet daily as needed 20 tablet 0   VITAMIN D PO Take by mouth daily.     No current facility-administered medications for this visit.     Musculoskeletal: Strength & Muscle Tone: within normal limits Gait & Station: normal Patient leans: N/A  Psychiatric Specialty Exam: Review of Systems  Respiratory:  Negative for shortness of breath.   Cardiovascular:  Negative for chest pain.  Gastrointestinal:  Positive for constipation (chronic). Negative for abdominal pain, diarrhea, nausea and vomiting.  Neurological:  Negative for dizziness, weakness and headaches.  Psychiatric/Behavioral:  Positive for sleep disturbance. Negative for self-injury and suicidal ideas. The patient is nervous/anxious.     Last menstrual period 11/27/1979.There is no height or weight on file to calculate BMI.  General Appearance: Casual and Fairly Groomed  Eye Contact:  Fair  Speech:  Clear and Coherent and Slow  Volume:  Decreased  Mood:  Anxious and Depressed  Affect:  Appropriate, Congruent, and Constricted  Thought Process:  Coherent  Orientation:  Full (Time, Place, and Person)  Thought Content:  mostly logical but occasionally ruminates and has trouble making decisions.     Suicidal Thoughts:  No  Homicidal Thoughts:  No  Memory:  Immediate;   Good Recent;   Good  Judgement:  Fair  Insight:  Fair  Psychomotor Activity:  Normal   Concentration:  Concentration: Fair and Attention Span: Fair  Recall:  Good  Fund of Knowledge: Good  Language: Good  Akathisia:  Negative  Handed:  Right  AIMS (if indicated): not done  Assets:  Communication Skills Desire for Improvement Financial Resources/Insurance Housing Resilience Social Support  ADL's:  Intact  Cognition: WNL  Sleep:  Poor   Screenings: [place] Office Visit from 06/20/2022 in [place] Office Visit from 04/24/2022 in [place] Total Score 0 0      AUDIT    Flowsheet Row Admission (Discharged) from 05/12/2022 in [place] Admission (Discharged) from 12/17/2019 in [place] Admission (Discharged) from OP Visit from 12/09/2012 in [place] [facility]  Alcohol Use Disorder Identification Test Final Score (AUDIT) 0 0 0      GAD-7    Flowsheet Row Office Visit from 07/10/2022 in [place] Video Visit from 01/18/2022 in [place] Video Visit from 05/17/2021 in [place] from 01/14/2020 in [place]  Total GAD-7 Score 18 0 0 [place] from 02/27/2018 in [place] from 02/02/2017 in [place] from 02/03/2016 in [facility]  Total Score (max 30 points ) _0 PHQ2-9    [place] Visit from 07/10/2022 in [place] Visit from 06/28/2022 in [place] Office Visit from 06/20/2022 in [place] Visit from 06/08/2022 in [facility] Video Visit from 06/07/2022 in [place]  PHQ-2 Total Score  _1 PHQ-9 Total Score _2 [place] Office Visit from 06/20/2022 in [place] Most recent reading at 06/20/2022  9:51 AM Admission (Discharged) from 05/12/2022 in [place] Most recent reading at 05/12/2022  3:00 PM ED from 05/12/2022 in [place] Most recent reading at 05/12/2022 11:38 AM  C-SSRS RISK CATEGORY No Risk No Risk No Risk        Assessment and Plan:  Fatuma Dowers is a 76 yr old female who presents for [place] screening.  PPHx is significant for Depression and Anxiety, no suicide attempts or history of self injurious behavior, and 4 prior hospitalizations (latest [facility] 05/2022).     On assessment today [place] scored the following: PHQ-9: 18 and GAD-7: 18 which is significantly worse than previous scores in the past month showing significant worsening of her symptoms.  Given her failure of 5 previous antidepressants and her current medication not controlling her symptoms she would be appropriate for [place].  Her husband is concerned for further decline and if she does continue to worsen may need to consider admission and ECT if she begins to have SI again.   Collaboration of Care:   Patient/Guardian was advised Release of Information must be obtained prior to any record release in order to collaborate their care with an outside provider. Patient/Guardian was advised if they have not already done so to contact the registration department to sign all necessary forms in order for [place] to release information regarding their care.   Consent: Patient/Guardian gives verbal consent for treatment and assignment of benefits for services provided during this visit. Patient/Guardian expressed understanding and agreed to proceed.    Briant Cedar, MD 07/10/2022, 3:53 PM

## 2022-07-11 ENCOUNTER — Other Ambulatory Visit: Payer: Self-pay | Admitting: Psychiatry

## 2022-07-11 DIAGNOSIS — F3341 Major depressive disorder, recurrent, in partial remission: Secondary | ICD-10-CM

## 2022-07-11 DIAGNOSIS — F411 Generalized anxiety disorder: Secondary | ICD-10-CM

## 2022-07-13 ENCOUNTER — Ambulatory Visit: Payer: Medicare PPO | Admitting: Internal Medicine

## 2022-07-13 ENCOUNTER — Telehealth: Payer: Self-pay | Admitting: Internal Medicine

## 2022-07-13 ENCOUNTER — Encounter: Payer: Self-pay | Admitting: Internal Medicine

## 2022-07-13 DIAGNOSIS — I10 Essential (primary) hypertension: Secondary | ICD-10-CM | POA: Diagnosis not present

## 2022-07-13 DIAGNOSIS — F332 Major depressive disorder, recurrent severe without psychotic features: Secondary | ICD-10-CM | POA: Diagnosis not present

## 2022-07-13 NOTE — Telephone Encounter (Signed)
S/w pt - stated she is very anxious -  Dr Elna Breslow suggested TMS treatment - and she found out this morning it has been approved by [employer]. Pt stated she is feeling very on edge about this treatment and wants to speak with you regarding your opinion about this treatment. Pt asking if it is at all possible if you can add her on for an appointment either today or tomorrow so she can speak with you. Pt stated can do virtual or in person visit, whichever you prefer.

## 2022-07-13 NOTE — Telephone Encounter (Signed)
I can work her in today - add on to end of day.  I may be running behind.  I am ok if she prefers to come in face to face.

## 2022-07-13 NOTE — Telephone Encounter (Signed)
Pt called stating she would like to make an appointment with the provider today and the provider told her to call if she had any other issues.

## 2022-07-13 NOTE — Progress Notes (Signed)
This encounter was created in error - please disregard.

## 2022-07-13 NOTE — Telephone Encounter (Signed)
Lm for pt to cb.

## 2022-07-13 NOTE — Progress Notes (Signed)
Patient ID: MAKYLA BYE, female   DOB: [birth year], 76 y.o.   MRN: [id number]   Subjective:    Patient ID: BERNITA BECKSTROM, female    DOB: [birth year], 76 y.o.   MRN: [id number]   Patient here for work in appt.   Chief Complaint  Patient presents with   Follow-up    [place] treatment, ordered by Dr.Eappen   .   HPI She has been having issues with increased depression and anxiety.  Recently admitted - 05/2022.  Has been on various medications previously to help with her depression, including wellbutrin, zoloft, cymbalta, lorazepam, prozac and trazodone.  Currently on remeron.  Eating and sleeping - affected.  Not wanting to get out of the house.  No SI or HI.  Seeing psychiatry.  Planning for [place] treatment.  Just wanted to come in and discuss.  She is planning to start treatment next week.  No vomiting.  No chest pain.  Breathing stable.     Past Medical History:  Diagnosis Date   Depression 2000   Heart murmur    Hypercholesterolemia Osteoporosis   Hyperlipemia    Past Surgical History:  Procedure Laterality Date   ABDOMINAL HYSTERECTOMY  1980   COLONOSCOPY WITH PROPOFOL N/A 03/05/2018   Procedure: COLONOSCOPY WITH PROPOFOL;  Surgeon: Toledo, [name], MD;  Location: [facility] ENDOSCOPY;  Service: Endoscopy;  Laterality: N/A;   Family History  Problem Relation Age of Onset   Heart disease Mother    Diabetes Mother    Heart disease Father    Diabetes Father    Colon cancer Other    Congestive Heart Failure Brother    Colon cancer Brother    Mental illness Paternal Aunt    BRCA 1/2 Neg Hx    Breast cancer Neg Hx    Cowden syndrome Neg Hx    DES usage Neg Hx    Endometrial cancer Neg Hx    Li-Fraumeni syndrome Neg Hx    Ovarian cancer Neg Hx    Social History   Socioeconomic History   Marital status: Married    Spouse name: robert   Number of children: 2   Years of education: Not on file   Highest education level: Not on file  Occupational History   Not on file  Tobacco  Use   Smoking status: Never   Smokeless tobacco: Never  Vaping Use   Vaping Use: Never used  Substance and Sexual Activity   Alcohol use: Yes    Alcohol/week: 0.0 standard drinks of alcohol    Comment: occasionally - socially during the summer a glass of wine or beer   Drug use: No   Sexual activity: Yes  Other Topics Concern   Not on file  Social History Narrative   Not on file   Social Determinants of Health   Financial Resource Strain: Low Risk  (02/27/2018)   Overall Financial Resource Strain (CARDIA)    Difficulty of Paying Living Expenses: Not hard at all  Food Insecurity: No Food Insecurity (02/27/2018)   Hunger Vital Sign    Worried About Running Out of Food in the Last Year: Never true    [place] in the Last Year: Never true  Transportation Needs: No Transportation Needs (02/27/2018)   PRAPARE - Hydrologist (Medical): No    Lack of Transportation (Non-Medical): No  Physical Activity: Sufficiently Active (03/11/2019)   Exercise Vital Sign    Days of Exercise per  Week: 5 days    Minutes of Exercise per Session: 30 min  Stress: Stress Concern Present (11/13/2019)   [place]    Feeling of Stress : To some extent  Social Connections: Unknown (11/13/2019)   Social Connection and Isolation Panel [NHANES]    Frequency of Communication with Friends and Family: Not on file    Frequency of Social Gatherings with Friends and Family: Not on file    Attends Religious Services: Not on file    Active Member of Clubs or Organizations: Not on file    Attends Archivist Meetings: Not on file    Marital Status: Married     Review of Systems  Constitutional:  Negative for appetite change and unexpected weight change.  HENT:  Negative for congestion and sinus pressure.   Respiratory:  Negative for cough, chest tightness and shortness of breath.   Cardiovascular:  Negative  for chest pain, palpitations and leg swelling.  Gastrointestinal:  Negative for abdominal pain, diarrhea, nausea and vomiting.  Genitourinary:  Negative for difficulty urinating and dysuria.  Musculoskeletal:  Negative for joint swelling and myalgias.  Skin:  Negative for color change and rash.  Neurological:  Negative for dizziness, light-headedness and headaches.  Psychiatric/Behavioral:  Positive for sleep disturbance. Negative for suicidal ideas.        Increased depression as outlined.        Objective:     BP 122/70 (BP Location: Left Arm, Patient Position: Sitting, Cuff Size: Normal)   Pulse 90   Temp (!) 97.5 F (36.4 C) (Oral)   Resp 14   Ht _0  (1.6 m)   Wt 128 lb 3.2 oz (58.2 kg)   LMP 11/27/1979   SpO2 98%   BMI 22.71 kg/m  Wt Readings from Last 3 Encounters:  07/13/22 128 lb 3.2 oz (58.2 kg)  06/28/22 127 lb 12.8 oz (58 kg)  06/20/22 128 lb (58.1 kg)    Physical Exam Vitals reviewed.  Constitutional:      General: She is not in acute distress.    Appearance: Normal appearance.  HENT:     Head: Normocephalic and atraumatic.     Right Ear: External ear normal.     Left Ear: External ear normal.  Eyes:     General: No scleral icterus.       Right eye: No discharge.        Left eye: No discharge.     Conjunctiva/sclera: Conjunctivae normal.  Neck:     Thyroid: No thyromegaly.  Cardiovascular:     Rate and Rhythm: Normal rate and regular rhythm.  Pulmonary:     Effort: No respiratory distress.     Breath sounds: Normal breath sounds. No wheezing.  Abdominal:     General: Bowel sounds are normal.     Palpations: Abdomen is soft.     Tenderness: There is no abdominal tenderness.  Musculoskeletal:        General: No swelling or tenderness.     Cervical back: Neck supple. No tenderness.  Lymphadenopathy:     Cervical: No cervical adenopathy.  Skin:    Findings: No erythema or rash.  Neurological:     Mental Status: She is alert.  Psychiatric:         Mood and Affect: Mood normal.        Behavior: Behavior normal.      Outpatient Encounter Medications as of 07/13/2022  Medication Sig  amLODipine (NORVASC) 2.5 MG tablet Take 1 tablet (2.5 mg total) by mouth daily. In am   calcium carbonate (OS-CAL) 600 MG TABS tablet Take 600 mg by mouth 2 (two) times daily with a meal.   hydrOXYzine (ATARAX) 10 MG tablet Take 10 mg by mouth 3 (three) times daily as needed for anxiety.   losartan (COZAAR) 25 MG tablet Take 1 tablet (25 mg total) by mouth daily.   Melatonin 3 MG CAPS Take by mouth.   mirtazapine (REMERON) 30 MG tablet Take 1 tablet (30 mg total) by mouth at bedtime.   rosuvastatin (CRESTOR) 20 MG tablet TAKE 1 TABLET BY MOUTH AT BEDTIME (Patient taking differently: Take 20 mg by mouth at bedtime.)   tiZANidine (ZANAFLEX) 2 MG tablet One tablet daily as needed   VITAMIN D PO Take by mouth daily.   No facility-administered encounter medications on file as of 07/13/2022.     Lab Results  Component Value Date   WBC 11.3 (H) 05/12/2022   HGB 13.6 05/12/2022   HCT 40.5 05/12/2022   PLT 327 05/12/2022   GLUCOSE 134 (H) 07/19/2022   CHOL 135 07/19/2022   TRIG 75.0 07/19/2022   HDL 69.90 07/19/2022   LDLCALC 50 07/19/2022   ALT 21 07/19/2022   AST 21 07/19/2022   NA 139 07/19/2022   K 4.1 07/19/2022   CL 100 07/19/2022   CREATININE 0.78 07/19/2022   BUN 15 07/19/2022   CO2 32 07/19/2022   TSH 4.46 03/21/2022   HGBA1C 6.6 (H) 07/19/2022    MM 3D SCREEN BREAST BILATERAL  Result Date: 06/19/2022 CLINICAL DATA:  Screening. EXAM: DIGITAL SCREENING BILATERAL MAMMOGRAM WITH TOMOSYNTHESIS AND CAD TECHNIQUE: Bilateral screening digital craniocaudal and mediolateral oblique mammograms were obtained. Bilateral screening digital breast tomosynthesis was performed. The images were evaluated with computer-aided detection. COMPARISON:  Previous exam(s). ACR Breast Density Category b: There are scattered areas of fibroglandular density.  FINDINGS: There are no findings suspicious for malignancy. IMPRESSION: No mammographic evidence of malignancy. A result letter of this screening mammogram will be mailed directly to the patient. RECOMMENDATION: Screening mammogram in one year. (Code:SM-B-01Y) BI-RADS CATEGORY  1: Negative. Electronically Signed   By: Claudie Revering M.D.   On: 06/19/2022 18:14       Assessment & Plan:   Problem List Items Addressed This Visit     Hypertension    Continue losartan and amlodipine.  Follow pressures.  Follow metabolic panel.       MDD (major depressive disorder), recurrent severe, without psychosis ([place])    Being followed closely by psychiatry.  On remeron now.  Has hydroxyzine to take prn.  Stopped trazodone and now off wellbutrin.  No suicidal ideations.  Continue f/u with psychiatry.  Planning for [place] treatment next week.  Discussed with her today.  Follow.         Einar Pheasant, MD

## 2022-07-13 NOTE — Telephone Encounter (Signed)
S/w pt - agreeable to office visit 430 today. Aware may be running behind - pt stated that is fine.  Confirmed with patient that nothing acute going on other than anxiety over TMS - pt stated no, she feels fine. Is having some constipation but knows it is a side effect of medication she is on and Dr Elna Breslow has already encouraged her to increase fiber intake. Is incorporating more fiber in diet.   Pt will be at the office at 430 today.

## 2022-07-14 ENCOUNTER — Telehealth (HOSPITAL_COMMUNITY): Payer: Self-pay | Admitting: Psychiatry

## 2022-07-14 NOTE — Telephone Encounter (Signed)
D:  Placed call to f/u on TMS pre-authorization.  According to [employer] (insurance rep), she faxed the interim coordinator the auth the other day.  A:  TMS is authorized for 35 treatments, plus one f/u appt; auth # [id number].  Placed call to inform pt.  Pt inquired about her copay.  Encouraged pt to f/u with member services to find that out.  Will inform the TMS team to see when pt can be scheduled for first MT.  Informed pt that the coordinator will call her back with an appt.  R:  Pt receptive.

## 2022-07-17 ENCOUNTER — Other Ambulatory Visit (HOSPITAL_COMMUNITY): Payer: Medicare PPO | Attending: Psychiatry | Admitting: *Deleted

## 2022-07-17 DIAGNOSIS — F332 Major depressive disorder, recurrent severe without psychotic features: Secondary | ICD-10-CM

## 2022-07-17 NOTE — Progress Notes (Unsigned)
Pt reported to [facility] for her cortical mapping and motor threshold determination for Repetitive Transcranial Magnetic Stimulation treatment for Major Depressive Disorder. Pt completed a PHQ-9 with a score of 18.  Prior to procedure, pt signed an informed consent agreement for TMS treatment. Pt's treatment area was found by applying single pulses to her left motor cortex, hunting along the anterior/posterior plane and along the superior oblique angle until the best motor response was elicited from the pt's right thumb. Using the [employer]'s proprietary MT Assist algorithm, which produced a calculated motor threshold of 1.57 SMT. Per these findings, pt's treatment parameter. With these parameters, the pt will receive 36 sessions of TMS according to the following protocol: 3000 pulses per session. After determining pt's tx parameters, coil was moved to the treatment location, and the first burst of pulses was applied at a reduced power of 80% MT. Pt was able to tolerate titration to 90% MT by the end of treatment. Pt reported no complaints, and stated that the stimulation was tolerable. Upon completion of mapping, pt completed a few treatment intervals for observation of side effects. Pt tolerated tx well. Pt departed from clinic without issue. Dr. Renaldo Fiddler completed cortical mapping.

## 2022-07-18 ENCOUNTER — Other Ambulatory Visit (HOSPITAL_BASED_OUTPATIENT_CLINIC_OR_DEPARTMENT_OTHER): Payer: Medicare PPO | Admitting: *Deleted

## 2022-07-18 DIAGNOSIS — F332 Major depressive disorder, recurrent severe without psychotic features: Secondary | ICD-10-CM | POA: Diagnosis not present

## 2022-07-18 NOTE — Progress Notes (Signed)
Patient reported to [facility] for her 2ND session of Repetitive Transcranial Magnetic Stimulation treatment for severe episode of recurrent major depressive disorder, without psychotic features. Patient presented with appropriate affect, level mood and denied any suicidal or homicidal ideations. Patient denies any other current symptoms and remains optimistic with continued TMS treatment. Patient reported no change in alcohol/substance use, caffeine consumption, sleep pattern or metal implant status since previous tx. She reported headache yesterday after treatment. Pt sat quietly with her eyes closed, occasionally tearful during the treatment.  Power was titrated to 95% for the duration of tx and will remain there until patient gets adjusted. Patient reported no complaints or discomfort. Patient departed post-treatment with no concerns or complaints.

## 2022-07-19 ENCOUNTER — Other Ambulatory Visit: Payer: Medicare PPO

## 2022-07-19 ENCOUNTER — Other Ambulatory Visit (INDEPENDENT_AMBULATORY_CARE_PROVIDER_SITE_OTHER): Payer: Medicare PPO

## 2022-07-19 ENCOUNTER — Other Ambulatory Visit (HOSPITAL_BASED_OUTPATIENT_CLINIC_OR_DEPARTMENT_OTHER): Payer: Medicare PPO | Admitting: *Deleted

## 2022-07-19 DIAGNOSIS — F332 Major depressive disorder, recurrent severe without psychotic features: Secondary | ICD-10-CM | POA: Diagnosis not present

## 2022-07-19 DIAGNOSIS — I10 Essential (primary) hypertension: Secondary | ICD-10-CM | POA: Diagnosis not present

## 2022-07-19 DIAGNOSIS — R739 Hyperglycemia, unspecified: Secondary | ICD-10-CM

## 2022-07-19 DIAGNOSIS — E7800 Pure hypercholesterolemia, unspecified: Secondary | ICD-10-CM | POA: Diagnosis not present

## 2022-07-19 LAB — LIPID PANEL
Cholesterol: 135 mg/dL (ref 0–200)
HDL: 69.9 mg/dL (ref >39.00)
LDL Cholesterol: 50 mg/dL (ref 0–99)
NonHDL: 64.71
Total CHOL/HDL Ratio: 2
Triglycerides: 75 mg/dL (ref 0.0–149.0)
VLDL: 15 mg/dL (ref 0.0–40.0)

## 2022-07-19 LAB — BASIC METABOLIC PANEL
BUN: 15 mg/dL (ref 6–23)
CO2: 32 mEq/L (ref 19–32)
Calcium: 10.2 mg/dL (ref 8.4–10.5)
Chloride: 100 mEq/L (ref 96–112)
Creatinine, Ser: 0.78 mg/dL (ref 0.40–1.20)
GFR: 75.07 mL/min (ref >60.00)
Glucose, Bld: 134 mg/dL — ABNORMAL HIGH (ref 70–99)
Potassium: 4.1 mEq/L (ref 3.5–5.1)
Sodium: 139 mEq/L (ref 135–145)

## 2022-07-19 LAB — HEPATIC FUNCTION PANEL
ALT: 21 U/L (ref 0–35)
AST: 21 U/L (ref 0–37)
Albumin: 4.6 g/dL (ref 3.5–5.2)
Alkaline Phosphatase: 54 U/L (ref 39–117)
Bilirubin, Direct: 0.1 mg/dL (ref 0.0–0.3)
Total Bilirubin: 0.6 mg/dL (ref 0.2–1.2)
Total Protein: 6.8 g/dL (ref 6.0–8.3)

## 2022-07-19 LAB — HEMOGLOBIN A1C: Hgb A1c MFr Bld: 6.6 % — ABNORMAL HIGH (ref 4.6–6.5)

## 2022-07-19 NOTE — Progress Notes (Signed)
Patient reported to [facility] for her 3rd session of Repetitive Transcranial Magnetic Stimulation treatment for severe episode of recurrent major depressive disorder, without psychotic features. Patient presented with appropriate affect, level mood and denied any suicidal or homicidal ideations. Patient denies any other current symptoms and remains optimistic with continued TMS treatment. Patient reported no change in alcohol/substance use, caffeine consumption, sleep pattern or metal implant status since previous tx. She reported headache yesterday after treatment. Pt sat quietly with her eyes closed, occasionally tearful during the treatment.  Power was titrated to 95% for the duration of tx and will remain there until patient gets adjusted. Patient reported no complaints or discomfort. Patient departed post-treatment with no concerns or complaints.

## 2022-07-20 ENCOUNTER — Other Ambulatory Visit (HOSPITAL_BASED_OUTPATIENT_CLINIC_OR_DEPARTMENT_OTHER): Payer: Medicare PPO | Admitting: *Deleted

## 2022-07-20 DIAGNOSIS — F332 Major depressive disorder, recurrent severe without psychotic features: Secondary | ICD-10-CM

## 2022-07-20 NOTE — Progress Notes (Signed)
Patient reported to [facility] for her 4th session of Repetitive Transcranial Magnetic Stimulation treatment for severe episode of recurrent major depressive disorder, without psychotic features. Patient presented with sad affect, level mood and denied any suicidal or homicidal ideations.Patient reported no change in alcohol/substance use, caffeine consumption, sleep pattern or metal implant status since previous tx. She reported headache yesterday after treatment. Pt sat quietly with her eyes closed, occasionally tearful during the treatment. Pt reports a lingering headache on the right side of her head. Recommended alternating Motrin and Tylenol. Power was titrated to 95% for the duration of tx and will remain there until patient gets adjusted. Patient reported no complaints. Patient departed post-treatment with concerns about ongoing headache.

## 2022-07-21 ENCOUNTER — Other Ambulatory Visit (HOSPITAL_COMMUNITY): Payer: Medicare PPO | Attending: Psychiatry | Admitting: *Deleted

## 2022-07-21 DIAGNOSIS — F332 Major depressive disorder, recurrent severe without psychotic features: Secondary | ICD-10-CM | POA: Diagnosis not present

## 2022-07-21 NOTE — Progress Notes (Signed)
Patient reported to [facility] for her 5th session of Repetitive Transcranial Magnetic Stimulation treatment for severe episode of recurrent major depressive disorder, without psychotic features. Patient presented with sad affect, level mood and denied any suicidal or homicidal ideations.Patient reported no change in alcohol/substance use, caffeine consumption, sleep pattern or metal implant status since previous tx. She reported headache yesterday after treatment. Pt sat quietly with her eyes closed, occasionally tearful during the treatment. Pt reports a lingering headache on the right side of her head. Recommended alternating Motrin and Tylenol. Power was titrated to 100% for the duration of tx and will remain there until patient gets adjusted. Patient reported no complaints.

## 2022-07-23 ENCOUNTER — Encounter: Payer: Self-pay | Admitting: Internal Medicine

## 2022-07-23 NOTE — Assessment & Plan Note (Signed)
Being followed closely by psychiatry.  On remeron now.  Has hydroxyzine to take prn.  Stopped trazodone and now off wellbutrin.  No suicidal ideations.  Continue f/u with psychiatry.  Planning for TMS treatment next week.  Discussed with her today.  Follow.

## 2022-07-23 NOTE — Assessment & Plan Note (Signed)
Continue losartan and amlodipine.  Follow pressures.  Follow metabolic panel.  

## 2022-07-24 ENCOUNTER — Other Ambulatory Visit (HOSPITAL_COMMUNITY): Payer: Medicare PPO | Attending: Psychiatry | Admitting: *Deleted

## 2022-07-24 ENCOUNTER — Telehealth: Payer: Self-pay | Admitting: Psychiatry

## 2022-07-24 ENCOUNTER — Ambulatory Visit (INDEPENDENT_AMBULATORY_CARE_PROVIDER_SITE_OTHER): Payer: Medicare PPO | Admitting: Internal Medicine

## 2022-07-24 ENCOUNTER — Encounter: Payer: Self-pay | Admitting: Internal Medicine

## 2022-07-24 ENCOUNTER — Telehealth (HOSPITAL_COMMUNITY): Payer: Self-pay | Admitting: Psychiatry

## 2022-07-24 VITALS — BP 134/80 | HR 103 | Temp 97.9°F | Resp 17 | Ht 63.0 in | Wt 125.4 lb

## 2022-07-24 DIAGNOSIS — R739 Hyperglycemia, unspecified: Secondary | ICD-10-CM | POA: Diagnosis not present

## 2022-07-24 DIAGNOSIS — F332 Major depressive disorder, recurrent severe without psychotic features: Secondary | ICD-10-CM

## 2022-07-24 DIAGNOSIS — Z0000 Encounter for general adult medical examination without abnormal findings: Secondary | ICD-10-CM | POA: Diagnosis not present

## 2022-07-24 DIAGNOSIS — D72829 Elevated white blood cell count, unspecified: Secondary | ICD-10-CM | POA: Diagnosis not present

## 2022-07-24 DIAGNOSIS — R42 Dizziness and giddiness: Secondary | ICD-10-CM

## 2022-07-24 DIAGNOSIS — E7800 Pure hypercholesterolemia, unspecified: Secondary | ICD-10-CM | POA: Diagnosis not present

## 2022-07-24 DIAGNOSIS — I10 Essential (primary) hypertension: Secondary | ICD-10-CM

## 2022-07-24 DIAGNOSIS — R519 Headache, unspecified: Secondary | ICD-10-CM

## 2022-07-24 LAB — CBC WITH DIFFERENTIAL/PLATELET
Basophils Absolute: 0.1 10*3/uL (ref 0.0–0.1)
Basophils Relative: 0.5 % (ref 0.0–3.0)
Eosinophils Absolute: 0.1 10*3/uL (ref 0.0–0.7)
Eosinophils Relative: 0.6 % (ref 0.0–5.0)
HCT: 40.7 % (ref 36.0–46.0)
Hemoglobin: 13.5 g/dL (ref 12.0–15.0)
Lymphocytes Relative: 13.9 % (ref 12.0–46.0)
Lymphs Abs: 1.8 10*3/uL (ref 0.7–4.0)
MCHC: 33.1 g/dL (ref 30.0–36.0)
MCV: 90.6 fl (ref 78.0–100.0)
Monocytes Absolute: 0.7 10*3/uL (ref 0.1–1.0)
Monocytes Relative: 5.6 % (ref 3.0–12.0)
Neutro Abs: 10.3 10*3/uL — ABNORMAL HIGH (ref 1.4–7.7)
Neutrophils Relative %: 79.4 % — ABNORMAL HIGH (ref 43.0–77.0)
Platelets: 365 10*3/uL (ref 150.0–400.0)
RBC: 4.49 Mil/uL (ref 3.87–5.11)
RDW: 13.1 % (ref 11.5–15.5)
WBC: 13 10*3/uL — ABNORMAL HIGH (ref 4.0–10.5)

## 2022-07-24 MED ORDER — ROSUVASTATIN CALCIUM 20 MG PO TABS: 20.0000 mg | ORAL_TABLET | Freq: Every day | ORAL | 3 refills | Status: DC

## 2022-07-24 MED ORDER — AMLODIPINE BESYLATE 2.5 MG PO TABS: 2.5000 mg | ORAL_TABLET | Freq: Every day | ORAL | 3 refills | Status: DC

## 2022-07-24 NOTE — Telephone Encounter (Signed)
Received message from Dr. Antonietta Jewel primary care provider that she wanted to discuss this patient's care.  Returned call to [employer].  Patient had a visit with Dr. Lorin Picket today and reported that she was having possible side effects to TMS, headaches as well as her mood symptoms for not getting better.  Patient also worried about weight loss.  Per primary care patient has not had significant weight loss-3 pounds in the past several weeks.  Patient also anxious that she likely has cancer since she has been having weight loss.   Agreed to discuss concerns with patient when she comes in for her appointment tomorrow.  We also discussed with TMS contact person.

## 2022-07-24 NOTE — Telephone Encounter (Signed)
D:  Patient started TMS on 07-17-22.  A:  Placed call to check on pt.  Pt c/o headaches.  On a scale of 1-10 (10 being the worst); pt rates it at a 5 today.  "Over the weekend it was a 10."  Provided pt with support.  Will inform TMS team.  R:  Pt receptive.

## 2022-07-24 NOTE — Assessment & Plan Note (Signed)
Physical today 07/24/22.  Mammogram 06/19/22 - Birads I.  Colonoscopy 03/05/18.

## 2022-07-24 NOTE — Progress Notes (Signed)
Patient reported to [facility] for her 6th session of Repetitive Transcranial Magnetic Stimulation treatment for severe episode of recurrent major depressive disorder, without psychotic features. Patient presented with sad affect, level mood and denied any suicidal or homicidal ideations.Patient reported no change in alcohol/substance use, caffeine consumption, sleep pattern or metal implant status since previous tx. She reported headache all weekend. Pt sat quietly with her eyes closed. Pt reports a headache, rated 4/10. Recommended alternating Motrin and Tylenol. Power was titrated to 80% for the duration of tx. Patient reported no complaints.

## 2022-07-24 NOTE — Progress Notes (Signed)
Patient ID: Angelica Robertson, female   DOB: 04-Jul-1946, 76 y.o.   MRN: [id number]   Subjective:    Patient ID: Angelica Robertson, female    DOB: 07-31-1946, 76 y.o.   MRN: [id number]   Patient here for her physical.   Chief Complaint  Patient presents with   Follow-up    Yearly CPE   .   HPI Has been having issues with depression and anxiety.  Started [facility]  - 5 treatments.  States she wished she had not started.  Describes bad headaches associated.  Does not feel any better.  Discussed treatments take time.  She plans to discuss with psych.  Also has appt with Dr Shea Evans tomorrow.  She is eating.  Still losing weight.  Concerned she has cancer.  Discussed.  No chest pain.  Breathing stable.  No nausea or vomiting.  No abdominal pain.  Bowels moving.  No SI.    Past Medical History:  Diagnosis Date   Depression 2000   Heart murmur    Hypercholesterolemia Osteoporosis   Hyperlipemia    Past Surgical History:  Procedure Laterality Date   ABDOMINAL HYSTERECTOMY  1980   COLONOSCOPY WITH PROPOFOL N/A 03/05/2018   Procedure: COLONOSCOPY WITH PROPOFOL;  Surgeon: Toledo, [name], MD;  Location: [facility] ENDOSCOPY;  Service: Endoscopy;  Laterality: N/A;   Family History  Problem Relation Age of Onset   Heart disease Mother    Diabetes Mother    Heart disease Father    Diabetes Father    Colon cancer Other    Congestive Heart Failure Brother    Colon cancer Brother    Mental illness Paternal Aunt    BRCA 1/2 Neg Hx    Breast cancer Neg Hx    Cowden syndrome Neg Hx    DES usage Neg Hx    Endometrial cancer Neg Hx    Li-Fraumeni syndrome Neg Hx    Ovarian cancer Neg Hx    Social History   Socioeconomic History   Marital status: Married    Spouse name: robert   Number of children: 2   Years of education: Not on file   Highest education level: Not on file  Occupational History   Not on file  Tobacco Use   Smoking status: Never   Smokeless tobacco: Never  Vaping Use   Vaping Use: Never  used  Substance and Sexual Activity   Alcohol use: Yes    Alcohol/week: 0.0 standard drinks of alcohol    Comment: occasionally - socially during the summer a glass of wine or beer   Drug use: No   Sexual activity: Yes  Other Topics Concern   Not on file  Social History Narrative   Not on file   Social Determinants of Health   Financial Resource Strain: Low Risk  (02/27/2018)   Overall Financial Resource Strain (CARDIA)    Difficulty of Paying Living Expenses: Not hard at all  Food Insecurity: No Food Insecurity (02/27/2018)   Hunger Vital Sign    Worried About Running Out of Food in the Last Year: Never true    [place] in the Last Year: Never true  Transportation Needs: No Transportation Needs (02/27/2018)   PRAPARE - Hydrologist (Medical): No    Lack of Transportation (Non-Medical): No  Physical Activity: Sufficiently Active (03/11/2019)   Exercise Vital Sign    Days of Exercise per Week: 5 days    Minutes of Exercise  per Session: 30 min  Stress: Stress Concern Present (11/13/2019)   [place]    Feeling of Stress : To some extent  Social Connections: Unknown (11/13/2019)   Social Connection and Isolation Panel [NHANES]    Frequency of Communication with Friends and Family: Not on file    Frequency of Social Gatherings with Friends and Family: Not on file    Attends Religious Services: Not on file    Active Member of Clubs or Organizations: Not on file    Attends Archivist Meetings: Not on file    Marital Status: Married     Review of Systems  Constitutional:        Eating.  Some weight loss.    HENT:  Negative for congestion and sinus pressure.   Respiratory:  Negative for cough, chest tightness and shortness of breath.   Cardiovascular:  Negative for chest pain, palpitations and leg swelling.  Gastrointestinal:  Negative for abdominal pain, diarrhea, nausea  and vomiting.  Genitourinary:  Negative for difficulty urinating and dysuria.  Musculoskeletal:  Negative for joint swelling and myalgias.  Skin:  Negative for color change and rash.  Neurological:  Positive for headaches. Negative for dizziness.  Psychiatric/Behavioral:  Negative for suicidal ideas.        Increased depression as outlined.  Increased stress.        Objective:     BP 134/80 (BP Location: Left Arm, Patient Position: Sitting, Cuff Size: Small)   Pulse (!) 103   Temp 97.9 F (36.6 C) (Temporal)   Resp 17   Ht _0  (1.6 m)   Wt 125 lb 6.4 oz (56.9 kg)   LMP 11/27/1979   SpO2 99%   BMI 22.21 kg/m  Wt Readings from Last 3 Encounters:  07/24/22 125 lb 6.4 oz (56.9 kg)  07/13/22 128 lb 3.2 oz (58.2 kg)  06/28/22 127 lb 12.8 oz (58 kg)    Physical Exam Vitals reviewed.  Constitutional:      General: She is not in acute distress.    Appearance: Normal appearance. She is well-developed.  HENT:     Head: Normocephalic and atraumatic.     Right Ear: External ear normal.     Left Ear: External ear normal.  Eyes:     General: No scleral icterus.       Right eye: No discharge.        Left eye: No discharge.     Conjunctiva/sclera: Conjunctivae normal.  Neck:     Thyroid: No thyromegaly.  Cardiovascular:     Rate and Rhythm: Normal rate and regular rhythm.  Pulmonary:     Effort: No tachypnea, accessory muscle usage or respiratory distress.     Breath sounds: Normal breath sounds. No decreased breath sounds or wheezing.  Chest:  Breasts:    Right: No inverted nipple, mass, nipple discharge or tenderness (no axillary adenopathy).     Left: No inverted nipple, mass, nipple discharge or tenderness (no axilarry adenopathy).  Abdominal:     General: Bowel sounds are normal.     Palpations: Abdomen is soft.     Tenderness: There is no abdominal tenderness.  Musculoskeletal:        General: No swelling or tenderness.     Cervical back: Neck supple. No  tenderness.  Lymphadenopathy:     Cervical: No cervical adenopathy.  Skin:    Findings: No erythema or rash.  Neurological:  Mental Status: She is alert and oriented to person, place, and time.  Psychiatric:        Mood and Affect: Mood normal.        Behavior: Behavior normal.      Outpatient Encounter Medications as of 07/24/2022  Medication Sig   calcium carbonate (OS-CAL) 600 MG TABS tablet Take 600 mg by mouth 2 (two) times daily with a meal.   losartan (COZAAR) 25 MG tablet Take 1 tablet (25 mg total) by mouth daily.   Melatonin 3 MG CAPS Take 6 mg by mouth.   VITAMIN D PO Take by mouth daily.   [DISCONTINUED] amLODipine (NORVASC) 2.5 MG tablet Take 1 tablet (2.5 mg total) by mouth daily. In am   [DISCONTINUED] hydrOXYzine (ATARAX) 10 MG tablet Take 10 mg by mouth 3 (three) times daily as needed for anxiety. (Patient not taking: Reported on 07/25/2022)   [DISCONTINUED] mirtazapine (REMERON) 30 MG tablet Take 1 tablet (30 mg total) by mouth at bedtime.   [DISCONTINUED] rosuvastatin (CRESTOR) 20 MG tablet TAKE 1 TABLET BY MOUTH AT BEDTIME (Patient taking differently: Take 20 mg by mouth at bedtime.)   amLODipine (NORVASC) 2.5 MG tablet Take 1 tablet (2.5 mg total) by mouth daily. In am   rosuvastatin (CRESTOR) 20 MG tablet Take 1 tablet (20 mg total) by mouth at bedtime.   [DISCONTINUED] tiZANidine (ZANAFLEX) 2 MG tablet One tablet daily as needed (Patient not taking: Reported on 07/24/2022)   No facility-administered encounter medications on file as of 07/24/2022.     Lab Results  Component Value Date   WBC 13.0 (H) 07/24/2022   HGB 13.5 07/24/2022   HCT 40.7 07/24/2022   PLT 365.0 07/24/2022   GLUCOSE 134 (H) 07/19/2022   CHOL 135 07/19/2022   TRIG 75.0 07/19/2022   HDL 69.90 07/19/2022   LDLCALC 50 07/19/2022   ALT 21 07/19/2022   AST 21 07/19/2022   NA 139 07/19/2022   K 4.1 07/19/2022   CL 100 07/19/2022   CREATININE 0.78 07/19/2022   BUN 15 07/19/2022   CO2  32 07/19/2022   TSH 4.46 03/21/2022   HGBA1C 6.6 (H) 07/19/2022    MM 3D SCREEN BREAST BILATERAL  Result Date: 06/19/2022 CLINICAL DATA:  Screening. EXAM: DIGITAL SCREENING BILATERAL MAMMOGRAM WITH TOMOSYNTHESIS AND CAD TECHNIQUE: Bilateral screening digital craniocaudal and mediolateral oblique mammograms were obtained. Bilateral screening digital breast tomosynthesis was performed. The images were evaluated with computer-aided detection. COMPARISON:  Previous exam(s). ACR Breast Density Category b: There are scattered areas of fibroglandular density. FINDINGS: There are no findings suspicious for malignancy. IMPRESSION: No mammographic evidence of malignancy. A result letter of this screening mammogram will be mailed directly to the patient. RECOMMENDATION: Screening mammogram in one year. (Code:SM-B-01Y) BI-RADS CATEGORY  1: Negative. Electronically Signed   By: Claudie Revering M.D.   On: 06/19/2022 18:14       Assessment & Plan:   Problem List Items Addressed This Visit     Headache    Headache with treatments.  Discussed with psychiatry.  Plans f/u today and tomorrow as outlined to determine further treatment.       Relevant Medications   amLODipine (NORVASC) 2.5 MG tablet   Healthcare maintenance    Physical today 07/24/22.  Mammogram 06/19/22 - Birads I.  Colonoscopy 03/05/18.        Hypercholesterolemia    On crestor.  Follow lipid panel and liver function tests.        Relevant Medications  amLODipine (NORVASC) 2.5 MG tablet   rosuvastatin (CRESTOR) 20 MG tablet   Hyperglycemia    Follow met b and a1c.       Hypertension    Continue losartan and amlodipine.  Follow pressures.  Follow metabolic panel.       Relevant Medications   amLODipine (NORVASC) 2.5 MG tablet   rosuvastatin (CRESTOR) 20 MG tablet   Leukocytosis - Primary    Repeat cbc today.       Relevant Orders   CBC with Differential/Platelet (Completed)   MDD (major depressive disorder), recurrent severe,  without psychosis ([place])    Being followed closely by psychiatry.  On remeron now.   Stopped trazodone and off wellbutrin.  No suicidal ideations. Has had 5 TMS treatments.  Reports increased headache.  Discussed treatments.  Discussed the need to discuss with psychiatry regarding continued treatment/recommendations.  No SI.  She is in agreement.  Discussed will contact psychiatry and discuss prior to her appt.  Agreeable.    Addendum:  Discussed with Dr Shea Evans regarding above.  Ms Atilano has appt with her tomorrow.          Other Visit Diagnoses     Dizziness       Relevant Medications   amLODipine (NORVASC) 2.5 MG tablet        Einar Pheasant, MD

## 2022-07-25 ENCOUNTER — Other Ambulatory Visit (HOSPITAL_COMMUNITY): Payer: Medicare PPO | Attending: Psychiatry | Admitting: *Deleted

## 2022-07-25 ENCOUNTER — Encounter: Payer: Self-pay | Admitting: Psychiatry

## 2022-07-25 ENCOUNTER — Telehealth (HOSPITAL_COMMUNITY): Payer: Self-pay | Admitting: Psychiatry

## 2022-07-25 ENCOUNTER — Telehealth (INDEPENDENT_AMBULATORY_CARE_PROVIDER_SITE_OTHER): Payer: Medicare PPO | Admitting: Psychiatry

## 2022-07-25 DIAGNOSIS — F332 Major depressive disorder, recurrent severe without psychotic features: Secondary | ICD-10-CM | POA: Diagnosis not present

## 2022-07-25 DIAGNOSIS — F411 Generalized anxiety disorder: Secondary | ICD-10-CM

## 2022-07-25 MED ORDER — MIRTAZAPINE 15 MG PO TABS: 22.5000 mg | ORAL_TABLET | Freq: Every day | ORAL | 0 refills | Status: DC

## 2022-07-25 NOTE — Progress Notes (Signed)
Virtual Visit via Video Note  I connected with Angelica Robertson on 07/25/22 at  8:30 AM EDT by a video enabled telemedicine application and verified that I am speaking with the correct person using two identifiers.  Location Provider Location : [facility] Patient Location : Home  Participants: Patient , Provider   I discussed the limitations of evaluation and management by telemedicine and the availability of in person appointments. The patient expressed understanding and agreed to proceed.  I discussed the assessment and treatment plan with the patient. The patient was provided an opportunity to ask questions and all were answered. The patient agreed with the plan and demonstrated an understanding of the instructions.   The patient was advised to call back or seek an in-person evaluation if the symptoms worsen or if the condition fails to improve as anticipated.    [place] MD OP Progress Note  07/25/2022 9:23 PM Angelica Robertson  MRN:  [id number]  Chief Complaint:  Chief Complaint  Patient presents with   Follow-up: 76 year old Caucasian female with history of depression, anxiety, currently undergoing [place], presented for medication management.   HPI: Angelica Robertson is a 76 year old Caucasian female, married, retired, lives in [place], has a history of MDD, GAD, hyperlipidemia, arthritis was evaluated by telemedicine today.  Patient is currently undergoing [place] therapy.  She has had 6 sessions so far per her report.  Patient reports however she has been struggling with headaches since starting [facility] therapy.  She reports the strength was increased on Friday and throughout the weekend she struggled with significant headaches.  Collateral information was obtained from husband-Angelica Robertson who also confirms that patient could not even get out of the bed through the weekend due to these headaches.  Patient reports she took Tylenol as needed and is making sure she is staying well-hydrated.  She reports yesterday the TMS  strength was reduced and although she does still have headache is not as bad as what she had over the weekend.  Patient also had a visit with her primary care provider-Dr. Nicki Reaper, discussed her headaches with provider .  Patient also discussed concerns about weight loss, and worsening mood symptoms.  Dr. Nicki Reaper attempted to reach out to writer yesterday and we coordinated care.  This was discussed at length with patient in session today.  Patient reports she was prescribed Ensure per Dr. Nicki Reaper and she has been trying to add that to her diet.  She struggles with low appetite however she has been making sure she eats 3 meals a day.  She may have lost 3 pounds in the past several weeks.  Per review of records her weight on 06/20/2022 was 128 pounds.  On 07/24/2022-per review of Dr. Bary Leriche notes-125 pounds.  Husband also reports that her weight loss is better compared to how it was previously.  Patient continues to struggle with sadness, low motivation, low energy, anxiety, rumination.  She also reports she struggles with sleep mostly because of the restless leg symptoms.  She reports since being on the higher dosage of mirtazapine restless leg symptoms have worsened.  She is interested in a dosage reduction.  Patient currently denies any suicidality, homicidality or perceptual disturbances.  Patient however throughout the session appeared to answer questions stating ' I do not know " , as well as seemed to be frustrated likely due to her headaches.    Visit Diagnosis:    ICD-10-CM   1. Major depressive disorder, recurrent, severe without psychotic features ([place])  F33.2 mirtazapine (  REMERON) 15 MG tablet    2. Generalized anxiety disorder  F41.1 mirtazapine (REMERON) 15 MG tablet      Past Psychiatric History: Reviewed past psychiatric history from progress note on 01/12/2020.  Past Medical History:  Past Medical History:  Diagnosis Date   Depression 2000   Heart murmur    Hypercholesterolemia  Osteoporosis   Hyperlipemia     Past Surgical History:  Procedure Laterality Date   ABDOMINAL HYSTERECTOMY  1980   COLONOSCOPY WITH PROPOFOL N/A 03/05/2018   Procedure: COLONOSCOPY WITH PROPOFOL;  Surgeon: Toledo, [name], MD;  Location: [facility] ENDOSCOPY;  Service: Endoscopy;  Laterality: N/A;    Family Psychiatric History: Reviewed family psychiatric history from progress note on 01/12/2020.  Family History:  Family History  Problem Relation Age of Onset   Heart disease Mother    Diabetes Mother    Heart disease Father    Diabetes Father    Colon cancer Other    Congestive Heart Failure Brother    Colon cancer Brother    Mental illness Paternal Aunt    BRCA 1/2 Neg Hx    Breast cancer Neg Hx    Cowden syndrome Neg Hx    DES usage Neg Hx    Endometrial cancer Neg Hx    Li-Fraumeni syndrome Neg Hx    Ovarian cancer Neg Hx     Social History: Reviewed social history from progress note on 01/12/2020. Social History   Socioeconomic History   Marital status: Married    Spouse name: Angelica Robertson   Number of children: 2   Years of education: Not on file   Highest education level: Not on file  Occupational History   Not on file  Tobacco Use   Smoking status: Never   Smokeless tobacco: Never  Vaping Use   Vaping Use: Never used  Substance and Sexual Activity   Alcohol use: Yes    Alcohol/week: 0.0 standard drinks of alcohol    Comment: occasionally - socially during the summer a glass of wine or beer   Drug use: No   Sexual activity: Yes  Other Topics Concern   Not on file  Social History Narrative   Not on file   Social Determinants of Health   Financial Resource Strain: Low Risk  (02/27/2018)   Overall Financial Resource Strain (CARDIA)    Difficulty of Paying Living Expenses: Not hard at all  Food Insecurity: No Food Insecurity (02/27/2018)   Hunger Vital Sign    Worried About Running Out of Food in the Last Year: Never true    Peters in the Last Year: Never  true  Transportation Needs: No Transportation Needs (02/27/2018)   PRAPARE - Hydrologist (Medical): No    Lack of Transportation (Non-Medical): No  Physical Activity: Sufficiently Active (03/11/2019)   Exercise Vital Sign    Days of Exercise per Week: 5 days    Minutes of Exercise per Session: 30 min  Stress: Stress Concern Present (11/13/2019)   [place]    Feeling of Stress : To some extent  Social Connections: Unknown (11/13/2019)   Social Connection and Isolation Panel [NHANES]    Frequency of Communication with Friends and Family: Not on file    Frequency of Social Gatherings with Friends and Family: Not on file    Attends Religious Services: Not on file    Active Member of Clubs or Organizations: Not on file  Attends Archivist Meetings: Not on file    Marital Status: Married    Allergies: No Known Allergies  Metabolic Disorder Labs: Lab Results  Component Value Date   HGBA1C 6.6 (H) 07/19/2022   No results found for: "PROLACTIN" Lab Results  Component Value Date   CHOL 135 07/19/2022   TRIG 75.0 07/19/2022   HDL 69.90 07/19/2022   CHOLHDL 2 07/19/2022   VLDL 15.0 07/19/2022   LDLCALC 50 07/19/2022   LDLCALC 67 03/21/2022   Lab Results  Component Value Date   TSH 4.46 03/21/2022   TSH 3.32 11/29/2020    Therapeutic Level Labs: No results found for: "LITHIUM" No results found for: "VALPROATE" No results found for: "CBMZ"  Current Medications: Current Outpatient Medications  Medication Sig Dispense Refill   Melatonin 3 MG CAPS Take 6 mg by mouth.     mirtazapine (REMERON) 15 MG tablet Take 1.5 tablets (22.5 mg total) by mouth at bedtime. 45 tablet 0   rosuvastatin (CRESTOR) 20 MG tablet Take 1 tablet (20 mg total) by mouth at bedtime. 90 tablet 3   VITAMIN D PO Take by mouth daily.     amLODipine (NORVASC) 2.5 MG tablet Take 1 tablet (2.5 mg total) by  mouth daily. In am 90 tablet 3   calcium carbonate (OS-CAL) 600 MG TABS tablet Take 600 mg by mouth 2 (two) times daily with a meal.     hydrOXYzine (ATARAX) 10 MG tablet Take 10 mg by mouth 3 (three) times daily as needed for anxiety. (Patient not taking: Reported on 07/25/2022)     losartan (COZAAR) 25 MG tablet Take 1 tablet (25 mg total) by mouth daily. 90 tablet 1   tiZANidine (ZANAFLEX) 2 MG tablet One tablet daily as needed (Patient not taking: Reported on 07/24/2022) 20 tablet 0   No current facility-administered medications for this visit.     Musculoskeletal: Strength & Muscle Tone:  UTA Gait & Station:  Seated Patient leans: N/A  Psychiatric Specialty Exam: Review of Systems  Constitutional:  Positive for fatigue.  Neurological:  Positive for headaches.  Psychiatric/Behavioral:  Positive for decreased concentration, dysphoric mood and sleep disturbance. The patient is nervous/anxious.   All other systems reviewed and are negative.   Last menstrual period 11/27/1979.There is no height or weight on file to calculate BMI.  General Appearance: Casual  Eye Contact:  Minimal  Speech:  Clear and Coherent  Volume:  Normal  Mood:  Anxious and Depressed  Affect:  Depressed  Thought Process:  Goal Directed and Descriptions of Associations: Intact  Orientation:  Full (Time, Place, and Person)  Thought Content: Logical   Suicidal Thoughts:  No  Homicidal Thoughts:  No  Memory:  Immediate;   Fair Recent;   Fair Remote;   Fair  Judgement:  Fair  Insight:  Fair  Psychomotor Activity:  Normal  Concentration:  Concentration: Fair and Attention Span: Fair  Recall:  [employer] of Knowledge: Fair  Language: Fair  Akathisia:  No  Handed:  Right  AIMS (if indicated): not done  Assets:  Desire for Improvement Housing Intimacy Transportation  ADL's:  Intact  Cognition: WNL  Sleep:  Poor   Screenings: [facility] Office Visit from 06/20/2022 in [facility] Office Visit from 04/24/2022 in [facility] Total Score 0 0      AUDIT    Flowsheet Row Admission (Discharged) from 05/12/2022 in [facility]  Admission (Discharged) from 12/17/2019 in [facility] Admission (Discharged) from OP Visit from 12/09/2012 in [facility]  Alcohol Use Disorder Identification Test Final Score (AUDIT) 0 0 0      GAD-7    Flowsheet Row Office Visit from 07/10/2022 in [facility] Video Visit from 01/18/2022 in [facility] Video Visit from 05/17/2021 in [facility] Counselor from 01/14/2020 in [facility]  Total GAD-7 Score 18 0 0 5      Mini-Mental    Flowsheet Row Clinical Support from 02/27/2018 in [facility] from 02/02/2017 in [facility] from 02/03/2016 in [facility]  Total Score (max 30 points ) 30 30 30       PHQ2-9    Flowsheet Row Video Visit from 07/25/2022 in [facility] from 07/24/2022 in [facility] Visit from 07/13/2022 in [facility] Office Visit from 07/10/2022 in [facility] Visit from 06/28/2022 in [facility]  PHQ-2 Total Score 6 6 6 6 2   PHQ-9 Total Score 17 22 20 18 10       Flowsheet Row Video Visit from 07/25/2022 in [facility] Office Visit from 06/20/2022 in [facility] Admission (Discharged) from 05/12/2022 in [facility] No Risk No Risk No Risk        Assessment and Plan: LASHONNE SHULL is a 76 year old Caucasian female, married, retired, lives in [place] has a  history of MDD, anxiety, hyperlipidemia was evaluated by telemedicine today.  Patient is currently undergoing TMS therapy and likely has adverse side effects of headaches, and worsening depression, anxiety and sleep problems.  Will benefit from the following plan.  Plan MDD-unstable Will reduce mirtazapine to 22.5 mg p.o. nightly.  Patient prefers to try this dosage prior to reducing it further to 15 mg at bedtime if needed.  Dosage reduced due to adverse side effects of restless leg symptoms affecting her sleep. Discussed to hold off melatonin or reduce the melatonin dosage to 2 to 3 mg only at bedtime as needed.  Patient currently on melatonin 6 mg. Patient to continue CBT. Patient is currently in [facility] therapy.  Patient does have upcoming appointment with [facility] after this visit.  GAD-unstable Continue CBT Will reduce mirtazapine to 22.5 mg p.o. nightly Does have hydroxyzine 10 mg p.o. 3 times daily as needed available however she has not been using it. Will consider making medication readjustments as needed.  Coordinated care with Dr. Estill Bakes care provider-07/24/2022 as noted above.  I have also coordinated care with [facility]-Mr. Debarah Crape -discussed patient's headaches, worsening mood symptoms.  We will consider alternate treatment plan if she does not tolerate TMS.  Patient with significant side effects to medications in the past.  We will consider referral for ECT, ketamine therapy or inpatient behavioral health admission.  I have also obtained collateral information from spouse-Angelica Robertson as noted above.  Follow-up in clinic in 4- 6 weeks or sooner if needed.   Collaboration of Care: Collaboration of Care: Referral or follow-up with counselor/therapist AEB encouraged to follow up with therapist and Other encouraged to follow up with [facility] as well as primary care provider.  Patient to continue Tylenol as needed for headaches and if she has any worsening headaches patient  encouraged to follow up with primary care  provider.  Patient/Guardian was advised Release of Information must be obtained prior to any record release in order to collaborate their care with an outside provider. Patient/Guardian was advised if they have not already done so to contact the registration department to sign all necessary forms in order for [place] to release information regarding their care.   Consent: Patient/Guardian gives verbal consent for treatment and assignment of benefits for services provided during this visit. Patient/Guardian expressed understanding and agreed to proceed.   This note was generated in part or whole with voice recognition software. Voice recognition is usually quite accurate but there are transcription errors that can and very often do occur. I apologize for any typographical errors that were not detected and corrected.      Ursula Alert, MD 07/25/2022, 9:23 PM

## 2022-07-25 NOTE — Telephone Encounter (Signed)
D:  Called to check in on pt re: headaches.  Pt still c/o of headaches.  States the one today started around 11:30 a.m.  On a scale of 1-10 (10 being the worst pain); pt rates it between a 4-5.  Reports taking some Tylenol.  "The Tylenol helped for a little while, but it started back."  Husband was on speakerphone and both stated that pt wanted to take a break and will call the interim coordinator on 07-27-22 to discuss continuation or taking a longer break.  A:  Provided pt with support.  Inform TMS Team. R:  Pt receptive.

## 2022-07-25 NOTE — Progress Notes (Signed)
Patient reported to [facility] for her 7th session of Repetitive Transcranial Magnetic Stimulation treatment for severe episode of recurrent major depressive disorder, without psychotic features. Patient presented with sad affect, level mood and denied any suicidal or homicidal ideations.Patient reported no change in alcohol/substance use, caffeine consumption, sleep pattern or metal implant status since previous tx. She had an appt with Dr. Elna Breslow, reporting ongoing H/A without relief from NSAIDS. Dr. Renaldo Fiddler made aware. Will continue treatment at lower levels until H/A is improived. Pt sat quietly with her eyes closed. Pt reports a headache, rated 4/10. Recommended alternating Motrin and Tylenol. Power was titrated to 65% for the duration of tx. Patient reported no complaints

## 2022-07-26 ENCOUNTER — Encounter (HOSPITAL_COMMUNITY): Payer: Medicare PPO

## 2022-07-26 DIAGNOSIS — L565 Disseminated superficial actinic porokeratosis (DSAP): Secondary | ICD-10-CM | POA: Diagnosis not present

## 2022-07-27 ENCOUNTER — Telehealth (HOSPITAL_COMMUNITY): Payer: Self-pay | Admitting: Psychiatry

## 2022-07-27 ENCOUNTER — Encounter (HOSPITAL_COMMUNITY): Payer: Medicare PPO

## 2022-07-28 ENCOUNTER — Telehealth (HOSPITAL_COMMUNITY): Payer: Self-pay | Admitting: Psychiatry

## 2022-07-28 ENCOUNTER — Encounter (HOSPITAL_COMMUNITY): Payer: Medicare PPO

## 2022-07-28 NOTE — Telephone Encounter (Signed)
D:  According to the TMS team, it has been decided that the treatments will be paused d/t ongoing headaches that pt is having.  Dr. Renaldo Fiddler is requesting that treatments are paused until pt can see her PCP at least.  A:  Placed call to discuss with pt.  She put her husband on speakerphone.  She asked should she see her PCP or psychiatrist.  Inquired when was pt's next appt to see Dr. Elna Robertson; according to pt, it's the end of September.  Informed pt that the interim coordinator will inform Dr. Elna Robertson of the pause and she will decide if pt needs to see her or PCP or her.  Provided pt with support.  Informed pt once her headaches are managed better, thus making her able to tolerate the treatments, to call the coordinator back re: restarting TMS.  R:  Pt receptive.

## 2022-07-30 ENCOUNTER — Encounter: Payer: Self-pay | Admitting: Internal Medicine

## 2022-07-30 DIAGNOSIS — R519 Headache, unspecified: Secondary | ICD-10-CM

## 2022-07-31 ENCOUNTER — Encounter (HOSPITAL_COMMUNITY): Payer: Medicare PPO

## 2022-07-31 ENCOUNTER — Ambulatory Visit (INDEPENDENT_AMBULATORY_CARE_PROVIDER_SITE_OTHER): Payer: Medicare PPO

## 2022-07-31 ENCOUNTER — Encounter: Payer: Self-pay | Admitting: Internal Medicine

## 2022-07-31 DIAGNOSIS — E538 Deficiency of other specified B group vitamins: Secondary | ICD-10-CM | POA: Diagnosis not present

## 2022-07-31 MED ORDER — CYANOCOBALAMIN 1000 MCG/ML IJ SOLN
1000.0000 ug | Freq: Once | INTRAMUSCULAR | Status: AC
  Administered 2022-07-31: 1000 ug via INTRAMUSCULAR

## 2022-07-31 NOTE — Assessment & Plan Note (Signed)
Being followed closely by psychiatry.  On remeron now.   Stopped trazodone and off wellbutrin.  No suicidal ideations. Has had 5 TMS treatments.  Reports increased headache.  Discussed treatments.  Discussed the need to discuss with psychiatry regarding continued treatment/recommendations.  No SI.  She is in agreement.  Discussed will contact psychiatry and discuss prior to her appt.  Agreeable.    Addendum:  Discussed with Dr Elna Breslow regarding above.  Angelica Robertson has appt with her tomorrow.

## 2022-07-31 NOTE — Assessment & Plan Note (Signed)
Headache with treatments.  Discussed with psychiatry.  Plans f/u today and tomorrow as outlined to determine further treatment.

## 2022-07-31 NOTE — Assessment & Plan Note (Signed)
Repeat cbc today 

## 2022-07-31 NOTE — Assessment & Plan Note (Signed)
On crestor.  Follow lipid panel and liver function tests.   

## 2022-07-31 NOTE — Telephone Encounter (Signed)
See note. Please call and confirm pt doing ok.  I do not have a problem with placing an order for neurology - does she have a preference with which neurologist she prefers to see.

## 2022-07-31 NOTE — Assessment & Plan Note (Signed)
Continue losartan and amlodipine.  Follow pressures.  Follow metabolic panel.  

## 2022-07-31 NOTE — Assessment & Plan Note (Signed)
Follow met b and a1c.  

## 2022-07-31 NOTE — Progress Notes (Signed)
Patient presented for B 12 injection to left deltoid, patient voiced no concerns nor showed any signs of distress during injection. 

## 2022-08-01 ENCOUNTER — Ambulatory Visit: Payer: Medicare PPO | Admitting: Psychiatry

## 2022-08-01 NOTE — Telephone Encounter (Signed)
Notify - I have placed the order for neurology referral.  Please notify her that someone should be contacting her with an appt date and time.  Also, let us know if need anything.

## 2022-08-01 NOTE — Telephone Encounter (Signed)
Order placed for neurology referral.   

## 2022-08-01 NOTE — Telephone Encounter (Signed)
Spoke to CMA - nothing needed at this point.  Will talk with husband and let us know if anything is needed.

## 2022-08-09 NOTE — Telephone Encounter (Signed)
Please call her and confirm she is doing ok.  Recommend small bites, chew food well and eat slowly. If she feels needs to be seen, I can see about work in appt.  Confirm no regurgitation of food or pain.  If any acute symptoms, needs to be seen ore urgently.

## 2022-08-10 DIAGNOSIS — Z8659 Personal history of other mental and behavioral disorders: Secondary | ICD-10-CM | POA: Diagnosis not present

## 2022-08-10 DIAGNOSIS — E538 Deficiency of other specified B group vitamins: Secondary | ICD-10-CM | POA: Diagnosis not present

## 2022-08-10 DIAGNOSIS — R55 Syncope and collapse: Secondary | ICD-10-CM | POA: Diagnosis not present

## 2022-08-10 DIAGNOSIS — R519 Headache, unspecified: Secondary | ICD-10-CM | POA: Diagnosis not present

## 2022-08-15 NOTE — Telephone Encounter (Signed)
I have sent a my chart message to her to confirm ok.  Will work her in - please hold for work in appt.  Also, if she does not respond back, please call and confirm doing ok.

## 2022-08-16 NOTE — Telephone Encounter (Signed)
See if she can come in 4:30 on Friday 08/18/22

## 2022-08-16 NOTE — Telephone Encounter (Signed)
Patient states she can come in on Friday 08/18/22 at 4:30

## 2022-08-17 ENCOUNTER — Telehealth: Payer: Self-pay

## 2022-08-17 NOTE — Telephone Encounter (Signed)
Please schedule an appointment for medication management. Please keep on waitlist if no slot available soon. Please request to go to nearest urgent care or ED if needing immediate help.

## 2022-08-17 NOTE — Telephone Encounter (Signed)
pt states she wanted to add something, she is tired, she doesn't want to go out or do anything, she not sleeping, she not eatting well.

## 2022-08-18 ENCOUNTER — Encounter: Payer: Self-pay | Admitting: Internal Medicine

## 2022-08-18 ENCOUNTER — Ambulatory Visit: Payer: Medicare PPO | Admitting: Internal Medicine

## 2022-08-18 VITALS — BP 130/82 | HR 94 | Temp 98.5°F | Ht 63.0 in | Wt 128.8 lb

## 2022-08-18 DIAGNOSIS — F332 Major depressive disorder, recurrent severe without psychotic features: Secondary | ICD-10-CM | POA: Diagnosis not present

## 2022-08-18 DIAGNOSIS — Z23 Encounter for immunization: Secondary | ICD-10-CM | POA: Diagnosis not present

## 2022-08-18 DIAGNOSIS — K219 Gastro-esophageal reflux disease without esophagitis: Secondary | ICD-10-CM | POA: Diagnosis not present

## 2022-08-18 DIAGNOSIS — R519 Headache, unspecified: Secondary | ICD-10-CM | POA: Diagnosis not present

## 2022-08-18 DIAGNOSIS — E7800 Pure hypercholesterolemia, unspecified: Secondary | ICD-10-CM | POA: Diagnosis not present

## 2022-08-18 DIAGNOSIS — I10 Essential (primary) hypertension: Secondary | ICD-10-CM

## 2022-08-18 NOTE — Patient Instructions (Signed)
Pepcid (famotidine) 20mg - take one tablet 30 minutes before your evening meal.   

## 2022-08-18 NOTE — Progress Notes (Unsigned)
Patient ID: Angelica Robertson, female   DOB: 1946-08-22, 76 y.o.   MRN: [id number]   Subjective:    Patient ID: Angelica Robertson, female    DOB: [birth year], 76 y.o.   MRN: [id number]  This visit occurred during the SARS-CoV-2 public health emergency.  Safety protocols were in place, including screening questions prior to the visit, additional usage of staff PPE, and extensive cleaning of exam room while observing appropriate contact time as indicated for disinfecting solutions.   Patient here for  Chief Complaint  Patient presents with   swallowing issues   .   HPI    Past Medical History:  Diagnosis Date   Depression 2000   Heart murmur    Hypercholesterolemia Osteoporosis   Hyperlipemia    Past Surgical History:  Procedure Laterality Date   ABDOMINAL HYSTERECTOMY  1980   COLONOSCOPY WITH PROPOFOL N/A 03/05/2018   Procedure: COLONOSCOPY WITH PROPOFOL;  Surgeon: Toledo, [name], MD;  Location: [facility] ENDOSCOPY;  Service: Endoscopy;  Laterality: N/A;   Family History  Problem Relation Age of Onset   Heart disease Mother    Diabetes Mother    Heart disease Father    Diabetes Father    Colon cancer Other    Congestive Heart Failure Brother    Colon cancer Brother    Mental illness Paternal Aunt    BRCA 1/2 Neg Hx    Breast cancer Neg Hx    Cowden syndrome Neg Hx    DES usage Neg Hx    Endometrial cancer Neg Hx    Li-Fraumeni syndrome Neg Hx    Ovarian cancer Neg Hx    Social History   Socioeconomic History   Marital status: Married    Spouse name: robert   Number of children: 2   Years of education: Not on file   Highest education level: Not on file  Occupational History   Not on file  Tobacco Use   Smoking status: Never   Smokeless tobacco: Never  Vaping Use   Vaping Use: Never used  Substance and Sexual Activity   Alcohol use: Yes    Alcohol/week: 0.0 standard drinks of alcohol    Comment: occasionally - socially during the summer a glass of wine or beer   Drug  use: No   Sexual activity: Yes  Other Topics Concern   Not on file  Social History Narrative   Not on file   Social Determinants of Health   Financial Resource Strain: Low Risk  (02/27/2018)   Overall Financial Resource Strain (CARDIA)    Difficulty of Paying Living Expenses: Not hard at all  Food Insecurity: No Food Insecurity (02/27/2018)   Hunger Vital Sign    Worried About Running Out of Food in the Last Year: Never true    [place] in the Last Year: Never true  Transportation Needs: No Transportation Needs (02/27/2018)   PRAPARE - Hydrologist (Medical): No    Lack of Transportation (Non-Medical): No  Physical Activity: Sufficiently Active (03/11/2019)   Exercise Vital Sign    Days of Exercise per Week: 5 days    Minutes of Exercise per Session: 30 min  Stress: Stress Concern Present (11/13/2019)   [place]    Feeling of Stress : To some extent  Social Connections: Unknown (11/13/2019)   Social Connection and Isolation Panel [NHANES]    Frequency of Communication with Friends and Family:  Not on file    Frequency of Social Gatherings with Friends and Family: Not on file    Attends Religious Services: Not on file    Active Member of Clubs or Organizations: Not on file    Attends Club or Organization Meetings: Not on file    Marital Status: Married     Review of Systems     Objective:     BP 130/82 (BP Location: Left Arm, Patient Position: Sitting, Cuff Size: Normal)   Pulse 94   Temp 98.5 F (36.9 C) (Oral)   Ht 5' 3" (1.6 m)   Wt 128 lb 12.8 oz (58.4 kg)   LMP 11/27/1979   SpO2 97%   BMI 22.82 kg/m  Wt Readings from Last 3 Encounters:  08/18/22 128 lb 12.8 oz (58.4 kg)  07/24/22 125 lb 6.4 oz (56.9 kg)  07/13/22 128 lb 3.2 oz (58.2 kg)    Physical Exam   Outpatient Encounter Medications as of 08/18/2022  Medication Sig   amLODipine (NORVASC) 2.5 MG tablet  Take 1 tablet (2.5 mg total) by mouth daily. In am   calcium carbonate (OS-CAL) 600 MG TABS tablet Take 600 mg by mouth 2 (two) times daily with a meal.   losartan (COZAAR) 25 MG tablet Take 1 tablet (25 mg total) by mouth daily.   Melatonin 3 MG CAPS Take 6 mg by mouth.   mirtazapine (REMERON) 15 MG tablet Take 1.5 tablets (22.5 mg total) by mouth at bedtime. PATIENT HAS SUPPLIES   rosuvastatin (CRESTOR) 20 MG tablet Take 1 tablet (20 mg total) by mouth at bedtime.   VITAMIN D PO Take by mouth daily.   No facility-administered encounter medications on file as of 08/18/2022.     Lab Results  Component Value Date   WBC 13.0 (H) 07/24/2022   HGB 13.5 07/24/2022   HCT 40.7 07/24/2022   PLT 365.0 07/24/2022   GLUCOSE 134 (H) 07/19/2022   CHOL 135 07/19/2022   TRIG 75.0 07/19/2022   HDL 69.90 07/19/2022   LDLCALC 50 07/19/2022   ALT 21 07/19/2022   AST 21 07/19/2022   NA 139 07/19/2022   K 4.1 07/19/2022   CL 100 07/19/2022   CREATININE 0.78 07/19/2022   BUN 15 07/19/2022   CO2 32 07/19/2022   TSH 4.46 03/21/2022   HGBA1C 6.6 (H) 07/19/2022    MM 3D SCREEN BREAST BILATERAL  Result Date: 06/19/2022 CLINICAL DATA:  Screening. EXAM: DIGITAL SCREENING BILATERAL MAMMOGRAM WITH TOMOSYNTHESIS AND CAD TECHNIQUE: Bilateral screening digital craniocaudal and mediolateral oblique mammograms were obtained. Bilateral screening digital breast tomosynthesis was performed. The images were evaluated with computer-aided detection. COMPARISON:  Previous exam(s). ACR Breast Density Category b: There are scattered areas of fibroglandular density. FINDINGS: There are no findings suspicious for malignancy. IMPRESSION: No mammographic evidence of malignancy. A result letter of this screening mammogram will be mailed directly to the patient. RECOMMENDATION: Screening mammogram in one year. (Code:SM-B-01Y) BI-RADS CATEGORY  1: Negative. Electronically Signed   By: Steven  Reid M.D.   On: 06/19/2022 18:14        Assessment & Plan:   Problem List Items Addressed This Visit   None     , MD  

## 2022-08-20 ENCOUNTER — Encounter: Payer: Self-pay | Admitting: Internal Medicine

## 2022-08-20 DIAGNOSIS — K219 Gastro-esophageal reflux disease without esophagitis: Secondary | ICD-10-CM | POA: Insufficient documentation

## 2022-08-20 NOTE — Assessment & Plan Note (Signed)
On crestor.  Follow lipid panel and liver function tests.   

## 2022-08-20 NOTE — Assessment & Plan Note (Signed)
Discussed her swallowing symptoms.  She is able to eat.  No dysphagia or odynophagia.  Does feel like there is something in her throat.  Has to clear her throat often.  Some acid reflux.  Takes TUMS prn.  Discussed trial of pepcid.  Agreeable.  Take 30 minutes before evening meal.  If persistent symptoms, discussed swallowing evaluation.

## 2022-08-20 NOTE — Assessment & Plan Note (Signed)
Continue losartan and amlodipine. Pressures as outlined.  No change in medication.  Follow pressures.  Follow metabolic panel.  

## 2022-08-20 NOTE — Assessment & Plan Note (Signed)
Saw neurology.  Recommended magnesium.  Agree with magnesium.  Follow

## 2022-08-20 NOTE — Assessment & Plan Note (Signed)
Being followed closely by psychiatry.  On remeron now.  Has hydroxyzine to take prn.  No suicidal ideations.  Continue f/u with psychiatry.  TMS treatments stopped due to persistent headaches.  Desires to discuss with psychiatry regarding starting a new medication.  Follow.

## 2022-08-22 ENCOUNTER — Encounter: Payer: Self-pay | Admitting: Psychiatry

## 2022-08-22 ENCOUNTER — Ambulatory Visit (INDEPENDENT_AMBULATORY_CARE_PROVIDER_SITE_OTHER): Payer: Medicare PPO | Admitting: Psychiatry

## 2022-08-22 VITALS — BP 148/77 | HR 109 | Ht 63.0 in | Wt 128.0 lb

## 2022-08-22 DIAGNOSIS — Z79899 Other long term (current) drug therapy: Secondary | ICD-10-CM | POA: Diagnosis not present

## 2022-08-22 DIAGNOSIS — F332 Major depressive disorder, recurrent severe without psychotic features: Secondary | ICD-10-CM | POA: Diagnosis not present

## 2022-08-22 DIAGNOSIS — F4323 Adjustment disorder with mixed anxiety and depressed mood: Secondary | ICD-10-CM | POA: Diagnosis not present

## 2022-08-22 DIAGNOSIS — F411 Generalized anxiety disorder: Secondary | ICD-10-CM

## 2022-08-22 MED ORDER — ALPRAZOLAM 0.25 MG PO TABS: 0.2500 mg | ORAL_TABLET | ORAL | 0 refills | Status: DC

## 2022-08-22 MED ORDER — ESCITALOPRAM OXALATE 5 MG PO TABS: 5.0000 mg | ORAL_TABLET | Freq: Every day | ORAL | 1 refills | Status: DC

## 2022-08-22 MED ORDER — MIRTAZAPINE 15 MG PO TABS: 22.5000 mg | ORAL_TABLET | Freq: Every day | ORAL | 1 refills | Status: DC

## 2022-08-22 NOTE — Patient Instructions (Signed)
Alprazolam Tablets What is this medication? ALPRAZOLAM (al PRAY zoe lam) treats anxiety. It works by helping your nervous system calm down. It belongs to a group of medications called benzodiazepines. This medicine may be used for other purposes; ask your health care provider or pharmacist if you have questions. COMMON BRAND NAME(S): Xanax What should I tell my care team before I take this medication? They need to know if you have any of these conditions: Kidney disease Liver disease Lung disease, such as asthma or breathing problems Mental health condition Seizures Substance use disorder Suicidal thoughts, plans, or attempt by you or a family member An unusual or allergic reaction to alprazolam, other medications, foods, dyes, or preservatives Pregnant or trying to get pregnant Breastfeeding How should I use this medication? Take this medication by mouth. Take it as directed on the prescription label. Do not take it more often than directed. Keep taking it unless your care team tells you to stop. A special MedGuide will be given to you by the pharmacist with each prescription and refill. Be sure to read this information carefully each time. Talk to your care team about the use of this medication in children. Special care may be needed. People 65 years and older may have a stronger reaction and need a smaller dose. Overdosage: If you think you have taken too much of this medicine contact a poison control center or emergency room at once. NOTE: This medicine is only for you. Do not share this medicine with others. What if I miss a dose? If you miss a dose, take it as soon as you can. If it is almost time for your next dose, take only that dose. Do not take double or extra doses. What may interact with this medication? Do not take this medication with any of the following: Adagrasib Certain antivirals for HIV or hepatitis Certain medications for fungal infections, such as ketoconazole,  itraconazole, or posaconazole Clarithromycin Grapefruit juice Opioid medications for cough Sodium oxybate This medication may also interact with the following: Alcohol Antihistamines for allergy, cough and cold Certain medications for anxiety or sleep Certain medications for depression, such as amitriptyline, fluoxetine, fluvoxamine, nefazodone, sertraline Certain medications for seizures, such as carbamazepine, phenobarbital, phenytoin, primidone Cimetidine Digoxin Erythromycin Estrogen or progestin hormones General anesthetics, such as halothane, isoflurane, methoxyflurane, propofol Medications that relax muscles Opioid medications for pain Phenothiazines, such as chlorpromazine, mesoridazine, prochlorperazine, thioridazine This list may not describe all possible interactions. Give your health care provider a list of all the medicines, herbs, non-prescription drugs, or dietary supplements you use. Also tell them if you smoke, drink alcohol, or use illegal drugs. Some items may interact with your medicine. What should I watch for while using this medication? Visit your care team for regular checks on your progress. Tell your care team if your symptoms do not start to get better or if they get worse. Do not stop taking except on your care team's advice. You may develop a severe reaction. Your care team will tell you how much medication to take. Long term use of this medication may cause your brain and body to depend on it. This can happen even when used as directed by your care team. You and your care team will work together to determine how long you will need to take this medication. This medication may affect your coordination, reaction time, or judgment. Do not drive or operate machinery until you know how this medication affects you. Sit up or stand slowly to   reduce the risk of dizzy or fainting spells. Drinking alcohol with this medication can increase the risk of these side effects. If  you are taking another medication that also causes drowsiness, you may have more side effects. Give your care team a list of all medications you use. Your care team will tell you how much medication to take. Do not take more medication than directed. Get emergency help right away if you have problems breathing or unusual sleepiness. If you or your family notice any changes in your behavior, such as new or worsening depression, thoughts of harming yourself, anxiety, other unusual or disturbing thoughts, or memory loss, call your care team right away. Talk to your care team if you wish to become pregnant or think you might be pregnant. This medication can cause serious birth defects. Talk to your care team before breastfeeding. Changes to your treatment plan may be needed. What side effects may I notice from receiving this medication? Side effects that you should report to your care team as soon as possible: Allergic reactions--skin rash, itching, hives, swelling of the face, lips, tongue, or throat CNS depression--slow or shallow breathing, shortness of breath, feeling faint, dizziness, confusion, trouble staying awake Thoughts of suicide or self-harm, worsening mood, feelings of depression Side effects that usually do not require medical attention (report to your care team if they continue or are bothersome): Change in sex drive or performance Dizziness Drowsiness Nausea This list may not describe all possible side effects. Call your doctor for medical advice about side effects. You may report side effects to FDA at 1-800-FDA-1088. Where should I keep my medication? Keep out of the reach of children and pets. This medication can be abused. Keep it in a safe place to protect it from theft. Do not share it with anyone. It is only for you. Selling or giving away this medication is dangerous and against the law. Store at room temperature between 20 and 25 degrees C (68 and 77 degrees F). Get rid of any  unused medication after the expiration date. This medication may cause harm and death if it is taken by other adults, children, or pets. It is important to get rid of the medication as soon as you no longer need it, or it is expired. You can do this in two ways: Take the medication to a medication take-back program. Check with your pharmacy or law enforcement to find a location. If you cannot return the medication, check the label or package insert to see if the medication should be thrown out in the garbage or flushed down the toilet. If you are not sure, ask your care team. If it is safe to put it in the trash, take the medication out of the container. Mix the medication with cat litter, dirt, coffee grounds, or other unwanted substance. Seal the mixture in a bag or container. Put it in the trash. NOTE: This sheet is a summary. It may not cover all possible information. If you have questions about this medicine, talk to your doctor, pharmacist, or health care provider.  2023 Elsevier/Gold Standard (2004-12-27 00:00:00) Escitalopram Tablets What is this medication? ESCITALOPRAM (es sye TAL oh pram) treats depression and anxiety. It increases the amount of serotonin in the brain, a hormone that helps regulate mood. It belongs to a group of medications called SSRIs. This medicine may be used for other purposes; ask your health care provider or pharmacist if you have questions. COMMON BRAND NAME(S): Lexapro What should I tell  my care team before I take this medication? They need to know if you have any of these conditions: Bipolar disorder or a family history of bipolar disorder Diabetes Glaucoma Heart disease Kidney or liver disease Receiving electroconvulsive therapy Seizures Suicidal thoughts, plans, or attempt by you or a family member An unusual or allergic reaction to escitalopram, the related medication citalopram, other medications, foods, dyes, or preservatives Pregnant or trying to  become pregnant Breast-feeding How should I use this medication? Take this medication by mouth with a glass of water. Follow the directions on the prescription label. You can take it with or without food. If it upsets your stomach, take it with food. Take your medication at regular intervals. Do not take it more often than directed. Do not stop taking this medication suddenly except upon the advice of your care team. Stopping this medication too quickly may cause serious side effects or your condition may worsen. A special MedGuide will be given to you by the pharmacist with each prescription and refill. Be sure to read this information carefully each time. Talk to your care team regarding the use of this medication in children. Special care may be needed. Overdosage: If you think you have taken too much of this medicine contact a poison control center or emergency room at once. NOTE: This medicine is only for you. Do not share this medicine with others. What if I miss a dose? If you miss a dose, take it as soon as you can. If it is almost time for your next dose, take only that dose. Do not take double or extra doses. What may interact with this medication? Do not take this medication with any of the following: Certain medications for fungal infections like fluconazole, itraconazole, ketoconazole, posaconazole, voriconazole Cisapride Citalopram Dronedarone Linezolid MAOIs like Carbex, Eldepryl, Marplan, Nardil, and Parnate Methylene blue (injected into a vein) Pimozide Thioridazine This medication may also interact with the following: Alcohol Amphetamines Aspirin and aspirin-like medications Carbamazepine Certain medications for depression, anxiety, or psychotic disturbances Certain medications for migraine headache like almotriptan, eletriptan, frovatriptan, naratriptan, rizatriptan, sumatriptan, zolmitriptan Certain medications for sleep Certain medications that treat or prevent blood  clots like warfarin, enoxaparin, dalteparin Cimetidine Diuretics Dofetilide Fentanyl Furazolidone Isoniazid Lithium Metoprolol NSAIDs, medications for pain and inflammation, like ibuprofen or naproxen Other medications that prolong the QT interval (cause an abnormal heart rhythm) Procarbazine Rasagiline Supplements like St. John's wort, kava kava, valerian Tramadol Tryptophan Ziprasidone This list may not describe all possible interactions. Give your health care provider a list of all the medicines, herbs, non-prescription drugs, or dietary supplements you use. Also tell them if you smoke, drink alcohol, or use illegal drugs. Some items may interact with your medicine. What should I watch for while using this medication? Tell your care team if your symptoms do not get better or if they get worse. Visit your care team for regular checks on your progress. Because it may take several weeks to see the full effects of this medication, it is important to continue your treatment as prescribed by your care team. Watch for new or worsening thoughts of suicide or depression. This includes sudden changes in mood, behaviors, or thoughts. These changes can happen at any time but are more common in the beginning of treatment or after a change in dose. Call your care team right away if you experience these thoughts or worsening depression. Manic episodes may happen in patients with bipolar disorder who take this medication. Watch for changes in  feelings or behaviors such as feeling anxious, nervous, agitated, panicky, irritable, hostile, aggressive, impulsive, severely restless, overly excited and hyperactive, or trouble sleeping. These symptoms can happen at any time but are more common in the beginning of treatment or after a change in dose. Call your care team right away if you notice any of these symptoms. You may get drowsy or dizzy. Do not drive, use machinery, or do anything that needs mental alertness  until you know how this medication affects you. Do not stand or sit up quickly, especially if you are an older patient. This reduces the risk of dizzy or fainting spells. Alcohol may interfere with the effect of this medication. Avoid alcoholic drinks. Your mouth may get dry. Chewing sugarless gum or sucking hard candy, and drinking plenty of water may help. Contact your care team if the problem does not go away or is severe. What side effects may I notice from receiving this medication? Side effects that you should report to your care team as soon as possible: Allergic reactions--skin rash, itching, hives, swelling of the face, lips, tongue, or throat Bleeding--bloody or black, tar-like stools, red or dark brown urine, vomiting blood or brown material that looks like coffee grounds, small, red or purple spots on skin, unusual bleeding or bruising Heart rhythm changes--fast or irregular heartbeat, dizziness, feeling faint or lightheaded, chest pain, trouble breathing Low sodium level--muscle weakness, fatigue, dizziness, headache, confusion Serotonin syndrome--irritability, confusion, fast or irregular heartbeat, muscle stiffness, twitching muscles, sweating, high fever, seizure, chills, vomiting, diarrhea Sudden eye pain or change in vision such as blurry vision, seeing halos around lights, vision loss Thoughts of suicide or self-harm, worsening mood, feelings of depression Side effects that usually do not require medical attention (report to your care team if they continue or are bothersome): Change in sex drive or performance Diarrhea Excessive sweating Nausea Tremors or shaking Upset stomach This list may not describe all possible side effects. Call your doctor for medical advice about side effects. You may report side effects to FDA at 1-800-FDA-1088. Where should I keep my medication? Keep out of reach of children and pets. Store at room temperature between 15 and 30 degrees C (59 and 86  degrees F). Throw away any unused medication after the expiration date. NOTE: This sheet is a summary. It may not cover all possible information. If you have questions about this medicine, talk to your doctor, pharmacist, or health care provider.  2023 Elsevier/Gold Standard (2020-11-15 00:00:00)

## 2022-08-22 NOTE — Telephone Encounter (Signed)
It appears her appt has already been canceled.  Please schedule a f/u appt in 4 weeks.

## 2022-08-22 NOTE — Progress Notes (Unsigned)
[facility] MD OP Progress Note  08/22/2022 12:45 PM MADELIENE Robertson  MRN:  [id number]  Chief Complaint:  Chief Complaint  Patient presents with   Follow-up: 76 year old Caucasian female with history of depression, anxiety, presented for medication management with worsening mood symptoms.   HPI: Angelica Robertson is a 76 year old Caucasian female, married, retired, lives in [place], has a history of MDD, GAD, hyperlipidemia, arthritis was evaluated in office today.  Patient as well as spouse participated in the evaluation.  Patient currently with worsening mood symptoms, tearful in session, reports she feels sad, has low energy, reduced appetite and sleep is restless.  Patient reports she also feels extremely anxious especially when she has to get out, like go to church or go to a party.  She reports although she is anxious she pushes herself and she is able to go and participate when she does that.  However the anxiety is too overwhelming for her and she wonders whether she can have medication changes.  Patient reports she also is worried about her lack of weight gain.  Since July 2023 per review of medical records patient has remained 128 pounds.  Patient reports she has been eating 2-3 meals and also supplementing with Ensure.  She however is worried about the fact that she is not gaining weight and is staying the same.  Patient wonders whether she has cancer since she is not gaining any weight.  She does have skin lesions, hyperpigmented over her upper extremities.  Reports she had dermatology visit and was told that it was precancerous.  Patient reports that also worries her.  Patient reports she is also worried about her husband who has been extremely helpful, feels as though he is doing too much for her and does not know how much more he can handle.  Patient is currently compliant on the mirtazapine.  Denies side effects.  Has tried and failed medications like Zoloft, duloxetine, Wellbutrin.  Agreeable to  trial of Lexapro.  Patient is currently in psychotherapy session with [facility]-Ms. Raynelle Jan.  Follows up every other week.  Has had 6 sessions altogether.  Patient currently denies any suicidality, homicidality or perceptual disturbances.  Appeared to be alert oriented to person place time and situation.    Visit Diagnosis:    ICD-10-CM   1. Major depressive disorder, recurrent, severe without psychotic features ([place])  F33.2 escitalopram (LEXAPRO) 5 MG tablet    mirtazapine (REMERON) 15 MG tablet    Sodium    2. Generalized anxiety disorder  F41.1 escitalopram (LEXAPRO) 5 MG tablet    ALPRAZolam (XANAX) 0.25 MG tablet    mirtazapine (REMERON) 15 MG tablet    Sodium    3. High risk medication use  Z79.899 Sodium      Past Psychiatric History: Reviewed past psychiatric history from progress note on 01/12/2020.  Patient attempted TMS-07/17/2022-however could not continue due to side effects of headaches.  Past Medical History:  Past Medical History:  Diagnosis Date   Depression 2000   Heart murmur    Hypercholesterolemia Osteoporosis   Hyperlipemia     Past Surgical History:  Procedure Laterality Date   ABDOMINAL HYSTERECTOMY  1980   COLONOSCOPY WITH PROPOFOL N/A 03/05/2018   Procedure: COLONOSCOPY WITH PROPOFOL;  Surgeon: Toledo, [name], MD;  Location: [facility] ENDOSCOPY;  Service: Endoscopy;  Laterality: N/A;    Family Psychiatric History: Reviewed family psychiatric history from progress note on 01/12/2020.  Family History:  Family History  Problem Relation Age of  Onset   Heart disease Mother    Diabetes Mother    Heart disease Father    Diabetes Father    Colon cancer Other    Congestive Heart Failure Brother    Colon cancer Brother    Mental illness Paternal Aunt    BRCA 1/2 Neg Hx    Breast cancer Neg Hx    Cowden syndrome Neg Hx    DES usage Neg Hx    Endometrial cancer Neg Hx    Li-Fraumeni syndrome Neg Hx    Ovarian cancer Neg Hx     Social  History: Reviewed social history from progress note on 01/12/2020. Social History   Socioeconomic History   Marital status: Married    Spouse name: robert   Number of children: 2   Years of education: Not on file   Highest education level: Not on file  Occupational History   Not on file  Tobacco Use   Smoking status: Never   Smokeless tobacco: Never  Vaping Use   Vaping Use: Never used  Substance and Sexual Activity   Alcohol use: Yes    Alcohol/week: 0.0 standard drinks of alcohol    Comment: occasionally - socially during the summer a glass of wine or beer   Drug use: No   Sexual activity: Yes  Other Topics Concern   Not on file  Social History Narrative   Not on file   Social Determinants of Health   Financial Resource Strain: Low Risk  (02/27/2018)   Overall Financial Resource Strain (CARDIA)    Difficulty of Paying Living Expenses: Not hard at all  Food Insecurity: No Food Insecurity (02/27/2018)   Hunger Vital Sign    Worried About Running Out of Food in the Last Year: Never true    [place] in the Last Year: Never true  Transportation Needs: No Transportation Needs (02/27/2018)   PRAPARE - Hydrologist (Medical): No    Lack of Transportation (Non-Medical): No  Physical Activity: Sufficiently Active (03/11/2019)   Exercise Vital Sign    Days of Exercise per Week: 5 days    Minutes of Exercise per Session: 30 min  Stress: Stress Concern Present (11/13/2019)   [place]    Feeling of Stress : To some extent  Social Connections: Unknown (11/13/2019)   Social Connection and Isolation Panel [NHANES]    Frequency of Communication with Friends and Family: Not on file    Frequency of Social Gatherings with Friends and Family: Not on file    Attends Religious Services: Not on file    Active Member of Clubs or Organizations: Not on file    Attends Archivist  Meetings: Not on file    Marital Status: Married    Allergies: No Known Allergies  Metabolic Disorder Labs: Lab Results  Component Value Date   HGBA1C 6.6 (H) 07/19/2022   No results found for: "PROLACTIN" Lab Results  Component Value Date   CHOL 135 07/19/2022   TRIG 75.0 07/19/2022   HDL 69.90 07/19/2022   CHOLHDL 2 07/19/2022   VLDL 15.0 07/19/2022   LDLCALC 50 07/19/2022   LDLCALC 67 03/21/2022   Lab Results  Component Value Date   TSH 4.46 03/21/2022   TSH 3.32 11/29/2020    Therapeutic Level Labs: No results found for: "LITHIUM" No results found for: "VALPROATE" No results found for: "CBMZ"  Current Medications: Current Outpatient Medications  Medication Sig Dispense Refill   ALPRAZolam (XANAX) 0.25 MG tablet Take 1 tablet (0.25 mg total) by mouth as directed. Take daily as needed for severe anxiety attacks ,please limit use 15 tablet 0   amLODipine (NORVASC) 2.5 MG tablet Take 1 tablet (2.5 mg total) by mouth daily. In am 90 tablet 3   calcium carbonate (OS-CAL) 600 MG TABS tablet Take 600 mg by mouth 2 (two) times daily with a meal.     escitalopram (LEXAPRO) 5 MG tablet Take 1 tablet (5 mg total) by mouth daily with breakfast. 30 tablet 1   losartan (COZAAR) 25 MG tablet Take 1 tablet (25 mg total) by mouth daily. 90 tablet 1   Melatonin 3 MG CAPS Take 6 mg by mouth.     rosuvastatin (CRESTOR) 20 MG tablet Take 1 tablet (20 mg total) by mouth at bedtime. 90 tablet 3   VITAMIN D PO Take by mouth daily.     fluorouracil (EFUDEX) 5 % cream Apply 1 Application topically 2 (two) times daily.     mirtazapine (REMERON) 15 MG tablet Take 1.5 tablets (22.5 mg total) by mouth at bedtime. 45 tablet 1   No current facility-administered medications for this visit.     Musculoskeletal: Strength & Muscle Tone: within normal limits Gait & Station: normal Patient leans: N/A  Psychiatric Specialty Exam: Review of Systems  Skin:  Positive for color change (Upper  extremities - BL - dark spots).  Psychiatric/Behavioral:  Positive for decreased concentration, dysphoric mood and sleep disturbance. The patient is nervous/anxious.   All other systems reviewed and are negative.   Blood pressure (!) 148/77, pulse (!) 109, height 5' 3"  (1.6 m), weight 128 lb (58.1 kg), last menstrual period 11/27/1979.Body mass index is 22.67 kg/m.  General Appearance: Casual  Eye Contact:  Fair  Speech:  Clear and Coherent  Volume:  Normal  Mood:  Anxious and Depressed  Affect:  Congruent  Thought Process:  Goal Directed and Descriptions of Associations: Intact  Orientation:  Full (Time, Place, and Person)  Thought Content: Logical   Suicidal Thoughts:  No  Homicidal Thoughts:  No  Memory:  Immediate;   Fair Recent;   Fair Remote;   Fair  Judgement:  Fair  Insight:  Fair  Psychomotor Activity:  Normal  Concentration:  Concentration: Fair and Attention Span: Fair  Recall:  [employer] of Knowledge: Fair  Language: Fair  Akathisia:  No  Handed:  Right  AIMS (if indicated): not done  Assets:  Communication Skills Desire for Improvement Housing Social Support  ADL's:  Intact  Cognition: WNL  Sleep:   Restless   Screenings: [place] Office Visit from 06/20/2022 in [place] Office Visit from 04/24/2022 in [place] Total Score 0 0      AUDIT    Flowsheet Row Admission (Discharged) from 05/12/2022 in [place] Admission (Discharged) from 12/17/2019 in [place] Admission (Discharged) from OP Visit from 12/09/2012 in [facility]  Alcohol Use Disorder Identification Test Final Score (AUDIT) 0 0 0      GAD-7    Flowsheet Row Office Visit from 08/22/2022 in [place] Office Visit from 08/18/2022 in [place] Office Visit from 07/10/2022 in  [place] Video Visit from 01/18/2022 in [place] Video Visit from 05/17/2021 in [place] [facility]  Total GAD-7 Score  20 7 18  0 0      Mini-Mental    Flowsheet Row Clinical Support from 02/27/2018 in [facility] from 02/02/2017 in [facility] from [facility]/03/2016 in [facility]  Total Score (max 30 points ) 30 30 30       PHQ2-9    [place] Visit from 08/22/2022 in [place] Office Visit from 08/18/2022 in [facility] Video Visit from 07/25/2022 in [facility] from 07/24/2022 in [place] Visit from 07/13/2022 in [facility]  PHQ-2 Total Score 4 4 6 6 6   PHQ-9 Total Score 16 13 17 22 20       [place] Visit from 08/22/2022 in [place] Video Visit from 07/25/2022 in [place] Office Visit from 06/20/2022 in [place] No Risk No Risk No Risk        Assessment and Plan: CORRYN MADEWELL is a 76 year old Caucasian female, married, retired, lives in [place], has a history of MDD, anxiety, hyperlipidemia was evaluated in office today.  Patient failed [place] therapy, developed side effects of headaches, currently struggling with depression, anxiety, will benefit from the following plan.  Plan MDD-unstable Continue mirtazapine 22.5 mg p.o. nightly Start Lexapro 5 mg p.o. daily with breakfast.  Advised to take it in the evening with supper if it makes her drowsy.  Provided medication education, drug to drug interaction, serotonin syndrome. Continue CBT with Ms. Raynelle Jan.  GAD-unstable Continue mirtazapine at reduced dosage of 22.5 mg p.o. nightly Start  Lexapro 5 mg p.o. daily with breakfast Hydroxyzine 10 mg p.o. 3 times daily as needed Start Xanax 0.25 mg as needed for severe anxiety attacks advised to limit use, discussed long-term affect of being on benzodiazepine therapy.  [place] PMP aware.she has used it in the past and did not have side effects.  High risk medication use-will order sodium level.  Patient to go to lab in 2 weeks after starting Lexapro.  Collateral information obtained from spouse who seems to think patient does struggling with anxiety. Discussed [employer] testing with patient as well as spouse.  Patient to follow up with primary care provider if blood pressure stays elevated, patient as well as spouse reports blood pressure as normal when she is at home and likely it is elevated today due to her being in the doctor's office and being anxious.  Follow-up in clinic in 4 weeks or sooner if needed.  This note was generated in part or whole with voice recognition software. Voice recognition is usually quite accurate but there are transcription errors that can and very often do occur. I apologize for any typographical errors that were not detected and corrected.    Ursula Alert, MD 08/23/2022, 10:59 AM

## 2022-08-24 ENCOUNTER — Ambulatory Visit: Payer: Medicare PPO | Admitting: Internal Medicine

## 2022-08-24 NOTE — Telephone Encounter (Signed)
Please schedule a four week follow up.

## 2022-08-31 ENCOUNTER — Ambulatory Visit (INDEPENDENT_AMBULATORY_CARE_PROVIDER_SITE_OTHER): Payer: Medicare PPO

## 2022-08-31 ENCOUNTER — Ambulatory Visit: Payer: Medicare PPO

## 2022-08-31 DIAGNOSIS — E538 Deficiency of other specified B group vitamins: Secondary | ICD-10-CM

## 2022-08-31 MED ORDER — CYANOCOBALAMIN 1000 MCG/ML IJ SOLN
1000.0000 ug | Freq: Once | INTRAMUSCULAR | Status: AC
  Administered 2022-08-31: 1000 ug via INTRAMUSCULAR

## 2022-08-31 NOTE — Progress Notes (Signed)
Pt arrived for B12 injection, given in R deltoid. Pt tolerated injection well, showed no signs of distress nor voiced any concerns.  

## 2022-09-04 DIAGNOSIS — F4323 Adjustment disorder with mixed anxiety and depressed mood: Secondary | ICD-10-CM | POA: Diagnosis not present

## 2022-09-04 DIAGNOSIS — F332 Major depressive disorder, recurrent severe without psychotic features: Secondary | ICD-10-CM | POA: Diagnosis not present

## 2022-09-05 DIAGNOSIS — F411 Generalized anxiety disorder: Secondary | ICD-10-CM | POA: Diagnosis not present

## 2022-09-05 DIAGNOSIS — L565 Disseminated superficial actinic porokeratosis (DSAP): Secondary | ICD-10-CM | POA: Diagnosis not present

## 2022-09-05 DIAGNOSIS — Z79899 Other long term (current) drug therapy: Secondary | ICD-10-CM | POA: Diagnosis not present

## 2022-09-05 DIAGNOSIS — F332 Major depressive disorder, recurrent severe without psychotic features: Secondary | ICD-10-CM | POA: Diagnosis not present

## 2022-09-06 ENCOUNTER — Ambulatory Visit: Payer: Medicare PPO | Admitting: Psychiatry

## 2022-09-06 LAB — SODIUM: Sodium: 137 mmol/L (ref 134–144)

## 2022-09-18 DIAGNOSIS — F4323 Adjustment disorder with mixed anxiety and depressed mood: Secondary | ICD-10-CM | POA: Diagnosis not present

## 2022-09-18 DIAGNOSIS — F332 Major depressive disorder, recurrent severe without psychotic features: Secondary | ICD-10-CM | POA: Diagnosis not present

## 2022-09-20 ENCOUNTER — Encounter: Payer: Self-pay | Admitting: Psychiatry

## 2022-09-20 ENCOUNTER — Ambulatory Visit (INDEPENDENT_AMBULATORY_CARE_PROVIDER_SITE_OTHER): Payer: Medicare PPO | Admitting: Psychiatry

## 2022-09-20 VITALS — BP 127/66 | HR 87 | Temp 98.2°F | Ht 63.0 in | Wt 129.0 lb

## 2022-09-20 DIAGNOSIS — F411 Generalized anxiety disorder: Secondary | ICD-10-CM | POA: Diagnosis not present

## 2022-09-20 DIAGNOSIS — Z79899 Other long term (current) drug therapy: Secondary | ICD-10-CM | POA: Diagnosis not present

## 2022-09-20 DIAGNOSIS — F3341 Major depressive disorder, recurrent, in partial remission: Secondary | ICD-10-CM | POA: Diagnosis not present

## 2022-09-20 MED ORDER — MIRTAZAPINE 15 MG PO TABS: 22.5000 mg | ORAL_TABLET | Freq: Every day | ORAL | 1 refills | Status: DC

## 2022-09-20 NOTE — Progress Notes (Signed)
[facility] MD OP Progress Note  09/20/2022 3:03 PM Angelica Robertson  MRN:  [id number]  Chief Complaint:  Chief Complaint  Patient presents with   Follow-up   Anxiety   Depression   HPI: Angelica Robertson is a 76 year old Caucasian female, married, retired, lives in [place], has a history of MDD, GAD, hyperlipidemia, arthritis was evaluated in office today.  Patient today reports she is currently tolerating the Lexapro better than her other medications.  She however continues to struggle with nausea few times a week.  Some days it lasts for 2 to 3 hours after taking it.  She reports she does have a history of GERD and was prescribed Prilosec in the past by her primary care doctor.  She took it for a couple of days and stop taking it.  Patient reports she has upcoming appointment with primary care this Friday and agrees to discuss this with her.  Once she is able to manage her nausea better patient advised to reach out for possible increase of Lexapro 5 mg to a 10 mg.  Currently the 5 mg has been helpful with her depression symptoms.  She reports she has been getting out more for social events like church, as well as going for walks.  She has not started going to the gym since the gym is undergoing renovation.  Hopefully it will open up mid November.  Looks forward to that.  She also has started cooking and doing house chores more than before.  Patient reports anxiety has improved a lot compared to how it was previously.  Lexapro seems to be beneficial for the same.  She was prescribed Xanax and she only used it a couple of times since her last visit.  Patient reports she continues to struggle with her appetite.  Although she is eating 2-3 meals per day she is only eating small portions.  She also has been supplementing with Ensure.  Her weight has remained stable and today she weighs 129 pounds.  Reports sleep as good  Patient denies any suicidality, homicidality or perceptual disturbances.  Continues to  follow-up with her therapist, although she struggles with the fact that her therapist is very young she believes therapy sessions are beneficial.    Visit Diagnosis:    ICD-10-CM   1. MDD (major depressive disorder), recurrent, in partial remission (HCC)  F33.41 mirtazapine (REMERON) 15 MG tablet    2. Generalized anxiety disorder  F41.1 mirtazapine (REMERON) 15 MG tablet    3. High risk medication use  Z79.899       Past Psychiatric History: Reviewed past psychiatric history from progress note on 01/12/2020.  Patient attempted TMS-07/17/2022-however could not tolerate due to side effects of headaches.  Past Medical History:  Past Medical History:  Diagnosis Date   Depression 2000   Heart murmur    Hypercholesterolemia Osteoporosis   Hyperlipemia     Past Surgical History:  Procedure Laterality Date   ABDOMINAL HYSTERECTOMY  1980   COLONOSCOPY WITH PROPOFOL N/A 03/05/2018   Procedure: COLONOSCOPY WITH PROPOFOL;  Surgeon: Toledo, [name], MD;  Location: [facility] ENDOSCOPY;  Service: Endoscopy;  Laterality: N/A;    Family Psychiatric History: Reviewed family psychiatric history from progress note on 01/12/2020.  Family History:  Family History  Problem Relation Age of Onset   Heart disease Mother    Diabetes Mother    Heart disease Father    Diabetes Father    Colon cancer Other    Congestive Heart Failure Brother  Colon cancer Brother    Mental illness Paternal Aunt    BRCA 1/2 Neg Hx    Breast cancer Neg Hx    Cowden syndrome Neg Hx    DES usage Neg Hx    Endometrial cancer Neg Hx    Li-Fraumeni syndrome Neg Hx    Ovarian cancer Neg Hx     Social History: Reviewed social history from progress note on 01/12/2020. Social History   Socioeconomic History   Marital status: Married    Spouse name: robert   Number of children: 2   Years of education: Not on file   Highest education level: Not on file  Occupational History   Not on file  Tobacco Use   Smoking status:  Never   Smokeless tobacco: Never  Vaping Use   Vaping Use: Never used  Substance and Sexual Activity   Alcohol use: Yes    Alcohol/week: 0.0 standard drinks of alcohol    Comment: occasionally - socially during the summer a glass of wine or beer   Drug use: No   Sexual activity: Yes  Other Topics Concern   Not on file  Social History Narrative   Not on file   Social Determinants of Health   Financial Resource Strain: Low Risk  (02/27/2018)   Overall Financial Resource Strain (CARDIA)    Difficulty of Paying Living Expenses: Not hard at all  Food Insecurity: No Food Insecurity (02/27/2018)   Hunger Vital Sign    Worried About Running Out of Food in the Last Year: Never true    [place] in the Last Year: Never true  Transportation Needs: No Transportation Needs (02/27/2018)   PRAPARE - Hydrologist (Medical): No    Lack of Transportation (Non-Medical): No  Physical Activity: Sufficiently Active (03/11/2019)   Exercise Vital Sign    Days of Exercise per Week: 5 days    Minutes of Exercise per Session: 30 min  Stress: Stress Concern Present (11/13/2019)   [place]    Feeling of Stress : To some extent  Social Connections: Unknown (11/13/2019)   Social Connection and Isolation Panel [NHANES]    Frequency of Communication with Friends and Family: Not on file    Frequency of Social Gatherings with Friends and Family: Not on file    Attends Religious Services: Not on file    Active Member of Clubs or Organizations: Not on file    Attends Archivist Meetings: Not on file    Marital Status: Married    Allergies: No Known Allergies  Metabolic Disorder Labs: Lab Results  Component Value Date   HGBA1C 6.6 (H) 07/19/2022   No results found for: "PROLACTIN" Lab Results  Component Value Date   CHOL 135 07/19/2022   TRIG 75.0 07/19/2022   HDL 69.90 07/19/2022   CHOLHDL 2  07/19/2022   VLDL 15.0 07/19/2022   LDLCALC 50 07/19/2022   LDLCALC 67 03/21/2022   Lab Results  Component Value Date   TSH 4.46 03/21/2022   TSH 3.32 11/29/2020    Therapeutic Level Labs: No results found for: "LITHIUM" No results found for: "VALPROATE" No results found for: "CBMZ"  Current Medications: Current Outpatient Medications  Medication Sig Dispense Refill   ALPRAZolam (XANAX) 0.25 MG tablet Take 1 tablet (0.25 mg total) by mouth as directed. Take daily as needed for severe anxiety attacks ,please limit use 15 tablet 0   amLODipine (  NORVASC) 2.5 MG tablet Take 1 tablet (2.5 mg total) by mouth daily. In am 90 tablet 3   calcium carbonate (OS-CAL) 600 MG TABS tablet Take 600 mg by mouth 2 (two) times daily with a meal.     escitalopram (LEXAPRO) 5 MG tablet Take 1 tablet (5 mg total) by mouth daily with breakfast. 30 tablet 1   losartan (COZAAR) 25 MG tablet Take 1 tablet (25 mg total) by mouth daily. 90 tablet 1   rosuvastatin (CRESTOR) 20 MG tablet Take 1 tablet (20 mg total) by mouth at bedtime. 90 tablet 3   VITAMIN D PO Take by mouth daily.     fluorouracil (EFUDEX) 5 % cream Apply 1 Application topically 2 (two) times daily. (Patient not taking: Reported on 09/20/2022)     [START ON 10/16/2022] mirtazapine (REMERON) 15 MG tablet Take 1.5 tablets (22.5 mg total) by mouth at bedtime. 45 tablet 1   No current facility-administered medications for this visit.     Musculoskeletal: Strength & Muscle Tone: within normal limits Gait & Station: normal Patient leans: N/A  Psychiatric Specialty Exam: Review of Systems  Gastrointestinal:  Positive for nausea.  Neurological:  Positive for headaches.  All other systems reviewed and are negative.   Blood pressure 127/66, pulse 87, temperature 98.2 F (36.8 C), temperature source Oral, height 5' 3"  (1.6 m), weight 129 lb (58.5 kg), last menstrual period 11/27/1979.Body mass index is 22.85 kg/m.  General Appearance: Casual   Eye Contact:  Fair  Speech:  Normal Rate  Volume:  Normal  Mood:  Dysphoric  Affect:  Appropriate  Thought Process:  Goal Directed and Descriptions of Associations: Intact  Orientation:  Full (Time, Place, and Person)  Thought Content: Logical   Suicidal Thoughts:  No  Homicidal Thoughts:  No  Memory:  Immediate;   Fair Recent;   Fair Remote;   Fair  Judgement:  Fair  Insight:  Fair  Psychomotor Activity:  Normal  Concentration:  Concentration: Good and Attention Span: Fair  Recall:  [employer] of Knowledge: Fair  Language: Fair  Akathisia:  No  Handed:  Right  AIMS (if indicated): done  Assets:  Communication Skills Desire for [place] Talents/Skills Transportation  ADL's:  Intact  Cognition: WNL  Sleep:  Fair   Screenings: [place] Office Visit from 06/20/2022 in [place] Office Visit from 04/24/2022 in [place] Total Score 0 0      AUDIT    Flowsheet Row Admission (Discharged) from 05/12/2022 in [place] Admission (Discharged) from 12/17/2019 in [place] Admission (Discharged) from OP Visit from 12/09/2012 in [place] [facility]  Alcohol Use Disorder Identification Test Final Score (AUDIT) 0 0 0      GAD-7    Flowsheet Row Office Visit from 09/20/2022 in [place] Office Visit from 08/22/2022 in [place] Office Visit from 08/18/2022 in [place] Office Visit from 07/10/2022 in [place] Video Visit from 01/18/2022 in [place]  Total GAD-7 Score 6 20 7 18  0      Mini-Mental    [place] from 02/27/2018 in [place] from 02/02/2017 in [facility] from [facility]03/2016 in [facility]  Total Score (max 30 points ) 30 30 30  [place] Office Visit from 09/20/2022 in [place] Office Visit from 08/22/2022 in [place] Office Visit from 08/18/2022 in [facility] Video Visit from 07/25/2022 in [place] from 07/24/2022 in [place]  PHQ-2 Total Score 2 4 4 6 6   PHQ-9 Total Score 7 16 13 17 22       [place] Office Visit from 09/20/2022 in [place] Office Visit from 08/22/2022 in [place] Video Visit from 07/25/2022 in [place] No Risk No Risk No Risk        Assessment and Plan: Angelica Robertson is a 76 year old Caucasian female, married, retired, lives in [place], has a history of MDD, anxiety, hyperlipidemia, was evaluated in office today.  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  Patient will benefit from the following plan.  Plan MDD in partial remission Mirtazapine 22.5 mg p.o. nightly Will continue Lexapro for now at 5 mg p.o. daily with breakfast However advised patient she could talk to her primary care provider regarding her nausea and if she is able to get that under control could reach out to writer and the Lexapro could be increased with 10 mg p.o. daily. She may benefit from a higher dosage although she is currently improving on the current dose.  GAD-improving Continue mirtazapine 22.5 mg p.o. nightly Lexapro 5 mg p.o. daily   High risk medication use-I have reviewed labs-sodium  level-137-within normal limits.  Reviewed and discussed with patient.  Patient to continue CBT with Ms. Raynelle Jan.  Follow-up in clinic in 4 to 6 weeks or sooner if needed.  This note was generated in part or whole with voice recognition software. Voice recognition is usually quite accurate but there are transcription errors that can and very often do occur. I apologize for any typographical errors that were not detected and corrected.     Ursula Alert, MD 09/21/2022, 2:49 PM

## 2022-09-20 NOTE — Patient Instructions (Signed)
Karen [place], [place] T: [phone] F: [place] , [place] [address] [email or web address]- [phone]

## 2022-09-22 ENCOUNTER — Ambulatory Visit: Payer: Medicare PPO | Admitting: Internal Medicine

## 2022-09-22 ENCOUNTER — Encounter: Payer: Self-pay | Admitting: Internal Medicine

## 2022-09-22 VITALS — BP 128/60 | HR 90 | Temp 98.0°F | Ht 63.0 in | Wt 128.8 lb

## 2022-09-22 DIAGNOSIS — E7800 Pure hypercholesterolemia, unspecified: Secondary | ICD-10-CM | POA: Diagnosis not present

## 2022-09-22 DIAGNOSIS — I10 Essential (primary) hypertension: Secondary | ICD-10-CM | POA: Diagnosis not present

## 2022-09-22 DIAGNOSIS — R110 Nausea: Secondary | ICD-10-CM | POA: Diagnosis not present

## 2022-09-22 DIAGNOSIS — R739 Hyperglycemia, unspecified: Secondary | ICD-10-CM | POA: Diagnosis not present

## 2022-09-22 DIAGNOSIS — F332 Major depressive disorder, recurrent severe without psychotic features: Secondary | ICD-10-CM

## 2022-09-22 DIAGNOSIS — L659 Nonscarring hair loss, unspecified: Secondary | ICD-10-CM | POA: Diagnosis not present

## 2022-09-22 DIAGNOSIS — K635 Polyp of colon: Secondary | ICD-10-CM | POA: Diagnosis not present

## 2022-09-22 DIAGNOSIS — K219 Gastro-esophageal reflux disease without esophagitis: Secondary | ICD-10-CM

## 2022-09-22 MED ORDER — PANTOPRAZOLE SODIUM 40 MG PO TBEC: 40.0000 mg | DELAYED_RELEASE_TABLET | Freq: Every day | ORAL | 2 refills | Status: DC

## 2022-09-22 NOTE — Progress Notes (Unsigned)
Patient ID: Angelica Robertson, female   DOB: [birth year], 76 y.o.   MRN: [id number]   Subjective:    Patient ID: Angelica Robertson, female    DOB: 01/02/1946, 76 y.o.   MRN: [id number]   Patient here for  Chief Complaint  Patient presents with   Follow-up    4 week follow up   .   HPI Here to follow up regarding increased stress/depression, nausea and weight loss.  She reports she is feeling better regarding her depression.  On lexapro.  Is starting to go out more.  Going to church.  Does report some persistent nausea.  Also feels like something in her throat.  No actually dysphagia or odynophagia.  Just feels like something in her throat (something hung up in her throat) - can't clear.  Request ENT referral.  No actual acid reflux.  Does report nausea is worse in am and early day.  Better in the evening.  Feels this is related to eating - thinks food makes better.  Decreased appetite. No increased abdominal pain.  No cough or congestion.  No chest pain.  Breathing stable.  Bowels moving.  Took pepcid for a brief period.  Not sure that it helped.     Past Medical History:  Diagnosis Date   Depression 2000   Heart murmur    Hypercholesterolemia Osteoporosis   Hyperlipemia    Past Surgical History:  Procedure Laterality Date   ABDOMINAL HYSTERECTOMY  1980   COLONOSCOPY WITH PROPOFOL N/A 03/05/2018   Procedure: COLONOSCOPY WITH PROPOFOL;  Surgeon: Toledo, [name], MD;  Location: [facility] ENDOSCOPY;  Service: Endoscopy;  Laterality: N/A;   Family History  Problem Relation Age of Onset   Heart disease Mother    Diabetes Mother    Heart disease Father    Diabetes Father    Colon cancer Other    Congestive Heart Failure Brother    Colon cancer Brother    Mental illness Paternal Aunt    BRCA 1/2 Neg Hx    Breast cancer Neg Hx    Cowden syndrome Neg Hx    DES usage Neg Hx    Endometrial cancer Neg Hx    Li-Fraumeni syndrome Neg Hx    Ovarian cancer Neg Hx    Social History   Socioeconomic  History   Marital status: Married    Spouse name: robert   Number of children: 2   Years of education: Not on file   Highest education level: Not on file  Occupational History   Not on file  Tobacco Use   Smoking status: Never   Smokeless tobacco: Never  Vaping Use   Vaping Use: Never used  Substance and Sexual Activity   Alcohol use: Yes    Alcohol/week: 0.0 standard drinks of alcohol    Comment: occasionally - socially during the summer a glass of wine or beer   Drug use: No   Sexual activity: Yes  Other Topics Concern   Not on file  Social History Narrative   Not on file   Social Determinants of Health   Financial Resource Strain: Low Risk  (02/27/2018)   Overall Financial Resource Strain (CARDIA)    Difficulty of Paying Living Expenses: Not hard at all  Food Insecurity: No Food Insecurity (02/27/2018)   Hunger Vital Sign    Worried About Running Out of Food in the Last Year: Never true    Ran Out of Food in the Last Year: Never true  Transportation  Needs: No Transportation Needs (02/27/2018)   PRAPARE - Hydrologist (Medical): No    Lack of Transportation (Non-Medical): No  Physical Activity: Sufficiently Active (03/11/2019)   Exercise Vital Sign    Days of Exercise per Week: 5 days    Minutes of Exercise per Session: 30 min  Stress: Stress Concern Present (11/13/2019)   [place]    Feeling of Stress : To some extent  Social Connections: Unknown (11/13/2019)   Social Connection and Isolation Panel [NHANES]    Frequency of Communication with Friends and Family: Not on file    Frequency of Social Gatherings with Friends and Family: Not on file    Attends Religious Services: Not on file    Active Member of Clubs or Organizations: Not on file    Attends Archivist Meetings: Not on file    Marital Status: Married     Review of Systems  Constitutional:  Positive  for appetite change.       Weight stable from last check.   HENT:  Negative for congestion and sinus pressure.   Respiratory:  Negative for cough, chest tightness and shortness of breath.   Cardiovascular:  Negative for chest pain, palpitations and leg swelling.  Gastrointestinal:  Positive for nausea. Negative for constipation, diarrhea and vomiting.  Genitourinary:  Negative for difficulty urinating and dysuria.  Musculoskeletal:  Negative for joint swelling and myalgias.  Skin:  Negative for color change and rash.  Neurological:  Negative for dizziness, light-headedness and headaches.  Psychiatric/Behavioral:  Negative for agitation.        Doing better regarding her depression.        Objective:     BP 128/60 (BP Location: Left Arm, Patient Position: Sitting, Cuff Size: Normal)   Pulse 90   Temp 98 F (36.7 C) (Oral)   Ht _0  (1.6 m)   Wt 128 lb 12.8 oz (58.4 kg)   LMP 11/27/1979   SpO2 95%   BMI 22.82 kg/m  Wt Readings from Last 3 Encounters:  09/22/22 128 lb 12.8 oz (58.4 kg)  09/20/22 129 lb (58.5 kg)  08/22/22 128 lb (58.1 kg)    Physical Exam Vitals reviewed.  Constitutional:      General: She is not in acute distress.    Appearance: Normal appearance.  HENT:     Head: Normocephalic and atraumatic.     Right Ear: External ear normal.     Left Ear: External ear normal.  Eyes:     General: No scleral icterus.       Right eye: No discharge.        Left eye: No discharge.     Conjunctiva/sclera: Conjunctivae normal.  Neck:     Thyroid: No thyromegaly.  Cardiovascular:     Rate and Rhythm: Normal rate and regular rhythm.  Pulmonary:     Effort: No respiratory distress.     Breath sounds: Normal breath sounds. No wheezing.  Abdominal:     General: Bowel sounds are normal.     Palpations: Abdomen is soft.     Tenderness: There is no abdominal tenderness.  Musculoskeletal:        General: No swelling or tenderness.     Cervical back: Neck supple. No  tenderness.  Lymphadenopathy:     Cervical: No cervical adenopathy.  Skin:    Findings: No erythema or rash.  Neurological:     Mental Status:  She is alert.  Psychiatric:        Mood and Affect: Mood normal.        Behavior: Behavior normal.      Outpatient Encounter Medications as of 09/22/2022  Medication Sig   ALPRAZolam (XANAX) 0.25 MG tablet Take 1 tablet (0.25 mg total) by mouth as directed. Take daily as needed for severe anxiety attacks ,please limit use   amLODipine (NORVASC) 2.5 MG tablet Take 1 tablet (2.5 mg total) by mouth daily. In am   calcium carbonate (OS-CAL) 600 MG TABS tablet Take 600 mg by mouth 2 (two) times daily with a meal.   escitalopram (LEXAPRO) 5 MG tablet Take 1 tablet (5 mg total) by mouth daily with breakfast.   fluorouracil (EFUDEX) 5 % cream Apply 1 Application topically 2 (two) times daily.   losartan (COZAAR) 25 MG tablet Take 1 tablet (25 mg total) by mouth daily.   [START ON 10/16/2022] mirtazapine (REMERON) 15 MG tablet Take 1.5 tablets (22.5 mg total) by mouth at bedtime.   pantoprazole (PROTONIX) 40 MG tablet Take 1 tablet (40 mg total) by mouth daily. Take 30 minutes before your evening meal   rosuvastatin (CRESTOR) 20 MG tablet Take 1 tablet (20 mg total) by mouth at bedtime.   VITAMIN D PO Take by mouth daily.   No facility-administered encounter medications on file as of 09/22/2022.     Lab Results  Component Value Date   WBC 9.8 09/22/2022   HGB 13.0 09/22/2022   HCT 37.6 09/22/2022   PLT 340 09/22/2022   GLUCOSE 103 (H) 09/22/2022   CHOL 135 07/19/2022   TRIG 75.0 07/19/2022   HDL 69.90 07/19/2022   LDLCALC 50 07/19/2022   ALT 20 09/22/2022   AST 20 09/22/2022   NA 137 09/22/2022   K 4.0 09/22/2022   CL 100 09/22/2022   CREATININE 0.60 09/22/2022   BUN 21 09/22/2022   CO2 25 09/22/2022   TSH 1.20 09/22/2022   HGBA1C 6.6 (H) 07/19/2022    MM 3D SCREEN BREAST BILATERAL  Result Date: 06/19/2022 CLINICAL DATA:   Screening. EXAM: DIGITAL SCREENING BILATERAL MAMMOGRAM WITH TOMOSYNTHESIS AND CAD TECHNIQUE: Bilateral screening digital craniocaudal and mediolateral oblique mammograms were obtained. Bilateral screening digital breast tomosynthesis was performed. The images were evaluated with computer-aided detection. COMPARISON:  Previous exam(s). ACR Breast Density Category b: There are scattered areas of fibroglandular density. FINDINGS: There are no findings suspicious for malignancy. IMPRESSION: No mammographic evidence of malignancy. A result letter of this screening mammogram will be mailed directly to the patient. RECOMMENDATION: Screening mammogram in one year. (Code:SM-B-01Y) BI-RADS CATEGORY  1: Negative. Electronically Signed   By: Claudie Revering M.D.   On: 06/19/2022 18:14       Assessment & Plan:   Problem List Items Addressed This Visit     Acid reflux    No actual acid reflux reported.  Persistent nausea.  Worse in am and early day.  Food seems to improve symptoms.  Will treat with protonix as directed.  Check abdominal ultrasound and routine labs.        Relevant Medications   pantoprazole (PROTONIX) 40 MG tablet   Colon polyps    Colonoscopy 2019 - normal.  Non bleeding internal hemorrhoids.       Hair loss    Reports increased hair loss.  Discussed the increased stress could be contributing.  Check cbc and tsh.        Relevant Orders   TSH (Completed)  Hypercholesterolemia    On crestor.  Follow lipid panel and liver function tests.        Hyperglycemia    Follow met b and a1c.       Hypertension - Primary    Continue losartan and amlodipine. Pressures as outlined.  No change in medication.  Follow pressures.  Follow metabolic panel.       Relevant Orders   Basic metabolic panel   BASIC METABOLIC PANEL WITH GFR (Completed)   Major depressive disorder, recurrent, severe without psychotic features ([place])    Followed by psych.  Doing some better.  Continue lexapro.         Nausea      Persistent nausea.  Worse in am and early day.  Food seems to improve symptoms.  Will treat with protonix as directed.  Check abdominal ultrasound and routine labs. Also feels as if something in her throat (handling up in her throat).  Discussed protonix.  Request ENT evaluation.       Relevant Orders   CBC with Differential/Platelet (Completed)   Hepatic function panel (Completed)   Lipase (Completed)   US Abdomen Complete   Ambulatory referral to ENT     Einar Pheasant, MD

## 2022-09-23 ENCOUNTER — Encounter: Payer: Self-pay | Admitting: Internal Medicine

## 2022-09-23 LAB — CBC WITH DIFFERENTIAL/PLATELET
Absolute Monocytes: 911 cells/uL (ref 200–950)
Basophils Absolute: 59 cells/uL (ref 0–200)
Basophils Relative: 0.6 %
Eosinophils Absolute: 98 cells/uL (ref 15–500)
Eosinophils Relative: 1 %
HCT: 37.6 % (ref 35.0–45.0)
Hemoglobin: 13 g/dL (ref 11.7–15.5)
Lymphs Abs: 2009 cells/uL (ref 850–3900)
MCH: 30.7 pg (ref 27.0–33.0)
MCHC: 34.6 g/dL (ref 32.0–36.0)
MCV: 88.9 fL (ref 80.0–100.0)
MPV: 8.9 fL (ref 7.5–12.5)
Monocytes Relative: 9.3 %
Neutro Abs: 6723 cells/uL (ref 1500–7800)
Neutrophils Relative %: 68.6 %
Platelets: 340 10*3/uL (ref 140–400)
RBC: 4.23 10*6/uL (ref 3.80–5.10)
RDW: 12.2 % (ref 11.0–15.0)
Total Lymphocyte: 20.5 %
WBC: 9.8 10*3/uL (ref 3.8–10.8)

## 2022-09-23 LAB — BASIC METABOLIC PANEL WITH GFR
BUN: 21 mg/dL (ref 7–25)
CO2: 25 mmol/L (ref 20–32)
Calcium: 10 mg/dL (ref 8.6–10.4)
Chloride: 100 mmol/L (ref 98–110)
Creat: 0.6 mg/dL (ref 0.60–1.00)
Glucose, Bld: 103 mg/dL — ABNORMAL HIGH (ref 65–99)
Potassium: 4 mmol/L (ref 3.5–5.3)
Sodium: 137 mmol/L (ref 135–146)
eGFR: 94 mL/min/{1.73_m2} (ref >=60)

## 2022-09-23 LAB — HEPATIC FUNCTION PANEL
AG Ratio: 2 (calc) (ref 1.0–2.5)
ALT: 20 U/L (ref 6–29)
AST: 20 U/L (ref 10–35)
Albumin: 4.3 g/dL (ref 3.6–5.1)
Alkaline phosphatase (APISO): 50 U/L (ref 37–153)
Bilirubin, Direct: 0.1 mg/dL (ref 0.0–0.2)
Globulin: 2.2 g/dL (calc) (ref 1.9–3.7)
Indirect Bilirubin: 0.2 mg/dL (calc) (ref 0.2–1.2)
Total Bilirubin: 0.3 mg/dL (ref 0.2–1.2)
Total Protein: 6.5 g/dL (ref 6.1–8.1)

## 2022-09-23 LAB — TSH: TSH: 1.2 mIU/L (ref 0.40–4.50)

## 2022-09-23 LAB — LIPASE: Lipase: 26 U/L (ref 7–60)

## 2022-09-23 NOTE — Assessment & Plan Note (Signed)
No actual acid reflux reported.  Persistent nausea.  Worse in am and early day.  Food seems to improve symptoms.  Will treat with protonix as directed.  Check abdominal ultrasound and routine labs.

## 2022-09-23 NOTE — Assessment & Plan Note (Signed)
Followed by psych.  Doing some better.  Continue lexapro.

## 2022-09-23 NOTE — Assessment & Plan Note (Signed)
Persistent nausea.  Worse in am and early day.  Food seems to improve symptoms.  Will treat with protonix as directed.  Check abdominal ultrasound and routine labs. Also feels as if something in her throat (handling up in her throat).  Discussed protonix.  Request ENT evaluation.

## 2022-09-23 NOTE — Assessment & Plan Note (Signed)
Continue losartan and amlodipine. Pressures as outlined.  No change in medication.  Follow pressures.  Follow metabolic panel.

## 2022-09-23 NOTE — Assessment & Plan Note (Signed)
Reports increased hair loss.  Discussed the increased stress could be contributing.  Check cbc and tsh.

## 2022-09-23 NOTE — Assessment & Plan Note (Signed)
Follow met b and a1c.  

## 2022-09-23 NOTE — Assessment & Plan Note (Signed)
On crestor.  Follow lipid panel and liver function tests.   

## 2022-09-23 NOTE — Assessment & Plan Note (Signed)
Colonoscopy 2019 - normal.  Non bleeding internal hemorrhoids.  

## 2022-09-25 ENCOUNTER — Telehealth: Payer: Self-pay

## 2022-09-25 NOTE — Telephone Encounter (Signed)
Noted , please let her know to give it a week or so and let me know if she is interested in increasing the dosage of her antidepressant.

## 2022-09-25 NOTE — Addendum Note (Signed)
Addended by: Neta Ehlers on: 09/25/2022 08:04 AM   Modules accepted: Orders

## 2022-09-25 NOTE — Telephone Encounter (Signed)
pt notified of instructions per dr. Shea Evans order.

## 2022-09-25 NOTE — Telephone Encounter (Signed)
pt left a message that she seen dr. Nicki Reaper and that she put her on protonix and ordered a u/s

## 2022-09-27 ENCOUNTER — Ambulatory Visit
Admission: RE | Admit: 2022-09-27 | Discharge: 2022-09-27 | Disposition: A | Discharge status: Home / Self Care | Payer: Medicare PPO | Source: Ambulatory Visit | Attending: Internal Medicine | Admitting: Internal Medicine

## 2022-09-27 DIAGNOSIS — R110 Nausea: Secondary | ICD-10-CM | POA: Diagnosis not present

## 2022-09-27 DIAGNOSIS — K7689 Other specified diseases of liver: Secondary | ICD-10-CM | POA: Diagnosis not present

## 2022-09-27 DIAGNOSIS — R634 Abnormal weight loss: Secondary | ICD-10-CM | POA: Diagnosis not present

## 2022-09-28 ENCOUNTER — Other Ambulatory Visit: Payer: Self-pay

## 2022-09-28 DIAGNOSIS — R634 Abnormal weight loss: Secondary | ICD-10-CM

## 2022-09-28 DIAGNOSIS — R110 Nausea: Secondary | ICD-10-CM

## 2022-10-02 ENCOUNTER — Ambulatory Visit (INDEPENDENT_AMBULATORY_CARE_PROVIDER_SITE_OTHER): Payer: Medicare PPO

## 2022-10-02 DIAGNOSIS — E538 Deficiency of other specified B group vitamins: Secondary | ICD-10-CM

## 2022-10-02 MED ORDER — CYANOCOBALAMIN 1000 MCG/ML IJ SOLN
1000.0000 ug | Freq: Once | INTRAMUSCULAR | Status: AC
  Administered 2022-10-02: 1000 ug via INTRAMUSCULAR

## 2022-10-02 NOTE — Progress Notes (Signed)
Patient presented for B 12 injection to left deltoid, patient voiced no concerns nor showed any signs of distress during injection. 

## 2022-10-03 ENCOUNTER — Encounter: Payer: Self-pay | Admitting: Internal Medicine

## 2022-10-03 DIAGNOSIS — F4323 Adjustment disorder with mixed anxiety and depressed mood: Secondary | ICD-10-CM | POA: Diagnosis not present

## 2022-10-03 DIAGNOSIS — F332 Major depressive disorder, recurrent severe without psychotic features: Secondary | ICD-10-CM | POA: Diagnosis not present

## 2022-10-04 DIAGNOSIS — D2272 Melanocytic nevi of left lower limb, including hip: Secondary | ICD-10-CM | POA: Diagnosis not present

## 2022-10-04 DIAGNOSIS — D225 Melanocytic nevi of trunk: Secondary | ICD-10-CM | POA: Diagnosis not present

## 2022-10-04 DIAGNOSIS — L565 Disseminated superficial actinic porokeratosis (DSAP): Secondary | ICD-10-CM | POA: Diagnosis not present

## 2022-10-04 DIAGNOSIS — D2271 Melanocytic nevi of right lower limb, including hip: Secondary | ICD-10-CM | POA: Diagnosis not present

## 2022-10-04 DIAGNOSIS — D2262 Melanocytic nevi of left upper limb, including shoulder: Secondary | ICD-10-CM | POA: Diagnosis not present

## 2022-10-04 DIAGNOSIS — L821 Other seborrheic keratosis: Secondary | ICD-10-CM | POA: Diagnosis not present

## 2022-10-04 DIAGNOSIS — D2261 Melanocytic nevi of right upper limb, including shoulder: Secondary | ICD-10-CM | POA: Diagnosis not present

## 2022-10-09 DIAGNOSIS — K219 Gastro-esophageal reflux disease without esophagitis: Secondary | ICD-10-CM | POA: Diagnosis not present

## 2022-10-09 DIAGNOSIS — R0989 Other specified symptoms and signs involving the circulatory and respiratory systems: Secondary | ICD-10-CM | POA: Diagnosis not present

## 2022-10-10 ENCOUNTER — Encounter: Payer: Self-pay | Admitting: Internal Medicine

## 2022-10-10 DIAGNOSIS — K219 Gastro-esophageal reflux disease without esophagitis: Secondary | ICD-10-CM | POA: Insufficient documentation

## 2022-10-13 ENCOUNTER — Ambulatory Visit: Payer: Medicare PPO | Admitting: Psychiatry

## 2022-10-16 ENCOUNTER — Telehealth: Payer: Self-pay

## 2022-10-16 DIAGNOSIS — F3341 Major depressive disorder, recurrent, in partial remission: Secondary | ICD-10-CM

## 2022-10-16 MED ORDER — ESCITALOPRAM OXALATE 10 MG PO TABS: 10.0000 mg | ORAL_TABLET | Freq: Every day | ORAL | 0 refills | Status: DC

## 2022-10-16 NOTE — Telephone Encounter (Signed)
Okay to increase Lexapro to 10 mg p.o. daily.  I have sent the new prescription to her pharmacy at [place].

## 2022-10-16 NOTE — Telephone Encounter (Signed)
pt called left message that she like to increase the dosage of the lexapro to 10mg . pt last seen on 10-11 next appt 12-06

## 2022-10-17 DIAGNOSIS — F332 Major depressive disorder, recurrent severe without psychotic features: Secondary | ICD-10-CM | POA: Diagnosis not present

## 2022-10-17 DIAGNOSIS — F4323 Adjustment disorder with mixed anxiety and depressed mood: Secondary | ICD-10-CM | POA: Diagnosis not present

## 2022-10-17 NOTE — Telephone Encounter (Signed)
Pt.notified

## 2022-10-26 ENCOUNTER — Encounter: Payer: Self-pay | Admitting: Internal Medicine

## 2022-10-26 ENCOUNTER — Ambulatory Visit: Payer: Medicare PPO | Admitting: Internal Medicine

## 2022-10-26 VITALS — BP 120/78 | HR 85 | Temp 97.9°F | Ht 63.0 in | Wt 125.9 lb

## 2022-10-26 DIAGNOSIS — K635 Polyp of colon: Secondary | ICD-10-CM

## 2022-10-26 DIAGNOSIS — K219 Gastro-esophageal reflux disease without esophagitis: Secondary | ICD-10-CM

## 2022-10-26 DIAGNOSIS — M542 Cervicalgia: Secondary | ICD-10-CM | POA: Diagnosis not present

## 2022-10-26 DIAGNOSIS — F332 Major depressive disorder, recurrent severe without psychotic features: Secondary | ICD-10-CM

## 2022-10-26 DIAGNOSIS — I10 Essential (primary) hypertension: Secondary | ICD-10-CM | POA: Diagnosis not present

## 2022-10-26 DIAGNOSIS — R739 Hyperglycemia, unspecified: Secondary | ICD-10-CM | POA: Diagnosis not present

## 2022-10-26 DIAGNOSIS — E7800 Pure hypercholesterolemia, unspecified: Secondary | ICD-10-CM | POA: Diagnosis not present

## 2022-10-26 NOTE — Progress Notes (Signed)
Patient ID: Angelica Robertson, female   DOB: [birth year], 76 y.o.   MRN: [id number]   Subjective:    Patient ID: Angelica Robertson, female    DOB: March 01, 1946, 76 y.o.   MRN: [id number]   Patient here for  Chief Complaint  Patient presents with   Follow-up   .   HPI Follow up regarding increased stress/depression.  On lexapro.  Doing better.  Feels better.  Taking 7.38m q day.  She is reading more.  Cooking.  Getting back into the gym.  No chest pain or sob reported.  No abdominal pain.  Seeing psychiatry.    Past Medical History:  Diagnosis Date   Depression 2000   Heart murmur    Hypercholesterolemia Osteoporosis   Hyperlipemia    Past Surgical History:  Procedure Laterality Date   ABDOMINAL HYSTERECTOMY  1980   COLONOSCOPY WITH PROPOFOL N/A 03/05/2018   Procedure: COLONOSCOPY WITH PROPOFOL;  Surgeon: Toledo, TBenay Pike MD;  Location: [facility] ENDOSCOPY;  Service: Endoscopy;  Laterality: N/A;   Family History  Problem Relation Age of Onset   Heart disease Mother    Diabetes Mother    Heart disease Father    Diabetes Father    Colon cancer Other    Congestive Heart Failure Brother    Colon cancer Brother    Mental illness Paternal Aunt    BRCA 1/2 Neg Hx    Breast cancer Neg Hx    Cowden syndrome Neg Hx    DES usage Neg Hx    Endometrial cancer Neg Hx    Li-Fraumeni syndrome Neg Hx    Ovarian cancer Neg Hx    Social History   Socioeconomic History   Marital status: Married    Spouse name: robert   Number of children: 2   Years of education: Not on file   Highest education level: Not on file  Occupational History   Not on file  Tobacco Use   Smoking status: Never   Smokeless tobacco: Never  Vaping Use   Vaping Use: Never used  Substance and Sexual Activity   Alcohol use: Yes    Alcohol/week: 0.0 standard drinks of alcohol    Comment: occasionally - socially during the summer a glass of wine or beer   Drug use: No   Sexual activity: Yes  Other Topics Concern   Not on  file  Social History Narrative   Not on file   Social Determinants of Health   Financial Resource Strain: Low Risk  (02/27/2018)   Overall Financial Resource Strain (CARDIA)    Difficulty of Paying Living Expenses: Not hard at all  Food Insecurity: No Food Insecurity (02/27/2018)   Hunger Vital Sign    Worried About Running Out of Food in the Last Year: Never true    RBushyheadin the Last Year: Never true  Transportation Needs: No Transportation Needs (02/27/2018)   PRAPARE - THydrologist(Medical): No    Lack of Transportation (Non-Medical): No  Physical Activity: Sufficiently Active (03/11/2019)   Exercise Vital Sign    Days of Exercise per Week: 5 days    Minutes of Exercise per Session: 30 min  Stress: Stress Concern Present (11/13/2019)   [place]   Feeling of Stress : To some extent  Social Connections: Unknown (11/13/2019)   Social Connection and Isolation Panel [NHANES]    Frequency of Communication with Friends and  Family: Not on file    Frequency of Social Gatherings with Friends and Family: Not on file    Attends Religious Services: Not on file    Active Member of Clubs or Organizations: Not on file    Attends Archivist Meetings: Not on file    Marital Status: Married     Review of Systems  Constitutional:  Negative for appetite change and unexpected weight change.  HENT:  Negative for congestion and sinus pressure.   Respiratory:  Negative for cough, chest tightness and shortness of breath.   Cardiovascular:  Negative for chest pain, palpitations and leg swelling.  Gastrointestinal:  Negative for abdominal pain, diarrhea and vomiting.  Genitourinary:  Negative for difficulty urinating and dysuria.  Musculoskeletal:  Negative for joint swelling and myalgias.  Skin:  Negative for color change and rash.  Neurological:  Negative for dizziness and headaches.   Psychiatric/Behavioral:  Negative for agitation and dysphoric mood.        Objective:     BP 120/78   Pulse 85   Temp 97.9 F (36.6 C) (Oral)   Wt 125 lb 14.4 oz (57.1 kg)   LMP 11/27/1979   SpO2 96%   BMI 22.30 kg/m  Wt Readings from Last 3 Encounters:  10/26/22 125 lb 14.4 oz (57.1 kg)  09/22/22 128 lb 12.8 oz (58.4 kg)  08/18/22 128 lb 12.8 oz (58.4 kg)    Physical Exam Vitals reviewed.  Constitutional:      General: She is not in acute distress.    Appearance: Normal appearance.  HENT:     Head: Normocephalic and atraumatic.     Right Ear: External ear normal.     Left Ear: External ear normal.  Eyes:     General: No scleral icterus.       Right eye: No discharge.        Left eye: No discharge.     Conjunctiva/sclera: Conjunctivae normal.  Neck:     Thyroid: No thyromegaly.  Cardiovascular:     Rate and Rhythm: Normal rate and regular rhythm.  Pulmonary:     Effort: No respiratory distress.     Breath sounds: Normal breath sounds. No wheezing.  Abdominal:     General: Bowel sounds are normal.     Palpations: Abdomen is soft.     Tenderness: There is no abdominal tenderness.  Musculoskeletal:        General: No swelling or tenderness.     Cervical back: Neck supple. No tenderness.  Lymphadenopathy:     Cervical: No cervical adenopathy.  Skin:    Findings: No erythema or rash.  Neurological:     Mental Status: She is alert.  Psychiatric:        Mood and Affect: Mood normal.        Behavior: Behavior normal.      Outpatient Encounter Medications as of 10/26/2022  Medication Sig   ALPRAZolam (XANAX) 0.25 MG tablet Take 1 tablet (0.25 mg total) by mouth as directed. Take daily as needed for severe anxiety attacks ,please limit use   amLODipine (NORVASC) 2.5 MG tablet Take 1 tablet (2.5 mg total) by mouth daily. In am   calcium carbonate (OS-CAL) 600 MG TABS tablet Take 600 mg by mouth 2 (two) times daily with a meal.   escitalopram (LEXAPRO) 10 MG  tablet Take 1 tablet (10 mg total) by mouth daily with breakfast.   fluorouracil (EFUDEX) 5 % cream Apply 1 Application topically 2 (two)  times daily.   losartan (COZAAR) 25 MG tablet Take 1 tablet (25 mg total) by mouth daily.   mirtazapine (REMERON) 15 MG tablet Take 1.5 tablets (22.5 mg total) by mouth at bedtime.   pantoprazole (PROTONIX) 40 MG tablet Take 1 tablet (40 mg total) by mouth daily. Take 30 minutes before your evening meal   rosuvastatin (CRESTOR) 20 MG tablet Take 1 tablet (20 mg total) by mouth at bedtime.   VITAMIN D PO Take by mouth daily.   No facility-administered encounter medications on file as of 10/26/2022.     Lab Results  Component Value Date   WBC 9.8 09/22/2022   HGB 13.0 09/22/2022   HCT 37.6 09/22/2022   PLT 340 09/22/2022   GLUCOSE 103 (H) 09/22/2022   CHOL 135 07/19/2022   TRIG 75.0 07/19/2022   HDL 69.90 07/19/2022   LDLCALC 50 07/19/2022   ALT 20 09/22/2022   AST 20 09/22/2022   NA 137 09/22/2022   K 4.0 09/22/2022   CL 100 09/22/2022   CREATININE 0.60 09/22/2022   BUN 21 09/22/2022   CO2 25 09/22/2022   TSH 1.20 09/22/2022   HGBA1C 6.6 (H) 07/19/2022    US Abdomen Complete  Result Date: 09/27/2022 CLINICAL DATA:  Nausea weight loss EXAM: ABDOMEN ULTRASOUND COMPLETE COMPARISON:  None Available. FINDINGS: Gallbladder: No gallstones or wall thickening visualized. No sonographic Murphy sign noted by sonographer. Common bile duct: Diameter: 3 mm Liver: Appears slightly echogenic. Hepatic cysts. Exophytic left hepatic cyst measuring 5 x 5.9 x 6.6 cm. Portal vein is patent on color Doppler imaging with normal direction of blood flow towards the liver. IVC: No abnormality visualized. Pancreas: Visualized portion unremarkable. Spleen: Size and appearance within normal limits. Right Kidney: Length: 11.1 cm. Echogenicity within normal limits. No mass or hydronephrosis visualized. Left Kidney: Length: 11.3 cm. Echogenicity within normal limits. No mass or  hydronephrosis visualized. Abdominal aorta: No aneurysm visualized. Other findings: None. IMPRESSION: 1. Liver slightly echogenic suggesting hepatic steatosis 2. Benign appearing liver cysts for which no specific imaging follow-up is recommended 3. Otherwise negative examination Electronically Signed   By: Donavan Foil M.D.   On: 09/27/2022 23:14       Assessment & Plan:   Problem List Items Addressed This Visit     Acid reflux - Primary    Was having persistent nausea.  Ultrasound - fatty liver.  On protonix.  Has been referred to GI.  ENT - pepcid      Cervicalgia    Neck - better.  Follow.       Colon polyps    Colonoscopy 2019 - normal.  Non bleeding internal hemorrhoids.       Hypercholesterolemia    On crestor.  Follow lipid panel and liver function tests.        Relevant Orders   Hepatic function panel   Lipid panel   Hyperglycemia    Follow met b and a1c.       Relevant Orders   Hemoglobin A1c   Hypertension    Continue losartan and amlodipine. Pressures as outlined.  No change in medication.  Follow pressures.  Follow metabolic panel.       Relevant Orders   Basic metabolic panel   Major depressive disorder, recurrent, severe without psychotic features ([place])    Followed by psych.  Doing better.  Continue lexapro.          Einar Pheasant, MD

## 2022-11-05 ENCOUNTER — Encounter: Payer: Self-pay | Admitting: Internal Medicine

## 2022-11-05 NOTE — Assessment & Plan Note (Signed)
Continue losartan and amlodipine. Pressures as outlined.  No change in medication.  Follow pressures.  Follow metabolic panel.

## 2022-11-05 NOTE — Assessment & Plan Note (Signed)
Neck - better.  Follow.  

## 2022-11-05 NOTE — Assessment & Plan Note (Signed)
Follow met b and a1c.  

## 2022-11-05 NOTE — Assessment & Plan Note (Signed)
Colonoscopy 2019 - normal.  Non bleeding internal hemorrhoids.  

## 2022-11-05 NOTE — Assessment & Plan Note (Signed)
Followed by psych.  Doing better.  Continue lexapro.

## 2022-11-05 NOTE — Assessment & Plan Note (Signed)
Was having persistent nausea.  Ultrasound - fatty liver.  On protonix.  Has been referred to GI.  ENT - pepcid

## 2022-11-05 NOTE — Assessment & Plan Note (Signed)
On crestor.  Follow lipid panel and liver function tests.   

## 2022-11-06 ENCOUNTER — Ambulatory Visit (INDEPENDENT_AMBULATORY_CARE_PROVIDER_SITE_OTHER): Payer: Medicare PPO

## 2022-11-06 DIAGNOSIS — E538 Deficiency of other specified B group vitamins: Secondary | ICD-10-CM

## 2022-11-06 MED ORDER — CYANOCOBALAMIN 1000 MCG/ML IJ SOLN
1000.0000 ug | Freq: Once | INTRAMUSCULAR | Status: AC
  Administered 2022-11-06: 1000 ug via INTRAMUSCULAR

## 2022-11-06 NOTE — Progress Notes (Signed)
Pt presented for their vitamin B12 injection. Pt was identified through two identifiers. Pt tolerated shot well in their left  deltoid.  

## 2022-11-07 DIAGNOSIS — F3341 Major depressive disorder, recurrent, in partial remission: Secondary | ICD-10-CM | POA: Diagnosis not present

## 2022-11-07 DIAGNOSIS — F4323 Adjustment disorder with mixed anxiety and depressed mood: Secondary | ICD-10-CM | POA: Diagnosis not present

## 2022-11-15 ENCOUNTER — Encounter: Payer: Self-pay | Admitting: Psychiatry

## 2022-11-15 ENCOUNTER — Ambulatory Visit (INDEPENDENT_AMBULATORY_CARE_PROVIDER_SITE_OTHER): Payer: Medicare PPO | Admitting: Psychiatry

## 2022-11-15 VITALS — BP 159/80 | HR 78 | Temp 98.2°F | Ht 63.0 in | Wt 126.8 lb

## 2022-11-15 DIAGNOSIS — F3342 Major depressive disorder, recurrent, in full remission: Secondary | ICD-10-CM | POA: Diagnosis not present

## 2022-11-15 DIAGNOSIS — F411 Generalized anxiety disorder: Secondary | ICD-10-CM | POA: Diagnosis not present

## 2022-11-15 MED ORDER — MIRTAZAPINE 15 MG PO TABS: 22.5000 mg | ORAL_TABLET | Freq: Every day | ORAL | 0 refills | Status: DC

## 2022-11-15 MED ORDER — ESCITALOPRAM OXALATE 10 MG PO TABS: 10.0000 mg | ORAL_TABLET | Freq: Every day | ORAL | 0 refills | Status: DC

## 2022-11-15 NOTE — Progress Notes (Unsigned)
[facility] Progress Note  11/15/2022 1:31 PM Angelica Robertson  MRN:  [id number]  Chief Complaint:  Chief Complaint  Patient presents with   Follow-up   Medication Refill   Anxiety   Depression   HPI: Angelica Robertson is a 76 year old Caucasian female, married, retired, lives in [place], has a history of MDD, GAD, hyperlipidemia, arthritis was evaluated in office today.  Patient today reports overall she has been doing fairly well.  Her depression and anxiety symptoms have improved.  She reports she does not have any sadness, low motivation or low appetite as she used to when she was going through her episode of depression.  She has been eating better.  She has been going to the gym and exercising.  She reports she does worry about her husband who is currently going through medical problems however she has been managing okay.  Patient reports sleep is good.  Patient reports she is currently compliant on her medications like Lexapro, mirtazapine.  Denies side effects.  Patient denies any suicidality, homicidality or perceptual disturbances.  Patient reports she has been going to church, social events.  Denies any anxiety attacks when she does that.  Patient denies any other concerns today.  Visit Diagnosis:    ICD-10-CM   1. MDD (major depressive disorder), recurrent, in full remission (Angelica Robertson)  F33.42 escitalopram (LEXAPRO) 10 MG tablet    mirtazapine (REMERON) 15 MG tablet    2. Generalized anxiety disorder  F41.1 mirtazapine (REMERON) 15 MG tablet      Past Psychiatric History: Reviewed past psychiatric history from progress note on 01/12/2020.  Patient attempted TMS-07/17/2022-could not tolerate due to side effects of headaches.  Past Medical History:  Past Medical History:  Diagnosis Date   Depression 2000   Heart murmur    Hypercholesterolemia Osteoporosis   Hyperlipemia     Past Surgical History:  Procedure Laterality Date   ABDOMINAL HYSTERECTOMY  1980   COLONOSCOPY WITH PROPOFOL  N/A 03/05/2018   Procedure: COLONOSCOPY WITH PROPOFOL;  Surgeon: Toledo, [name], MD;  Location: [facility] ENDOSCOPY;  Service: Endoscopy;  Laterality: N/A;    Family Psychiatric History: Reviewed family psychiatric history from progress note on 01/12/2020.  Family History:  Family History  Problem Relation Age of Onset   Heart disease Mother    Diabetes Mother    Heart disease Father    Diabetes Father    Colon cancer Other    Congestive Heart Failure Brother    Colon cancer Brother    Mental illness Paternal Aunt    BRCA 1/2 Neg Hx    Breast cancer Neg Hx    Cowden syndrome Neg Hx    DES usage Neg Hx    Endometrial cancer Neg Hx    Li-Fraumeni syndrome Neg Hx    Ovarian cancer Neg Hx     Social History: Reviewed social history from progress note on 01/12/2020. Social History   Socioeconomic History   Marital status: Married    Spouse name: robert   Number of children: 2   Years of education: Not on file   Highest education level: Not on file  Occupational History   Not on file  Tobacco Use   Smoking status: Never   Smokeless tobacco: Never  Vaping Use   Vaping Use: Never used  Substance and Sexual Activity   Alcohol use: Yes    Alcohol/week: 0.0 standard drinks of alcohol    Comment: occasionally - socially during the summer a glass of  wine or beer   Drug use: No   Sexual activity: Yes  Other Topics Concern   Not on file  Social History Narrative   Not on file   Social Determinants of Health   Financial Resource Strain: Low Risk  (02/27/2018)   Overall Financial Resource Strain (CARDIA)    Difficulty of Paying Living Expenses: Not hard at all  Food Insecurity: No Food Insecurity (02/27/2018)   Hunger Vital Sign    Worried About Running Out of Food in the Last Year: Never true    Ran Out of Food in the Last Year: Never true  Transportation Needs: No Transportation Needs (02/27/2018)   PRAPARE - Hydrologist (Medical): No    Lack of  Transportation (Non-Medical): No  Physical Activity: Sufficiently Active (03/11/2019)   Exercise Vital Sign    Days of Exercise per Week: 5 days    Minutes of Exercise per Session: 30 min  Stress: Stress Concern Present (11/13/2019)   [place]    Feeling of Stress : To some extent  Social Connections: Unknown (11/13/2019)   Social Connection and Isolation Panel [NHANES]    Frequency of Communication with Friends and Family: Not on file    Frequency of Social Gatherings with Friends and Family: Not on file    Attends Religious Services: Not on file    Active Member of Clubs or Organizations: Not on file    Attends Archivist Meetings: Not on file    Marital Status: Married    Allergies: No Known Allergies  Metabolic Disorder Labs: Lab Results  Component Value Date   HGBA1C 6.6 (H) 07/19/2022   No results found for: "PROLACTIN" Lab Results  Component Value Date   CHOL 135 07/19/2022   TRIG 75.0 07/19/2022   HDL 69.90 07/19/2022   CHOLHDL 2 07/19/2022   VLDL 15.0 07/19/2022   LDLCALC 50 07/19/2022   LDLCALC 67 03/21/2022   Lab Results  Component Value Date   TSH 1.20 09/22/2022   TSH 4.46 03/21/2022    Therapeutic Level Labs: No results found for: "LITHIUM" No results found for: "VALPROATE" No results found for: "CBMZ"  Current Medications: Current Outpatient Medications  Medication Sig Dispense Refill   ALPRAZolam (XANAX) 0.25 MG tablet Take 1 tablet (0.25 mg total) by mouth as directed. Take daily as needed for severe anxiety attacks ,please limit use 15 tablet 0   amLODipine (NORVASC) 2.5 MG tablet Take 1 tablet (2.5 mg total) by mouth daily. In am 90 tablet 3   calcium carbonate (OS-CAL) 600 MG TABS tablet Take 600 mg by mouth 2 (two) times daily with a meal.     losartan (COZAAR) 25 MG tablet Take 1 tablet (25 mg total) by mouth daily. 90 tablet 1   pantoprazole (PROTONIX) 40 MG tablet  Take 1 tablet (40 mg total) by mouth daily. Take 30 minutes before your evening meal 30 tablet 2   rosuvastatin (CRESTOR) 20 MG tablet Take 1 tablet (20 mg total) by mouth at bedtime. 90 tablet 3   VITAMIN D PO Take by mouth daily.     escitalopram (LEXAPRO) 10 MG tablet Take 1 tablet (10 mg total) by mouth daily with breakfast. 90 tablet 0   fluorouracil (EFUDEX) 5 % cream Apply 1 Application topically 2 (two) times daily. (Patient not taking: Reported on 11/15/2022)     mirtazapine (REMERON) 15 MG tablet Take 1.5 tablets (22.5 mg total)  by mouth at bedtime. 135 tablet 0   No current facility-administered medications for this visit.     Musculoskeletal: Strength & Muscle Tone: within normal limits Gait & Station: normal Patient leans: N/A  Psychiatric Specialty Exam: Review of Systems  Psychiatric/Behavioral:  The patient is nervous/anxious.   All other systems reviewed and are negative.   Blood pressure (!) 159/80, pulse 78, temperature 98.2 F (36.8 C), temperature source Oral, height _0  (1.6 m), weight 126 lb 12.8 oz (57.5 kg), last menstrual period 11/27/1979.Body mass index is 22.46 kg/m.  General Appearance: Casual  Eye Contact:  Fair  Speech:  Clear and Coherent  Volume:  Normal  Mood:  Anxious  Affect:  Appropriate  Thought Process:  Goal Directed and Descriptions of Associations: Intact  Orientation:  Full (Time, Place, and Person)  Thought Content: Logical   Suicidal Thoughts:  No  Homicidal Thoughts:  No  Memory:  Immediate;   Fair Recent;   Fair Remote;   Fair  Judgement:  Fair  Insight:  Fair  Psychomotor Activity:  Normal  Concentration:  Concentration: Fair and Attention Span: Fair  Recall:  [employer] of Knowledge: Fair  Language: Fair  Akathisia:  No  Handed:  Right  AIMS (if indicated): not done  Assets:  Communication Skills Des[place] for [place] Talents/Skills Transportation  ADL's:  Intact  Cognition: WNL  Sleep:   Fair   Screenings: [place] Office Visit from 06/20/2022 in [place] Office Visit from 04/24/2022 in [place] Total Score 0 0      AUDIT    Flowsheet Row Admission (Discharged) from 05/12/2022 in [place] Admission (Discharged) from 12/17/2019 in [place] Admission (Discharged) from OP Visit from 12/09/2012 in [facility]  Alcohol Use Disorder Identification Test Final Score (AUDIT) 0 0 0      GAD-7    Flowsheet Row Office Visit from 11/15/2022 in [facility] Office Visit from 09/20/2022 in [place] Office Visit from 08/22/2022 in [place] Office Visit from 08/18/2022 in [place] Office Visit from 07/10/2022 in [place]  Total GAD-7 Score _1 Mini-Mental    Flowsheet Row Clinical Support from 02/27/2018 in [place] [facility] from 02/02/2017 in [place] from 02/03/2016 in [facility]  Total Score (max 30 points ) _2 PHQ2-9    [place] Visit from 11/15/2022 in [facility] Visit from 10/26/2022 in [facility] Office Visit from 09/22/2022 in [facility] Office Visit from 09/20/2022 in [place] Visit from 08/22/2022 in [place]  PHQ-2 Total Score 0 0 _3 PHQ-9 Total Score 0 0 _4 [place] Office Visit from 11/15/2022 in [facility] Office Visit from 09/20/2022 in [place] Office Visit from 08/22/2022 in [place] No Risk No Risk No Risk        Assessment and Plan: ARNECIA ECTOR is a 76 year old Caucasian female, married, retired, lives in [place], has a history of MDD, anxiety, hyperlipidemia was evaluated in office today.  Patient  is currently improving, plan as noted below.  Plan  MDD in remission Mirtazapine 22.5 mg p.o. nightly Lexapro as prescribed Continue CBT with [name] Simms.   GAD-improving Mirtazapine 22.5 mg p.o. nightly Lexapro 10 mg p.o. daily.  Patient with elevated blood pressure reading in session, blood pressure repeated, advised to follow up with primary care provider if she continues to have blood pressure elevation.  Follow-up in clinic in 2 to 3 months or sooner if needed.  This note was generated in part or whole with voice recognition software. Voice recognition is usually quite accurate but there are transcription errors that can and very often do occur. I apologize for any typographical errors that were not detected and corrected.     Ursula Alert, MD 11/16/2022, 5:41 PM

## 2022-11-21 DIAGNOSIS — H2513 Age-related nuclear cataract, bilateral: Secondary | ICD-10-CM | POA: Diagnosis not present

## 2022-11-27 DIAGNOSIS — F4323 Adjustment disorder with mixed anxiety and depressed mood: Secondary | ICD-10-CM | POA: Diagnosis not present

## 2022-11-27 DIAGNOSIS — F3341 Major depressive disorder, recurrent, in partial remission: Secondary | ICD-10-CM | POA: Diagnosis not present

## 2022-12-06 ENCOUNTER — Ambulatory Visit (INDEPENDENT_AMBULATORY_CARE_PROVIDER_SITE_OTHER): Payer: Medicare PPO

## 2022-12-06 DIAGNOSIS — E538 Deficiency of other specified B group vitamins: Secondary | ICD-10-CM

## 2022-12-06 MED ORDER — CYANOCOBALAMIN 1000 MCG/ML IJ SOLN
1000.0000 ug | Freq: Once | INTRAMUSCULAR | Status: AC
  Administered 2022-12-06: 1000 ug via INTRAMUSCULAR

## 2022-12-06 NOTE — Progress Notes (Signed)
Patient presented for B 12 injection to left deltoid, patient voiced no concerns nor showed any signs of distress during injection. 

## 2022-12-18 DIAGNOSIS — F3341 Major depressive disorder, recurrent, in partial remission: Secondary | ICD-10-CM | POA: Diagnosis not present

## 2022-12-18 DIAGNOSIS — F4323 Adjustment disorder with mixed anxiety and depressed mood: Secondary | ICD-10-CM | POA: Diagnosis not present

## 2022-12-26 ENCOUNTER — Other Ambulatory Visit (INDEPENDENT_AMBULATORY_CARE_PROVIDER_SITE_OTHER): Payer: Medicare PPO

## 2022-12-26 DIAGNOSIS — R739 Hyperglycemia, unspecified: Secondary | ICD-10-CM

## 2022-12-26 DIAGNOSIS — I10 Essential (primary) hypertension: Secondary | ICD-10-CM

## 2022-12-26 DIAGNOSIS — E7800 Pure hypercholesterolemia, unspecified: Secondary | ICD-10-CM

## 2022-12-26 LAB — BASIC METABOLIC PANEL
BUN: 19 mg/dL (ref 6–23)
CO2: 30 mEq/L (ref 19–32)
Calcium: 9.8 mg/dL (ref 8.4–10.5)
Chloride: 103 mEq/L (ref 96–112)
Creatinine, Ser: 0.85 mg/dL (ref 0.40–1.20)
GFR: 67.51 mL/min (ref >60.00)
Glucose, Bld: 104 mg/dL — ABNORMAL HIGH (ref 70–99)
Potassium: 4.4 mEq/L (ref 3.5–5.1)
Sodium: 140 mEq/L (ref 135–145)

## 2022-12-26 LAB — HEPATIC FUNCTION PANEL
ALT: 19 U/L (ref 0–35)
AST: 23 U/L (ref 0–37)
Albumin: 4.4 g/dL (ref 3.5–5.2)
Alkaline Phosphatase: 56 U/L (ref 39–117)
Bilirubin, Direct: 0.1 mg/dL (ref 0.0–0.3)
Total Bilirubin: 0.5 mg/dL (ref 0.2–1.2)
Total Protein: 6.3 g/dL (ref 6.0–8.3)

## 2022-12-26 LAB — HEMOGLOBIN A1C: Hgb A1c MFr Bld: 6.2 % (ref 4.6–6.5)

## 2022-12-26 LAB — LIPID PANEL
Cholesterol: 142 mg/dL (ref 0–200)
HDL: 62 mg/dL (ref >39.00)
LDL Cholesterol: 67 mg/dL (ref 0–99)
NonHDL: 79.92
Total CHOL/HDL Ratio: 2
Triglycerides: 67 mg/dL (ref 0.0–149.0)
VLDL: 13.4 mg/dL (ref 0.0–40.0)

## 2022-12-28 ENCOUNTER — Encounter: Payer: Self-pay | Admitting: Internal Medicine

## 2022-12-28 ENCOUNTER — Ambulatory Visit: Payer: Medicare PPO | Admitting: Internal Medicine

## 2022-12-28 VITALS — BP 126/80 | HR 70 | Temp 97.9°F | Ht 63.0 in | Wt 127.4 lb

## 2022-12-28 DIAGNOSIS — F3341 Major depressive disorder, recurrent, in partial remission: Secondary | ICD-10-CM

## 2022-12-28 DIAGNOSIS — I10 Essential (primary) hypertension: Secondary | ICD-10-CM

## 2022-12-28 DIAGNOSIS — K219 Gastro-esophageal reflux disease without esophagitis: Secondary | ICD-10-CM | POA: Diagnosis not present

## 2022-12-28 DIAGNOSIS — M542 Cervicalgia: Secondary | ICD-10-CM | POA: Diagnosis not present

## 2022-12-28 DIAGNOSIS — E7800 Pure hypercholesterolemia, unspecified: Secondary | ICD-10-CM | POA: Diagnosis not present

## 2022-12-28 DIAGNOSIS — L659 Nonscarring hair loss, unspecified: Secondary | ICD-10-CM | POA: Diagnosis not present

## 2022-12-28 DIAGNOSIS — K635 Polyp of colon: Secondary | ICD-10-CM

## 2022-12-28 DIAGNOSIS — E1165 Type 2 diabetes mellitus with hyperglycemia: Secondary | ICD-10-CM | POA: Diagnosis not present

## 2022-12-28 DIAGNOSIS — R739 Hyperglycemia, unspecified: Secondary | ICD-10-CM

## 2022-12-28 LAB — HM DIABETES FOOT EXAM

## 2022-12-28 MED ORDER — PANTOPRAZOLE SODIUM 20 MG PO TBEC: 20.0000 mg | DELAYED_RELEASE_TABLET | Freq: Every day | ORAL | 1 refills | Status: DC

## 2022-12-28 NOTE — Progress Notes (Signed)
Subjective:    Patient ID: Angelica Robertson, female    DOB: 29-Aug-1946, 77 y.o.   MRN: [id number]  Patient here for  Chief Complaint  Patient presents with   Medical Management of Chronic Issues    HPI Here to follow up regarding hypercholesterolemia, blood pressure and depression.  She is doing well.  Feels good.  Staying active.  Exercising.  No chest pain or sob.  No cough or congestion.  No acid reflux. On protonix in the evening.  No symptoms.  Discussed decreasing dose.  No abdominal pain or bowel change. Doing well with her depression.  Feels good.    Past Medical History:  Diagnosis Date   Depression 2000   Heart murmur    Hypercholesterolemia Osteoporosis   Hyperlipemia    Past Surgical History:  Procedure Laterality Date   ABDOMINAL HYSTERECTOMY  1980   COLONOSCOPY WITH PROPOFOL N/A 03/05/2018   Procedure: COLONOSCOPY WITH PROPOFOL;  Surgeon: Toledo, [name], MD;  Location: [facility] ENDOSCOPY;  Service: Endoscopy;  Laterality: N/A;   Family History  Problem Relation Age of Onset   Heart disease Mother    Diabetes Mother    Heart disease Father    Diabetes Father    Colon cancer Other    Congestive Heart Failure Brother    Colon cancer Brother    Mental illness Paternal Aunt    BRCA 1/2 Neg Hx    Breast cancer Neg Hx    Cowden syndrome Neg Hx    DES usage Neg Hx    Endometrial cancer Neg Hx    Li-Fraumeni syndrome Neg Hx    Ovarian cancer Neg Hx    Social History   Socioeconomic History   Marital status: Married    Spouse name: robert   Number of children: 2   Years of education: Not on file   Highest education level: Not on file  Occupational History   Not on file  Tobacco Use   Smoking status: Never   Smokeless tobacco: Never  Vaping Use   Vaping Use: Never used  Substance and Sexual Activity   Alcohol use: Yes    Alcohol/week: 0.0 standard drinks of alcohol    Comment: occasionally - socially during the summer a glass of wine or beer   Drug use:  No   Sexual activity: Yes  Other Topics Concern   Not on file  Social History Narrative   Not on file   Social Determinants of Health   Financial Resource Strain: Low Risk  (02/27/2018)   Overall Financial Resource Strain (CARDIA)    Difficulty of Paying Living Expenses: Not hard at all  Food Insecurity: No Food Insecurity (02/27/2018)   Hunger Vital Sign    Worried About Running Out of Food in the Last Year: Never true    [place] in the Last Year: Never true  Transportation Needs: No Transportation Needs (02/27/2018)   PRAPARE - Hydrologist (Medical): No    Lack of Transportation (Non-Medical): No  Physical Activity: Sufficiently Active (03/11/2019)   Exercise Vital Sign    Days of Exercise per Week: 5 days    Minutes of Exercise per Session: 30 min  Stress: Stress Concern Present (11/13/2019)   [place]    Feeling of Stress : To some extent  Social Connections: Unknown (11/13/2019)   Social Connection and Isolation Panel [NHANES]    Frequency of Communication  with Friends and Family: Not on file    Frequency of Social Gatherings with Friends and Family: Not on file    Attends Religious Services: Not on file    Active Member of Clubs or Organizations: Not on file    Attends Archivist Meetings: Not on file    Marital Status: Married     Review of Systems  Constitutional:  Negative for appetite change and unexpected weight change.  HENT:  Negative for congestion and sinus pressure.   Respiratory:  Negative for cough, chest tightness and shortness of breath.   Cardiovascular:  Negative for chest pain, palpitations and leg swelling.  Gastrointestinal:  Negative for abdominal pain, diarrhea, nausea and vomiting.  Genitourinary:  Negative for difficulty urinating and dysuria.  Musculoskeletal:  Negative for joint swelling and myalgias.  Skin:  Negative for color  change and rash.  Neurological:  Negative for dizziness, light-headedness and headaches.  Psychiatric/Behavioral:  Negative for agitation and dysphoric mood.        Objective:     BP 126/80   Pulse 70   Temp 97.9 F (36.6 C)   Ht 5\' 3"  (1.6 m)   Wt 127 lb 6.4 oz (57.8 kg)   LMP 11/27/1979   SpO2 97%   BMI 22.57 kg/m  Wt Readings from Last 3 Encounters:  12/31/22 127 lb 6.4 oz (57.8 kg)  10/26/22 125 lb 14.4 oz (57.1 kg)  09/22/22 128 lb 12.8 oz (58.4 kg)    Physical Exam Vitals reviewed.  Constitutional:      General: She is not in acute distress.    Appearance: Normal appearance.  HENT:     Head: Normocephalic and atraumatic.     Right Ear: External ear normal.     Left Ear: External ear normal.  Eyes:     General: No scleral icterus.       Right eye: No discharge.        Left eye: No discharge.     Conjunctiva/sclera: Conjunctivae normal.  Neck:     Thyroid: No thyromegaly.  Cardiovascular:     Rate and Rhythm: Normal rate and regular rhythm.  Pulmonary:     Effort: No respiratory distress.     Breath sounds: Normal breath sounds. No wheezing.  Abdominal:     General: Bowel sounds are normal.     Palpations: Abdomen is soft.     Tenderness: There is no abdominal tenderness.  Musculoskeletal:        General: No swelling or tenderness.     Cervical back: Neck supple. No tenderness.  Lymphadenopathy:     Cervical: No cervical adenopathy.  Skin:    Findings: No erythema or rash.  Neurological:     Mental Status: She is alert.  Psychiatric:        Mood and Affect: Mood normal.        Behavior: Behavior normal.      Outpatient Encounter Medications as of 12/28/2022  Medication Sig   pantoprazole (PROTONIX) 20 MG tablet Take 1 tablet (20 mg total) by mouth daily.   ALPRAZolam (XANAX) 0.25 MG tablet Take 1 tablet (0.25 mg total) by mouth as directed. Take daily as needed for severe anxiety attacks ,please limit use   amLODipine (NORVASC) 2.5 MG tablet  Take 1 tablet (2.5 mg total) by mouth daily. In am   calcium carbonate (OS-CAL) 600 MG TABS tablet Take 600 mg by mouth 2 (two) times daily with a meal.   escitalopram (LEXAPRO)  10 MG tablet Take 1 tablet (10 mg total) by mouth daily with breakfast.   losartan (COZAAR) 25 MG tablet Take 1 tablet (25 mg total) by mouth daily.   mirtazapine (REMERON) 15 MG tablet Take 1.5 tablets (22.5 mg total) by mouth at bedtime.   rosuvastatin (CRESTOR) 20 MG tablet Take 1 tablet (20 mg total) by mouth at bedtime.   VITAMIN D PO Take by mouth daily.   [DISCONTINUED] fluorouracil (EFUDEX) 5 % cream Apply 1 Application topically 2 (two) times daily. (Patient not taking: Reported on 11/15/2022)   [DISCONTINUED] pantoprazole (PROTONIX) 40 MG tablet Take 1 tablet (40 mg total) by mouth daily. Take 30 minutes before your evening meal   No facility-administered encounter medications on file as of 12/28/2022.     Lab Results  Component Value Date   WBC 9.8 09/22/2022   HGB 13.0 09/22/2022   HCT 37.6 09/22/2022   PLT 340 09/22/2022   GLUCOSE 104 (H) 12/26/2022   CHOL 142 12/26/2022   TRIG 67.0 12/26/2022   HDL 62.00 12/26/2022   LDLCALC 67 12/26/2022   ALT 19 12/26/2022   AST 23 12/26/2022   NA 140 12/26/2022   K 4.4 12/26/2022   CL 103 12/26/2022   CREATININE 0.85 12/26/2022   BUN 19 12/26/2022   CO2 30 12/26/2022   TSH 1.20 09/22/2022   HGBA1C 6.2 12/26/2022    US Abdomen Complete  Result Date: 09/27/2022 CLINICAL DATA:  Nausea weight loss EXAM: ABDOMEN ULTRASOUND COMPLETE COMPARISON:  None Available. FINDINGS: Gallbladder: No gallstones or wall thickening visualized. No sonographic Murphy sign noted by sonographer. Common bile duct: Diameter: 3 mm Liver: Appears slightly echogenic. Hepatic cysts. Exophytic left hepatic cyst measuring 5 x 5.9 x 6.6 cm. Portal vein is patent on color Doppler imaging with normal direction of blood flow towards the liver. IVC: No abnormality visualized. Pancreas:  Visualized portion unremarkable. Spleen: Size and appearance within normal limits. Right Kidney: Length: 11.1 cm. Echogenicity within normal limits. No mass or hydronephrosis visualized. Left Kidney: Length: 11.3 cm. Echogenicity within normal limits. No mass or hydronephrosis visualized. Abdominal aorta: No aneurysm visualized. Other findings: None. IMPRESSION: 1. Liver slightly echogenic suggesting hepatic steatosis 2. Benign appearing liver cysts for which no specific imaging follow-up is recommended 3. Otherwise negative examination Electronically Signed   By: Jasmine Pang M.D.   On: 09/27/2022 23:14       Assessment & Plan:  Hypercholesterolemia Assessment & Plan: On crestor.  Follow lipid panel and liver function tests.    Orders: -     Lipid panel; Future -     Hepatic function panel; Future  Hypertension, unspecified type Assessment & Plan: Continue losartan and amlodipine. Pressures as outlined.  Reviewed outside readings;  111-120s/60-70s.  No change in medication.  Follow pressures.  Follow metabolic panel.   Orders: -     Basic metabolic panel; Future  Hyperglycemia -     Hemoglobin A1c; Future  Gastroesophageal reflux disease, unspecified whether esophagitis present Assessment & Plan: On protonix 40mg  q day.  Decrease to 20mg  q day.  Follow.  If continues to do well, will taper off.     Cervicalgia Assessment & Plan: Neck - better.  Follow.    Polyp of colon, unspecified part of colon, unspecified type Assessment & Plan: Colonoscopy 2019 - normal.  Non bleeding internal hemorrhoids.    Hair loss Assessment & Plan: Reported previously increased hair loss.  Discussed the increased stress could be contributing.  Labs ok.  May be improving some.  Follow.     MDD (major depressive disorder), recurrent, in partial remission (HCC) Assessment & Plan: Being followed by psychiatry.  Doing well on current regimen.  Follow.    Type 2 diabetes mellitus with  hyperglycemia, without long-term current use of insulin (HCC) Assessment & Plan: Follow met b and a1c. Back exercising.    Other orders -     Pantoprazole Sodium; Take 1 tablet (20 mg total) by mouth daily.  Dispense: 90 tablet; Refill: 1     [place], MD

## 2022-12-31 NOTE — Assessment & Plan Note (Signed)
Follow met b and a1c. Back exercising.

## 2022-12-31 NOTE — Assessment & Plan Note (Signed)
Colonoscopy 2019 - normal.  Non bleeding internal hemorrhoids.

## 2022-12-31 NOTE — Assessment & Plan Note (Signed)
Neck - better.  Follow.

## 2022-12-31 NOTE — Assessment & Plan Note (Signed)
Reported previously increased hair loss.  Discussed the increased stress could be contributing.  Labs ok.  May be improving some.  Follow.

## 2022-12-31 NOTE — Assessment & Plan Note (Signed)
Being followed by psychiatry.  Doing well on current regimen.  Follow.

## 2022-12-31 NOTE — Assessment & Plan Note (Signed)
Continue losartan and amlodipine. Pressures as outlined.  Reviewed outside readings;  111-120s/60-70s.  No change in medication.  Follow pressures.  Follow metabolic panel.

## 2022-12-31 NOTE — Assessment & Plan Note (Signed)
On protonix 40mg  q day.  Decrease to 20mg  q day.  Follow.  If continues to do well, will taper off.

## 2022-12-31 NOTE — Assessment & Plan Note (Signed)
On crestor.  Follow lipid panel and liver function tests.   

## 2023-01-08 ENCOUNTER — Ambulatory Visit (INDEPENDENT_AMBULATORY_CARE_PROVIDER_SITE_OTHER): Payer: Medicare PPO

## 2023-01-08 DIAGNOSIS — E538 Deficiency of other specified B group vitamins: Secondary | ICD-10-CM | POA: Diagnosis not present

## 2023-01-08 MED ORDER — CYANOCOBALAMIN 1000 MCG/ML IJ SOLN
1000.0000 ug | Freq: Once | INTRAMUSCULAR | Status: AC
  Administered 2023-01-08: 1000 ug via INTRAMUSCULAR

## 2023-01-08 NOTE — Progress Notes (Signed)
Pt arrived at office for B12 injection.  Pt received B12 injection in L Deltoid without issue. Pt did not show any signs of distress or complication.  Pt released without restriction.

## 2023-01-15 DIAGNOSIS — F4323 Adjustment disorder with mixed anxiety and depressed mood: Secondary | ICD-10-CM | POA: Diagnosis not present

## 2023-01-15 DIAGNOSIS — F3341 Major depressive disorder, recurrent, in partial remission: Secondary | ICD-10-CM | POA: Diagnosis not present

## 2023-01-23 DIAGNOSIS — Z01818 Encounter for other preprocedural examination: Secondary | ICD-10-CM | POA: Diagnosis not present

## 2023-01-23 DIAGNOSIS — Z86010 Personal history of colonic polyps: Secondary | ICD-10-CM | POA: Diagnosis not present

## 2023-02-01 ENCOUNTER — Encounter: Payer: Self-pay | Admitting: Psychiatry

## 2023-02-01 ENCOUNTER — Telehealth (INDEPENDENT_AMBULATORY_CARE_PROVIDER_SITE_OTHER): Payer: Medicare PPO | Admitting: Psychiatry

## 2023-02-01 ENCOUNTER — Telehealth: Payer: Self-pay | Admitting: Internal Medicine

## 2023-02-01 DIAGNOSIS — F3342 Major depressive disorder, recurrent, in full remission: Secondary | ICD-10-CM

## 2023-02-01 DIAGNOSIS — F411 Generalized anxiety disorder: Secondary | ICD-10-CM

## 2023-02-01 MED ORDER — ESCITALOPRAM OXALATE 10 MG PO TABS: 10.0000 mg | ORAL_TABLET | Freq: Every day | ORAL | 0 refills | Status: DC

## 2023-02-01 MED ORDER — MIRTAZAPINE 15 MG PO TABS: 22.5000 mg | ORAL_TABLET | Freq: Every day | ORAL | 0 refills | Status: DC

## 2023-02-01 NOTE — Telephone Encounter (Signed)
Contacted Angelica Robertson to schedule their annual wellness visit. Appointment made for 02/12/2023.  Thank you,  [place] Direct dial  [phone]

## 2023-02-01 NOTE — Progress Notes (Signed)
Virtual Visit via Video Note  I connected with Angelica Robertson on 02/01/23 at  1:30 PM EST by a video enabled telemedicine application and verified that I am speaking with the correct person using two identifiers.  Location Provider Location : [facility] Patient Location : Home  Participants: Patient , Provider   I discussed the limitations of evaluation and management by telemedicine and the availability of in person appointments. The patient expressed understanding and agreed to proceed.   I discussed the assessment and treatment plan with the patient. The patient was provided an opportunity to ask questions and all were answered. The patient agreed with the plan and demonstrated an understanding of the instructions.   The patient was advised to call back or seek an in-person evaluation if the symptoms worsen or if the condition fails to improve as anticipated.   [place] MD OP Progress Note  02/02/2023 7:39 AM Angelica Robertson  MRN:  [id number]  Chief Complaint:  Chief Complaint  Patient presents with   Follow-up   Anxiety   Medication Refill   Depression   HPI: Angelica Robertson is a 77 year old Caucasian female, married, retired, lives in [place], has a history of MDD, GAD, hyperlipidemia, arthritis was evaluated by telemedicine today.  Patient today reports she is currently doing well.  Denies depression or anxiety symptoms.  Reports she has been going to the gym on a regular basis.  She also has been taking care of her son's dog, walking her and that has been therapeutic.  She reports she has been going to Bible study groups, church as well as reading books again.  She looks forward to taking a vacation to [place] in April.  Reports appetite as fair.  May have gained a few pounds.  Reports sleep is good.  Currently compliant on the mirtazapine and Lexapro.  Denies side effects.  Patient denies any suicidality, homicidality or perceptual disturbances.  Patient denies any other concerns  today.  Visit Diagnosis:    ICD-10-CM   1. MDD (major depressive disorder), recurrent, in full remission ([place])  F33.42 mirtazapine (REMERON) 15 MG tablet    escitalopram (LEXAPRO) 10 MG tablet    2. Generalized anxiety disorder  F41.1 mirtazapine (REMERON) 15 MG tablet      Past Psychiatric History: Reviewed past psych history from progress note on 01/12/2020.  Patient attempted TMS-07/17/2022-could not tolerate due to side effects of headaches.  Past Medical History:  Past Medical History:  Diagnosis Date   Depression 2000   Heart murmur    Hypercholesterolemia Osteoporosis   Hyperlipemia     Past Surgical History:  Procedure Laterality Date   ABDOMINAL HYSTERECTOMY  1980   COLONOSCOPY WITH PROPOFOL N/A 03/05/2018   Procedure: COLONOSCOPY WITH PROPOFOL;  Surgeon: Toledo, [name], MD;  Location: [facility] ENDOSCOPY;  Service: Endoscopy;  Laterality: N/A;    Family Psychiatric History: Reviewed family psychiatric history from progress note on 01/12/2020.  Family History:  Family History  Problem Relation Age of Onset   Heart disease Mother    Diabetes Mother    Heart disease Father    Diabetes Father    Colon cancer Other    Congestive Heart Failure Brother    Colon cancer Brother    Mental illness Paternal Aunt    BRCA 1/2 Neg Hx    Breast cancer Neg Hx    Cowden syndrome Neg Hx    DES usage Neg Hx    Endometrial cancer Neg Hx    Li-Fraumeni  syndrome Neg Hx    Ovarian cancer Neg Hx     Social History: Reviewed social history from progress note on 01/12/2020. Social History   Socioeconomic History   Marital status: Married    Spouse name: robert   Number of children: 2   Years of education: Not on file   Highest education level: Not on file  Occupational History   Not on file  Tobacco Use   Smoking status: Never   Smokeless tobacco: Never  Vaping Use   Vaping Use: Never used  Substance and Sexual Activity   Alcohol use: Yes    Alcohol/week: 0.0 standard drinks  of alcohol    Comment: occasionally - socially during the summer a glass of wine or beer   Drug use: No   Sexual activity: Yes  Other Topics Concern   Not on file  Social History Narrative   Not on file   Social Determinants of Health   Financial Resource Strain: Low Risk  (02/27/2018)   Overall Financial Resource Strain (CARDIA)    Difficulty of Paying Living Expenses: Not hard at all  Food Insecurity: No Food Insecurity (02/27/2018)   Hunger Vital Sign    Worried About Running Out of Food in the Last Year: Never true    [place] in the Last Year: Never true  Transportation Needs: No Transportation Needs (02/27/2018)   PRAPARE - Hydrologist (Medical): No    Lack of Transportation (Non-Medical): No  Physical Activity: Sufficiently Active (03/11/2019)   Exercise Vital Sign    Days of Exercise per Week: 5 days    Minutes of Exercise per Session: 30 min  Stress: Stress Concern Present (11/13/2019)   [place]    Feeling of Stress : To some extent  Social Connections: Unknown (11/13/2019)   Social Connection and Isolation Panel [NHANES]    Frequency of Communication with Friends and Family: Not on file    Frequency of Social Gatherings with Friends and Family: Not on file    Attends Religious Services: Not on file    Active Member of Clubs or Organizations: Not on file    Attends Archivist Meetings: Not on file    Marital Status: Married    Allergies: No Known Allergies  Metabolic Disorder Labs: Lab Results  Component Value Date   HGBA1C 6.2 12/26/2022   No results found for: "PROLACTIN" Lab Results  Component Value Date   CHOL 142 12/26/2022   TRIG 67.0 12/26/2022   HDL 62.00 12/26/2022   CHOLHDL 2 12/26/2022   VLDL 13.4 12/26/2022   LDLCALC 67 12/26/2022   LDLCALC 50 07/19/2022   Lab Results  Component Value Date   TSH 1.20 09/22/2022   TSH 4.46  03/21/2022    Therapeutic Level Labs: No results found for: "LITHIUM" No results found for: "VALPROATE" No results found for: "CBMZ"  Current Medications: Current Outpatient Medications  Medication Sig Dispense Refill   amLODipine (NORVASC) 2.5 MG tablet Take 1 tablet (2.5 mg total) by mouth daily. In am 90 tablet 3   calcium carbonate (OS-CAL) 600 MG TABS tablet Take 600 mg by mouth 2 (two) times daily with a meal.     losartan (COZAAR) 25 MG tablet Take 1 tablet (25 mg total) by mouth daily. 90 tablet 1   pantoprazole (PROTONIX) 20 MG tablet Take 1 tablet (20 mg total) by mouth daily. 90 tablet 1  rosuvastatin (CRESTOR) 20 MG tablet Take 1 tablet (20 mg total) by mouth at bedtime. 90 tablet 3   VITAMIN D PO Take by mouth daily.     ALPRAZolam (XANAX) 0.25 MG tablet Take 1 tablet (0.25 mg total) by mouth as directed. Take daily as needed for severe anxiety attacks ,please limit use (Patient not taking: Reported on 02/01/2023) 15 tablet 0   escitalopram (LEXAPRO) 10 MG tablet Take 1 tablet (10 mg total) by mouth daily with breakfast. 90 tablet 0   mirtazapine (REMERON) 15 MG tablet Take 1.5 tablets (22.5 mg total) by mouth at bedtime. 135 tablet 0   No current facility-administered medications for this visit.     Musculoskeletal: Strength & Muscle Tone:  UTA Gait & Station:  Seated Patient leans: N/A  Psychiatric Specialty Exam: Review of Systems  Psychiatric/Behavioral: Negative.    All other systems reviewed and are negative.   Last menstrual period 11/27/1979.There is no height or weight on file to calculate BMI.  General Appearance: Casual  Eye Contact:  Fair  Speech:  Clear and Coherent  Volume:  Normal  Mood:  Euthymic  Affect:  Congruent  Thought Process:  Goal Directed and Descriptions of Associations: Intact  Orientation:  Full (Time, Place, and Person)  Thought Content: Logical   Suicidal Thoughts:  No  Homicidal Thoughts:  No  Memory:  Immediate;    Fair Recent;   Fair Remote;   Fair  Judgement:  Fair  Insight:  Fair  Psychomotor Activity:  Normal  Concentration:  Concentration: Fair and Attention Span: Fair  Recall:  [employer] of Knowledge: Fair  Language: Fair  Akathisia:  No  Handed:  Right  AIMS (if indicated): not done  Assets:  Communication Skills Desire for Improvement Housing Social Support  ADL's:  Intact  Cognition: WNL  Sleep:  Fair   Screenings: [place] Office Visit from 06/20/2022 in [place] Office Visit from 04/24/2022 in [facility] Total Score 0 0      AUDIT    Flowsheet Row Admission (Discharged) from 05/12/2022 in [facility] Admission (Discharged) from 12/17/2019 in [facility] Admission (Discharged) from OP Visit from 12/09/2012 in [facility]  Alcohol Use Disorder Identification Test Final Score (AUDIT) 0 0 0      GAD-7    Flowsheet Row Office Visit from 11/15/2022 in [place] Office Visit from 09/20/2022 in [facility] [place] Office Visit from 08/22/2022 in [facility] Office Visit from 08/18/2022 in [place] at [facility] Erroneous Encounter from 07/10/2022 in [facility]  Total GAD-7 Score '1 6 20 7 18      '$ [place] from 02/27/2018 in [place] [facility] from 02/02/2017 in [place] [facility] from 02/03/2016 in [place] at [facility]  Total Score (max 30 points ) '30 30 30      '$ PHQ2-9    Flowsheet Row Video Visit from 02/01/2023 in [facility]  Office Visit from 11/15/2022 in [place] Office Visit from 10/26/2022 in [place] at [facility] Visit from 09/22/2022 in [place] [facility] at [facility] Visit  from 09/20/2022 in [facility]  PHQ-2 Total Score 0 0 0 2 2  PHQ-9 Total Score -- 0 0 8 7      Flowsheet Row Video Visit from 02/01/2023 in [facility] Office Visit from 11/15/2022 in [facility] [place] Office Visit from 09/20/2022 in [facility]  C-SSRS RISK CATEGORY No Risk No Risk No Risk        Assessment and Plan: Angelica Robertson is a 77 year old Caucasian female, married, retired, lives in [place], has a history of MDD, anxiety, hyperlipidemia was evaluated by telemedicine today.  Patient is currently stable.  Plan as noted below.  Plan  MDD in remission Mirtazapine 22.5 mg p.o. nightly Lexapro 10 mg p.o. daily Continue CBT with Ms. Raynelle Jan.  Will coordinate care with therapist.  GAD-stable Mirtazapine 22.5 mg p.o. nightly Lexapro 10 mg p.o. daily  Follow-up in clinic in 3 to 4 months or sooner if needed.   Collaboration of Care: Collaboration of Care: Referral or follow-up with counselor/therapist AEB patient to continue to follow-up with therapist.  Patient/Guardian was advised Release of Information must be obtained prior to any record release in order to collaborate their care with an outside provider. Patient/Guardian was advised if they have not already done so to contact the registration department to sign all necessary forms in order for [place] to release information regarding their care.   Consent: Patient/Guardian gives verbal consent for treatment and assignment of benefits for services provided during this visit. Patient/Guardian expressed understanding  and agreed to proceed.   This note was generated in part or whole with voice recognition software. Voice recognition is usually quite accurate but there are transcription errors that can and very often do occur. I apologize for any typographical errors that were not detected and corrected.      Ursula Alert, MD 02/02/2023, 7:39 AM

## 2023-02-02 MED ORDER — MIRTAZAPINE 15 MG PO TABS: 22.5000 mg | ORAL_TABLET | Freq: Every day | ORAL | 0 refills | Status: DC

## 2023-02-08 ENCOUNTER — Ambulatory Visit (INDEPENDENT_AMBULATORY_CARE_PROVIDER_SITE_OTHER): Payer: Medicare PPO

## 2023-02-08 DIAGNOSIS — E538 Deficiency of other specified B group vitamins: Secondary | ICD-10-CM

## 2023-02-08 MED ORDER — CYANOCOBALAMIN 1000 MCG/ML IJ SOLN
1000.0000 ug | Freq: Once | INTRAMUSCULAR | Status: AC
  Administered 2023-02-08: 1000 ug via INTRAMUSCULAR

## 2023-02-08 NOTE — Progress Notes (Signed)
Patient presented for B 12 injection to left deltoid, patient voiced no concerns nor showed any signs of distress during injection. 

## 2023-02-12 ENCOUNTER — Ambulatory Visit (INDEPENDENT_AMBULATORY_CARE_PROVIDER_SITE_OTHER): Payer: Medicare PPO

## 2023-02-12 VITALS — Ht 63.0 in | Wt 130.0 lb

## 2023-02-12 DIAGNOSIS — F4323 Adjustment disorder with mixed anxiety and depressed mood: Secondary | ICD-10-CM | POA: Diagnosis not present

## 2023-02-12 DIAGNOSIS — Z0000 Encounter for general adult medical examination without abnormal findings: Secondary | ICD-10-CM | POA: Diagnosis not present

## 2023-02-12 DIAGNOSIS — F3341 Major depressive disorder, recurrent, in partial remission: Secondary | ICD-10-CM | POA: Diagnosis not present

## 2023-02-12 NOTE — Patient Instructions (Signed)
Angelica Robertson , Thank you for taking time to come for your Medicare Wellness Visit. I appreciate your ongoing commitment to your health goals. Please review the following plan we discussed and let me know if I can assist you in the future.   These are the goals we discussed:  Goals       Patient Stated     DIET -Snack Healthier (pt-stated)      Increase physical activity (pt-stated)      Resume exercise regimen at [employer] when able. Maintain healthy diet, good water intake        This is a list of the screening recommended for you and due dates:  Health Maintenance  Topic Date Due   Yearly kidney health urinalysis for diabetes  Never done   COVID-19 Vaccine (5 - 2023-24 season) 02/28/2023*   Zoster (Shingles) Vaccine (1 of 2) 03/29/2023*   Mammogram  06/20/2023   Hemoglobin A1C  06/26/2023   Eye exam for diabetics  11/22/2023   Yearly kidney function blood test for diabetes  12/27/2023   Complete foot exam   12/29/2023   Medicare Annual Wellness Visit  02/12/2024   DTaP/Tdap/Td vaccine (2 - Td or Tdap) 02/28/2026   Colon Cancer Screening  03/05/2028   Pneumonia Vaccine  Completed   Flu Shot  Completed   DEXA scan (bone density measurement)  Completed   Hepatitis C Screening: USPSTF Recommendation to screen - Ages 43-79 yo.  Completed   HPV Vaccine  Aged Out  *Topic was postponed. The date shown is not the original due date.    Advanced directives: on file  Conditions/risks identified: none new  Next appointment: Follow up in one year for your annual wellness visit    Preventive Care 65 Years and Older, Female Preventive care refers to lifestyle choices and visits with your health care provider that can promote health and wellness. What does preventive care include? A yearly physical exam. This is also called an annual well check. Dental exams once or twice a year. Routine eye exams. Ask your health care provider how often you should have your eyes  checked. Personal lifestyle choices, including: Daily care of your teeth and gums. Regular physical activity. Eating a healthy diet. Avoiding tobacco and drug use. Limiting alcohol use. Practicing safe sex. Taking low-dose aspirin every day. Taking vitamin and mineral supplements as recommended by your health care provider. What happens during an annual well check? The services and screenings done by your health care provider during your annual well check will depend on your age, overall health, lifestyle risk factors, and family history of disease. Counseling  Your health care provider may ask you questions about your: Alcohol use. Tobacco use. Drug use. Emotional well-being. Home and relationship well-being. Sexual activity. Eating habits. History of falls. Memory and ability to understand (cognition). Work and work Statistician. Reproductive health. Screening  You may have the following tests or measurements: Height, weight, and BMI. Blood pressure. Lipid and cholesterol levels. These may be checked every 5 years, or more frequently if you are over 78 years old. Skin check. Lung cancer screening. You may have this screening every year starting at age 73 if you have a 30-pack-year history of smoking and currently smoke or have quit within the past 15 years. Fecal occult blood test (FOBT) of the stool. You may have this test every year starting at age 9. Flexible sigmoidoscopy or colonoscopy. You may have a sigmoidoscopy every 5 years or a colonoscopy every  10 years starting at age 38. Hepatitis C blood test. Hepatitis B blood test. Sexually transmitted disease (STD) testing. Diabetes screening. This is done by checking your blood sugar (glucose) after you have not eaten for a while (fasting). You may have this done every 1-3 years. Bone density scan. This is done to screen for osteoporosis. You may have this done starting at age 85. Mammogram. This may be done every 1-2  years. Talk to your health care provider about how often you should have regular mammograms. Talk with your health care provider about your test results, treatment options, and if necessary, the need for more tests. Vaccines  Your health care provider may recommend certain vaccines, such as: Influenza vaccine. This is recommended every year. Tetanus, diphtheria, and acellular pertussis (Tdap, Td) vaccine. You may need a Td booster every 10 years. Zoster vaccine. You may need this after age 36. Pneumococcal 13-valent conjugate (PCV13) vaccine. One dose is recommended after age 41. Pneumococcal polysaccharide (PPSV23) vaccine. One dose is recommended after age 67. Talk to your health care provider about which screenings and vaccines you need and how often you need them. This information is not intended to replace advice given to you by your health care provider. Make sure you discuss any questions you have with your health care provider. Document Released: 12/24/2015 Document Revised: 08/16/2016 Document Reviewed: 09/28/2015 Elsevier Interactive Patient Education  2017 [place] Prevention in the Home Falls can cause injuries. They can happen to people of all ages. There are many things you can do to make your home safe and to help prevent falls. What can I do on the outside of my home? Regularly fix the edges of walkways and driveways and fix any cracks. Remove anything that might make you trip as you walk through a door, such as a raised step or threshold. Trim any bushes or trees on the path to your home. Use bright outdoor lighting. Clear any walking paths of anything that might make someone trip, such as rocks or tools. Regularly check to see if handrails are loose or broken. Make sure that both sides of any steps have handrails. Any raised decks and porches should have guardrails on the edges. Have any leaves, snow, or ice cleared regularly. Use sand or salt on walking paths  during winter. Clean up any spills in your garage right away. This includes oil or grease spills. What can I do in the bathroom? Use night lights. Install grab bars by the toilet and in the tub and shower. Do not use towel bars as grab bars. Use non-skid mats or decals in the tub or shower. If you need to sit down in the shower, use a plastic, non-slip stool. Keep the floor dry. Clean up any water that spills on the floor as soon as it happens. Remove soap buildup in the tub or shower regularly. Attach bath mats securely with double-sided non-slip rug tape. Do not have throw rugs and other things on the floor that can make you trip. What can I do in the bedroom? Use night lights. Make sure that you have a light by your bed that is easy to reach. Do not use any sheets or blankets that are too big for your bed. They should not hang down onto the floor. Have a firm chair that has side arms. You can use this for support while you get dressed. Do not have throw rugs and other things on the floor that can make you  trip. What can I do in the kitchen? Clean up any spills right away. Avoid walking on wet floors. Keep items that you use a lot in easy-to-reach places. If you need to reach something above you, use a strong step stool that has a grab bar. Keep electrical cords out of the way. Do not use floor polish or wax that makes floors slippery. If you must use wax, use non-skid floor wax. Do not have throw rugs and other things on the floor that can make you trip. What can I do with my stairs? Do not leave any items on the stairs. Make sure that there are handrails on both sides of the stairs and use them. Fix handrails that are broken or loose. Make sure that handrails are as long as the stairways. Check any carpeting to make sure that it is firmly attached to the stairs. Fix any carpet that is loose or worn. Avoid having throw rugs at the top or bottom of the stairs. If you do have throw  rugs, attach them to the floor with carpet tape. Make sure that you have a light switch at the top of the stairs and the bottom of the stairs. If you do not have them, ask someone to add them for you. What else can I do to help prevent falls? Wear shoes that: Do not have high heels. Have rubber bottoms. Are comfortable and fit you well. Are closed at the toe. Do not wear sandals. If you use a stepladder: Make sure that it is fully opened. Do not climb a closed stepladder. Make sure that both sides of the stepladder are locked into place. Ask someone to hold it for you, if possible. Clearly mark and make sure that you can see: Any grab bars or handrails. First and last steps. Where the edge of each step is. Use tools that help you move around (mobility aids) if they are needed. These include: Canes. Walkers. Scooters. Crutches. Turn on the lights when you go into a dark area. Replace any light bulbs as soon as they burn out. Set up your furniture so you have a clear path. Avoid moving your furniture around. If any of your floors are uneven, fix them. If there are any pets around you, be aware of where they are. Review your medicines with your doctor. Some medicines can make you feel dizzy. This can increase your chance of falling. Ask your doctor what other things that you can do to help prevent falls. This information is not intended to replace advice given to you by your health care provider. Make sure you discuss any questions you have with your health care provider. Document Released: 09/23/2009 Document Revised: 05/04/2016 Document Reviewed: 01/01/2015 Elsevier Interactive Patient Education  2017 [employer].

## 2023-02-12 NOTE — Progress Notes (Signed)
Subjective:   Angelica Robertson is a 77 y.o. female who presents for Medicare Annual (Subsequent) preventive examination.  Review of Systems    No ROS.  Medicare Wellness Virtual Visit.  Visual/audio telehealth visit, UTA vital signs.   See social history for additional risk factors.   Cardiac Risk Factors include: advanced age (>26mn, >>49women)     Objective:    Today's Vitals   02/12/23 1534  Weight: 130 lb (59 kg)  Height: '5\' 3"'$  (1.6 m)   Body mass index is 23.03 kg/m.     02/12/2023    3:37 PM 05/12/2022    3:00 PM 05/12/2022    9:25 AM 12/22/2019    9:32 AM 12/17/2019    6:30 PM 12/17/2019   11:25 AM 03/11/2019   10:21 AM  Advanced Directives  Does Patient Have a Medical Advance Directive? Yes  Yes   Yes Yes  Type of AParamedicof [place] will  Living will;Healthcare Power of Attorney   Living will Living will  Does patient want to make changes to medical advance directive? No - Patient declined  No - Patient declined   No - Patient declined No - Patient declined  Copy of HElberonin Chart? Yes - validated most recent copy scanned in chart (See row information)        Would patient like information on creating a medical advance directive?            Information is confidential and restricted. Go to Review Flowsheets to unlock data.    Current Medications (verified) Outpatient Encounter Medications as of 02/12/2023  Medication Sig   ALPRAZolam (XANAX) 0.25 MG tablet Take 1 tablet (0.25 mg total) by mouth as directed. Take daily as needed for severe anxiety attacks ,please limit use (Patient not taking: Reported on 02/01/2023)   amLODipine (NORVASC) 2.5 MG tablet Take 1 tablet (2.5 mg total) by mouth daily. In am   calcium carbonate (OS-CAL) 600 MG TABS tablet Take 600 mg by mouth 2 (two) times daily with a meal.   escitalopram (LEXAPRO) 10 MG tablet Take 1 tablet (10 mg total) by mouth daily with breakfast.   losartan (COZAAR)  25 MG tablet Take 1 tablet (25 mg total) by mouth daily.   mirtazapine (REMERON) 15 MG tablet Take 1.5 tablets (22.5 mg total) by mouth at bedtime.   pantoprazole (PROTONIX) 20 MG tablet Take 1 tablet (20 mg total) by mouth daily.   rosuvastatin (CRESTOR) 20 MG tablet Take 1 tablet (20 mg total) by mouth at bedtime.   VITAMIN D PO Take by mouth daily.   No facility-administered encounter medications on file as of 02/12/2023.    Allergies (verified) Patient has no known allergies.   History: Past Medical History:  Diagnosis Date   Depression 2000   Heart murmur    Hypercholesterolemia Osteoporosis   Hyperlipemia    Past Surgical History:  Procedure Laterality Date   ABDOMINAL HYSTERECTOMY  1980   COLONOSCOPY WITH PROPOFOL N/A 03/05/2018   Procedure: COLONOSCOPY WITH PROPOFOL;  Surgeon: Toledo, TBenay Pike MD;  Location: [facility] ENDOSCOPY;  Service: Endoscopy;  Laterality: N/A;   Family History  Problem Relation Age of Onset   Heart disease Mother    Diabetes Mother    Heart disease Father    Diabetes Father    Colon cancer Other    Congestive Heart Failure Brother    Colon cancer Brother    Mental illness Paternal Aunt  BRCA 1/2 Neg Hx    Breast cancer Neg Hx    Cowden syndrome Neg Hx    DES usage Neg Hx    Endometrial cancer Neg Hx    Li-Fraumeni syndrome Neg Hx    Ovarian cancer Neg Hx    Social History   Socioeconomic History   Marital status: Married    Spouse name: robert   Number of children: 2   Years of education: Not on file   Highest education level: Not on file  Occupational History   Not on file  Tobacco Use   Smoking status: Never   Smokeless tobacco: Never  Vaping Use   Vaping Use: Never used  Substance and Sexual Activity   Alcohol use: Yes    Alcohol/week: 0.0 standard drinks of alcohol    Comment: occasionally - socially during the summer a glass of wine or beer   Drug use: No   Sexual activity: Yes  Other Topics Concern   Not on file   Social History Narrative   Not on file   Social Determinants of Health   Financial Resource Strain: Low Risk  (02/09/2023)   Overall Financial Resource Strain (CARDIA)    Difficulty of Paying Living Expenses: Not hard at all  Food Insecurity: No Food Insecurity (02/09/2023)   Hunger Vital Sign    Worried About Running Out of Food in the Last Year: Never true    Ran Out of Food in the Last Year: Never true  Transportation Needs: No Transportation Needs (02/09/2023)   PRAPARE - Hydrologist (Medical): No    Lack of Transportation (Non-Medical): No  Physical Activity: Insufficiently Active (02/09/2023)   Exercise Vital Sign    Days of Exercise per Week: 3 days    Minutes of Exercise per Session: 30 min  Stress: No Stress Concern Present (02/09/2023)   [place]    Feeling of Stress : Not at all  Social Connections: Unknown (02/09/2023)   Social Connection and Isolation Panel [NHANES]    Frequency of Communication with Friends and Family: Once a week    Frequency of Social Gatherings with Friends and Family: More than three times a week    Attends Religious Services: Not on Advertising copywriter or Organizations: Yes    Attends Music therapist: More than 4 times per year    Marital Status: Married    Tobacco Counseling Counseling given: Not Answered   Clinical Intake:  Pre-visit preparation completed: Yes        Diabetes: No  How often do you need to have someone help you when you read instructions, pamphlets, or other written materials from your doctor or pharmacy?: 1 - Never    Interpreter Needed?: No      Activities of Daily Living    02/09/2023    4:41 PM 05/12/2022    3:00 PM  In your present state of health, do you have any difficulty performing the following activities:  Hearing? 0   Vision? 0   Difficulty concentrating or making decisions? 0    Walking or climbing stairs? 0   Dressing or bathing? 0   Doing errands, shopping? 0   Preparing Food and eating ? N   Using the Toilet? N   In the past six months, have you accidently leaked urine? N   Do you have problems with loss of bowel control? N  Managing your Medications? N   Managing your Finances? N   Housekeeping or managing your Housekeeping? N      Information is confidential and restricted. Go to Review Flowsheets to unlock data.    Patient Care Team: Einar Pheasant, MD as PCP - General (Internal Medicine)  Indicate any recent Medical Services you may have received from other than [facility] providers in the past year (date may be approximate).     Assessment:   This is a routine wellness examination for Angelica Robertson.  I connected with  Angelica Robertson on 02/12/23 by a audio enabled telemedicine application and verified that I am speaking with the correct person using two identifiers.  Patient Location: Home  Provider Location: Office/Clinic  I discussed the limitations of evaluation and management by telemedicine. The patient expressed understanding and agreed to proceed.   Patient Medicare AWV questionnaire was completed by the patient on 02/09/23, I have confirmed that all information answered by patient is correct and no changes since this date.   Hearing/Vision screen Hearing Screening - Comments:: Patient is able to hear conversational tones without difficulty.  No issues reported.   Vision Screening - Comments:: Followed by Dr. Ellin Mayhew Wears corrective lenses   Dietary issues and exercise activities discussed: Current Exercise Habits: Home exercise routine, Type of exercise: strength training/weights;calisthenics;treadmill, Time (Minutes): 30, Frequency (Times/Week): 3, Weekly Exercise (Minutes/Week): 90, Intensity: Mild Healthy diet Good water intake   Goals Addressed               This Visit's Progress     Patient Stated     Increase physical activity  (pt-stated)   On track     Resume exercise regimen at [employer] when able. Maintain healthy diet, good water intake       Depression Screen    02/12/2023    3:37 PM 02/01/2023    1:46 PM 11/15/2022    1:20 PM 10/26/2022   11:21 AM 09/22/2022    2:31 PM 09/20/2022    2:47 PM 08/22/2022   10:59 AM  PHQ 2/9 Scores  PHQ - 2 Score 0   0 2    PHQ- 9 Score    0 8       Information is confidential and restricted. Go to Review Flowsheets to unlock data.    Fall Risk    02/09/2023    4:41 PM 09/22/2022    2:31 PM 08/18/2022    4:54 PM 07/13/2022    5:10 PM 07/13/2022    4:31 PM  Fall Risk   Falls in the past year? 1 1 0 1 1  Number falls in past yr: 0 1 0 0 1  Injury with Fall? 0 0 0 0 0  Risk for fall due to :  Impaired balance/gait;No Fall Risks No Fall Risks  History of fall(s)  Follow up  Falls evaluation completed Falls evaluation completed  Falls evaluation completed    [place]: Home free of loose throw rugs in walkways, pet beds, electrical cords, etc? Yes  Adequate lighting in your home to reduce risk of falls? Yes   ASSISTIVE DEVICES UTILIZED TO PREVENT FALLS: Life alert? No  Use of a cane, walker or w/c? No  Grab bars in the bathroom? No  Shower chair or bench in shower? No  Elevated toilet seat or a handicapped toilet? No   TIMED UP AND GO: Was the test performed? No .   Cognitive Function:  02/27/2018    3:29 PM 02/02/2017    2:35 PM 02/03/2016    1:20 PM  MMSE - Mini Mental State Exam  Orientation to time '5 5 5  '$ Orientation to Place '5 5 5  '$ Registration '3 3 3  '$ Attention/ Calculation '5 5 5  '$ Recall '3 3 3  '$ Language- name 2 objects '2 2 2  '$ Language- repeat '1 1 1  '$ Language- follow 3 step command '3 3 3  '$ Language- read & follow direction '1 1 1  '$ Write a sentence '1 1 1  '$ Copy design '1 1 1  '$ Total score '30 30 30        '$ 02/12/2023    3:38 PM 03/11/2019   10:23 AM  6CIT Screen  What Year? 0 points 0 points  What month? 0  points 0 points  What time? 0 points 0 points  Count back from 20 0 points 0 points  Months in reverse 0 points 0 points  Repeat phrase 0 points 0 points  Total Score 0 points 0 points    Immunizations Immunization History  Administered Date(s) Administered   Fluad Quad(high Dose 65+) 09/02/2019, 09/01/2020, 08/18/2022   Influenza Nasal 09/09/2012   Influenza Split 01/07/2013, 09/15/2013   Influenza, High Dose Seasonal PF 09/25/2017, 10/01/2018   Influenza,inj,Quad PF,6+ Mos 08/13/2014, 08/26/2015   Influenza-Unspecified 10/20/2016, 09/09/2021   Moderna SARS-COV2 Booster Vaccination 03/28/2021   Moderna Sars-Covid-2 Vaccination 01/21/2020, 02/18/2020, 10/21/2020   PFIZER(Purple Top)SARS-COV-2 Vaccination 09/13/2021   Pneumococcal Conjugate-13 11/25/2013   Pneumococcal Polysaccharide-23 02/24/2016   Tdap 02/29/2016   Zoster, Live 02/29/2016   Screening Tests Health Maintenance  Topic Date Due   Diabetic kidney evaluation - Urine ACR  Never done   COVID-19 Vaccine (5 - 2023-24 season) 02/28/2023 (Originally 08/11/2022)   Zoster Vaccines- Shingrix (1 of 2) 03/29/2023 (Originally 08/07/1965)   MAMMOGRAM  06/20/2023   HEMOGLOBIN A1C  06/26/2023   OPHTHALMOLOGY EXAM  11/22/2023   Diabetic kidney evaluation - eGFR measurement  12/27/2023   FOOT EXAM  12/29/2023   Medicare Annual Wellness (AWV)  02/12/2024   DTaP/Tdap/Td (2 - Td or Tdap) 02/28/2026   COLONOSCOPY (Pts 45-67yr Insurance coverage will need to be confirmed)  03/05/2028   Pneumonia Vaccine 75 Years old  Completed   INFLUENZA VACCINE  Completed   DEXA SCAN  Completed   Hepatitis C Screening  Completed   HPV VACCINES  Aged Out    Health Maintenance Health Maintenance Due  Topic Date Due   Diabetic kidney evaluation - Urine ACR  Never done   Mammogram- ordered. Number provided for scheduling [phone]  Lung Cancer Screening: (Low Dose CT Chest recommended if Age 75-80years, 30 pack-year currently smoking OR  have quit w/in 15years.) does not qualify.   Hepatitis C Screening: Completed 02/2016.   Vision Screening: Recommended annual ophthalmology exams for early detection of glaucoma and other disorders of the eye.  Dental Screening: Recommended annual dental exams for proper oral hygiene  Community Resource Referral / Chronic Care Management: CRR required this visit?  No   CCM required this visit?  No      Plan:     I have personally reviewed and noted the following in the patient's chart:   Medical and social history Use of alcohol, tobacco or illicit drugs  Current medications and supplements including opioid prescriptions. Patient is not currently taking opioid prescriptions. Functional ability and status Nutritional status Physical activity Advanced directives List of other physicians Hospitalizations, surgeries, and ER visits in  previous 12 months Vitals Screenings to include cognitive, depression, and falls Referrals and appointments  In addition, I have reviewed and discussed with patient certain preventive protocols, quality metrics, and best practice recommendations. A written personalized care plan for preventive services as well as general preventive health recommendations were provided to patient.     Leta Jungling, LPN   [id number]

## 2023-03-05 ENCOUNTER — Other Ambulatory Visit: Payer: Self-pay | Admitting: Internal Medicine

## 2023-03-12 ENCOUNTER — Ambulatory Visit (INDEPENDENT_AMBULATORY_CARE_PROVIDER_SITE_OTHER): Payer: Medicare PPO

## 2023-03-12 DIAGNOSIS — E538 Deficiency of other specified B group vitamins: Secondary | ICD-10-CM | POA: Diagnosis not present

## 2023-03-12 MED ORDER — CYANOCOBALAMIN 1000 MCG/ML IJ SOLN
1000.0000 ug | Freq: Once | INTRAMUSCULAR | Status: AC
  Administered 2023-03-12: 1000 ug via INTRAMUSCULAR

## 2023-03-12 NOTE — Progress Notes (Signed)
Pt presented for their vitamin B12 injection. Pt was identified through two identifiers. Pt tolerated shot well in their right deltoid.  

## 2023-03-19 DIAGNOSIS — F3341 Major depressive disorder, recurrent, in partial remission: Secondary | ICD-10-CM | POA: Diagnosis not present

## 2023-03-19 DIAGNOSIS — F4323 Adjustment disorder with mixed anxiety and depressed mood: Secondary | ICD-10-CM | POA: Diagnosis not present

## 2023-04-12 ENCOUNTER — Ambulatory Visit (INDEPENDENT_AMBULATORY_CARE_PROVIDER_SITE_OTHER): Payer: Medicare PPO | Admitting: *Deleted

## 2023-04-12 ENCOUNTER — Ambulatory Visit: Payer: Medicare PPO

## 2023-04-12 DIAGNOSIS — E538 Deficiency of other specified B group vitamins: Secondary | ICD-10-CM

## 2023-04-12 MED ORDER — CYANOCOBALAMIN 1000 MCG/ML IJ SOLN
1000.0000 ug | Freq: Once | INTRAMUSCULAR | Status: AC
  Administered 2023-04-12: 1000 ug via INTRAMUSCULAR

## 2023-04-12 NOTE — Progress Notes (Signed)
Pt received B12 injection in Left deltoid. Pt tolerated it well with no complaints or concerns.

## 2023-04-16 ENCOUNTER — Encounter (HOSPITAL_COMMUNITY): Payer: Self-pay

## 2023-04-16 DIAGNOSIS — F4323 Adjustment disorder with mixed anxiety and depressed mood: Secondary | ICD-10-CM | POA: Diagnosis not present

## 2023-04-16 DIAGNOSIS — F3341 Major depressive disorder, recurrent, in partial remission: Secondary | ICD-10-CM | POA: Diagnosis not present

## 2023-04-23 ENCOUNTER — Other Ambulatory Visit: Payer: Self-pay

## 2023-04-23 ENCOUNTER — Ambulatory Visit
Admission: RE | Admit: 2023-04-23 | Discharge: 2023-04-23 | Disposition: A | Discharge status: Home / Self Care | Payer: Medicare PPO | Attending: Gastroenterology | Admitting: Gastroenterology

## 2023-04-23 ENCOUNTER — Encounter
Admission: RE | Disposition: A | Discharge status: Home / Self Care | Payer: Self-pay | Source: Home / Self Care | Attending: Gastroenterology

## 2023-04-23 ENCOUNTER — Ambulatory Visit: Payer: Medicare PPO | Admitting: Anesthesiology

## 2023-04-23 ENCOUNTER — Encounter: Payer: Self-pay | Admitting: *Deleted

## 2023-04-23 DIAGNOSIS — Z86010 Personal history of colonic polyps: Secondary | ICD-10-CM | POA: Diagnosis not present

## 2023-04-23 DIAGNOSIS — Z800 Family history of malignant neoplasm of digestive organs: Secondary | ICD-10-CM | POA: Diagnosis not present

## 2023-04-23 DIAGNOSIS — F32A Depression, unspecified: Secondary | ICD-10-CM | POA: Insufficient documentation

## 2023-04-23 DIAGNOSIS — Z09 Encounter for follow-up examination after completed treatment for conditions other than malignant neoplasm: Secondary | ICD-10-CM | POA: Diagnosis not present

## 2023-04-23 DIAGNOSIS — Z1211 Encounter for screening for malignant neoplasm of colon: Secondary | ICD-10-CM | POA: Insufficient documentation

## 2023-04-23 DIAGNOSIS — Z90710 Acquired absence of both cervix and uterus: Secondary | ICD-10-CM | POA: Insufficient documentation

## 2023-04-23 DIAGNOSIS — I10 Essential (primary) hypertension: Secondary | ICD-10-CM | POA: Diagnosis not present

## 2023-04-23 DIAGNOSIS — K5730 Diverticulosis of large intestine without perforation or abscess without bleeding: Secondary | ICD-10-CM | POA: Insufficient documentation

## 2023-04-23 DIAGNOSIS — E785 Hyperlipidemia, unspecified: Secondary | ICD-10-CM | POA: Insufficient documentation

## 2023-04-23 HISTORY — PX: COLONOSCOPY WITH PROPOFOL: SHX5780

## 2023-04-23 SURGERY — COLONOSCOPY WITH PROPOFOL
Anesthesia: General

## 2023-04-23 MED ORDER — PROPOFOL 500 MG/50ML IV EMUL
INTRAVENOUS | Status: DC | PRN
  Administered 2023-04-23: 125 ug/kg/min via INTRAVENOUS

## 2023-04-23 MED ORDER — SODIUM CHLORIDE 0.9 % IV SOLN: INTRAVENOUS | Status: DC

## 2023-04-23 MED ORDER — LIDOCAINE HCL (CARDIAC) PF 100 MG/5ML IV SOSY
PREFILLED_SYRINGE | INTRAVENOUS | Status: DC | PRN
  Administered 2023-04-23: 20 mg via INTRAVENOUS

## 2023-04-23 MED ORDER — PROPOFOL 10 MG/ML IV BOLUS
INTRAVENOUS | Status: DC | PRN
  Administered 2023-04-23: 80 mg via INTRAVENOUS
  Administered 2023-04-23: 30 mg via INTRAVENOUS

## 2023-04-23 NOTE — Op Note (Signed)
[facility] Patient Name: Angelica Robertson Procedure Date: 04/23/2023 8:33 AM MRN: [id number] Account #: [id number] Date of Birth: 1946-03-31 Admit Type: Outpatient Age: 77 Room: [facility] ENDO ROOM 3 Gender: Female Note Status: Finalized Instrument Name: Nelda Marseille [id number] Procedure:             Colonoscopy Indications:           High risk colon cancer surveillance: Personal history                         of colonic polyps, Family history of colon cancer in a                         first-degree relative before age 56 years Providers:             Eather Colas MD, MD Medicines:             Monitored Anesthesia Care Complications:         No immediate complications. Procedure:             Pre-Anesthesia Assessment:                        - Prior to the procedure, a History and Physical was                         performed, and patient medications and allergies were                         reviewed. The patient is competent. The risks and                         benefits of the procedure and the sedation options and                         risks were discussed with the patient. All questions                         were answered and informed consent was obtained.                         Patient identification and proposed procedure were                         verified by the physician, the nurse, the                         anesthesiologist, the anesthetist and the technician                         in the endoscopy suite. Mental Status Examination:                         alert and oriented. Airway Examination: normal                         oropharyngeal airway and neck mobility. Respiratory                         Examination: clear to auscultation.  CV Examination:                         normal. Prophylactic Antibiotics: The patient does not                         require prophylactic antibiotics. Prior                         Anticoagulants: The  patient has taken no anticoagulant                         or antiplatelet agents. ASA Grade Assessment: II - A                         patient with mild systemic disease. After reviewing                         the risks and benefits, the patient was deemed in                         satisfactory condition to undergo the procedure. The                         anesthesia plan was to use monitored anesthesia care                         (MAC). Immediately prior to administration of                         medications, the patient was re-assessed for adequacy                         to receive sedatives. The heart rate, respiratory                         rate, oxygen saturations, blood pressure, adequacy of                         pulmonary ventilation, and response to care were                         monitored throughout the procedure. The physical                         status of the patient was re-assessed after the                         procedure.                        After obtaining informed consent, the colonoscope was                         passed under direct vision. Throughout the procedure,                         the patient's blood pressure, pulse, and oxygen  saturations were monitored continuously. The                         Colonoscope was introduced through the anus and                         advanced to the the cecum, identified by appendiceal                         orifice and ileocecal valve. The colonoscopy was                         performed without difficulty. The patient tolerated                         the procedure well. The quality of the bowel                         preparation was good. The ileocecal valve, appendiceal                         orifice, and rectum were photographed. Findings:      The perianal and digital rectal examinations were normal.      Scattered small-mouthed diverticula were found in the sigmoid colon.       The exam was otherwise without abnormality on direct and retroflexion       views. Impression:            - Diverticulosis in the sigmoid colon.                        - The examination was otherwise normal on direct and                         retroflexion views.                        - No specimens collected. Recommendation:        - Discharge patient to home.                        - Resume previous diet.                        - Continue present medications.                        - Repeat colonoscopy in 5 years for surveillance.                        - Return to referring physician as previously                         scheduled. Procedure Code(s):     --- Professional ---                        Z6109, Colorectal cancer screening; colonoscopy on                         individual at high risk Diagnosis Code(s):     ---  Professional ---                        Z86.010, Personal history of colonic polyps                        Z80.0, Family history of malignant neoplasm of                         digestive organs                        K57.30, Diverticulosis of large intestine without                         perforation or abscess without bleeding CPT copyright 2022 American Medical Association. All rights reserved. The codes documented in this report are preliminary and upon coder review may  be revised to meet current compliance requirements. Eather Colas MD, MD 04/23/2023 9:08:19 AM Number of Addenda: 0 Note Initiated On: 04/23/2023 8:33 AM Scope Withdrawal Time: 0 hours 7 minutes 7 seconds  Total Procedure Duration: 0 hours 11 minutes 55 seconds  Estimated Blood Loss:  Estimated blood loss: none.      [facility]

## 2023-04-23 NOTE — Anesthesia Preprocedure Evaluation (Addendum)
Anesthesia Evaluation  Patient identified by MRN, date of birth, ID band Patient awake    Reviewed: Allergy & Precautions, NPO status , Patient's Chart, lab work & pertinent test results  History of Anesthesia Complications Negative for: history of anesthetic complications  Airway Mallampati: IV   Neck ROM: Full    Dental  (+) Missing   Pulmonary neg pulmonary ROS   Pulmonary exam normal breath sounds clear to auscultation       Cardiovascular hypertension, Normal cardiovascular exam Rhythm:Regular Rate:Normal  ECG 06/08/22: SR no acute ischemic changes  Myocardial perfusion 01/17/18: normal  myocardial perfusion scan no evidence of  stress-induced myocardial ischemia ejection fraction 70% ,conclusion  negative scan. Low risk scan   Echo 01/17/18: NORMAL LEFT VENTRICULAR SYSTOLIC FUNCTION WITH AN ESTIMATED EF = >55 %  NORMAL RIGHT VENTRICULAR SYSTOLIC FUNCTION  MILD MITRAL VALVE REGURGITATION  TRACE TRICUSPID VALVE REGURGITATION  NO VALVULAR STENOSIS  MILD LVH    Neuro/Psych  Headaches PSYCHIATRIC DISORDERS  Depression       GI/Hepatic ,GERD  ,,  Endo/Other  Prediabetes   Renal/GU negative Renal ROS     Musculoskeletal   Abdominal   Peds  Hematology negative hematology ROS (+)   Anesthesia Other Findings   Reproductive/Obstetrics                             Anesthesia Physical Anesthesia Plan  ASA: 2  Anesthesia Plan: General   Post-op Pain Management:    Induction: Intravenous  PONV Risk Score and Plan: 3 and Propofol infusion, TIVA and Treatment may vary due to age or medical condition  Airway Management Planned: Natural Airway  Additional Equipment:   Intra-op Plan:   Post-operative Plan:   Informed Consent: I have reviewed the patients History and Physical, chart, labs and discussed the procedure including the risks, benefits and alternatives for the proposed  anesthesia with the patient or authorized representative who has indicated his/her understanding and acceptance.       Plan Discussed with: CRNA  Anesthesia Plan Comments: (LMA/GETA backup discussed.  Patient consented for risks of anesthesia including but not limited to:  - adverse reactions to medications - damage to eyes, teeth, lips or other oral mucosa - nerve damage due to positioning  - sore throat or hoarseness - damage to heart, brain, nerves, lungs, other parts of body or loss of life  Informed patient about role of CRNA in peri- and intra-operative care.  Patient voiced understanding.)        Anesthesia Quick Evaluation

## 2023-04-23 NOTE — Transfer of Care (Signed)
Immediate Anesthesia Transfer of Care Note  Patient: Angelica Robertson  Procedure(s) Performed: COLONOSCOPY WITH PROPOFOL  Patient Location: PACU  Anesthesia Type:General  Level of Consciousness: drowsy  Airway & Oxygen Therapy: Patient Spontanous Breathing  Post-op Assessment: Report given to RN and Post -op Vital signs reviewed and stable  Post vital signs: Reviewed and stable  Last Vitals:  Vitals Value Taken Time  BP 106/60 04/23/23 0907  Temp 97.0   Pulse 79 04/23/23 0907  Resp 15 04/23/23 0907  SpO2 94 % 04/23/23 0907  Vitals shown include unvalidated device data.  Last Pain:  Vitals:   04/23/23 0814  TempSrc: Temporal  PainSc: 0-No pain         Complications: No notable events documented.

## 2023-04-23 NOTE — H&P (Signed)
Outpatient short stay form Pre-procedure 04/23/2023  Regis Bill, MD  Primary Physician: Dale Kentfield, MD  Reason for visit:  Surveillance  History of present illness:    77 y/o lady with history of hypertension and HLD here for surveillance colonoscopy. Last colonoscopy in 2019 was normal. No blood thinners. Brother with colon cancer in his 39's. History of vaginal hysterectomy.    Current Facility-Administered Medications:    0.9 %  sodium chloride infusion, , Intravenous, Continuous, Haze Antillon, Rossie Muskrat, MD, Last Rate: 20 mL/hr at 04/23/23 0828, New Bag at 04/23/23 0828  Medications Prior to Admission  Medication Sig Dispense Refill Last Dose   ALPRAZolam (XANAX) 0.25 MG tablet Take 1 tablet (0.25 mg total) by mouth as directed. Take daily as needed for severe anxiety attacks ,please limit use 15 tablet 0 04/22/2023   amLODipine (NORVASC) 2.5 MG tablet Take 1 tablet (2.5 mg total) by mouth daily. In am 90 tablet 3 04/22/2023   calcium carbonate (OS-CAL) 600 MG TABS tablet Take 600 mg by mouth 2 (two) times daily with a meal.   04/22/2023   escitalopram (LEXAPRO) 10 MG tablet Take 1 tablet (10 mg total) by mouth daily with breakfast. 90 tablet 0 04/23/2023 at 0645   losartan (COZAAR) 25 MG tablet TAKE 1 TABLET BY MOUTH ONCE DAILY 90 tablet 1 04/22/2023   mirtazapine (REMERON) 15 MG tablet Take 1.5 tablets (22.5 mg total) by mouth at bedtime. 135 tablet 0 04/22/2023   pantoprazole (PROTONIX) 20 MG tablet Take 1 tablet (20 mg total) by mouth daily. 90 tablet 1 04/22/2023   rosuvastatin (CRESTOR) 20 MG tablet Take 1 tablet (20 mg total) by mouth at bedtime. 90 tablet 3 04/22/2023   VITAMIN D PO Take by mouth daily.   Past Week     No Known Allergies   Past Medical History:  Diagnosis Date   Depression 2000   Heart murmur    Hypercholesterolemia Osteoporosis   Hyperlipemia     Review of systems:  Otherwise negative.    Physical Exam  Gen: Alert, oriented. Appears stated  age.  HEENT: PERRLA. Lungs: No respiratory distress CV: RRR Abd: soft, benign, no masses Ext: No edema    Planned procedures: Proceed with colonoscopy. The patient understands the nature of the planned procedure, indications, risks, alternatives and potential complications including but not limited to bleeding, infection, perforation, damage to internal organs and possible oversedation/side effects from anesthesia. The patient agrees and gives consent to proceed.  Please refer to procedure notes for findings, recommendations and patient disposition/instructions.     Regis Bill, MD [facility]

## 2023-04-23 NOTE — Interval H&P Note (Signed)
History and Physical Interval Note:  04/23/2023 8:45 AM  Angelica Robertson  has presented today for surgery, with the diagnosis of HX OF ADENOMATOUS POLYP OF COLON.  The various methods of treatment have been discussed with the patient and family. After consideration of risks, benefits and other options for treatment, the patient has consented to  Procedure(s): COLONOSCOPY WITH PROPOFOL (N/A) as a surgical intervention.  The patient's history has been reviewed, patient examined, no change in status, stable for surgery.  I have reviewed the patient's chart and labs.  Questions were answered to the patient's satisfaction.     Regis Bill  Ok to proceed with colonoscopy

## 2023-04-23 NOTE — Anesthesia Postprocedure Evaluation (Signed)
Anesthesia Post Note  Patient: Angelica Robertson  Procedure(s) Performed: COLONOSCOPY WITH PROPOFOL  Patient location during evaluation: PACU Anesthesia Type: General Level of consciousness: awake and alert, oriented and patient cooperative Pain management: pain level controlled Vital Signs Assessment: post-procedure vital signs reviewed and stable Respiratory status: spontaneous breathing, nonlabored ventilation and respiratory function stable Cardiovascular status: blood pressure returned to baseline and stable Postop Assessment: adequate PO intake Anesthetic complications: no   No notable events documented.   Last Vitals:  Vitals:   04/23/23 0921 04/23/23 0930  BP:  128/72  Pulse: 73 68  Resp:  14  Temp:    SpO2: 96% 97%    Last Pain:  Vitals:   04/23/23 0814  TempSrc: Temporal  PainSc: 0-No pain                 Reed Breech

## 2023-04-24 ENCOUNTER — Encounter: Payer: Self-pay | Admitting: Psychiatry

## 2023-04-24 ENCOUNTER — Telehealth (INDEPENDENT_AMBULATORY_CARE_PROVIDER_SITE_OTHER): Payer: Medicare PPO | Admitting: Psychiatry

## 2023-04-24 DIAGNOSIS — F3342 Major depressive disorder, recurrent, in full remission: Secondary | ICD-10-CM | POA: Diagnosis not present

## 2023-04-24 DIAGNOSIS — F411 Generalized anxiety disorder: Secondary | ICD-10-CM | POA: Diagnosis not present

## 2023-04-24 MED ORDER — ESCITALOPRAM OXALATE 10 MG PO TABS: 10.0000 mg | ORAL_TABLET | Freq: Every day | ORAL | 1 refills | Status: DC

## 2023-04-24 MED ORDER — MIRTAZAPINE 15 MG PO TABS: 22.5000 mg | ORAL_TABLET | Freq: Every day | ORAL | 1 refills | Status: DC

## 2023-04-24 NOTE — Progress Notes (Unsigned)
Virtual Visit via Video Note  I connected with Angelica Robertson on 04/24/23 at 11:30 AM EDT by a video enabled telemedicine application and verified that I am speaking with the correct person using two identifiers.  Location Provider Location : [facility] Patient Location : Home  Participants: Patient , Provider    I discussed the limitations of evaluation and management by telemedicine and the availability of in person appointments. The patient expressed understanding and agreed to proceed.   I discussed the assessment and treatment plan with the patient. The patient was provided an opportunity to ask questions and all were answered. The patient agreed with the plan and demonstrated an understanding of the instructions.   The patient was advised to call back or seek an in-person evaluation if the symptoms worsen or if the condition fails to improve as anticipated.    [facility] MD OP Progress Note  04/25/2023 8:14 AM Angelica Robertson  MRN:  [id number]  Chief Complaint:  Chief Complaint  Patient presents with   Follow-up   Anxiety   Depression   Medication Refill   HPI: Angelica Robertson is a 77 year old Caucasian female, married, retired, lives in [place], has a history of MDD, GAD, hyperlipidemia, arthritis was evaluated by telemedicine today.  Patient today reports she is currently doing well.  Denies any significant sadness, anhedonia.  Reports appetite is fair.  She recently had colonoscopy and may have lost a couple of pounds.  Otherwise she has been maintaining okay.  Denies any significant anxiety symptoms.  Reports sleep is good.  Continues to be compliant on her medications like Lexapro and Remeron.  Denies side effects.  She continues to attend Bible study groups and church.  She took a vacation to [place] recently with her friends at the end of April.  She had a good time.  Patient denies any suicidality, homicidality or perceptual disturbances.  Patient reports since returning from [place]  she has been having flulike symptoms although it is currently getting better she continues to have mild headaches.  Agreeable to reach out to primary care provider as needed.  Patient denies any other concerns today.  Visit Diagnosis:    ICD-10-CM   1. MDD (major depressive disorder), recurrent, in full remission (HCC)  F33.42 mirtazapine (REMERON) 15 MG tablet    escitalopram (LEXAPRO) 10 MG tablet    2. Generalized anxiety disorder  F41.1 mirtazapine (REMERON) 15 MG tablet      Past Psychiatric History: I have reviewed past psychiatric history from progress note on 01/12/2020.  Patient attempted TMS-07/17/2022-could not tolerate it due to side effects of headaches.  Past Medical History:  Past Medical History:  Diagnosis Date   Depression 2000   Heart murmur    Hypercholesterolemia Osteoporosis   Hyperlipemia     Past Surgical History:  Procedure Laterality Date   ABDOMINAL HYSTERECTOMY  1980   COLONOSCOPY WITH PROPOFOL N/A 03/05/2018   Procedure: COLONOSCOPY WITH PROPOFOL;  Surgeon: Toledo, Boykin Nearing, MD;  Location: [facility] ENDOSCOPY;  Service: Endoscopy;  Laterality: N/A;   COLONOSCOPY WITH PROPOFOL N/A 04/23/2023   Procedure: COLONOSCOPY WITH PROPOFOL;  Surgeon: Regis Bill, MD;  Location: [facility] ENDOSCOPY;  Service: Endoscopy;  Laterality: N/A;    Family Psychiatric History: Reviewed family psychiatric history from progress note on 01/12/2020.  Family History:  Family History  Problem Relation Age of Onset   Heart disease Mother    Diabetes Mother    Heart disease Father    Diabetes Father  Colon cancer Other    Congestive Heart Failure Brother    Colon cancer Brother    Mental illness Paternal Aunt    BRCA 1/2 Neg Hx    Breast cancer Neg Hx    Cowden syndrome Neg Hx    DES usage Neg Hx    Endometrial cancer Neg Hx    Li-Fraumeni syndrome Neg Hx    Ovarian cancer Neg Hx     Social History: Reviewed social history from progress note on 01/12/2020. Social History    Socioeconomic History   Marital status: Married    Spouse name: robert   Number of children: 2   Years of education: Not on file   Highest education level: Not on file  Occupational History   Not on file  Tobacco Use   Smoking status: Never   Smokeless tobacco: Never  Vaping Use   Vaping Use: Never used  Substance and Sexual Activity   Alcohol use: Yes    Alcohol/week: 0.0 standard drinks of alcohol    Comment: occasionally - socially during the summer a glass of wine or beer   Drug use: No   Sexual activity: Yes  Other Topics Concern   Not on file  Social History Narrative   Not on file   Social Determinants of Health   Financial Resource Strain: Low Risk  (02/09/2023)   Overall Financial Resource Strain (CARDIA)    Difficulty of Paying Living Expenses: Not hard at all  Food Insecurity: No Food Insecurity (02/09/2023)   Hunger Vital Sign    Worried About Running Out of Food in the Last Year: Never true    Ran Out of Food in the Last Year: Never true  Transportation Needs: No Transportation Needs (02/09/2023)   PRAPARE - Administrator, Civil Service (Medical): No    Lack of Transportation (Non-Medical): No  Physical Activity: Insufficiently Active (02/09/2023)   Exercise Vital Sign    Days of Exercise per Week: 3 days    Minutes of Exercise per Session: 30 min  Stress: No Stress Concern Present (02/09/2023)   [employer] of Occupational Health - Occupational Stress Questionnaire    Feeling of Stress : Not at all  Social Connections: Unknown (02/09/2023)   Social Connection and Isolation Panel [NHANES]    Frequency of Communication with Friends and Family: Once a week    Frequency of Social Gatherings with Friends and Family: More than three times a week    Attends Religious Services: Not on file    Active Member of Clubs or Organizations: Yes    Attends Banker Meetings: More than 4 times per year    Marital Status: Married    Allergies: No  Known Allergies  Metabolic Disorder Labs: Lab Results  Component Value Date   HGBA1C 6.2 12/26/2022   No results found for: "PROLACTIN" Lab Results  Component Value Date   CHOL 142 12/26/2022   TRIG 67.0 12/26/2022   HDL 62.00 12/26/2022   CHOLHDL 2 12/26/2022   VLDL 13.4 12/26/2022   LDLCALC 67 12/26/2022   LDLCALC 50 07/19/2022   Lab Results  Component Value Date   TSH 1.20 09/22/2022   TSH 4.46 03/21/2022    Therapeutic Level Labs: No results found for: "LITHIUM" No results found for: "VALPROATE" No results found for: "CBMZ"  Current Medications: Current Outpatient Medications  Medication Sig Dispense Refill   ALPRAZolam (XANAX) 0.25 MG tablet Take 1 tablet (0.25 mg total) by mouth as directed.  Take daily as needed for severe anxiety attacks ,please limit use 15 tablet 0   amLODipine (NORVASC) 2.5 MG tablet Take 1 tablet (2.5 mg total) by mouth daily. In am 90 tablet 3   calcium carbonate (OS-CAL) 600 MG TABS tablet Take 600 mg by mouth 2 (two) times daily with a meal.     losartan (COZAAR) 25 MG tablet TAKE 1 TABLET BY MOUTH ONCE DAILY 90 tablet 1   pantoprazole (PROTONIX) 20 MG tablet Take 1 tablet (20 mg total) by mouth daily. 90 tablet 1   rosuvastatin (CRESTOR) 20 MG tablet Take 1 tablet (20 mg total) by mouth at bedtime. 90 tablet 3   VITAMIN D PO Take by mouth daily.     escitalopram (LEXAPRO) 10 MG tablet Take 1 tablet (10 mg total) by mouth daily with breakfast. 90 tablet 1   mirtazapine (REMERON) 15 MG tablet Take 1.5 tablets (22.5 mg total) by mouth at bedtime. 135 tablet 1   No current facility-administered medications for this visit.     Musculoskeletal: Strength & Muscle Tone:  UTA Gait & Station:  Seated Patient leans: N/A  Psychiatric Specialty Exam: Review of Systems  Neurological:  Positive for headaches (currently has flu like symptoms - although improving).  Psychiatric/Behavioral: Negative.    All other systems reviewed and are  negative.   Last menstrual period 11/27/1979.There is no height or weight on file to calculate BMI.  General Appearance: Casual  Eye Contact:  Fair  Speech:  Clear and Coherent  Volume:  Normal  Mood:  Euthymic  Affect:  Congruent  Thought Process:  Goal Directed and Descriptions of Associations: Intact  Orientation:  Full (Time, Place, and Person)  Thought Content: Logical   Suicidal Thoughts:  No  Homicidal Thoughts:  No  Memory:  Immediate;   Fair Recent;   Fair Remote;   Fair  Judgement:  Fair  Insight:  Fair  Psychomotor Activity:  Normal  Concentration:  Concentration: Fair and Attention Span: Fair  Recall:  [employer] of Knowledge: Fair  Language: Fair  Akathisia:  No  Handed:  Right  AIMS (if indicated): not done  Assets:  Communication Skills Desire for Improvement Housing Intimacy Social Support  ADL's:  Intact  Cognition: WNL  Sleep:  Fair   Screenings: AIMS    Flowsheet Row Office Visit from 06/20/2022 in [facility] Visit from [facility]24/2022 [facility]  AIMS Total Score 0 0      AUDIT    Flowsheet Row Admission (Discharged) from 05/12/2022 in [facility] Admission (Discharged) from 12/17/2019 in [facility]ICINE Admission (Discharged) [facility]  Alcohol Use Disorder Identification Test Final Score (AUDIT) 0 0 0      GAD-7    Flowsheet Row Office Visit from 11/15/2022 in [facility] Office Visit from 09/20/2022 in [facility] Office Visit from 08/22/2022 in [facility] Office Visit from 08/18/2022 in [facility] Erroneous Encounter from 07/10/2022 in [facility]  Total GAD-7 Score  1 6 20 7 18       Mini-Mental    Flowsheet Row Clinical Support from 02/27/2018 in [facility] [employer] Clinical Support from 02/02/2017 in [facility] [employer] Clinical Support from 02/03/2016 in [facility]  Nature conservation officer at [employer]  Total Score (max 30 points ) 30 30 30       PHQ2-9    Flowsheet Row Clinical Support from 02/12/2023 in [facility] [employer] Video Visit from 02/01/2023 in [facility] Office Visit from 11/15/2022 in [facility] Office Visit from 10/26/2022 in [facility] Visit from 09/22/2022 in [facility] [employer] [facility]  PHQ-2 Total Score 0 0 0 0 2  PHQ-9 Total Score -- -- 0 0 8      Flowsheet Row Video Visit from 04/24/2023 in [facility] Admission (Discharged) from 04/23/2023 in [facility] Video Visit from 02/01/2023 in [facility]  C-SSRS RISK CATEGORY No Risk No Risk No Risk        Assessment and Plan: Angelica Robertson is a 77 year old Caucasian female, married, retired, lives in [place], has a history of MDD, anxiety, hyperlipidemia was evaluated by telemedicine today.  Patient is currently stable.  Plan as noted below.  Plan MDD in remission Mirtazapine 22.5 mg p.o. nightly Lexapro 10 mg p.o. daily Continue CBT with Ms. Algis Downs as needed.  GAD-stable Mirtazapine 22.5 mg p.o. nightly Lexapro 10 mg p.o. daily  Follow-up in clinic in 5 months or sooner if needed.   Collaboration of Care: Collaboration of Care: Other patient encouraged to follow up with primary care provider for current headaches.  Patient/Guardian was advised Release of Information must be obtained prior to any record  release in order to collaborate their care with an outside provider. Patient/Guardian was advised if they have not already done so to contact the registration department to sign all necessary forms in order for [place] to release information regarding their care.   Consent: Patient/Guardian gives verbal consent for treatment and assignment of benefits for services provided during this visit. Patient/Guardian expressed understanding and agreed to proceed.   This note was generated in part or whole with voice recognition software. Voice recognition is usually quite accurate but there are transcription errors that can and very often do occur. I apologize for any typographical errors that were not detected and corrected.    Jomarie Longs, MD 04/25/2023, 8:14 AM

## 2023-04-25 ENCOUNTER — Other Ambulatory Visit (INDEPENDENT_AMBULATORY_CARE_PROVIDER_SITE_OTHER): Payer: Medicare PPO

## 2023-04-25 ENCOUNTER — Other Ambulatory Visit: Payer: Medicare PPO

## 2023-04-25 DIAGNOSIS — I10 Essential (primary) hypertension: Secondary | ICD-10-CM | POA: Diagnosis not present

## 2023-04-25 DIAGNOSIS — E7800 Pure hypercholesterolemia, unspecified: Secondary | ICD-10-CM

## 2023-04-25 DIAGNOSIS — R739 Hyperglycemia, unspecified: Secondary | ICD-10-CM | POA: Diagnosis not present

## 2023-04-25 LAB — HEPATIC FUNCTION PANEL
ALT: 18 U/L (ref 0–35)
AST: 20 U/L (ref 0–37)
Albumin: 4.3 g/dL (ref 3.5–5.2)
Alkaline Phosphatase: 55 U/L (ref 39–117)
Bilirubin, Direct: 0.1 mg/dL (ref 0.0–0.3)
Total Bilirubin: 0.6 mg/dL (ref 0.2–1.2)
Total Protein: 6.7 g/dL (ref 6.0–8.3)

## 2023-04-25 LAB — LIPID PANEL
Cholesterol: 109 mg/dL (ref 0–200)
HDL: 44.8 mg/dL
LDL Cholesterol: 42 mg/dL (ref 0–99)
NonHDL: 64.09
Total CHOL/HDL Ratio: 2
Triglycerides: 111 mg/dL (ref 0.0–149.0)
VLDL: 22.2 mg/dL (ref 0.0–40.0)

## 2023-04-25 LAB — BASIC METABOLIC PANEL
BUN: 13 mg/dL (ref 6–23)
CO2: 28 mEq/L (ref 19–32)
Calcium: 10 mg/dL (ref 8.4–10.5)
Chloride: 103 mEq/L (ref 96–112)
Creatinine, Ser: 0.85 mg/dL (ref 0.40–1.20)
GFR: 67.35 mL/min (ref >60.00)
Glucose, Bld: 112 mg/dL — ABNORMAL HIGH (ref 70–99)
Potassium: 4 mEq/L (ref 3.5–5.1)
Sodium: 139 mEq/L (ref 135–145)

## 2023-04-25 LAB — HEMOGLOBIN A1C: Hgb A1c MFr Bld: 6.3 % (ref 4.6–6.5)

## 2023-04-30 ENCOUNTER — Ambulatory Visit: Payer: Medicare PPO | Admitting: Internal Medicine

## 2023-05-01 ENCOUNTER — Ambulatory Visit: Payer: Medicare PPO | Admitting: Internal Medicine

## 2023-05-01 ENCOUNTER — Encounter: Payer: Self-pay | Admitting: Internal Medicine

## 2023-05-01 VITALS — BP 112/72 | HR 81 | Temp 98.3°F | Resp 16 | Ht 63.0 in | Wt 138.0 lb

## 2023-05-01 DIAGNOSIS — K635 Polyp of colon: Secondary | ICD-10-CM

## 2023-05-01 DIAGNOSIS — I10 Essential (primary) hypertension: Secondary | ICD-10-CM | POA: Diagnosis not present

## 2023-05-01 DIAGNOSIS — R739 Hyperglycemia, unspecified: Secondary | ICD-10-CM

## 2023-05-01 DIAGNOSIS — E1165 Type 2 diabetes mellitus with hyperglycemia: Secondary | ICD-10-CM

## 2023-05-01 DIAGNOSIS — M542 Cervicalgia: Secondary | ICD-10-CM

## 2023-05-01 DIAGNOSIS — F332 Major depressive disorder, recurrent severe without psychotic features: Secondary | ICD-10-CM

## 2023-05-01 DIAGNOSIS — E7800 Pure hypercholesterolemia, unspecified: Secondary | ICD-10-CM | POA: Diagnosis not present

## 2023-05-01 DIAGNOSIS — R42 Dizziness and giddiness: Secondary | ICD-10-CM | POA: Diagnosis not present

## 2023-05-01 DIAGNOSIS — K219 Gastro-esophageal reflux disease without esophagitis: Secondary | ICD-10-CM

## 2023-05-01 DIAGNOSIS — K649 Unspecified hemorrhoids: Secondary | ICD-10-CM

## 2023-05-01 DIAGNOSIS — Z1231 Encounter for screening mammogram for malignant neoplasm of breast: Secondary | ICD-10-CM

## 2023-05-01 MED ORDER — AMLODIPINE BESYLATE 2.5 MG PO TABS: 2.5000 mg | ORAL_TABLET | Freq: Every day | ORAL | 3 refills | Status: DC

## 2023-05-01 MED ORDER — ROSUVASTATIN CALCIUM 20 MG PO TABS: 20.0000 mg | ORAL_TABLET | Freq: Every day | ORAL | 3 refills | Status: DC

## 2023-05-01 MED ORDER — PANTOPRAZOLE SODIUM 20 MG PO TBEC: 20.0000 mg | DELAYED_RELEASE_TABLET | Freq: Every day | ORAL | 1 refills | Status: DC

## 2023-05-01 NOTE — Assessment & Plan Note (Signed)
Doing well on protonix 20mg  q day.  Continue.  Follow.

## 2023-05-01 NOTE — Progress Notes (Signed)
Subjective:    Patient ID: Angelica Robertson, female    DOB: 1946-01-27, 77 y.o.   MRN: [id number]  Patient here for  Chief Complaint  Patient presents with   Medical Management of Chronic Issues    HPI Here to follow up regarding hypercholesterolemia, blood pressure and depression. Continues on losartan and amlodipine.  Reviewed outside blood pressure checks - averaging 116-126/60-70.  Stays active.  Exercises.  No chest pain or sob.  No cough or congestion.  Last visit, decreased protonix to 20mg  q day.  Doing well on this dose.  No acid reflux. Colonoscopy 04/23/23 - diverticulosis.  Recommended f/u in 5 years.  She has noticed a little irritation - rectum after colonoscopy. She is using preparation H cream and wipes.  Is getting better.  Will notify me if feels needs anything more.  Handling stress.  Doing well.  Planning a trip to [place] in the fall.   Past Medical History:  Diagnosis Date   Depression 2000   Heart murmur    Hypercholesterolemia Osteoporosis   Hyperlipemia    Past Surgical History:  Procedure Laterality Date   ABDOMINAL HYSTERECTOMY  1980   COLONOSCOPY WITH PROPOFOL N/A 03/05/2018   Procedure: COLONOSCOPY WITH PROPOFOL;  Surgeon: Toledo, Boykin Nearing, MD;  Location: [facility] ENDOSCOPY;  Service: Endoscopy;  Laterality: N/A;   COLONOSCOPY WITH PROPOFOL N/A 04/23/2023   Procedure: COLONOSCOPY WITH PROPOFOL;  Surgeon: Regis Bill, MD;  Location: [facility] ENDOSCOPY;  Service: Endoscopy;  Laterality: N/A;   Family History  Problem Relation Age of Onset   Heart disease Mother    Diabetes Mother    Heart disease Father    Diabetes Father    Colon cancer Other    Congestive Heart Failure Brother    Colon cancer Brother    Mental illness Paternal Aunt    BRCA 1/2 Neg Hx    Breast cancer Neg Hx    Cowden syndrome Neg Hx    DES usage Neg Hx    Endometrial cancer Neg Hx    Li-Fraumeni syndrome Neg Hx    Ovarian cancer Neg Hx    Social History   Socioeconomic History    Marital status: Married    Spouse name: robert   Number of children: 2   Years of education: Not on file   Highest education level: 12th grade  Occupational History   Not on file  Tobacco Use   Smoking status: Never   Smokeless tobacco: Never  Vaping Use   Vaping Use: Never used  Substance and Sexual Activity   Alcohol use: Yes    Alcohol/week: 0.0 standard drinks of alcohol    Comment: occasionally - socially during the summer a glass of wine or beer   Drug use: No   Sexual activity: Yes  Other Topics Concern   Not on file  Social History Narrative   Not on file   Social Determinants of Health   Financial Resource Strain: Low Risk  (04/29/2023)   Overall Financial Resource Strain (CARDIA)    Difficulty of Paying Living Expenses: Not hard at all  Food Insecurity: No Food Insecurity (04/29/2023)   Hunger Vital Sign    Worried About Running Out of Food in the Last Year: Never true    Ran Out of Food in the Last Year: Never true  Transportation Needs: No Transportation Needs (04/29/2023)   PRAPARE - Administrator, Civil Service (Medical): No    Lack of Transportation (Non-Medical):  No  Physical Activity: Insufficiently Active (04/29/2023)   Exercise Vital Sign    Days of Exercise per Week: 3 days    Minutes of Exercise per Session: 30 min  Stress: No Stress Concern Present (04/29/2023)   [employer] of Occupational Health - Occupational Stress Questionnaire    Feeling of Stress : Not at all  Social Connections: Socially Integrated (04/29/2023)   Social Connection and Isolation Panel [NHANES]    Frequency of Communication with Friends and Family: Twice a week    Frequency of Social Gatherings with Friends and Family: Twice a week    Attends Religious Services: More than 4 times per year    Active Member of [employer] or Organizations: Yes    Attends Engineer, structural: More than 4 times per year    Marital Status: Married     Review of Systems   Constitutional:  Negative for appetite change and unexpected weight change.  HENT:  Negative for congestion and sinus pressure.   Respiratory:  Negative for cough, chest tightness and shortness of breath.   Cardiovascular:  Negative for chest pain, palpitations and leg swelling.  Gastrointestinal:  Negative for abdominal pain, diarrhea, nausea and vomiting.  Genitourinary:  Negative for difficulty urinating and dysuria.  Musculoskeletal:  Negative for joint swelling and myalgias.       Some neck discomfort.  Aggravated when exercising.  Getting better.  Will notify me if does not continue to improve.   Skin:  Negative for color change and rash.  Neurological:  Negative for dizziness and headaches.  Psychiatric/Behavioral:  Negative for agitation and dysphoric mood.        Objective:     BP 112/72   Pulse 81   Temp 98.3 F (36.8 C)   Resp 16   Ht 5\' 3"  (1.6 m)   Wt 138 lb (62.6 kg)   LMP 11/27/1979   SpO2 98%   BMI 24.45 kg/m  Wt Readings from Last 3 Encounters:  05/01/23 138 lb (62.6 kg)  04/23/23 135 lb 12.8 oz (61.6 kg)  02/12/23 130 lb (59 kg)    Physical Exam Vitals reviewed.  Constitutional:      General: She is not in acute distress.    Appearance: Normal appearance.  HENT:     Head: Normocephalic and atraumatic.     Right Ear: External ear normal.     Left Ear: External ear normal.  Eyes:     General: No scleral icterus.       Right eye: No discharge.        Left eye: No discharge.     Conjunctiva/sclera: Conjunctivae normal.  Neck:     Thyroid: No thyromegaly.  Cardiovascular:     Rate and Rhythm: Normal rate and regular rhythm.  Pulmonary:     Effort: No respiratory distress.     Breath sounds: Normal breath sounds. No wheezing.  Abdominal:     General: Bowel sounds are normal.     Palpations: Abdomen is soft.     Tenderness: There is no abdominal tenderness.  Musculoskeletal:        General: No swelling or tenderness.     Cervical back: Neck  supple. No tenderness.  Lymphadenopathy:     Cervical: No cervical adenopathy.  Skin:    Findings: No erythema or rash.  Neurological:     Mental Status: She is alert.  Psychiatric:        Mood and Affect: Mood normal.  Behavior: Behavior normal.      Outpatient Encounter Medications as of 05/01/2023  Medication Sig   ALPRAZolam (XANAX) 0.25 MG tablet Take 1 tablet (0.25 mg total) by mouth as directed. Take daily as needed for severe anxiety attacks ,please limit use   amLODipine (NORVASC) 2.5 MG tablet Take 1 tablet (2.5 mg total) by mouth daily. In am   calcium carbonate (OS-CAL) 600 MG TABS tablet Take 600 mg by mouth 2 (two) times daily with a meal.   escitalopram (LEXAPRO) 10 MG tablet Take 1 tablet (10 mg total) by mouth daily with breakfast.   losartan (COZAAR) 25 MG tablet TAKE 1 TABLET BY MOUTH ONCE DAILY   mirtazapine (REMERON) 15 MG tablet Take 1.5 tablets (22.5 mg total) by mouth at bedtime.   pantoprazole (PROTONIX) 20 MG tablet Take 1 tablet (20 mg total) by mouth daily.   rosuvastatin (CRESTOR) 20 MG tablet Take 1 tablet (20 mg total) by mouth at bedtime.   VITAMIN D PO Take by mouth daily.   [DISCONTINUED] amLODipine (NORVASC) 2.5 MG tablet Take 1 tablet (2.5 mg total) by mouth daily. In am   [DISCONTINUED] pantoprazole (PROTONIX) 20 MG tablet Take 1 tablet (20 mg total) by mouth daily.   [DISCONTINUED] rosuvastatin (CRESTOR) 20 MG tablet Take 1 tablet (20 mg total) by mouth at bedtime.   No facility-administered encounter medications on file as of 05/01/2023.     Lab Results  Component Value Date   WBC 9.8 09/22/2022   HGB 13.0 09/22/2022   HCT 37.6 09/22/2022   PLT 340 09/22/2022   GLUCOSE 112 (H) 04/25/2023   CHOL 109 04/25/2023   TRIG 111.0 04/25/2023   HDL 44.80 04/25/2023   LDLCALC 42 04/25/2023   ALT 18 04/25/2023   AST 20 04/25/2023   NA 139 04/25/2023   K 4.0 04/25/2023   CL 103 04/25/2023   CREATININE 0.85 04/25/2023   BUN 13 04/25/2023    CO2 28 04/25/2023   TSH 1.20 09/22/2022   HGBA1C 6.3 04/25/2023       Assessment & Plan:  Visit for screening mammogram -     3D Screening Mammogram, Left and Right; Future  Hypercholesterolemia Assessment & Plan: On crestor.  Follow lipid panel and liver function tests.    Orders: -     Lipid panel; Future -     Hepatic function panel; Future  Hyperglycemia -     Hemoglobin A1c; Future  Hypertension, unspecified type Assessment & Plan: Continue losartan and amlodipine. Pressures as outlined.  No change in medication.  Follow pressures.  Follow metabolic panel.   Orders: -     CBC with Differential/Platelet; Future -     TSH; Future -     Basic metabolic panel; Future -     amLODIPine Besylate; Take 1 tablet (2.5 mg total) by mouth daily. In am  Dispense: 90 tablet; Refill: 3  Dizziness -     amLODIPine Besylate; Take 1 tablet (2.5 mg total) by mouth daily. In am  Dispense: 90 tablet; Refill: 3  Gastroesophageal reflux disease, unspecified whether esophagitis present Assessment & Plan: Doing well on protonix 20mg  q day.  Continue.  Follow.    Cervicalgia Assessment & Plan: Some neck discomfort.  Aggravated when exercising.  Getting better.  Will notify me if does not continue to improve.     Polyp of colon, unspecified part of colon, unspecified type Assessment & Plan: Colonoscopy 2019 - normal.  Non bleeding internal hemorrhoids.  Major depressive disorder, recurrent, severe without psychotic features [facility]) Assessment & Plan: Followed by psych.  Doing better.  Continue lexapro.     Type 2 diabetes mellitus with hyperglycemia, without long-term current use of insulin (HCC) Assessment & Plan: Follow met b and a1c. Exercising.     Hemorrhoids, unspecified hemorrhoid type Assessment & Plan: Reports using prep H.  Symptoms improved.  Does not feel needs any further intervention at this time.  Will notify me if does not completely heal.     Other  orders -     Pantoprazole Sodium; Take 1 tablet (20 mg total) by mouth daily.  Dispense: 90 tablet; Refill: 1 -     Rosuvastatin Calcium; Take 1 tablet (20 mg total) by mouth at bedtime.  Dispense: 90 tablet; Refill: 3     Dale , MD

## 2023-05-04 ENCOUNTER — Other Ambulatory Visit: Payer: Self-pay | Admitting: Internal Medicine

## 2023-05-04 ENCOUNTER — Encounter: Payer: Self-pay | Admitting: Internal Medicine

## 2023-05-04 DIAGNOSIS — M542 Cervicalgia: Secondary | ICD-10-CM

## 2023-05-04 NOTE — Progress Notes (Signed)
Order placed for referral to [facility] 

## 2023-05-07 ENCOUNTER — Encounter: Payer: Self-pay | Admitting: Internal Medicine

## 2023-05-07 DIAGNOSIS — K649 Unspecified hemorrhoids: Secondary | ICD-10-CM | POA: Insufficient documentation

## 2023-05-07 NOTE — Assessment & Plan Note (Signed)
Reports using prep H.  Symptoms improved.  Does not feel needs any further intervention at this time.  Will notify me if does not completely heal.

## 2023-05-07 NOTE — Assessment & Plan Note (Signed)
Followed by psych.  Doing better.  Continue lexapro.   

## 2023-05-07 NOTE — Assessment & Plan Note (Signed)
Colonoscopy 2019 - normal.  Non bleeding internal hemorrhoids.  

## 2023-05-07 NOTE — Assessment & Plan Note (Signed)
On crestor.  Follow lipid panel and liver function tests.   

## 2023-05-07 NOTE — Assessment & Plan Note (Signed)
Some neck discomfort.  Aggravated when exercising.  Getting better.  Will notify me if does not continue to improve.

## 2023-05-07 NOTE — Assessment & Plan Note (Signed)
Follow met b and a1c. Exercising.

## 2023-05-07 NOTE — Assessment & Plan Note (Signed)
Continue losartan and amlodipine. Pressures as outlined.  No change in medication.  Follow pressures.  Follow metabolic panel.  

## 2023-05-11 DIAGNOSIS — M542 Cervicalgia: Secondary | ICD-10-CM | POA: Diagnosis not present

## 2023-05-14 ENCOUNTER — Ambulatory Visit (INDEPENDENT_AMBULATORY_CARE_PROVIDER_SITE_OTHER): Payer: Medicare PPO | Admitting: *Deleted

## 2023-05-14 DIAGNOSIS — E538 Deficiency of other specified B group vitamins: Secondary | ICD-10-CM | POA: Diagnosis not present

## 2023-05-14 MED ORDER — CYANOCOBALAMIN 1000 MCG/ML IJ SOLN
1000.0000 ug | Freq: Once | INTRAMUSCULAR | Status: AC
  Administered 2023-05-14: 1000 ug via INTRAMUSCULAR

## 2023-05-14 NOTE — Progress Notes (Signed)
Patient received B12 injection in right arm. Tolerated it well with no complaints or concerns.

## 2023-05-15 DIAGNOSIS — M542 Cervicalgia: Secondary | ICD-10-CM | POA: Diagnosis not present

## 2023-05-17 DIAGNOSIS — M542 Cervicalgia: Secondary | ICD-10-CM | POA: Diagnosis not present

## 2023-05-22 DIAGNOSIS — M542 Cervicalgia: Secondary | ICD-10-CM | POA: Diagnosis not present

## 2023-05-22 NOTE — Telephone Encounter (Signed)
Can schedule an appt to discuss 

## 2023-05-23 NOTE — Telephone Encounter (Signed)
Pt scheduled with Dr Scott 

## 2023-05-24 DIAGNOSIS — M542 Cervicalgia: Secondary | ICD-10-CM | POA: Diagnosis not present

## 2023-05-25 ENCOUNTER — Ambulatory Visit
Admission: RE | Admit: 2023-05-25 | Discharge: 2023-05-25 | Disposition: A | Discharge status: Home / Self Care | Payer: Medicare PPO | Source: Ambulatory Visit | Attending: Internal Medicine | Admitting: Internal Medicine

## 2023-05-25 ENCOUNTER — Ambulatory Visit: Payer: Medicare PPO | Admitting: Internal Medicine

## 2023-05-25 ENCOUNTER — Encounter: Payer: Self-pay | Admitting: Internal Medicine

## 2023-05-25 VITALS — BP 136/78 | HR 88 | Temp 97.9°F | Resp 16 | Ht 63.0 in | Wt 136.0 lb

## 2023-05-25 DIAGNOSIS — M549 Dorsalgia, unspecified: Secondary | ICD-10-CM | POA: Insufficient documentation

## 2023-05-25 DIAGNOSIS — M546 Pain in thoracic spine: Secondary | ICD-10-CM | POA: Diagnosis not present

## 2023-05-25 DIAGNOSIS — I10 Essential (primary) hypertension: Secondary | ICD-10-CM | POA: Diagnosis not present

## 2023-05-25 DIAGNOSIS — M47812 Spondylosis without myelopathy or radiculopathy, cervical region: Secondary | ICD-10-CM | POA: Diagnosis not present

## 2023-05-25 DIAGNOSIS — F3341 Major depressive disorder, recurrent, in partial remission: Secondary | ICD-10-CM

## 2023-05-25 DIAGNOSIS — M542 Cervicalgia: Secondary | ICD-10-CM | POA: Diagnosis not present

## 2023-05-25 DIAGNOSIS — R519 Headache, unspecified: Secondary | ICD-10-CM | POA: Diagnosis not present

## 2023-05-25 DIAGNOSIS — E1165 Type 2 diabetes mellitus with hyperglycemia: Secondary | ICD-10-CM

## 2023-05-25 DIAGNOSIS — M47814 Spondylosis without myelopathy or radiculopathy, thoracic region: Secondary | ICD-10-CM | POA: Diagnosis not present

## 2023-05-25 NOTE — Progress Notes (Unsigned)
Subjective:    Patient ID: Angelica Robertson, female    DOB: 1946/04/08, 77 y.o.   MRN: [id number]  Patient here for  Chief Complaint  Patient presents with   Back Pain    HPI Work in appt - persistent neck pain.  Has had chronic neck issues.  Has been followed recently- [facility].  Reports no progress in pain levels or frequency of headaches since starting PT. They have used manual techniques, including dry needling.  No improvement. No radicular symptoms.  Persistent increased neck pain and this contributes to headaches.  Takes tylenol.  Does help some (temporarily). Tolerating stress. Does report over the last month, increased mid back pain.  No injury or trauma.    Past Medical History:  Diagnosis Date   Depression 2000   Heart murmur    Hypercholesterolemia Osteoporosis   Hyperlipemia    Past Surgical History:  Procedure Laterality Date   ABDOMINAL HYSTERECTOMY  1980   COLONOSCOPY WITH PROPOFOL N/A 03/05/2018   Procedure: COLONOSCOPY WITH PROPOFOL;  Surgeon: Toledo, Boykin Nearing, MD;  Location: [facility] END[facility];  Service: Endoscopy;  Laterality: N/A;   COLONOSCOPY WITH PROPOFOL N/A 04/23/2023   Procedure: COLONOSCOPY WITH PROPOFOL;  Surgeon: Regis Bill, MD;  Location: [facility] END[facility]COPY;  Service: Endoscopy;  Laterality: N/A;   Family History  Problem Relation Age of Onset   Heart disease Mother    Diabetes Mother    Heart disease Father    Diabetes Father    Colon cancer Other    Congestive Heart Failure Brother    Colon cancer Brother    Mental illness Paternal Aunt    BRCA 1/2 Neg Hx    Breast cancer Neg Hx    Cowden syndrome Neg Hx    DES usage Neg Hx    Endometrial cancer Neg Hx    Li-Fraumeni syndrome Neg Hx    Ovarian cancer Neg Hx    Social History   Socioeconomic History   Marital status: Married    Spouse name: robert   Number of children: 2   Years of education: Not on file   Highest education level: 12th grade  Occupational History   Not on file   Tobacco Use   Smoking status: Never   Smokeless tobacco: Never  Vaping Use   Vaping Use: Never used  Substance and Sexual Activity   Alcohol use: Yes    Alcohol/week: 0.0 standard drinks of alcohol    Comment: occasionally - socially during the summer a glass of wine or beer   Drug use: No   Sexual activity: Yes  Other Topics Concern   Not on file  Social History Narrative   Not on file   Social Determinants of Health   Financial Resource Strain: Low Risk  (04/29/2023)   Overall Financial Resource Strain (CARDIA)    Difficulty of Paying Living Expenses: Not hard at all  Food Insecurity: No Food Insecurity (04/29/2023)   Hunger Vital Sign    Worried About Running Out of Food in the Last Year: Never true    Ran Out of Food in the Last Year: Never true  Transportation Needs: No Transportation Needs (04/29/2023)   PRAPARE - Administrator, Civil Service (Medical): No    Lack of Transportation (Non-Medical): No  Physical Activity: Insufficiently Active (04/29/2023)   Exercise Vital Sign    Days of Exercise per Week: 3 days    Minutes of Exercise per Session: 30 min  Stress: No  Stress Concern Present (04/29/2023)   [employer] of Occupational Health - Occupational Stress Questionnaire    Feeling of Stress : Not at all  Social Connections: Socially Integrated (04/29/2023)   Social Connection and Isolation Panel [NHANES]    Frequency of Communication with Friends and Family: Twice a week    Frequency of Social Gatherings with Friends and Family: Twice a week    Attends Religious Services: More than 4 times per year    Active Member of [employer] or Organizations: Yes    Attends Engineer, structural: More than 4 times per year    Marital Status: Married     Review of Systems  Constitutional:  Negative for appetite change and unexpected weight change.  HENT:  Negative for congestion and sinus pressure.   Respiratory:  Negative for cough, chest tightness and  shortness of breath.   Cardiovascular:  Negative for chest pain, palpitations and leg swelling.  Gastrointestinal:  Negative for abdominal pain, diarrhea, nausea and vomiting.  Genitourinary:  Negative for difficulty urinating and dysuria.  Musculoskeletal:  Positive for neck pain. Negative for myalgias.  Skin:  Negative for color change and rash.  Neurological:  Negative for dizziness.       Headaches as outlined.   Psychiatric/Behavioral:  Negative for agitation and dysphoric mood.        Objective:     BP 136/78   Pulse 88   Temp 97.9 F (36.6 C)   Resp 16   Ht 5\' 3"  (1.6 m)   Wt 136 lb (61.7 kg)   LMP 11/27/1979   SpO2 98%   BMI 24.09 kg/m  Wt Readings from Last 3 Encounters:  05/25/23 136 lb (61.7 kg)  05/01/23 138 lb (62.6 kg)  04/23/23 135 lb 12.8 oz (61.6 kg)    Physical Exam Vitals reviewed.  Constitutional:      General: She is not in acute distress.    Appearance: Normal appearance.  HENT:     Head: Normocephalic and atraumatic.     Right Ear: External ear normal.     Left Ear: External ear normal.  Eyes:     General: No scleral icterus.       Right eye: No discharge.        Left eye: No discharge.     Conjunctiva/sclera: Conjunctivae normal.  Neck:     Thyroid: No thyromegaly.  Cardiovascular:     Rate and Rhythm: Normal rate and regular rhythm.  Pulmonary:     Effort: No respiratory distress.     Breath sounds: Normal breath sounds. No wheezing.  Abdominal:     General: Bowel sounds are normal.     Palpations: Abdomen is soft.     Tenderness: There is no abdominal tenderness.  Musculoskeletal:        General: No swelling or tenderness.     Cervical back: Neck supple. No tenderness.     Comments: Increased pain with rotation of her head.   Lymphadenopathy:     Cervical: No cervical adenopathy.  Skin:    Findings: No erythema or rash.  Neurological:     Mental Status: She is alert.  Psychiatric:        Mood and Affect: Mood normal.         Behavior: Behavior normal.      Outpatient Encounter Medications as of 05/25/2023  Medication Sig   ALPRAZolam (XANAX) 0.25 MG tablet Take 1 tablet (0.25 mg total) by mouth as directed. Take daily  as needed for severe anxiety attacks ,please limit use   amLODipine (NORVASC) 2.5 MG tablet Take 1 tablet (2.5 mg total) by mouth daily. In am   calcium carbonate (OS-CAL) 600 MG TABS tablet Take 600 mg by mouth 2 (two) times daily with a meal.   escitalopram (LEXAPRO) 10 MG tablet Take 1 tablet (10 mg total) by mouth daily with breakfast.   losartan (COZAAR) 25 MG tablet TAKE 1 TABLET BY MOUTH ONCE DAILY   mirtazapine (REMERON) 15 MG tablet Take 1.5 tablets (22.5 mg total) by mouth at bedtime.   pantoprazole (PROTONIX) 20 MG tablet Take 1 tablet (20 mg total) by mouth daily.   rosuvastatin (CRESTOR) 20 MG tablet Take 1 tablet (20 mg total) by mouth at bedtime.   VITAMIN D PO Take by mouth daily.   No facility-administered encounter medications on file as of 05/25/2023.     Lab Results  Component Value Date   WBC 9.8 09/22/2022   HGB 13.0 09/22/2022   HCT 37.6 09/22/2022   PLT 340 09/22/2022   GLUCOSE 112 (H) 04/25/2023   CHOL 109 04/25/2023   TRIG 111.0 04/25/2023   HDL 44.80 04/25/2023   LDLCALC 42 04/25/2023   ALT 18 04/25/2023   AST 20 04/25/2023   NA 139 04/25/2023   K 4.0 04/25/2023   CL 103 04/25/2023   CREATININE 0.85 04/25/2023   BUN 13 04/25/2023   CO2 28 04/25/2023   TSH 1.20 09/22/2022   HGBA1C 6.3 04/25/2023    No results found.     Assessment & Plan:  Cervicalgia Assessment & Plan: Has had a problem with chronic neck issues.  Recent increased pain as outlined.  Has been going to PT. Reports no progress in pain levels or frequency of headaches since starting PT. They have used manual techniques, including dry needling.  No improvement. No radicular symptoms.  Persistent increased neck pain and this contributes to headaches.  Takes tylenol.  Does help some  (temporarily).  Given persistent pain, will xray c-spine.  May need MRI/NSU/ortho - evaluation.    Orders: -     DG Cervical Spine 2 or 3 views; Future  Midline thoracic back pain, unspecified chronicity Assessment & Plan: Noticed mid back pain one month ago.  No known injury or trauma.  Was going to the gym.  Discussed given persistent pain, will xray back.  Further w/up pending results.    Orders: -     DG Thoracic Spine 2 View; Future  Acute nonintractable headache, unspecified headache type Assessment & Plan: Previously saw neurology.  Recommended magnesium.  Feel headache now aggravated by her neck issues.  Continue tylenol.  Purse neck evaluation. Follow    Hypertension, unspecified type Assessment & Plan: Continue losartan and amlodipine. Pressures as outlined.  No change in medication.  Follow pressures.  Follow metabolic panel.    MDD (major depressive disorder), recurrent, in partial remission (HCC) Assessment & Plan: Being followed by psychiatry.  Doing well on current regimen.  Follow.    Type 2 diabetes mellitus with hyperglycemia, without long-term current use of insulin (HCC) Assessment & Plan: Follow met b and a1c. Exercising.        Dale [place], MD

## 2023-05-27 ENCOUNTER — Encounter: Payer: Self-pay | Admitting: Internal Medicine

## 2023-05-27 NOTE — Assessment & Plan Note (Signed)
Follow met b and a1c. Exercising.   

## 2023-05-27 NOTE — Assessment & Plan Note (Signed)
Being followed by psychiatry.  Doing well on current regimen.  Follow.  

## 2023-05-27 NOTE — Assessment & Plan Note (Signed)
Noticed mid back pain one month ago.  No known injury or trauma.  Was going to the gym.  Discussed given persistent pain, will xray back.  Further w/up pending results.

## 2023-05-27 NOTE — Assessment & Plan Note (Signed)
Has had a problem with chronic neck issues.  Recent increased pain as outlined.  Has been going to PT. Reports no progress in pain levels or frequency of headaches since starting PT. They have used manual techniques, including dry needling.  No improvement. No radicular symptoms.  Persistent increased neck pain and this contributes to headaches.  Takes tylenol.  Does help some (temporarily).  Given persistent pain, will xray c-spine.  May need MRI/NSU/ortho - evaluation.

## 2023-05-27 NOTE — Assessment & Plan Note (Signed)
Previously saw neurology.  Recommended magnesium.  Feel headache now aggravated by her neck issues.  Continue tylenol.  Purse neck evaluation. Follow

## 2023-05-27 NOTE — Assessment & Plan Note (Signed)
Continue losartan and amlodipine. Pressures as outlined.  No change in medication.  Follow pressures.  Follow metabolic panel.  

## 2023-05-28 DIAGNOSIS — F4323 Adjustment disorder with mixed anxiety and depressed mood: Secondary | ICD-10-CM | POA: Diagnosis not present

## 2023-05-28 DIAGNOSIS — F3342 Major depressive disorder, recurrent, in full remission: Secondary | ICD-10-CM | POA: Diagnosis not present

## 2023-05-30 ENCOUNTER — Other Ambulatory Visit: Payer: Self-pay | Admitting: Internal Medicine

## 2023-05-30 DIAGNOSIS — M542 Cervicalgia: Secondary | ICD-10-CM

## 2023-05-30 NOTE — Progress Notes (Signed)
Order placed for referral to Dr Chasnis.   

## 2023-06-05 DIAGNOSIS — M5412 Radiculopathy, cervical region: Secondary | ICD-10-CM | POA: Diagnosis not present

## 2023-06-05 DIAGNOSIS — M5030 Other cervical disc degeneration, unspecified cervical region: Secondary | ICD-10-CM | POA: Diagnosis not present

## 2023-06-12 ENCOUNTER — Other Ambulatory Visit: Payer: Self-pay | Admitting: Family Medicine

## 2023-06-12 DIAGNOSIS — M5030 Other cervical disc degeneration, unspecified cervical region: Secondary | ICD-10-CM

## 2023-06-12 DIAGNOSIS — M5412 Radiculopathy, cervical region: Secondary | ICD-10-CM

## 2023-06-15 ENCOUNTER — Ambulatory Visit
Admission: RE | Admit: 2023-06-15 | Discharge: 2023-06-15 | Disposition: A | Discharge status: Home / Self Care | Payer: Medicare PPO | Source: Ambulatory Visit | Attending: Family Medicine | Admitting: Family Medicine

## 2023-06-15 DIAGNOSIS — M5030 Other cervical disc degeneration, unspecified cervical region: Secondary | ICD-10-CM

## 2023-06-15 DIAGNOSIS — M5412 Radiculopathy, cervical region: Secondary | ICD-10-CM | POA: Diagnosis not present

## 2023-06-18 ENCOUNTER — Ambulatory Visit (INDEPENDENT_AMBULATORY_CARE_PROVIDER_SITE_OTHER): Payer: Medicare PPO

## 2023-06-18 DIAGNOSIS — E538 Deficiency of other specified B group vitamins: Secondary | ICD-10-CM | POA: Diagnosis not present

## 2023-06-18 MED ORDER — CYANOCOBALAMIN 1000 MCG/ML IJ SOLN
1000.00 ug | Freq: Once | INTRAMUSCULAR | Status: AC
Start: 2023-06-18 — End: 2023-06-18
  Administered 2023-06-18: 1000 ug via INTRAMUSCULAR

## 2023-06-18 NOTE — Progress Notes (Signed)
Pt presented for their vitamin B12 injection. Pt was identified through two identifiers. Pt tolerated shot well in their left  deltoid.  

## 2023-06-20 ENCOUNTER — Encounter: Payer: Self-pay | Admitting: Internal Medicine

## 2023-06-20 NOTE — Telephone Encounter (Signed)
Advised patient that since she is established with Dr Yves Dill office she needs to reach out to them and have the MD covering treat since they are seeing her and did not want to conflict with pain contract, etc. Pt gave verbal understanding

## 2023-06-26 ENCOUNTER — Ambulatory Visit
Admission: RE | Admit: 2023-06-26 | Discharge: 2023-06-26 | Disposition: A | Discharge status: Home / Self Care | Payer: Medicare PPO | Source: Ambulatory Visit | Attending: Internal Medicine | Admitting: Internal Medicine

## 2023-06-26 DIAGNOSIS — Z1231 Encounter for screening mammogram for malignant neoplasm of breast: Secondary | ICD-10-CM | POA: Diagnosis not present

## 2023-06-27 ENCOUNTER — Other Ambulatory Visit: Payer: Self-pay | Admitting: Internal Medicine

## 2023-06-27 DIAGNOSIS — N6489 Other specified disorders of breast: Secondary | ICD-10-CM

## 2023-06-27 DIAGNOSIS — R928 Other abnormal and inconclusive findings on diagnostic imaging of breast: Secondary | ICD-10-CM

## 2023-06-29 DIAGNOSIS — M5020 Other cervical disc displacement, unspecified cervical region: Secondary | ICD-10-CM | POA: Diagnosis not present

## 2023-06-29 DIAGNOSIS — M5412 Radiculopathy, cervical region: Secondary | ICD-10-CM | POA: Diagnosis not present

## 2023-06-29 DIAGNOSIS — M5030 Other cervical disc degeneration, unspecified cervical region: Secondary | ICD-10-CM | POA: Diagnosis not present

## 2023-07-02 ENCOUNTER — Ambulatory Visit
Admission: RE | Admit: 2023-07-02 | Discharge: 2023-07-02 | Disposition: A | Discharge status: Home / Self Care | Payer: Medicare PPO | Source: Ambulatory Visit | Attending: Internal Medicine | Admitting: Internal Medicine

## 2023-07-02 DIAGNOSIS — R928 Other abnormal and inconclusive findings on diagnostic imaging of breast: Secondary | ICD-10-CM | POA: Insufficient documentation

## 2023-07-02 DIAGNOSIS — N6489 Other specified disorders of breast: Secondary | ICD-10-CM

## 2023-07-02 DIAGNOSIS — R92322 Mammographic fibroglandular density, left breast: Secondary | ICD-10-CM | POA: Diagnosis not present

## 2023-07-11 ENCOUNTER — Encounter (INDEPENDENT_AMBULATORY_CARE_PROVIDER_SITE_OTHER): Payer: Self-pay

## 2023-07-16 ENCOUNTER — Telehealth: Payer: Self-pay

## 2023-07-16 NOTE — Telephone Encounter (Signed)
Pt was notified.  

## 2023-07-16 NOTE — Telephone Encounter (Signed)
pt states that she doesn't know if it is her medications making her feel nauses, headaches, sweating   pt was last seen on 5-14 next appt 10-15

## 2023-07-19 ENCOUNTER — Ambulatory Visit: Payer: Medicare PPO

## 2023-07-19 DIAGNOSIS — M5412 Radiculopathy, cervical region: Secondary | ICD-10-CM | POA: Diagnosis not present

## 2023-07-19 DIAGNOSIS — M5020 Other cervical disc displacement, unspecified cervical region: Secondary | ICD-10-CM | POA: Diagnosis not present

## 2023-07-23 ENCOUNTER — Telehealth (HOSPITAL_COMMUNITY): Payer: Self-pay

## 2023-07-23 NOTE — Telephone Encounter (Signed)
It is very hard to say if her medications are contributing to her current symptoms, she has been on these medications for a while.  There has been no new changes.  I will recommend she get herself evaluated by primary care.  She will need a sooner visit to see me so we can make more medication readjustments.  If she is having significant headaches nausea sweats she may have to go to the nearest urgent care.  Please have patient contact staff to schedule a sooner appointment for medication changes/management.

## 2023-07-23 NOTE — Telephone Encounter (Signed)
pt left a message that she is waking up at night can having heat sensations and she can not go back to sleep. she also states that in the morning after she takes her depression medication she gets a headache.   Pt last seen on 05-14 next appt 10-15

## 2023-07-25 ENCOUNTER — Telehealth: Payer: Self-pay | Admitting: Internal Medicine

## 2023-07-25 NOTE — Telephone Encounter (Signed)
Patient would like to speak with CMA. Patient had a Cortizone injection last week. She has a bad headache, and would like to talk to someone about it. Her number is [phone].

## 2023-07-25 NOTE — Telephone Encounter (Signed)
pt had already been notified but pt states she has not contacted her pcp yet. pt was advised to call her pcp. pt was also told that she was put on the waitlist incase someone canceled

## 2023-07-26 ENCOUNTER — Encounter (INDEPENDENT_AMBULATORY_CARE_PROVIDER_SITE_OTHER): Payer: Self-pay

## 2023-07-26 ENCOUNTER — Ambulatory Visit (INDEPENDENT_AMBULATORY_CARE_PROVIDER_SITE_OTHER): Payer: Medicare PPO

## 2023-07-26 DIAGNOSIS — E538 Deficiency of other specified B group vitamins: Secondary | ICD-10-CM | POA: Diagnosis not present

## 2023-07-26 MED ORDER — CYANOCOBALAMIN 1000 MCG/ML IJ SOLN
1000.0000 ug | Freq: Once | INTRAMUSCULAR | Status: AC
Start: 2023-07-26 — End: 2023-07-26
  Administered 2023-07-26: 1000 ug via INTRAMUSCULAR

## 2023-07-26 NOTE — Telephone Encounter (Signed)
Reviewed.  Noted.  Agree with f/u with Dr Yves Dill office, but if she is having continued increased headache, she needs to be evaluated.

## 2023-07-26 NOTE — Progress Notes (Signed)
After obtaining consent, and per orders of Dr. Lorin Picket, injection of B12 given by Kristie Cowman in right deltoid. Patient tolerated injection well.

## 2023-07-26 NOTE — Telephone Encounter (Signed)
Called patient to get more info. Advised we were out of office yesterday PM. 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 Since injection, head aches have been a little worse. They are persistent. She did call Dr Yves Dill and per note review- trying to determine if the headaches are coming from the steroid or if this is a post injection flare that needs to be treated with pred taper. They prescribed 10 tramadol to start off with to see if this would help since tylenol 650 mg was not working. Discussed this with pt again. Patient said that she did pick the tramadol up but has only taken 1 or 2 because she gets nervous about taking meds. She felt fine after taking the tramadol- no adverse effects. She is going to try using them today and update [facility] office prior to the weekend.

## 2023-07-27 NOTE — Telephone Encounter (Signed)
Pt aware. Says she took the tramadol and seemed to help. Will f/u with Dr Yves Dill if does not continue to improve.

## 2023-07-31 ENCOUNTER — Ambulatory Visit (INDEPENDENT_AMBULATORY_CARE_PROVIDER_SITE_OTHER): Payer: Medicare PPO | Admitting: Psychiatry

## 2023-07-31 ENCOUNTER — Encounter: Payer: Self-pay | Admitting: Psychiatry

## 2023-07-31 VITALS — BP 174/81 | HR 98 | Temp 98.2°F | Ht 63.0 in | Wt 126.8 lb

## 2023-07-31 DIAGNOSIS — Z9189 Other specified personal risk factors, not elsewhere classified: Secondary | ICD-10-CM

## 2023-07-31 DIAGNOSIS — F332 Major depressive disorder, recurrent severe without psychotic features: Secondary | ICD-10-CM | POA: Diagnosis not present

## 2023-07-31 DIAGNOSIS — F411 Generalized anxiety disorder: Secondary | ICD-10-CM

## 2023-07-31 DIAGNOSIS — R519 Headache, unspecified: Secondary | ICD-10-CM | POA: Diagnosis not present

## 2023-07-31 MED ORDER — ARIPIPRAZOLE 2 MG PO TABS: 2.0000 mg | ORAL_TABLET | Freq: Every morning | ORAL | 1 refills | Status: DC

## 2023-07-31 NOTE — Progress Notes (Unsigned)
This note is not being shared with the patient for the following reason: To prevent harm (release of this note would result in harm to the life or physical safety of the patient or another). BH MD OP Progress Note  07/31/2023 5:26 PM Angelica Robertson  MRN:  [id number]  Chief Complaint:  Chief Complaint  Patient presents with   Follow-up   Anxiety   Depression   Medication Refill   HPI: Angelica Robertson is a 77 year old Caucasian female, married, retired, lives in [place], has a history of MDD, GAD, hyperlipidemia, arthritis was evaluated in office today.  Patient today presented along with her son Angelica Robertson who provided collateral information.  According to the patient as well as son patient has been decompensating with regards to her mood since the past several weeks.  Patient developed headaches a few weeks ago and her primary care provider did imaging and referred her to Dr.Chasnis.  Patient reports that she got a nerve block for pain, was diagnosed with possible bulging disk of her neck.  She continues to have headaches and that bothers her.  She reports she also has other stressors.  Her husband had to go to the emergency department recently due to medical issues although currently recovering.  Her brother-in-law is very sick.  Two of her friends at church passed away.  Hence there has been a lot of situational stressors.  According to son ' once she has any kind of adversity it is hard for her to come out of it and cope with it'.  She stops doing any kind of activities when she gets like this.  Son reports he checks in on her on a daily basis.   Patient  has been struggling with significant sadness, low energy, anhedonia, anxiety, low motivation, concentration problems.  Patient also endorsed passive thoughts of 'it would have been better if I were dead' although she denied any active suicidality or intent.  Patient reports she sleeps around 6 hours at night.  She does have night sweats which wakes  her up in the middle of the night however she reports she usually wakes up at around 4 or 4:30 AM and goes to bed at 10 PM.  She is not sure what could be causing her night sweats.  There has been some changes with her medications recently however most of those medications were for pain management.  She was prescribed tramadol for pain however she has not been using it.  She does use methocarbamol for muscle relaxation on and off however has been limiting use.  Patient has not been very compliant with therapy however is motivated to restart therapy.  She used to see Angelica Robertson.  Patient is compliant on the mirtazapine, Lexapro.  Visit Diagnosis:    ICD-10-CM   1. Severe episode of recurrent major depressive disorder, without psychotic features (HCC)  F33.2 ARIPiprazole (ABILIFY) 2 MG tablet    2. Generalized anxiety disorder  F41.1     3. At risk for prolonged QT interval syndrome  Z91.89 EKG 12-Lead      Past Psychiatric History: I have reviewed past psychiatric history from progress note on 01/12/2020.  Patient attempted TMS-07/17/2022-could not tolerate it due to side effects of headaches.  Past Medical History:  Past Medical History:  Diagnosis Date   Depression 2000   Heart murmur    Hypercholesterolemia Osteoporosis   Hyperlipemia     Past Surgical History:  Procedure Laterality Date   ABDOMINAL HYSTERECTOMY  1980   COLONOSCOPY WITH PROPOFOL N/A 03/05/2018   Procedure: COLONOSCOPY WITH PROPOFOL;  Surgeon: Toledo, Boykin Nearing, MD;  Location: [facility] ENDOSCOPY;  Service: Endoscopy;  Laterality: N/A;   COLONOSCOPY WITH PROPOFOL N/A 04/23/2023   Procedure: COLONOSCOPY WITH PROPOFOL;  Surgeon: Regis Bill, MD;  Location: [facility] ENDOSCOPY;  Service: Endoscopy;  Laterality: N/A;    Family Psychiatric History: I have reviewed family psychiatric history from progress note on 01/12/2020.  Family History:  Family History  Problem Relation Age of Onset   Heart disease Mother     Diabetes Mother    Heart disease Father    Diabetes Father    Colon cancer Other    Congestive Heart Failure Brother    Colon cancer Brother    Mental illness Paternal Aunt    BRCA 1/2 Neg Hx    Breast cancer Neg Hx    Cowden syndrome Neg Hx    DES usage Neg Hx    Endometrial cancer Neg Hx    Li-Fraumeni syndrome Neg Hx    Ovarian cancer Neg Hx     Social History: I have reviewed social history from progress note on 01/12/2020. Social History   Socioeconomic History   Marital status: Married    Spouse name: Angelica Robertson   Number of children: 2   Years of education: Not on file   Highest education level: 12th grade  Occupational History   Not on file  Tobacco Use   Smoking status: Never   Smokeless tobacco: Never  Vaping Use   Vaping status: Never Used  Substance and Sexual Activity   Alcohol use: Yes    Alcohol/week: 0.0 standard drinks of alcohol    Comment: occasionally - socially during the summer a glass of wine or beer   Drug use: No   Sexual activity: Yes  Other Topics Concern   Not on file  Social History Narrative   Not on file   Social Determinants of Health   Financial Resource Strain: Low Risk  (04/29/2023)   Overall Financial Resource Strain (CARDIA)    Difficulty of Paying Living Expenses: Not hard at all  Food Insecurity: No Food Insecurity (04/29/2023)   Hunger Vital Sign    Worried About Running Out of Food in the Last Year: Never true    Ran Out of Food in the Last Year: Never true  Transportation Needs: No Transportation Needs (04/29/2023)   PRAPARE - Administrator, Civil Service (Medical): No    Lack of Transportation (Non-Medical): No  Physical Activity: Insufficiently Active (04/29/2023)   Exercise Vital Sign    Days of Exercise per Week: 3 days    Minutes of Exercise per Session: 30 min  Stress: No Stress Concern Present (04/29/2023)   [employer] of Occupational Health - Occupational Stress Questionnaire    Feeling of Stress :  Not at all  Social Connections: Socially Integrated (04/29/2023)   Social Connection and Isolation Panel [NHANES]    Frequency of Communication with Friends and Family: Twice a week    Frequency of Social Gatherings with Friends and Family: Twice a week    Attends Religious Services: More than 4 times per year    Active Member of [employer] or Organizations: Yes    Attends Banker Meetings: More than 4 times per year    Marital Status: Married    Allergies: No Known Allergies  Metabolic Disorder Labs: Lab Results  Component Value Date   HGBA1C 6.3 04/25/2023  No results found for: "PROLACTIN" Lab Results  Component Value Date   CHOL 109 04/25/2023   TRIG 111.0 04/25/2023   HDL 44.80 04/25/2023   CHOLHDL 2 04/25/2023   VLDL 22.2 04/25/2023   LDLCALC 42 04/25/2023   LDLCALC 67 12/26/2022   Lab Results  Component Value Date   TSH 1.20 09/22/2022   TSH 4.46 03/21/2022    Therapeutic Level Labs: No results found for: "LITHIUM" No results found for: "VALPROATE" No results found for: "CBMZ"  Current Medications: Current Outpatient Medications  Medication Sig Dispense Refill   ALPRAZolam (XANAX) 0.25 MG tablet Take 1 tablet (0.25 mg total) by mouth as directed. Take daily as needed for severe anxiety attacks ,please limit use 15 tablet 0   amLODipine (NORVASC) 2.5 MG tablet Take 1 tablet (2.5 mg total) by mouth daily. In am 90 tablet 3   ARIPiprazole (ABILIFY) 2 MG tablet Take 1 tablet (2 mg total) by mouth in the morning. 30 tablet 1   calcium carbonate (OS-CAL) 600 MG TABS tablet Take 600 mg by mouth 2 (two) times daily with a meal.     escitalopram (LEXAPRO) 10 MG tablet Take 1 tablet (10 mg total) by mouth daily with breakfast. 90 tablet 1   losartan (COZAAR) 25 MG tablet TAKE 1 TABLET BY MOUTH ONCE DAILY 90 tablet 1   methocarbamol (ROBAXIN) 500 MG tablet 1/2-1 po q day prn     mirtazapine (REMERON) 15 MG tablet Take 1.5 tablets (22.5 mg total) by mouth at  bedtime. 135 tablet 1   pantoprazole (PROTONIX) 20 MG tablet Take 1 tablet (20 mg total) by mouth daily. 90 tablet 1   rosuvastatin (CRESTOR) 20 MG tablet Take 1 tablet (20 mg total) by mouth at bedtime. 90 tablet 3   VITAMIN D PO Take by mouth daily.     diazepam (VALIUM) 5 MG tablet 1 po 30 minutes before ESI (Patient not taking: Reported on 07/31/2023)     traMADol (ULTRAM) 50 MG tablet Take by mouth. (Patient not taking: Reported on 07/31/2023)     No current facility-administered medications for this visit.     Musculoskeletal: Strength & Muscle Tone: within normal limits Gait & Station: normal Patient leans: N/A  Psychiatric Specialty Exam: Review of Systems  Psychiatric/Behavioral:  Positive for decreased concentration, dysphoric mood and sleep disturbance. The patient is nervous/anxious.     Blood pressure (!) 174/81, pulse 98, temperature 98.2 F (36.8 C), temperature source Skin, height 5\' 3"  (1.6 m), weight 126 lb 12.8 oz (57.5 kg), last menstrual period 11/27/1979.Body mass index is 22.46 kg/m.  General Appearance: Fairly Groomed  Eye Contact:  Fair  Speech:  Clear and Coherent  Volume:  Normal  Mood:  Anxious and Depressed  Affect:  Congruent  Thought Process:  Goal Directed and Descriptions of Associations: Intact  Orientation:  Full (Time, Place, and Person)  Thought Content: Rumination   Suicidal Thoughts:   Passive thoughts of ' ok if I die "  Homicidal Thoughts:  No  Memory:  Immediate;   Fair Recent;   Fair Remote;   Poor  Judgement:  Fair  Insight:  Fair  Psychomotor Activity:  Normal  Concentration:  Concentration: Poor and Attention Span: Poor  Recall:  Poor  Fund of Knowledge: Fair  Language: Fair  Akathisia:  No  Handed:  Right  AIMS (if indicated): not done  Assets:  Communication Skills Desire for Improvement Housing Social Support  ADL's:  Intact  Cognition: WNL  Sleep:  overall ok, reports night sweats   Screenings: AIMS     Flowsheet Row Office Visit from 06/20/2022 in [facility] Office Visit from 04/24/2022 in [facility]  AIMS Total Score 0 0      AUDIT    Flowsheet Row Admission (Discharged) from 05/12/2022 in [facility] Admission (Discharged) from 12/17/2019 in [facility]PATIENT [facility]ICINE Admission (Discharged) from [facility]  Alcohol Use Disorder Identification Test Final Score (AUDIT) 0 0 0      GAD-7    Flowsheet Row Office Visit from 07/31/2023 in [facility] Office Visit from 11/15/2022 in [facility] Office Visit from 09/20/2022 in [facility] Office Visit from 08/22/2022 in [facility] Office Visit from 08/18/2022 in [facility]  Total GAD-7 Score 15 1 6 20 7       Mini-Mental    Flowsheet Row Clinical Support from 02/27/2018 in [facility] Clinical Support from 02/02/2017 in [facility] Clinical Support from 02/03/2016 in [facility]  Total Score (max 30 points ) 30 30 30       PHQ2-9    Flowsheet Row Office Visit from 07/31/2023 in [facility] Office Visit from 05/25/2023 in [facility] Clinical Support from 02/12/2023 in [facility] Video Visit from 02/01/2023 in [facility] Office Visit from 11/15/2022 in [facility]  PHQ-2 Total Score 6 1 0 0 0  PHQ-9 Total Score 25 2 -- -- 0      Flowsheet Row Office Visit  from 07/31/2023 in [facility] Video Visit from 04/24/2023 in [facility] Admission (Discharged) from 04/23/2023 in [facility]  C-SSRS RISK CATEGORY Low Risk No Risk No Risk        Assessment and Plan: DAUNE SOBELMAN is a 77 year old Caucasian female, married, retired, lives in [place], has a history of MDD, anxiety, hyperlipidemia was evaluated in office today.  Patient with worsening mood symptoms, will benefit from medication readjustment, referral to partial hospitalization program-plan as noted below.  Plan MDD-unstable Continue mirtazapine 22.5 mg p.o. nightly Continue Lexapro 10 mg p.o. daily Start Abilify 2 mg p.o. daily in the morning I have referred patient for PHP-I have sent a referral to [facility], [place]. Patient advised to reestablish care with her therapist Angelica Robertson since.  GAD-unstable Mirtazapine 22.5 mg p.o. nightly Lexapro 10 mg p.o. daily Patient advised to reestablish care with therapist for CBT  At risk for prolonged QT syndrome-we will order EKG-patient to call [phone].  Collateral information was obtained from son as noted above.  Provided medication education including risk of tardive dyskinesia, weight gain side effects, effect on heart, metabolic syndrome.    Collaboration of Care: Collaboration of Care: Other I have referred patient for PHP-sent communication to [facility] in [place].  Patient/Guardian was advised Release of Information must be obtained prior to any record release in order to collaborate their care with an outside provider. Patient/Guardian was advised if they have not already done so to contact the registration department to sign all necessary forms in  order for [place] to release information regarding their care.   Consent: Patient/Guardian gives verbal consent for treatment and assignment of benefits for services  provided during this visit. Patient/Guardian expressed understanding and agreed to proceed.   I have spent atleast 40 minutes face to face with patient today which includes the time spent for preparing to see the patient ( e.g., review of test, records ), obtaining and to review and separately obtained history , ordering medications and test ,psychoeducation and supportive psychotherapy and care coordination,as well as documenting clinical information in electronic health record.  This note was generated in part or whole with voice recognition software. Voice recognition is usually quite accurate but there are transcription errors that can and very often do occur. I apologize for any typographical errors that were not detected and corrected.      Jomarie Longs, MD 08/01/2023, 8:11 AM

## 2023-07-31 NOTE — Patient Instructions (Addendum)
 Please call for EKG - [phone]  Aripiprazole Tablets What is this medication? ARIPIPRAZOLE (ay ri PIP ray zole) treats schizophrenia, bipolar I disorder, autism spectrum disorder, and Tourette disorder. It may also be used with antidepressant medications to treat depression. It works by balancing the levels of dopamine and serotonin in the brain, hormones that help regulate mood, behaviors, and thoughts. It belongs to a group of medications called antipsychotics. Antipsychotics can be used to treat several kinds of mental health conditions. This medicine may be used for other purposes; ask your health care provider or pharmacist if you have questions. COMMON BRAND NAME(S): Abilify What should I tell my care team before I take this medication? They need to know if you have any of these conditions: Dementia Diabetes Difficulty swallowing Have trouble controlling your muscles Heart disease History of irregular heartbeat History of stroke Low blood cell levels (white cells, red cells, and platelets) Low blood pressure Parkinson disease Seizures Suicidal thoughts, plans, or attempt by you or a family member Urges to engage in impulsive behaviors in ways that are unusual for you An unusual or allergic reaction to aripiprazole, other medications, foods, dyes, or preservatives Pregnant or trying to get pregnant Breastfeeding How should I use this medication? Take this medication by mouth with a glass of water. Take it as directed on the prescription label at the same time every day. You can take it with or without food. If it upsets your stomach, take it with food. Do not take your medication more often than directed. Keep taking it unless your care team tells you to stop. A special MedGuide will be given to you by the pharmacist with each prescription and refill. Be sure to read this information carefully each time. Talk to your care team about the use of this medication in children. While  it may be prescribed for children as young as 6 years for selected conditions, precautions do apply. Overdosage: If you think you have taken too much of this medicine contact a poison control center or emergency room at once. NOTE: This medicine is only for you. Do not share this medicine with others. What if I miss a dose? If you miss a dose, take it as soon as you can. If it is almost time for your next dose, take only that dose. Do not take double or extra doses. What may interact with this medication? Do not take this medication with any of the following: Brexpiprazole Cisapride Dextromethorphan; quinidine Dronedarone Metoclopramide Pimozide Quinidine Thioridazine This medication may also interact with the following: Antihistamines for allergy, cough, and cold Carbamazepine Certain medications for anxiety or sleep Certain medications for depression, such as amitriptyline, fluoxetine, paroxetine, or sertraline Certain medications for fungal infections, such as fluconazole, itraconazole, ketoconazole, posaconazole, or voriconazole Clarithromycin General anesthetics, such as halothane, isoflurane, methoxyflurane, or propofol Medications for Parkinson disease, such as levodopa Medications for blood pressure Medications for seizures Medications that relax muscles for surgery Opioid medications for pain Other medications that cause heart rhythm changes Phenothiazines, such as chlorpromazine or prochlorperazine Rifampin This list may not describe all possible interactions. Give your health care provider a list of all the medicines, herbs, non-prescription drugs, or dietary supplements you use. Also tell them if you smoke, drink alcohol, or use illegal drugs. Some items may interact with your medicine. What should I watch for while using this medication? Visit your care team for regular checks on your progress. Tell your care team if your symptoms do not start to  get better or if they  get worse. Do not suddenly stop taking this medication. You may develop a severe reaction. Your care team will tell you how much medication to take. If your care team wants you to stop the medication, the dose may be slowly lowered over time to avoid any side effects. Patients and their families should watch out for new or worsening depression or thoughts of suicide. Also watch out for sudden changes in feelings such as feeling anxious, agitated, panicky, irritable, hostile, aggressive, impulsive, severely restless, overly excited and hyperactive, or not being able to sleep. If this happens, especially at the beginning of antidepressant treatment or after a change in dose, call your care team. This medication may affect your coordination, reaction time, or judgment. Do not drive or operate machinery until you know how this medication affects you. Sit up or stand slowly to reduce the risk of dizzy or fainting spells. Drinking alcohol with this medication can increase the risk of these side effects. This medication can cause problems with controlling your body temperature. It can lower the response of your body to cold temperatures. If possible, stay indoors during cold weather. If you must go outdoors, wear warm clothes. It can also lower the response of your body to heat. Do not overheat. Do not over-exercise. Stay out of the sun when possible. If you must be in the sun, wear cool clothing. Drink plenty of water. If you have trouble controlling your body temperature, call your care team right away. This medication may cause dry eyes and blurred vision. If you wear contact lenses, you may feel some discomfort. Lubricating eye drops may help. See your care team if the problem does not go away or is severe. This medication may increase blood sugar. Ask your care team if changes in diet or medications are needed if you have diabetes. There have been reports of increased sexual urges or other strong urges such as  gambling while taking this medication. If you experience any of these while taking this medication, you should report this to your care team as soon as possible. What side effects may I notice from receiving this medication? Side effects that you should report to your care team as soon as possible: Allergic reactions--skin rash, itching, hives, swelling of the face, lips, tongue, or throat High blood sugar (hyperglycemia)--increased thirst or amount of urine, unusual weakness or fatigue, blurry vision High fever, stiff muscles, increased sweating, fast or irregular heartbeat, and confusion, which may be signs of neuroleptic malignant syndrome Low blood pressure--dizziness, feeling faint or lightheaded, blurry vision Pain or trouble swallowing Prolonged or painful erection Seizures Stroke--sudden numbness or weakness of the face, arm, or leg, trouble speaking, confusion, trouble walking, loss of balance or coordination, dizziness, severe headache, change in vision Uncontrolled and repetitive body movements, muscle stiffness or spasms, tremors or shaking, loss of balance or coordination, restlessness, shuffling walk, which may be signs of extrapyramidal symptoms (EPS) Thoughts of suicide or self-harm, worsening mood, feelings of depression Urges to engage in impulsive behaviors such as gambling, binge eating, sexual activity, or shopping in ways that are unusual for you Side effects that usually do not require medical attention (report these to your care team if they continue or are bothersome): Constipation Drowsiness Weight gain This list may not describe all possible side effects. Call your doctor for medical advice about side effects. You may report side effects to FDA at 1-800-FDA-1088. Where should I keep my medication? Keep out of the  reach of children and pets. Store at room temperature between 15 and 30 degrees C (59 and 86 degrees F). Throw away any unused medication after the expiration  date. NOTE: This sheet is a summary. It may not cover all possible information. If you have questions about this medicine, talk to your doctor, pharmacist, or health care provider.  2024 Elsevier/Gold Standard (2022-06-17 00:00:00)

## 2023-08-01 ENCOUNTER — Other Ambulatory Visit (HOSPITAL_COMMUNITY): Payer: Medicare PPO | Attending: Psychiatry | Admitting: Licensed Clinical Social Worker

## 2023-08-01 ENCOUNTER — Telehealth (HOSPITAL_COMMUNITY): Payer: Self-pay | Admitting: Licensed Clinical Social Worker

## 2023-08-01 DIAGNOSIS — F332 Major depressive disorder, recurrent severe without psychotic features: Secondary | ICD-10-CM | POA: Insufficient documentation

## 2023-08-01 NOTE — Telephone Encounter (Signed)
Cln emailed pt Teams invite for CCA at 2pm. Pt responded via email stating that the invite is for 6-7, not 2:00. Cln confirms that Teams says via email and on the Teams website that the assessment is scheduled from 2pm-3pm and responds to pt's email and assures pt that is when the appointment is. Pt then calls and asks to speak to "Kandee Keen." Cln asks if she is requesting to speak to the IOP counselor, and pt says "I spoke to her a little while ago." Cln informs her that she spoke to this cln. Pt repeats that her Teams invite is for 6:00 and cln responds that on this end, it is showing 2:00 and informs her that if she is unable to join at 2:00, cln will call and attempt to help her join. Pt then states her power has gone out. Cln offers to do the assessment in-person at the [place] office, to which pt states she lives in [place]. Cln assures pt that the assessment can be rescheduled if her power doesn't come back on and asks pt to call if this is the case.
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
## 2023-08-02 ENCOUNTER — Telehealth (HOSPITAL_COMMUNITY): Payer: Self-pay | Admitting: Licensed Clinical Social Worker

## 2023-08-03 ENCOUNTER — Other Ambulatory Visit (HOSPITAL_COMMUNITY): Payer: Medicare PPO

## 2023-08-05 ENCOUNTER — Encounter: Payer: Self-pay | Admitting: Internal Medicine

## 2023-08-06 ENCOUNTER — Other Ambulatory Visit (HOSPITAL_COMMUNITY): Payer: Medicare PPO

## 2023-08-06 ENCOUNTER — Ambulatory Visit
Admission: RE | Admit: 2023-08-06 | Discharge: 2023-08-06 | Disposition: A | Discharge status: Home / Self Care | Payer: Medicare PPO | Source: Ambulatory Visit | Attending: Internal Medicine | Admitting: Internal Medicine

## 2023-08-06 DIAGNOSIS — Z9189 Other specified personal risk factors, not elsewhere classified: Secondary | ICD-10-CM | POA: Diagnosis not present

## 2023-08-06 DIAGNOSIS — T50905D Adverse effect of unspecified drugs, medicaments and biological substances, subsequent encounter: Secondary | ICD-10-CM | POA: Diagnosis not present

## 2023-08-07 ENCOUNTER — Other Ambulatory Visit (HOSPITAL_COMMUNITY): Payer: Medicare PPO | Attending: Psychiatry

## 2023-08-07 ENCOUNTER — Other Ambulatory Visit (HOSPITAL_COMMUNITY): Payer: Medicare PPO

## 2023-08-07 ENCOUNTER — Encounter (HOSPITAL_COMMUNITY): Payer: Self-pay | Admitting: Professional

## 2023-08-07 ENCOUNTER — Other Ambulatory Visit (HOSPITAL_COMMUNITY): Payer: Medicare PPO | Admitting: Licensed Clinical Social Worker

## 2023-08-07 DIAGNOSIS — F332 Major depressive disorder, recurrent severe without psychotic features: Secondary | ICD-10-CM

## 2023-08-07 DIAGNOSIS — F419 Anxiety disorder, unspecified: Secondary | ICD-10-CM | POA: Diagnosis present

## 2023-08-07 DIAGNOSIS — F32A Depression, unspecified: Secondary | ICD-10-CM | POA: Diagnosis present

## 2023-08-07 DIAGNOSIS — Z7389 Other problems related to life management difficulty: Secondary | ICD-10-CM | POA: Diagnosis not present

## 2023-08-07 DIAGNOSIS — R4589 Other symptoms and signs involving emotional state: Secondary | ICD-10-CM | POA: Insufficient documentation

## 2023-08-07 DIAGNOSIS — F411 Generalized anxiety disorder: Secondary | ICD-10-CM | POA: Insufficient documentation

## 2023-08-07 DIAGNOSIS — Z79899 Other long term (current) drug therapy: Secondary | ICD-10-CM | POA: Diagnosis not present

## 2023-08-07 DIAGNOSIS — F3341 Major depressive disorder, recurrent, in partial remission: Secondary | ICD-10-CM | POA: Diagnosis not present

## 2023-08-07 NOTE — Telephone Encounter (Signed)
Pt returning Angelica Robertson call

## 2023-08-07 NOTE — Telephone Encounter (Signed)
Bethann Berkshire, please call her and confirm she is ok.  I can work her in (see me about this).  Thanks.

## 2023-08-07 NOTE — Psych (Signed)
Active     OP Depression     LTG: Reduce frequency, intensity, and duration of depression symptoms so that daily functioning is improved (Initial)     Start:  08/07/23    Expected End:  11/07/23         LTG: Increase coping skills to manage depression and improve ability to perform daily activities (Initial)     Start:  08/07/23    Expected End:  11/07/23         STG: Fannie Knee will attend at least 80% of scheduled PHP sessions    (Initial)     Start:  08/07/23    Expected End:  11/07/23         STG: Fannie Knee will complete at least 80% of assigned homework  (Initial)     Start:  08/07/23    Expected End:  11/07/23         STG: Temaka will identify cognitive patterns and beliefs that support depression (Initial)     Start:  08/07/23    Expected End:  11/07/23         Encourage Fannie Knee to participate in recovery peer support activities weekly      Start:  08/07/23         Work with Fannie Knee to identify the major components of a recent episode of depression: physical symptoms, major thoughts and images, and major behaviors they experienced     Start:  08/07/23         Therapist will educate patient on cognitive distortions and the rationale for treatment of depression     Start:  08/07/23         Mande will identify AT LEAST 3 cognitive distortions they are currently using and write reframing statements to replace them     Start:  08/07/23         Therapist will review PLEASE Skills (Treat Physical Illness, Balance Eating, Avoid Mood-Altering Substances, Balance Sleep and Get Exercise) with patient     Start:  08/07/23            Barara verbally agrees to treatment plan.

## 2023-08-07 NOTE — Telephone Encounter (Signed)
Lvm for pt to give [place] a call back, also mychart message was sent to pt.

## 2023-08-07 NOTE — Progress Notes (Signed)
Virtual Visit via Video Note  I connected with Angelica Robertson on 08/07/23 at  9:00 AM EDT by a video enabled telemedicine application and verified that I am speaking with the correct person using two identifiers.  Location: Patient: Office Provider: Home   I discussed the limitations of evaluation and management by telemedicine and the availability of in person appointments. The patient expressed understanding and agreed to proceed.   I discussed the assessment and treatment plan with the patient. The patient was provided an opportunity to ask questions and all were answered. The patient agreed with the plan and demonstrated an understanding of the instructions.   The patient was advised to call back or seek an in-person evaluation if the symptoms worsen or if the condition fails to improve as anticipated.  I provided 15 minutes of non-face-to-face time during this encounter.   Oneta Rack, NP    Psychiatric Initial Adult Assessment   Patient Identification: Angelica Robertson MRN:  [id number] Date of Evaluation:  08/07/2023 Referral Source: Eappen Chief Complaint:  Depression and anxiety  Visit Diagnosis:    ICD-10-CM   1. MDD (major depressive disorder), recurrent, in partial remission (HCC)  F33.41 OT eval and treat    Admit to Partial Hospitalization Program    2. Generalized anxiety disorder  F41.1 OT eval and treat    Admit to Partial Hospitalization Program      History of Present Illness:  Angelica Robertson is a 77 year old Caucasian female that was referred by her psychiatrist Eappen.  Shalayne reports a history of depression and anxiety states she has episodes of worsening depression.  States this episode was triggered by decline in health.  States she was at the gym when she injured her neck and has had multiple hospital visits over the past month. 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 States she was asked to come in after recent  mammogram results but everything was benign.    Reports she has been prescribed Lexapro and Remeron in the past.  Reports she had a depressive episode in her mid 65s.  States she has been on antidepressants.  States previous inpatient admissions roughly 5 times throughout her life.  Reports she was recently started on Abilify 2 mg.  States she is not feeling the effects of the medication at this time.  Patient encouraged to continue medications as indicated by primary psychiatrist.  She was receptive to plan.  Israh denied illicit drug use or substance abuse history.  She denied family history with mental illness.  Reports she has been married for 55 years.  States her husband is supportive.  States they have 2 children 93 and 35 years old.  Denies that she has ever attended intensive outpatient programming and/or partial hospitalization program.  Denied previous suicide attempts or self injures behaviors.  Patient to start partial hospitalization programming on 08/07/2023  During evaluation [name] is sitting; she is alert/oriented x 4; calm/cooperative; and mood congruent with affect.  Patient is speaking in a clear tone at moderate volume, and normal pace; with good eye contact. Her thought process is coherent and relevant; There is no indication that she is currently responding to internal/external stimuli or experiencing delusional thought content.  Patient denies suicidal/self-harm/homicidal ideation, psychosis, and paranoia.  Patient has remained calm throughout assessment and has answered questions appropriately.   Associated Signs/Symptoms: Depression Symptoms:  feelings of worthlessness/guilt, anxiety, (Hypo) Manic Symptoms:  Distractibility, Irritable Mood,  Anxiety Symptoms:  Excessive Worry, Psychotic Symptoms:  Hallucinations: None PTSD Symptoms: NA  Past Psychiatric History:   Previous Psychotropic Medications: No   Substance Abuse History in the last 12 months:   No.  Consequences of Substance Abuse: NA  Past Medical History:  Past Medical History:  Diagnosis Date   Depression 2000   Heart murmur    Hypercholesterolemia Osteoporosis   Hyperlipemia     Past Surgical History:  Procedure Laterality Date   ABDOMINAL HYSTERECTOMY  1980   COLONOSCOPY WITH PROPOFOL N/A 03/05/2018   Procedure: COLONOSCOPY WITH PROPOFOL;  Surgeon: Toledo, Boykin Nearing, MD;  Location: [facility]COPY;  Service: Endoscopy;  Laterality: N/A;   COLONOSCOPY WITH PROPOFOL N/A 04/23/2023   Procedure: COLONOSCOPY WITH PROPOFOL;  Surgeon: Regis Bill, MD;  Location: [facility] ENDOSCOPY;  Service: Endoscopy;  Laterality: N/A;    Family Psychiatric History:   Family History:  Family History  Problem Relation Age of Onset   Heart disease Mother    Diabetes Mother    Heart disease Father    Diabetes Father    Colon cancer Other    Congestive Heart Failure Brother    Colon cancer Brother    Mental illness Paternal Aunt    BRCA 1/2 Neg Hx    Breast cancer Neg Hx    Cowden syndrome Neg Hx    DES usage Neg Hx    Endometrial cancer Neg Hx    Li-Fraumeni syndrome Neg Hx    Ovarian cancer Neg Hx     Social History:   Social History   Socioeconomic History   Marital status: Married    Spouse name: robert   Number of children: 2   Years of education: Not on file   Highest education level: 12th grade  Occupational History   Not on file  Tobacco Use   Smoking status: Never   Smokeless tobacco: Never  Vaping Use   Vaping status: Never Used  Substance and Sexual Activity   Alcohol use: Yes    Alcohol/week: 0.0 standard drinks of alcohol    Comment: occasionally - socially during the summer a glass of wine or beer   Drug use: No   Sexual activity: Yes  Other Topics Concern   Not on file  Social History Narrative   Not on file   Social Determinants of Health   Financial Resource Strain: Low Risk  (04/29/2023)   Overall Financial Resource Strain (CARDIA)     Difficulty of Paying Living Expenses: Not hard at all  Food Insecurity: No Food Insecurity (04/29/2023)   Hunger Vital Sign    Worried About Running Out of Food in the Last Year: Never true    Ran Out of Food in the Last Year: Never true  Transportation Needs: No Transportation Needs (04/29/2023)   PRAPARE - Administrator, Civil Service (Medical): No    Lack of Transportation (Non-Medical): No  Physical Activity: Insufficiently Active (04/29/2023)   Exercise Vital Sign    Days of Exercise per Week: 3 days    Minutes of Exercise per Session: 30 min  Stress: No Stress Concern Present (04/29/2023)   [employer] of Occupational Health - Occupational Stress Questionnaire    Feeling of Stress : Not at all  Social Connections: Socially Integrated (04/29/2023)   Social Connection and Isolation Panel [NHANES]    Frequency of Communication with Friends and Family: Twice a week    Frequency of Social Gatherings with Friends and Family: Twice a  week    Attends Religious Services: More than 4 times per year    Active Member of Clubs or Organizations: Yes    Attends Banker Meetings: More than 4 times per year    Marital Status: Married    Additional Social History:   Allergies:  No Known Allergies  Metabolic Disorder Labs: Lab Results  Component Value Date   HGBA1C 6.3 04/25/2023   No results found for: "PROLACTIN" Lab Results  Component Value Date   CHOL 109 04/25/2023   TRIG 111.0 04/25/2023   HDL 44.80 04/25/2023   CHOLHDL 2 04/25/2023   VLDL 22.2 04/25/2023   LDLCALC 42 04/25/2023   LDLCALC 67 12/26/2022   Lab Results  Component Value Date   TSH 1.20 09/22/2022    Therapeutic Level Labs: No results found for: "LITHIUM" No results found for: "CBMZ" No results found for: "VALPROATE"  Current Medications: Current Outpatient Medications  Medication Sig Dispense Refill   ALPRAZolam (XANAX) 0.25 MG tablet Take 1 tablet (0.25 mg total) by mouth as  directed. Take daily as needed for severe anxiety attacks ,please limit use 15 tablet 0   amLODipine (NORVASC) 2.5 MG tablet Take 1 tablet (2.5 mg total) by mouth daily. In am 90 tablet 3   ARIPiprazole (ABILIFY) 2 MG tablet Take 1 tablet (2 mg total) by mouth in the morning. 30 tablet 1   calcium carbonate (OS-CAL) 600 MG TABS tablet Take 600 mg by mouth 2 (two) times daily with a meal.     diazepam (VALIUM) 5 MG tablet 1 po 30 minutes before ESI (Patient not taking: Reported on 07/31/2023)     escitalopram (LEXAPRO) 10 MG tablet Take 1 tablet (10 mg total) by mouth daily with breakfast. 90 tablet 1   losartan (COZAAR) 25 MG tablet TAKE 1 TABLET BY MOUTH ONCE DAILY 90 tablet 1   methocarbamol (ROBAXIN) 500 MG tablet 1/2-1 po q day prn     mirtazapine (REMERON) 15 MG tablet Take 1.5 tablets (22.5 mg total) by mouth at bedtime. 135 tablet 1   pantoprazole (PROTONIX) 20 MG tablet Take 1 tablet (20 mg total) by mouth daily. 90 tablet 1   rosuvastatin (CRESTOR) 20 MG tablet Take 1 tablet (20 mg total) by mouth at bedtime. 90 tablet 3   traMADol (ULTRAM) 50 MG tablet Take by mouth. (Patient not taking: Reported on 07/31/2023)     VITAMIN D PO Take by mouth daily.     No current facility-administered medications for this visit.    Musculoskeletal: Tele-assessment   Psychiatric Specialty Exam: Review of Systems  Psychiatric/Behavioral:  Positive for decreased concentration and sleep disturbance. The patient is nervous/anxious.   All other systems reviewed and are negative.   Last menstrual period 11/27/1979.There is no height or weight on file to calculate BMI.  General Appearance: Casual  Eye Contact:  Good  Speech:  Clear and Coherent  Volume:  Normal  Mood:  Anxious and Depressed  Affect:  Congruent  Thought Process:  Coherent  Orientation:  Full (Time, Place, and Person)  Thought Content:  Logical  Suicidal Thoughts:  No  Homicidal Thoughts:  No  Memory:  Immediate;   Good Recent;    Good  Judgement:  Good  Insight:  Good  Psychomotor Activity:  Normal  Concentration:  Concentration: Good  Recall:  Good  Fund of Knowledge:Good  Language: Good  Akathisia:  No  Handed:  Right  AIMS (if indicated):  not done  Assets:  Resilience  Social Support  ADL's:  Intact  Cognition: WNL  Sleep:  Good   Screenings: AIMS    Flowsheet Row Office Visit from 06/20/2022 in confidential department Office Visit from 04/24/2022 in confidential department  AIMS Total Score 0 0      AUDIT    Flowsheet Row Admission (Discharged) from 05/12/2022 in [facility] Admission (Discharged) from 12/17/2019 in [facility] INPATIENT BE[facility]AVIORAL MEDICINE Admission (Discharged) [facility]  Alcohol Use Disorder Identification Test Final Score (AUDIT) 0 0 0      GAD-7    Flowsheet Row Office Visit from 07/31/2023 in confidential department Office Visit from 11/15/2022 in confidential department Office Visit from 09/20/2022 in confidential department Office Visit from 08/22/2022 in confidential department Office Visit from 08/18/2022 in [facility] [employer] [facility]  Total GAD-7 Score 15 1 6 20 7       Mini-Mental    Flowsheet Row Clinical Support from 02/27/2018 in [facility] [employer] Clinical Support from 02/02/2017 in [facility] [employer] Clinical Support from 02/03/2016 in [facility] [employer]  Total Score (max 30 points ) 30 30 30       PHQ2-9    Flowsheet Row Counselor from 08/01/2023 in [facility]PITALIZATION PROGRAM Office Visit from 07/31/2023 in confidential department Office Visit from 05/25/2023 in [facility] [place] [facility] [employer] Clinical Support from 02/12/2023 in [facility] [employer] Video Visit from 02/01/2023 in confidential  department  PHQ-2 Total Score 6 6 1  0 0  PHQ-9 Total Score 25 25 2  -- --      Flowsheet Row Counselor from 08/01/2023 in [facility]AL [facility]ALTH PARTIAL HOSPITALIZATION PROGRAM Office Visit from 07/31/2023 in confidential department Video Visit from 04/24/2023 in confidential department  C-SSRS RISK CATEGORY Error: Q3, 4, or 5 should not be populated when Q2 is No Low Risk No Risk       Assessment and Plan:  Patient to start Partial Hospitalization Programming (PHP)   Orders placed for occupational therapy Patient to continue Remeron, Abilify and Lexapro  Collaboration of Care: Psychiatrist AEB Eappen  Patient/Guardian was advised Release of Information must be obtained prior to any record release in order to collaborate their care with an outside provider. Patient/Guardian was advised if they have not already done so to contact the registration department to sign all necessary forms in order for [place] to release information regarding their care.   Consent: Patient/Guardian gives verbal consent for treatment and assignment of benefits for services provided during this visit. Patient/Guardian expressed understanding and agreed to proceed.   Oneta Rack, NP 8/27/202410:59 AM

## 2023-08-07 NOTE — Telephone Encounter (Signed)
Please call and confirm she is doing ok.  Please schedule an appt with me for evaluation.

## 2023-08-08 ENCOUNTER — Other Ambulatory Visit (HOSPITAL_COMMUNITY): Payer: Medicare PPO | Attending: Psychiatry

## 2023-08-08 ENCOUNTER — Other Ambulatory Visit (HOSPITAL_COMMUNITY): Payer: Medicare PPO | Admitting: Licensed Clinical Social Worker

## 2023-08-08 DIAGNOSIS — F332 Major depressive disorder, recurrent severe without psychotic features: Secondary | ICD-10-CM

## 2023-08-08 DIAGNOSIS — F411 Generalized anxiety disorder: Secondary | ICD-10-CM | POA: Diagnosis not present

## 2023-08-08 DIAGNOSIS — R4589 Other symptoms and signs involving emotional state: Secondary | ICD-10-CM

## 2023-08-08 DIAGNOSIS — F3341 Major depressive disorder, recurrent, in partial remission: Secondary | ICD-10-CM | POA: Diagnosis not present

## 2023-08-08 NOTE — Telephone Encounter (Signed)
Patient called office back. Front office ask how patient is doing, she stated not very good.

## 2023-08-08 NOTE — Telephone Encounter (Signed)
FYI- spoke with patient. She is seeing Scientist, forensic tomorrow for bulging disc in her neck. She has contacted psych about depression and they are following. She is also doing virtual outpt meetings everyday from 9-1 for her depression. She Is wanting to talk to you about night sweats and dry mouth. Requested labs done. She is due for fasting labs. I have cancelled acute visit with NP tomorrow, scheduled her for fasting labs on Friday and a f/u with you on Tuesday. Pt agreeable with this plan and was ok to wait until next week to see you.

## 2023-08-08 NOTE — Telephone Encounter (Signed)
LMTCB

## 2023-08-09 ENCOUNTER — Other Ambulatory Visit (HOSPITAL_COMMUNITY): Payer: Medicare PPO

## 2023-08-09 ENCOUNTER — Encounter (HOSPITAL_COMMUNITY): Payer: Self-pay

## 2023-08-09 ENCOUNTER — Other Ambulatory Visit (HOSPITAL_BASED_OUTPATIENT_CLINIC_OR_DEPARTMENT_OTHER): Payer: Medicare PPO | Admitting: Licensed Clinical Social Worker

## 2023-08-09 DIAGNOSIS — F332 Major depressive disorder, recurrent severe without psychotic features: Secondary | ICD-10-CM

## 2023-08-09 DIAGNOSIS — R4589 Other symptoms and signs involving emotional state: Secondary | ICD-10-CM

## 2023-08-09 DIAGNOSIS — M5412 Radiculopathy, cervical region: Secondary | ICD-10-CM | POA: Diagnosis not present

## 2023-08-09 DIAGNOSIS — M5020 Other cervical disc displacement, unspecified cervical region: Secondary | ICD-10-CM | POA: Diagnosis not present

## 2023-08-09 DIAGNOSIS — M5030 Other cervical disc degeneration, unspecified cervical region: Secondary | ICD-10-CM | POA: Diagnosis not present

## 2023-08-09 NOTE — Therapy (Signed)
[facility] PARTIAL HOSPITALIZATION PROGRAM [address] [place], [place], [address] Phone: [phone]   Fax:  [phone]  Occupational Therapy Treatment Virtual Visit via Video Note  I connected with Angelica Robertson on 08/09/23 at  8:00 AM EDT by a video enabled telemedicine application and verified that I am speaking with the correct person using two identifiers.  Location: Patient: home Provider: office   I discussed the limitations of evaluation and management by telemedicine and the availability of in person appointments. The patient expressed understanding and agreed to proceed.    The patient was advised to call back or seek an in-person evaluation if the symptoms worsen or if the condition fails to improve as anticipated.  I provided 55 minutes of non-face-to-face time during this encounter.   Patient Details  Name: Angelica Robertson MRN: [id number] Date of Birth: June 16, 1946 No data recorded  Encounter Date: 08/08/2023   OT End of Session - 08/09/23 1100     Visit Number 2    Number of Visits 20    Date for OT Re-Evaluation 09/08/23    OT Start Time 1200    OT Stop Time 1255    OT Time Calculation (min) 55 min             Past Medical History:  Diagnosis Date   Depression 2000   Heart murmur    Hypercholesterolemia Osteoporosis   Hyperlipemia     Past Surgical History:  Procedure Laterality Date   ABDOMINAL HYSTERECTOMY  1980   COLONOSCOPY WITH PROPOFOL N/A 03/05/2018   Procedure: COLONOSCOPY WITH PROPOFOL;  Surgeon: Toledo, Boykin Nearing, MD;  Location: [facility] ENDOSCOPY;  Service: Endoscopy;  Laterality: N/A;   COLONOSCOPY WITH PROPOFOL N/A 04/23/2023   Procedure: COLONOSCOPY WITH PROPOFOL;  Surgeon: Regis Bill, MD;  Location: [facility] ENDOSCOPY;  Service: Endoscopy;  Laterality: N/A;    There were no vitals filed for this visit.   Subjective Assessment - 08/09/23 1059     Currently in Pain? No/denies    Pain Score 0-No pain                Group Session:  S: Looking forward to applying some of these goal skills to feel better.   O:  During today's OT group session, the patient participated in an educational segment about the importance of goal-setting and the application of the SMART framework to enhance daily life, particularly focusing on ADLs and iADLs. The session began with five open-ended pre-session questions that facilitated group discussion and introspection about their current relationship with goals. Following the introduction and educational segment, participants engaged in brainstorming and group discussions to devise hypothetical SMART goals. The session concluded with five post-session questions to reinforce understanding and facilitate reflection. Throughout the session, there was a range of engagement levels noted among the participants.   A:  Patient demonstrated a high level of engagement throughout the session. They actively participated in discussions, sharing personal experiences related to goal setting and challenges faced. Patient was able to clearly articulate an understanding of the SMART framework and proposed personal SMART goals related to their own ADLs with minimal assistance. They expressed enthusiasm about applying what they learned to their daily routine and appeared motivated to make changes.    P: Continue to attend PHP OT group sessions 5x week for 4 weeks to promote daily structure, social engagement, and opportunities to develop and utilize adaptive strategies to maximize functional performance in preparation for safe transition  and integration back into school, work, and the community. Plan to address topic of tbd in next OT group session.                    OT Education - 08/09/23 1100     Education Details SMART Goals 3              OT Short Term Goals - 08/09/23 0950       OT SHORT TERM GOAL #1   Title Client will develop and utilize a  personalized coping toolbox containing at least five coping strategies to manage challenging situations, demonstrating their use in real-life scenarios by the end of therapy.    Time 4    Period Weeks    Status On-going    Target Date 09/08/23      OT SHORT TERM GOAL #2   Title Patient will independently apply psychosocial skills and coping mechanisms to her daily activities in order to function independently.    Status On-going      OT SHORT TERM GOAL #3   Title Patient will be educated on strategies to improve psychosocial skills needed to participate fully in all daily, work, and leisure activities.    Status On-going                      Plan - 08/09/23 1100     Psychosocial Skills Coping Strategies;Habits;Interpersonal Interaction;Routines and Behaviors             Patient will benefit from skilled therapeutic intervention in order to improve the following deficits and impairments:       Psychosocial Skills: Coping Strategies, Habits, Interpersonal Interaction, Routines and Behaviors   Visit Diagnosis: Difficulty coping    Problem List Patient Active Problem List   Diagnosis Date Noted   Severe episode of recurrent major depressive disorder, without psychotic features (HCC) 07/31/2023   At risk for prolonged QT interval syndrome 07/31/2023   Back pain 05/25/2023   Hemorrhoid 05/07/2023   Laryngopharyngeal reflux 10/10/2022   Hair loss 09/22/2022   Nausea 09/22/2022   High risk medication use 08/22/2022   Acid reflux 08/20/2022   Head injury 06/08/2022   Headache 06/08/2022   Drug reaction 06/07/2022   Hypertension 12/01/2020   Generalized anxiety disorder 01/12/2020   B12 deficiency 12/23/2019   Leukocytosis 12/23/2019   Major depressive disorder, recurrent, severe without psychotic features (HCC) 12/18/2019   MDD (major depressive disorder), recurrent, in partial remission (HCC) 12/10/2019   Restless leg syndrome 06/02/2019   Colon polyps  01/01/2018   Syncope 12/30/2017   Type 2 diabetes mellitus with hyperglycemia (HCC) 08/26/2015   Cervicalgia 05/30/2015   Healthcare maintenance 02/28/2015   Osteopenia 08/17/2014   Elevated blood pressure reading 06/14/2014   Sinus tachycardia 11/15/2012   Hypercholesterolemia 10/10/2012    Ted Mcalpine, OT 08/09/2023, 11:00 AM  Kerrin Champagne, OT   [facility] HOSPITALIZATION PROGRAM [address] [place], [place], [address] Phone: [phone]   Fax:  [phone]  Name: Angelica Robertson MRN: [id number] Date of Birth: 1946/03/10

## 2023-08-09 NOTE — Therapy (Signed)
[facility] PARTIAL HOSPITALIZATION PROGRAM [address] [place], [place], [address] Phone: [phone]   Fax:  [phone]  Occupational Therapy Evaluation Virtual Visit via Video Note  I connected with Angelica Robertson on 08/09/23 at  8:00 AM EDT by a video enabled telemedicine application and verified that I am speaking with the correct person using two identifiers.  Location: Patient: home Provider: office   I discussed the limitations of evaluation and management by telemedicine and the availability of in person appointments. The patient expressed understanding and agreed to proceed.    The patient was advised to call back or seek an in-person evaluation if the symptoms worsen or if the condition fails to improve as anticipated.  I provided 85 minutes of non-face-to-face time during this encounter.   Patient Details  Name: Angelica Robertson MRN: [id number] Date of Birth: 1945/12/24 No data recorded  Encounter Date: 08/07/2023   OT End of Session - 08/09/23 0948     Visit Number 1    Number of Visits 20    Date for OT Re-Evaluation 09/08/23    OT Start Time 0930    OT Stop Time 1255   Ev: 30; Tx: 55   OT Time Calculation (min) 205 min    Activity Tolerance Patient tolerated treatment well    Behavior During Therapy WFL for tasks assessed/performed             Past Medical History:  Diagnosis Date   Depression 2000   Heart murmur    Hypercholesterolemia Osteoporosis   Hyperlipemia     Past Surgical History:  Procedure Laterality Date   ABDOMINAL HYSTERECTOMY  1980   COLONOSCOPY WITH PROPOFOL N/A 03/05/2018   Procedure: COLONOSCOPY WITH PROPOFOL;  Surgeon: Toledo, Boykin Nearing, MD;  Location: [facility] ENDOSCOPY;  Service: Endoscopy;  Laterality: N/A;   COLONOSCOPY WITH PROPOFOL N/A 04/23/2023   Procedure: COLONOSCOPY WITH PROPOFOL;  Surgeon: Regis Bill, MD;  Location: [facility] ENDOSCOPY;  Service: Endoscopy;  Laterality: N/A;    There were  no vitals filed for this visit.   Subjective Assessment - 08/09/23 0945     Subjective  Hoping to learn some new coping skills while I'm here.    Pertinent History MDD, GAD,    Patient Stated Goals improve coping skills for improved quality of life and leisure re-engagement    Currently in Pain? Yes    Pain Score 5     Pain Location Head    Pain Orientation Anterior    Pain Descriptors / Indicators Aching    Pain Type Neuropathic pain    Multiple Pain Sites No                 OT Assessment  Diagnosis:  MDD, GAD Past medical history/referral information: Hx of MDD / GAD Living situation: husband ADLs: independent  Work: retired  Leisure: inhibited Social support:  intact Struggles: pain / psychosocial / motivation / goal adherence / leisure / routines  OT goal:  improve deficit areas to allow for improved occupational performance   OCAIRS Mental Health Interview Summary of Client Scores:  Facilitates participation in occupation Allows participation in occupation Inhibits participation in occupation Restricts participation in occupation Comments:  Roles   X    Habits    X   Personal Causation   X    Values  X     Interests    X   Skills   X    Short-Term Goals  X    Long-term Goals   X    Interpretation of Past Experiences  X     Physical Environment X      Social Environment  X     Readiness for Change    X     Need for Occupational Therapy:  4 Shows positive occupational participation, no need for OT.   3 Need for minimal intervention/consultative participation   2 Need for OT intervention indicated to restore/improve participation   1 Need for extensive OT intervention indicated to improve participation.  Referral for follow up services also recommended.   Assessment:  Patient demonstrates behavior that INHIBITS participation in occupation.  Patient will benefit from occupational therapy intervention in order to improve time management, financial management,  stress management, job readiness skills, social skills, and health management skills in preparation to return to full time community living and to be a productive community member.    Plan:  Patient will participate in skilled occupational therapy sessions individually or in a group setting to improve coping skills, psychosocial skills, and emotional skills required to return to prior level of function. Treatment will be 4-5 times per week for 4 weeks.    Kerrin Champagne, OT  Group Session:   O: During today's OT group session, the patient participated in an educational segment about the importance of goal-setting and the application of the SMART framework to enhance daily life, particularly focusing on ADLs and iADLs. The session began with five open-ended pre-session questions that facilitated group discussion and introspection about their current relationship with goals. Following the introduction and educational segment, participants engaged in brainstorming and group discussions to devise hypothetical SMART goals. The session concluded with five post-session questions to reinforce understanding and facilitate reflection. Throughout the session, there was a range of engagement levels noted among the participants.   A:  Patient demonstrated a high level of engagement throughout the session. They actively participated in discussions, sharing personal experiences related to goal setting and challenges faced. Patient was able to clearly articulate an understanding of the SMART framework and proposed personal SMART goals related to their own ADLs with minimal assistance. They expressed enthusiasm about applying what they learned to their daily routine and appeared motivated to make changes.                    OT Education - 08/09/23 0947     Education Details OCAIRS / SMART Golas Tx    Person(s) Educated Patient    Methods Explanation;Handout    Comprehension Verbalized  understanding              OT Short Term Goals - 08/09/23 0950       OT SHORT TERM GOAL #1   Title Client will develop and utilize a personalized coping toolbox containing at least five coping strategies to manage challenging situations, demonstrating their use in real-life scenarios by the end of therapy.    Time 4    Period Weeks    Status On-going    Target Date 09/08/23      OT SHORT TERM GOAL #2   Title Patient will independently apply psychosocial skills and coping mechanisms to her daily activities in order to function independently.    Status On-going      OT SHORT TERM GOAL #3   Title Patient will be educated on strategies to improve psychosocial skills needed to participate fully in all daily, work, and leisure activities.    Status On-going  Plan - 08/09/23 0948     Clinical Impression Statement Pt presents w/ deficits across multiple psychosocial domains that directly or indirectly INHIBIT occupational performance and quality of life.    OT Occupational Profile and History Problem Focused Assessment - Including review of records relating to presenting problem    Occupational performance deficits (Please refer to evaluation for details): Rest and Sleep;Leisure;Social Participation    Psychosocial Skills Coping Strategies;Habits;Interpersonal Interaction;Routines and Behaviors    Rehab Potential Good    Clinical Decision Making Limited treatment options, no task modification necessary    Comorbidities Affecting Occupational Performance: None    Modification or Assistance to Complete Evaluation  No modification of tasks or assist necessary to complete eval    OT Frequency 5x / week    OT Duration 4 weeks    OT Treatment/Interventions Psychosocial skills training;Coping strategies training    Consulted and Agree with Plan of Care Patient             Patient will benefit from skilled therapeutic intervention in order to improve  the following deficits and impairments:       Psychosocial Skills: Coping Strategies, Habits, Interpersonal Interaction, Routines and Behaviors   Visit Diagnosis: Difficulty coping  MDD (major depressive disorder), recurrent, in partial remission (HCC)  Generalized anxiety disorder  GAD (generalized anxiety disorder)    Problem List Patient Active Problem List   Diagnosis Date Noted   Severe episode of recurrent major depressive disorder, without psychotic features (HCC) 07/31/2023   At risk for prolonged QT interval syndrome 07/31/2023   Back pain 05/25/2023   Hemorrhoid 05/07/2023   Laryngopharyngeal reflux 10/10/2022   Hair loss 09/22/2022   Nausea 09/22/2022   High risk medication use 08/22/2022   Acid reflux 08/20/2022   Head injury 06/08/2022   Headache 06/08/2022   Drug reaction 06/07/2022   Hypertension 12/01/2020   Generalized anxiety disorder 01/12/2020   B12 deficiency 12/23/2019   Leukocytosis 12/23/2019   Major depressive disorder, recurrent, severe without psychotic features (HCC) 12/18/2019   MDD (major depressive disorder), recurrent, in partial remission (HCC) 12/10/2019   Restless leg syndrome 06/02/2019   Colon polyps 01/01/2018   Syncope 12/30/2017   Type 2 diabetes mellitus with hyperglycemia (HCC) 08/26/2015   Cervicalgia 05/30/2015   Healthcare maintenance 02/28/2015   Osteopenia 08/17/2014   Elevated blood pressure reading 06/14/2014   Sinus tachycardia 11/15/2012   Hypercholesterolemia 10/10/2012    Ted Mcalpine, OT 08/09/2023, 9:52 AM  Kerrin Champagne, OT   [facility] HOSPITALIZATION PROGRAM [address] [place], [place], [address] Phone: [phone]   Fax:  [phone]  Name: Angelica Robertson MRN: [id number] Date of Birth: 10/14/1946

## 2023-08-10 ENCOUNTER — Encounter (HOSPITAL_COMMUNITY): Payer: Self-pay

## 2023-08-10 ENCOUNTER — Other Ambulatory Visit (HOSPITAL_COMMUNITY): Payer: Medicare PPO

## 2023-08-10 ENCOUNTER — Other Ambulatory Visit (INDEPENDENT_AMBULATORY_CARE_PROVIDER_SITE_OTHER): Payer: Medicare PPO

## 2023-08-10 ENCOUNTER — Ambulatory Visit: Payer: Medicare PPO | Admitting: Nurse Practitioner

## 2023-08-10 ENCOUNTER — Other Ambulatory Visit (HOSPITAL_COMMUNITY): Payer: Medicare PPO | Admitting: Licensed Clinical Social Worker

## 2023-08-10 DIAGNOSIS — F332 Major depressive disorder, recurrent severe without psychotic features: Secondary | ICD-10-CM

## 2023-08-10 DIAGNOSIS — F411 Generalized anxiety disorder: Secondary | ICD-10-CM | POA: Diagnosis not present

## 2023-08-10 DIAGNOSIS — F3341 Major depressive disorder, recurrent, in partial remission: Secondary | ICD-10-CM | POA: Diagnosis not present

## 2023-08-10 DIAGNOSIS — I10 Essential (primary) hypertension: Secondary | ICD-10-CM | POA: Diagnosis not present

## 2023-08-10 DIAGNOSIS — R739 Hyperglycemia, unspecified: Secondary | ICD-10-CM | POA: Diagnosis not present

## 2023-08-10 DIAGNOSIS — R4589 Other symptoms and signs involving emotional state: Secondary | ICD-10-CM

## 2023-08-10 DIAGNOSIS — E7800 Pure hypercholesterolemia, unspecified: Secondary | ICD-10-CM | POA: Diagnosis not present

## 2023-08-10 NOTE — Psych (Signed)
Virtual Visit via Video Note  I connected with Angelica Robertson on 08/10/23 at  9:00 AM EDT by a video enabled telemedicine application and verified that I am speaking with the correct person using two identifiers.  Location: Patient: pt's home in [place], [place] Provider: clinical home office in [place], [place]   I discussed the limitations of evaluation and management by telemedicine and the availability of in person appointments. The patient expressed understanding and agreed to proceed.   I discussed the assessment and treatment plan with the patient. The patient was provided an opportunity to ask questions and all were answered. The patient agreed with the plan and demonstrated an understanding of the instructions.   The patient was advised to call back or seek an in-person evaluation if the symptoms worsen or if the condition fails to improve as anticipated.  I provided 240 minutes of non-face-to-face time during this encounter.   Wyvonnia Lora, LCSW   [facility] BH PHP THERAPIST PROGRESS NOTE  Angelica Robertson [id number]   Session Time: 9:00 am - 10:00 am  Participation Level: Active  Behavioral Response: CasualAlertDepressed  Type of Therapy: Group Therapy  Treatment Goals addressed: Coping  Progress Towards Goals: Progressing  Interventions: CBT, DBT, Solution Focused, Strength-based, Supportive, and Reframing  Therapist Response: Clinician led check-in regarding current stressors and situation, and review of patient completed daily inventory. Clinician utilized active listening and empathetic response and validated patient emotions. Clinician facilitated processing group on pertinent issues.?   Summary: Patient arrived within time allowed. Patient rates her mood at a 5 on a scale of 1-10 with 10 being best. "I got out of bed, so I'm doing pretty good." She reports she slept from 10:30 until 5 am. She reports her appetite yesterday was fair. She denies SI/SH thoughts and denies a  topic for processing. Pt able to process.?Pt engaged in discussion.?      Session Time: 10:00 am - 11:00 am  Participation Level: Active  Behavioral Response: CasualAlertDepressed  Type of Therapy: Group Therapy  Treatment Goals addressed: Coping  Progress Towards Goals: Progressing  Interventions: CBT, DBT, Solution Focused, Strength-based, Supportive, and Reframing  Therapist Response: Clinician led group on emotional validation and shame regarding depression and anxiety. Clinician utilized CBT principles to inform discussion.   Summary: Pt engaged in discussion. She states that while her husband is supportive, the longer she struggles with depression, the harder it is for him, as he just wants her to be well. She shares feelings of guilt due to having multiple depressive episodes.    Session Time: 11:00 am - 12:00 pm  Participation Level: Active  Behavioral Response: CasualAlertDepressed  Type of Therapy: Group Therapy  Treatment Goals addressed: Coping  Progress Towards Goals: Progressing  Interventions: CBT, DBT, Solution Focused, Strength-based, Supportive, and Reframing  Therapist Response: Clinician led group on the topic of shame and how it impacts mental health. Clinician showed the Ted Talk by Dewain Penning entitled "The Power of Vulnerability" and utilized CBT principles to inform discussion.  Summary: Pt engaged in discussion and demonstrated good insight into the subject matter.   Session Time: 12:00 pm - 1:00 pm  Participation Level: Active  Behavioral Response: CasualAlertDepressed  Type of Therapy: Group Therapy  Treatment Goals addressed: Coping  Progress Towards Goals: Progressing  Interventions: CBT, DBT, Solution Focused, Strength-based, Supportive, and Reframing  Therapist Response: 12:00 - 12:50 pm: See OT note. 12:50 - 1:00 pm: Clinician led check-out. Clinician assessed for immediate needs, medication compliance and efficacy,  and safety  concerns?  Summary: 12:00 - 12:50 pm: See OT note 12:50 - 1:00 pm: At check-out, patient contracts for safety.?Patient demonstrates progress as evidenced by her continued engagement and by being receptive to treatment. Patient denies SI/HI/self-harm thoughts at the end of group and agrees to seek help should those thoughts/feelings occur.?   Suicidal/Homicidal: Nowithout intent/plan  Plan: ?Pt will continue in PHP and medication management while continuing to work on decreasing depression symptoms,?SI, and anxiety symptoms,?and increasing the ability to self manage symptoms.  Collaboration of Care: Medication Management AEB Hillery Jacks, NP and Princess Bruins, DO  Patient/Guardian was advised Release of Information must be obtained prior to any record release in order to collaborate their care with an outside provider. Patient/Guardian was advised if they have not already done so to contact the registration department to sign all necessary forms in order for [place] to release information regarding their care.   Consent: Patient/Guardian gives verbal consent for treatment and assignment of benefits for services provided during this visit. Patient/Guardian expressed understanding and agreed to proceed.   Diagnosis: Major depressive disorder, recurrent, severe without psychotic features (HCC) [F33.2]    1. Major depressive disorder, recurrent, severe without psychotic features [facility])       Wyvonnia Lora, LCSW 08/10/2023

## 2023-08-10 NOTE — Addendum Note (Signed)
Addended by: Jarvis Morgan D on: 08/10/2023 01:31 PM   Modules accepted: Orders

## 2023-08-10 NOTE — Therapy (Signed)
[facility] PARTIAL HOSPITALIZATION PROGRAM [address] [place], [place], [address] Phone: [phone]   Fax:  [phone]  Patient Details  Name: Angelica Robertson MRN: [id number] Date of Birth: Apr 16, 1946 Referring Provider:  Dale Montebello, MD  Encounter Date: 08/09/2023   Ted Mcalpine, OT 08/10/2023, 10:12 PM  Kerrin Champagne, OT   [facility] PROGRAM [address] [place], [place], [address] Phone: [phone]   Fax:  [phone]

## 2023-08-10 NOTE — Progress Notes (Signed)
Met with patient, through virtual session, as she presented with flat affect, depressed mood and stated she had a headache today and has been getting these more often recently.  Patient denied any current suicidal or homicidal ideations, plans, intent, or means to want to harm self or others.  Patient denied any auditory or visual hallucinations and stated no history of migraines but admitted gets headaches from time to time.  Patient reported her pain level of a 5 on a scale of 0-10 with 10 being the worst and stated she typically takes Extra Strength Tylenol, total of a 1000 mg when headaches occur but has not taken any yet today.  Patient reported coming into PHP after a recent increase in depression and that Dr. Elna Breslow was already making changes with her medications, adding Ability 2 mg daily to her current medication regimen.  Patient admitted to loss of more than 10 pounds over the past 3 months due to depression with loss of appetite along with less sleep, down to 5-6 hours per night.  Patient reported no present problems with current medications and is hopeful depression will improve soon with changes.  Patient reports living with her husband who is a positive support.  Patient not sure PHP will be helpful but willing to continue to give it a try.   Patient with no other concerns or issues today and reported plan to see primary care provider on Tuesday of the coming week and will discuss with them increased episodes of headaches if still issues then.  Patient agreed to contact this nurse or other members of the PHP treatment team if any worsening of depression and symptoms and is aware of how to seek out emergency care if any thoughts of wanting to harm self or others begins.

## 2023-08-11 LAB — CBC WITH DIFFERENTIAL/PLATELET
Basophils Absolute: 0.1 10*3/uL (ref 0.0–0.2)
Basos: 1 %
EOS (ABSOLUTE): 0.1 10*3/uL (ref 0.0–0.4)
Eos: 1 %
Hematocrit: 37.8 % (ref 34.0–46.6)
Hemoglobin: 12.6 g/dL (ref 11.1–15.9)
Immature Grans (Abs): 0 10*3/uL (ref 0.0–0.1)
Immature Granulocytes: 0 %
Lymphocytes Absolute: 2.4 10*3/uL (ref 0.7–3.1)
Lymphs: 27 %
MCH: 30.3 pg (ref 26.6–33.0)
MCHC: 33.3 g/dL (ref 31.5–35.7)
MCV: 91 fL (ref 79–97)
Monocytes Absolute: 0.7 10*3/uL (ref 0.1–0.9)
Monocytes: 8 %
Neutrophils Absolute: 5.7 10*3/uL (ref 1.4–7.0)
Neutrophils: 63 %
Platelets: 387 10*3/uL (ref 150–450)
RBC: 4.16 x10E6/uL (ref 3.77–5.28)
RDW: 12.7 % (ref 11.7–15.4)
WBC: 9 10*3/uL (ref 3.4–10.8)

## 2023-08-11 LAB — HEMOGLOBIN A1C
Est. average glucose Bld gHb Est-mCnc: 126 mg/dL
Hgb A1c MFr Bld: 6 % — ABNORMAL HIGH (ref 4.8–5.6)

## 2023-08-11 LAB — LIPID PANEL
Chol/HDL Ratio: 1.8 ratio (ref 0.0–4.4)
Cholesterol, Total: 118 mg/dL (ref 100–199)
HDL: 67 mg/dL (ref >39)
LDL Chol Calc (NIH): 38 mg/dL (ref 0–99)
Triglycerides: 60 mg/dL (ref 0–149)
VLDL Cholesterol Cal: 13 mg/dL (ref 5–40)

## 2023-08-11 LAB — BASIC METABOLIC PANEL
BUN/Creatinine Ratio: 19 (ref 12–28)
BUN: 12 mg/dL (ref 8–27)
CO2: 25 mmol/L (ref 20–29)
Calcium: 10.1 mg/dL (ref 8.7–10.3)
Chloride: 92 mmol/L — ABNORMAL LOW (ref 96–106)
Creatinine, Ser: 0.63 mg/dL (ref 0.57–1.00)
Glucose: 90 mg/dL (ref 70–99)
Potassium: 3.9 mmol/L (ref 3.5–5.2)
Sodium: 132 mmol/L — ABNORMAL LOW (ref 134–144)
eGFR: 92 mL/min/{1.73_m2} (ref >59)

## 2023-08-11 LAB — HEPATIC FUNCTION PANEL
ALT: 24 IU/L (ref 0–32)
AST: 29 IU/L (ref 0–40)
Albumin: 4.7 g/dL (ref 3.8–4.8)
Alkaline Phosphatase: 54 IU/L (ref 44–121)
Bilirubin Total: 0.5 mg/dL (ref 0.0–1.2)
Bilirubin, Direct: 0.18 mg/dL (ref 0.00–0.40)
Total Protein: 6.4 g/dL (ref 6.0–8.5)

## 2023-08-11 LAB — TSH: TSH: 1.86 u[IU]/mL (ref 0.450–4.500)

## 2023-08-11 NOTE — Psych (Signed)
Virtual Visit via Video Note  I connected with Angelica Robertson on 08/08/23 at  9:00 AM EDT by a video enabled telemedicine application and verified that I am speaking with the correct person using two identifiers.  Location: Patient: patient home Provider: clinical home office   I discussed the limitations of evaluation and management by telemedicine and the availability of in person appointments. The patient expressed understanding and agreed to proceed.  I discussed the assessment and treatment plan with the patient. The patient was provided an opportunity to ask questions and all were answered. The patient agreed with the plan and demonstrated an understanding of the instructions.   The patient was advised to call back or seek an in-person evaluation if the symptoms worsen or if the condition fails to improve as anticipated.  Pt was provided 240 minutes of non-face-to-face time during this encounter.   Donia Guiles, LCSW   [facility] BH PHP THERAPIST PROGRESS NOTE  Angelica Robertson [id number]  Session Time: 9:00 - 10:00  Participation Level: Active  Behavioral Response: CasualAlertAnxious and Depressed  Type of Therapy: Group Therapy  Treatment Goals addressed: Coping  Progress Towards Goals: Initial  Interventions: CBT, DBT, Supportive, and Reframing  Summary: Angelica Robertson is a 77 y.o. female who presents with depression and anxiety symptoms.  Clinician led check-in regarding current stressors and situation, and review of patient completed daily inventory. Clinician utilized active listening and empathetic response and validated patient emotions. Clinician facilitated processing group on pertinent issues.?    Therapist Response:  Patient arrived within time allowed. Patient rates her mood at a 4.5 on a scale of 1-10 with 10 being best. Pt states she feels "fair but not great." Pt states she slept 7 hours and ate 3x. Pt reports feeling overwhelmed by her depression and feeling  panicked that she is not getting better fast enough.   Pt states she did a few chores because "hey had to be done." Pt struggles to giver herself grace. Patient able to process. Patient engaged in discussion.            Session Time: 10:00 am - 11:00 am   Participation Level: Active   Behavioral Response: CasualAlertDepressed   Type of Therapy: Group Therapy   Treatment Goals addressed: Coping   Progress Towards Goals: Progressing   Interventions: CBT, DBT, Solution Focused, Strength-based, Supportive, and Reframing   Therapist Response: Cln led processing group for pt's current struggles. Group members shared stressors and provided support and feedback. Cln brought in topics of boundaries, healthy relationships, and unhealthy thought processes to inform discussion.    Therapist Response:  Pt able to process and provide support to group.           Session Time: 11:00 -12:00   Participation Level: Active   Behavioral Response: CasualAlertDepressed   Type of Therapy: Group Therapy   Treatment Goals addressed: Coping   Progress Towards Goals: Progressing   Interventions: Strength-based, Supportive, and Reframing   Summary: Chaplaincy group with K. Claussen   Therapist Response: Pt participated and engaged in discussion.          Session Time: 12:00 -1:00   Participation Level: Active   Behavioral Response: CasualAlertDepressed   Type of Therapy: Group therapy   Treatment Goals addressed: Coping   Progress Towards Goals: Progressing   Interventions: OT group   Summary: 12:00 - 12:50: Occupational Therapy group with cln E. Hollan.  12:50 - 1:00 Clinician assessed for immediate needs, medication compliance and efficacy,  and safety concerns.   Therapist Response: 12:00 - 12:50: See note 12:50 - 1:00 pm: At check-out, patient reports no immediate concerns. Patient demonstrates progress as evidenced by continued engagement and responsiveness to treatment.  Patient denies SI/HI/self-harm thoughts at the end of group.  Suicidal/Homicidal: Nowithout intent/plan  Plan: Pt will continue in PHP while working to decrease depression and anxiety symptoms, increase daily functioning, and increase ability to manage symptoms in a healthy manner.   Collaboration of Care: Medication Management AEB J Cyndie Chime  Patient/Guardian was advised Release of Information must be obtained prior to any record release in order to collaborate their care with an outside provider. Patient/Guardian was advised if they have not already done so to contact the registration department to sign all necessary forms in order for [place] to release information regarding their care.   Consent: Patient/Guardian gives verbal consent for treatment and assignment of benefits for services provided during this visit. Patient/Guardian expressed understanding and agreed to proceed.   Diagnosis: Severe episode of recurrent major depressive disorder, without psychotic features (HCC) [F33.2]    1. Severe episode of recurrent major depressive disorder, without psychotic features (HCC)   2. GAD (generalized anxiety disorder)      Donia Guiles, LCSW

## 2023-08-11 NOTE — Psych (Signed)
Virtual Visit via Video Note  I connected with JASMEEN GEISS on 08/07/23 at  9:00 AM EDT by a video enabled telemedicine application and verified that I am speaking with the correct person using two identifiers.  Location: Patient: patient home Provider: clinical home office   I discussed the limitations of evaluation and management by telemedicine and the availability of in person appointments. The patient expressed understanding and agreed to proceed.  I discussed the assessment and treatment plan with the patient. The patient was provided an opportunity to ask questions and all were answered. The patient agreed with the plan and demonstrated an understanding of the instructions.   The patient was advised to call back or seek an in-person evaluation if the symptoms worsen or if the condition fails to improve as anticipated.  Pt was provided 240 minutes of non-face-to-face time during this encounter.   Donia Guiles, LCSW   [facility] BH PHP THERAPIST PROGRESS NOTE  VENISSA JASA [phone]  Session Time: 9:00 - 10:00  Participation Level: Active  Behavioral Response: CasualAlertAnxious and Depressed  Type of Therapy: Group Therapy  Treatment Goals addressed: Coping  Progress Towards Goals: Initial  Interventions: CBT, DBT, Supportive, and Reframing  Summary: MALAIKAH CHROBAK is a 77 y.o. female who presents with depression and anxiety symptoms.  Clinician led check-in regarding current stressors and situation, and review of patient completed daily inventory. Clinician utilized active listening and empathetic response and validated patient emotions. Clinician facilitated processing group on pertinent issues.?    Therapist Response:  Patient arrived within time allowed. Patient rates her mood at a 4 on a scale of 1-10 with 10 being best. Pt states she feels "anxious." Pt states she slept 5.5 hours and ate 3x. Pt reports she does not have an appetite but her husband makes sure she eats.  Pt states having an EKG yesterday and it was an anxious process for her and she was stressed. Pt reports feeling worried about her depression as this is the worst episode she has had and she does not understand why. Pt reports struggling with a headache.  Patient able to process. Patient engaged in discussion.            Session Time: 10:00 am - 11:00 am   Participation Level: Active   Behavioral Response: CasualAlertDepressed   Type of Therapy: Group Therapy   Treatment Goals addressed: Coping   Progress Towards Goals: Progressing   Interventions: CBT, DBT, Solution Focused, Strength-based, Supportive, and Reframing   Therapist Response: Cln led discussion on how to fill unplanned time. Group members shared ways in which downtime negatively impacts mental health and often causes them to dwell on negative thinking. Group brainstormed ways to manage down time and problem solved how to handle barriers.    Therapist Response: Pt engaged in discussion.          Session Time: 11:00 -12:00   Participation Level: Active   Behavioral Response: CasualAlertDepressed   Type of Therapy: Group Therapy   Treatment Goals addressed: Coping   Progress Towards Goals: Progressing   Interventions: CBT, DBT, Solution Focused, Strength-based, Supportive, and Reframing   Summary: Cln introduced topic of boundaries. Cln discussed how boundaries inform our relationships and affect self-esteem and personal agency. Group discussed the three types of boundaries: rigid, porous, and healthy and when each type is most helpful/harmful.    Therapist Response:  Pt engaged in discussion and is able to identify each type in their life.  Session Time: 12:00 -1:00   Participation Level: Active   Behavioral Response: CasualAlertDepressed   Type of Therapy: Group therapy   Treatment Goals addressed: Coping   Progress Towards Goals: Progressing   Interventions: OT group   Summary: 12:00 -  12:50: Occupational Therapy group with cln E. Hollan.  12:50 - 1:00 Clinician assessed for immediate needs, medication compliance and efficacy, and safety concerns.   Therapist Response: 12:00 - 12:50: See note 12:50 - 1:00 pm: At check-out, patient reports no immediate concerns. Patient demonstrates progress as evidenced by participating in first group session. Patient denies SI/HI/self-harm thoughts at the end of group.  Suicidal/Homicidal: Nowithout intent/plan  Plan: Pt will continue in PHP while working to decrease depression and anxiety symptoms, increase daily functioning, and increase ability to manage symptoms in a healthy manner.   Collaboration of Care: Medication Management AEB J Cyndie Chime  Patient/Guardian was advised Release of Information must be obtained prior to any record release in order to collaborate their care with an outside provider. Patient/Guardian was advised if they have not already done so to contact the registration department to sign all necessary forms in order for [place] to release information regarding their care.   Consent: Patient/Guardian gives verbal consent for treatment and assignment of benefits for services provided during this visit. Patient/Guardian expressed understanding and agreed to proceed.   Diagnosis: Severe episode of recurrent major depressive disorder, without psychotic features (HCC) [F33.2]    1. Severe episode of recurrent major depressive disorder, without psychotic features (HCC)   2. MDD (major depressive disorder), recurrent, in partial remission (HCC)   3. Generalized anxiety disorder      Donia Guiles, LCSW

## 2023-08-13 ENCOUNTER — Other Ambulatory Visit (HOSPITAL_COMMUNITY): Payer: Medicare PPO

## 2023-08-14 ENCOUNTER — Other Ambulatory Visit (HOSPITAL_COMMUNITY): Payer: Medicare PPO | Attending: Psychiatry

## 2023-08-14 ENCOUNTER — Telehealth (HOSPITAL_COMMUNITY): Payer: Self-pay | Admitting: Licensed Clinical Social Worker

## 2023-08-14 ENCOUNTER — Other Ambulatory Visit (HOSPITAL_COMMUNITY): Payer: Medicare PPO | Attending: Psychiatry | Admitting: Licensed Clinical Social Worker

## 2023-08-14 ENCOUNTER — Ambulatory Visit: Payer: Medicare PPO | Admitting: Internal Medicine

## 2023-08-14 ENCOUNTER — Encounter: Payer: Self-pay | Admitting: Internal Medicine

## 2023-08-14 VITALS — BP 138/78 | HR 88 | Temp 98.0°F | Resp 16 | Ht 63.0 in | Wt 124.2 lb

## 2023-08-14 DIAGNOSIS — F419 Anxiety disorder, unspecified: Secondary | ICD-10-CM | POA: Diagnosis present

## 2023-08-14 DIAGNOSIS — F332 Major depressive disorder, recurrent severe without psychotic features: Secondary | ICD-10-CM | POA: Diagnosis not present

## 2023-08-14 DIAGNOSIS — E1165 Type 2 diabetes mellitus with hyperglycemia: Secondary | ICD-10-CM | POA: Diagnosis not present

## 2023-08-14 DIAGNOSIS — M1990 Unspecified osteoarthritis, unspecified site: Secondary | ICD-10-CM | POA: Diagnosis not present

## 2023-08-14 DIAGNOSIS — E7800 Pure hypercholesterolemia, unspecified: Secondary | ICD-10-CM

## 2023-08-14 DIAGNOSIS — F411 Generalized anxiety disorder: Secondary | ICD-10-CM | POA: Diagnosis not present

## 2023-08-14 DIAGNOSIS — E871 Hypo-osmolality and hyponatremia: Secondary | ICD-10-CM

## 2023-08-14 DIAGNOSIS — R519 Headache, unspecified: Secondary | ICD-10-CM

## 2023-08-14 DIAGNOSIS — F3342 Major depressive disorder, recurrent, in full remission: Secondary | ICD-10-CM | POA: Diagnosis not present

## 2023-08-14 DIAGNOSIS — Z79899 Other long term (current) drug therapy: Secondary | ICD-10-CM | POA: Insufficient documentation

## 2023-08-14 DIAGNOSIS — E785 Hyperlipidemia, unspecified: Secondary | ICD-10-CM | POA: Diagnosis not present

## 2023-08-14 DIAGNOSIS — I10 Essential (primary) hypertension: Secondary | ICD-10-CM

## 2023-08-14 DIAGNOSIS — F32A Depression, unspecified: Secondary | ICD-10-CM | POA: Diagnosis present

## 2023-08-14 DIAGNOSIS — R4589 Other symptoms and signs involving emotional state: Secondary | ICD-10-CM

## 2023-08-14 NOTE — Progress Notes (Signed)
Subjective:    Patient ID: Angelica Robertson, female    DOB: Nov 03, 1946, 77 y.o.   MRN: [id number]  Patient here for  Chief Complaint  Patient presents with   Dry Mouth   Night Sweats         HPI Here as a work in appt.  She is accompanied by her husband.  History obtained from both of them.  Work in with increased neck pain and increased anxiety/stress and depression. She is followed by psychiatry - Dr Elna Breslow.  Also participating in PHP.  Just started last week.  Medication being adjusted. Just started on abilify.  Husband reports increased depression after neck flare.  Increased neck pain.  Seeing Dr Mickeal Needy.  S/p injection.  Did help some.  Still with pain.  Eases when lying down. Headaches.  No chest pain.  Breathing stable.  No abdominal pain.  Discussed her depression.  Worsened recently.  Sleep is an issue.  Taking remeron.  No active SI currently.     Past Medical History:  Diagnosis Date   Depression 2000   Heart murmur    Hypercholesterolemia Osteoporosis   Hyperlipemia    Past Surgical History:  Procedure Laterality Date   ABDOMINAL HYSTERECTOMY  1980   COLONOSCOPY WITH PROPOFOL N/A 03/05/2018   Procedure: COLONOSCOPY WITH PROPOFOL;  Surgeon: Toledo, Boykin Nearing, MD;  Location: [facility] ENDOSCOPY;  Service: Endoscopy;  Laterality: N/A;   COLONOSCOPY WITH PROPOFOL N/A 04/23/2023   Procedure: COLONOSCOPY WITH PROPOFOL;  Surgeon: Regis Bill, MD;  Location: [facility] ENDOSCOPY;  Service: Endoscopy;  Laterality: N/A;   Family History  Problem Relation Age of Onset   Heart disease Mother    Diabetes Mother    Heart disease Father    Diabetes Father    Colon cancer Other    Congestive Heart Failure Brother    Colon cancer Brother    Mental illness Paternal Aunt    BRCA 1/2 Neg Hx    Breast cancer Neg Hx    Cowden syndrome Neg Hx    DES usage Neg Hx    Endometrial cancer Neg Hx    Li-Fraumeni syndrome Neg Hx    Ovarian cancer Neg Hx    Social History    Socioeconomic History   Marital status: Married    Spouse name: robert   Number of children: 2   Years of education: Not on file   Highest education level: 12th grade  Occupational History   Not on file  Tobacco Use   Smoking status: Never   Smokeless tobacco: Never  Vaping Use   Vaping status: Never Used  Substance and Sexual Activity   Alcohol use: Yes    Alcohol/week: 0.0 standard drinks of alcohol    Comment: occasionally - socially during the summer a glass of wine or beer   Drug use: No   Sexual activity: Yes  Other Topics Concern   Not on file  Social History Narrative   Not on file   Social Determinants of Health   Financial Resource Strain: Low Risk  (04/29/2023)   Overall Financial Resource Strain (CARDIA)    Difficulty of Paying Living Expenses: Not hard at all  Food Insecurity: No Food Insecurity (04/29/2023)   Hunger Vital Sign    Worried About Running Out of Food in the Last Year: Never true    Ran Out of Food in the Last Year: Never true  Transportation Needs: No Transportation Needs (04/29/2023)   PRAPARE - Transportation  Lack of Transportation (Medical): No    Lack of Transportation (Non-Medical): No  Physical Activity: Insufficiently Active (04/29/2023)   Exercise Vital Sign    Days of Exercise per Week: 3 days    Minutes of Exercise per Session: 30 min  Stress: No Stress Concern Present (04/29/2023)   [employer] of Occupational Health - Occupational Stress Questionnaire    Feeling of Stress : Not at all  Social Connections: Socially Integrated (04/29/2023)   Social Connection and Isolation Panel [NHANES]    Frequency of Communication with Friends and Family: Twice a week    Frequency of Social Gatherings with Friends and Family: Twice a week    Attends Religious Services: More than 4 times per year    Active Member of [employer] or Organizations: Yes    Attends Engineer, structural: More than 4 times per year    Marital Status: Married      Review of Systems  Constitutional:  Negative for appetite change and unexpected weight change.  HENT:  Negative for congestion and sinus pressure.   Respiratory:  Negative for cough, chest tightness and shortness of breath.   Cardiovascular:  Negative for chest pain, palpitations and leg swelling.  Gastrointestinal:  Negative for abdominal pain, diarrhea, nausea and vomiting.  Genitourinary:  Negative for difficulty urinating and dysuria.  Musculoskeletal:  Positive for neck pain. Negative for joint swelling and myalgias.  Skin:  Negative for color change and rash.  Neurological:  Positive for headaches. Negative for dizziness.  Psychiatric/Behavioral:  Positive for dysphoric mood.        Increased stress and anxiety.        Objective:     BP 138/78   Pulse 88   Temp 98 F (36.7 C)   Resp 16   Ht 5\' 3"  (1.6 m)   Wt 124 lb 3.2 oz (56.3 kg)   LMP 11/27/1979   SpO2 98%   BMI 22.00 kg/m  Wt Readings from Last 3 Encounters:  08/14/23 124 lb 3.2 oz (56.3 kg)  07/31/23 126 lb 12.8 oz (57.5 kg)  05/25/23 136 lb (61.7 kg)    Physical Exam Vitals reviewed.  Constitutional:      General: She is not in acute distress.    Appearance: Normal appearance.  HENT:     Head: Normocephalic and atraumatic.     Right Ear: External ear normal.     Left Ear: External ear normal.  Eyes:     General: No scleral icterus.       Right eye: No discharge.        Left eye: No discharge.     Conjunctiva/sclera: Conjunctivae normal.  Neck:     Thyroid: No thyromegaly.  Cardiovascular:     Rate and Rhythm: Normal rate and regular rhythm.  Pulmonary:     Effort: No respiratory distress.     Breath sounds: Normal breath sounds. No wheezing.  Abdominal:     General: Bowel sounds are normal.     Palpations: Abdomen is soft.     Tenderness: There is no abdominal tenderness.  Musculoskeletal:        General: No swelling or tenderness.     Cervical back: Neck supple. No tenderness.      Comments: Increased pain in neck with rotation of her head.   Lymphadenopathy:     Cervical: No cervical adenopathy.  Skin:    Findings: No erythema or rash.  Neurological:     Mental Status: She is  alert.  Psychiatric:        Mood and Affect: Mood normal.        Behavior: Behavior normal.      Outpatient Encounter Medications as of 08/14/2023  Medication Sig   amLODipine (NORVASC) 2.5 MG tablet Take 1 tablet (2.5 mg total) by mouth daily. In am   ARIPiprazole (ABILIFY) 2 MG tablet Take 1 tablet (2 mg total) by mouth in the morning.   calcium carbonate (OS-CAL) 600 MG TABS tablet Take 600 mg by mouth 2 (two) times daily with a meal.   escitalopram (LEXAPRO) 10 MG tablet Take 1 tablet (10 mg total) by mouth daily with breakfast.   losartan (COZAAR) 25 MG tablet TAKE 1 TABLET BY MOUTH ONCE DAILY   mirtazapine (REMERON) 15 MG tablet Take 1.5 tablets (22.5 mg total) by mouth at bedtime.   pantoprazole (PROTONIX) 20 MG tablet Take 1 tablet (20 mg total) by mouth daily. (Patient not taking: Reported on 08/10/2023)   rosuvastatin (CRESTOR) 20 MG tablet Take 1 tablet (20 mg total) by mouth at bedtime.   VITAMIN D PO Take by mouth daily.   [DISCONTINUED] ALPRAZolam (XANAX) 0.25 MG tablet Take 1 tablet (0.25 mg total) by mouth as directed. Take daily as needed for severe anxiety attacks ,please limit use (Patient not taking: Reported on 08/10/2023)   [DISCONTINUED] diazepam (VALIUM) 5 MG tablet 1 po 30 minutes before ESI (Patient not taking: Reported on 07/31/2023)   [DISCONTINUED] methocarbamol (ROBAXIN) 500 MG tablet 1/2-1 po q day prn (Patient not taking: Reported on 08/10/2023)   [DISCONTINUED] traMADol (ULTRAM) 50 MG tablet Take by mouth. (Patient not taking: Reported on 07/31/2023)   No facility-administered encounter medications on file as of 08/14/2023.     Lab Results  Component Value Date   WBC 9.0 08/10/2023   HGB 12.6 08/10/2023   HCT 37.8 08/10/2023   PLT 387 08/10/2023   GLUCOSE 90  08/10/2023   CHOL 118 08/10/2023   TRIG 60 08/10/2023   HDL 67 08/10/2023   LDLCALC 38 08/10/2023   ALT 24 08/10/2023   AST 29 08/10/2023   NA 132 (L) 08/10/2023   K 3.9 08/10/2023   CL 92 (L) 08/10/2023   CREATININE 0.63 08/10/2023   BUN 12 08/10/2023   CO2 25 08/10/2023   TSH 1.860 08/10/2023   HGBA1C 6.0 (H) 08/10/2023    No results found.     Assessment & Plan:  Hyponatremia -     Sodium; Future  Type 2 diabetes mellitus with hyperglycemia, without long-term current use of insulin (HCC) Assessment & Plan: Weight loss recently.  Discussed.   Lab Results  Component Value Date   HGBA1C 6.0 (H) 08/10/2023      Major depressive disorder, recurrent, severe without psychotic features [facility]) Assessment & Plan: Followed by psych.  Currently on remeron and lexapro.  Recently started abilify. Participating in PHP - 4 hours per day.  Seeing Dr Elna Breslow.  Discuss with Dr Elna Breslow regarding currently symptoms and sleep issues.     Hypertension, unspecified type Assessment & Plan: Continue losartan and amlodipine. Pressures as outlined.  No change in medication.  Follow pressures.  Follow metabolic panel.    Hypercholesterolemia Assessment & Plan: On crestor.  Follow lipid panel and liver function tests.     Acute nonintractable headache, unspecified headache type Assessment & Plan: Previously saw neurology.  Recommended magnesium.  Feel headache now aggravated by her neck issues.  She is not taking any medication for her headache.  Is concerned will react with her other medication. Discussed different treatment options.  Apparently did not previously tolerate steroids.  Trial of scheduled tylenol arthritis as directed.  Gentle use of antiinflammatory medication.  Discussed scanning if persistent.  F/u with neurology.  Continue to see if can control her neck issues.       Dale Loving, MD

## 2023-08-15 ENCOUNTER — Other Ambulatory Visit (HOSPITAL_COMMUNITY): Payer: Medicare PPO | Admitting: Licensed Clinical Social Worker

## 2023-08-15 ENCOUNTER — Encounter (HOSPITAL_COMMUNITY): Payer: Self-pay

## 2023-08-15 ENCOUNTER — Other Ambulatory Visit (HOSPITAL_COMMUNITY): Payer: Medicare PPO

## 2023-08-15 DIAGNOSIS — F332 Major depressive disorder, recurrent severe without psychotic features: Secondary | ICD-10-CM

## 2023-08-15 DIAGNOSIS — F411 Generalized anxiety disorder: Secondary | ICD-10-CM

## 2023-08-15 DIAGNOSIS — F3342 Major depressive disorder, recurrent, in full remission: Secondary | ICD-10-CM

## 2023-08-15 DIAGNOSIS — Z79899 Other long term (current) drug therapy: Secondary | ICD-10-CM | POA: Diagnosis not present

## 2023-08-15 DIAGNOSIS — M1990 Unspecified osteoarthritis, unspecified site: Secondary | ICD-10-CM | POA: Diagnosis not present

## 2023-08-15 DIAGNOSIS — R4589 Other symptoms and signs involving emotional state: Secondary | ICD-10-CM

## 2023-08-15 DIAGNOSIS — E785 Hyperlipidemia, unspecified: Secondary | ICD-10-CM | POA: Diagnosis not present

## 2023-08-15 MED ORDER — ESCITALOPRAM OXALATE 20 MG PO TABS
20.0000 mg | ORAL_TABLET | Freq: Every day | ORAL | 0 refills | Status: DC
Start: 2023-08-15 — End: 2023-08-28

## 2023-08-15 NOTE — Assessment & Plan Note (Signed)
Continue losartan and amlodipine. Pressures as outlined.  No change in medication.  Follow pressures.  Follow metabolic panel.  

## 2023-08-15 NOTE — Assessment & Plan Note (Signed)
Weight loss recently.  Discussed.   Lab Results  Component Value Date   HGBA1C 6.0 (H) 08/10/2023

## 2023-08-15 NOTE — Therapy (Signed)
[facility] PARTIAL HOSPITALIZATION PROGRAM [address] [place], [place], [address] Phone: [phone]   Fax:  [phone]  Occupational Therapy Treatment Virtual Visit via Video Note  I connected with KOLLEEN DURRETTE on 08/15/23 at  8:00 AM EDT by a video enabled telemedicine application and verified that I am speaking with the correct person using two identifiers.  Location: Patient: home Provider: office   I discussed the limitations of evaluation and management by telemedicine and the availability of in person appointments. The patient expressed understanding and agreed to proceed.    The patient was advised to call back or seek an in-person evaluation if the symptoms worsen or if the condition fails to improve as anticipated.  I provided 55 minutes of non-face-to-face time during this encounter.   Patient Details  Name: Angelica Robertson MRN: [id number] Date of Birth: [birth year] No data recorded  Encounter Date: 08/10/2023   OT End of Session - 08/15/23 0858     Visit Number 4    Number of Visits 20    Date for OT Re-Evaluation 09/08/23    OT Start Time 1200    OT Stop Time 1255    OT Time Calculation (min) 55 min             Past Medical History:  Diagnosis Date   Depression 2000   Heart murmur    Hypercholesterolemia Osteoporosis   Hyperlipemia     Past Surgical History:  Procedure Laterality Date   ABDOMINAL HYSTERECTOMY  1980   COLONOSCOPY WITH PROPOFOL N/A 03/05/2018   Procedure: COLONOSCOPY WITH PROPOFOL;  Surgeon: Toledo, Boykin Nearing, MD;  Location: [facility] ENDOSCOPY;  Service: Endoscopy;  Laterality: N/A;   COLONOSCOPY WITH PROPOFOL N/A 04/23/2023   Procedure: COLONOSCOPY WITH PROPOFOL;  Surgeon: Regis Bill, MD;  Location: [facility] ENDOSCOPY;  Service: Endoscopy;  Laterality: N/A;    There were no vitals filed for this visit.   Subjective Assessment - 08/15/23 0857     Currently in Pain? No/denies    Pain Score 0-No pain                 Group Session:  S: Feeling a bit better today.   O: The primary objective of this group therapy session is to equip participants with practical strategies and coping mechanisms to effectively manage accumulated tasks that seem insurmountable due to mental health challenges such as depression and anxiety. The group aims to build a supportive environment wherein individuals can openly discuss their struggles, share experiences, and learn from one another. The session's strategies will encompass goal setting, time management, task breakdown, creating conducive environments, and mindfulness practices. We aim to help participants perceive tasks as manageable units rather than overwhelming piles and encourage an approach of progress over perfection.   A: Patient demonstrated active engagement throughout the session. They contributed valuable insights during discussions, asked relevant questions, and showed enthusiasm towards learning new strategies. Their interaction with other group members was respectful and empathetic, contributing to a supportive group dynamic. Patient appeared to resonate with the strategies of "breaking it down" and "creating a conducive environment," and they shared plans to incorporate these strategies into their daily routine. Their active participation, willingness to share personal experiences, and acceptance of new strategies demonstrate a strong benefit from this therapy session.    P: Continue to attend PHP OT group sessions 5x week for 4 weeks to promote daily structure, social engagement, and opportunities to develop and utilize adaptive  strategies to maximize functional performance in preparation for safe transition and integration back into school, work, and the community. Plan to address topic of tbd in next OT group session.                   OT Education - 08/15/23 0857     Education Details Overcoming Task Accumulation               OT Short Term Goals - 08/09/23 0950       OT SHORT TERM GOAL #1   Title Client will develop and utilize a personalized coping toolbox containing at least five coping strategies to manage challenging situations, demonstrating their use in real-life scenarios by the end of therapy.    Time 4    Period Weeks    Status On-going    Target Date 09/08/23      OT SHORT TERM GOAL #2   Title Patient will independently apply psychosocial skills and coping mechanisms to her daily activities in order to function independently.    Status On-going      OT SHORT TERM GOAL #3   Title Patient will be educated on strategies to improve psychosocial skills needed to participate fully in all daily, work, and leisure activities.    Status On-going                      Plan - 08/15/23 0859     Psychosocial Skills Coping Strategies;Habits;Interpersonal Interaction;Routines and Behaviors             Patient will benefit from skilled therapeutic intervention in order to improve the following deficits and impairments:       Psychosocial Skills: Coping Strategies, Habits, Interpersonal Interaction, Routines and Behaviors   Visit Diagnosis: Difficulty coping    Problem List Patient Active Problem List   Diagnosis Date Noted   Severe episode of recurrent major depressive disorder, without psychotic features (HCC) 07/31/2023   At risk for prolonged QT interval syndrome 07/31/2023   Back pain 05/25/2023   Hemorrhoid 05/07/2023   Laryngopharyngeal reflux 10/10/2022   Hair loss 09/22/2022   Nausea 09/22/2022   High risk medication use 08/22/2022   Acid reflux 08/20/2022   Head injury 06/08/2022   Headache 06/08/2022   Drug reaction 06/07/2022   Hypertension 12/01/2020   Generalized anxiety disorder 01/12/2020   B12 deficiency 12/23/2019   Leukocytosis 12/23/2019   Major depressive disorder, recurrent, severe without psychotic features (HCC) 12/18/2019   MDD (major  depressive disorder), recurrent, in partial remission (HCC) 12/10/2019   Restless leg syndrome 06/02/2019   Colon polyps 01/01/2018   Syncope 12/30/2017   Type 2 diabetes mellitus with hyperglycemia (HCC) 08/26/2015   Cervicalgia 05/30/2015   Healthcare maintenance 02/28/2015   Osteopenia 08/17/2014   Elevated blood pressure reading 06/14/2014   Sinus tachycardia 11/15/2012   Hypercholesterolemia 10/10/2012    Ted Mcalpine, OT 08/15/2023, 9:00 AM  Kerrin Champagne, OT   [facility] HOSPITALIZATION PROGRAM [address] [place], [place], [address] Phone: [phone]   Fax:  [phone]  Name: ANSLEI KLAMER MRN: [id number] Date of Birth: 04/02/1946

## 2023-08-15 NOTE — Assessment & Plan Note (Signed)
On crestor.  Follow lipid panel and liver function tests.   

## 2023-08-15 NOTE — Assessment & Plan Note (Signed)
Previously saw neurology.  Recommended magnesium.  Feel headache now aggravated by her neck issues.  She is not taking any medication for her headache.  Is concerned will react with her other medication. Discussed different treatment options.  Apparently did not previously tolerate steroids.  Trial of scheduled tylenol arthritis as directed.  Gentle use of antiinflammatory medication.  Discussed scanning if persistent.  F/u with neurology.  Continue to see if can control her neck issues.

## 2023-08-15 NOTE — Progress Notes (Signed)
Spoke with patient via Teams video call, used 2 identifiers to correctly identify patient. States that groups are really hard for her because she has a bulging disk in her neck that makes sitting for hours painful. Otherwise group is OK. On scale 1-10 as 10 being worst she rates depression at 6/7 and anxiety at 0. Denies SI/HI and AV hallucinations. Flat depressed affect. Pleasant and cooperative.  No side effects from medications.
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
## 2023-08-15 NOTE — Assessment & Plan Note (Signed)
Followed by psych.  Currently on remeron and lexapro.  Recently started abilify. Participating in PHP - 4 hours per day.  Seeing Dr Elna Breslow.  Discuss with Dr Elna Breslow regarding currently symptoms and sleep issues.

## 2023-08-16 ENCOUNTER — Other Ambulatory Visit (HOSPITAL_COMMUNITY): Payer: Medicare PPO

## 2023-08-16 ENCOUNTER — Encounter (HOSPITAL_COMMUNITY): Payer: Self-pay

## 2023-08-16 ENCOUNTER — Other Ambulatory Visit (HOSPITAL_BASED_OUTPATIENT_CLINIC_OR_DEPARTMENT_OTHER): Payer: Medicare PPO | Admitting: Licensed Clinical Social Worker

## 2023-08-16 DIAGNOSIS — R4589 Other symptoms and signs involving emotional state: Secondary | ICD-10-CM

## 2023-08-16 DIAGNOSIS — F411 Generalized anxiety disorder: Secondary | ICD-10-CM

## 2023-08-16 DIAGNOSIS — F332 Major depressive disorder, recurrent severe without psychotic features: Secondary | ICD-10-CM

## 2023-08-16 NOTE — Therapy (Signed)
[facility] PARTIAL HOSPITALIZATION PROGRAM [address] [place], [place], [address] Phone: [phone]   Fax:  [phone]  Occupational Therapy Treatment Virtual Visit via Video Note  I connected with Angelica Robertson on 08/16/23 at  8:00 AM EDT by a video enabled telemedicine application and verified that I am speaking with the correct person using two identifiers.  Location: Patient: home Provider: office   I discussed the limitations of evaluation and management by telemedicine and the availability of in person appointments. The patient expressed understanding and agreed to proceed.    The patient was advised to call back or seek an in-person evaluation if the symptoms worsen or if the condition fails to improve as anticipated.  I provided 55 minutes of non-face-to-face time during this encounter.   Patient Details  Name: Angelica Robertson MRN: [id number] Date of Birth: Apr 29, 1946 No data recorded  Encounter Date: 08/15/2023   OT End of Session - 08/16/23 1052     Visit Number 6    Number of Visits 20    Date for OT Re-Evaluation 09/08/23    OT Start Time 1200    OT Stop Time 1255    OT Time Calculation (min) 55 min             Past Medical History:  Diagnosis Date   Depression 2000   Heart murmur    Hypercholesterolemia Osteoporosis   Hyperlipemia     Past Surgical History:  Procedure Laterality Date   ABDOMINAL HYSTERECTOMY  1980   COLONOSCOPY WITH PROPOFOL N/A 03/05/2018   Procedure: COLONOSCOPY WITH PROPOFOL;  Surgeon: Toledo, Boykin Nearing, MD;  Location: [facility] ENDOSCOPY;  Service: Endoscopy;  Laterality: N/A;   COLONOSCOPY WITH PROPOFOL N/A 04/23/2023   Procedure: COLONOSCOPY WITH PROPOFOL;  Surgeon: Regis Bill, MD;  Location: [facility] ENDOSCOPY;  Service: Endoscopy;  Laterality: N/A;    There were no vitals filed for this visit.   Subjective Assessment - 08/16/23 1051     Currently in Pain? No/denies    Pain Score 0-No pain                   Group Session:  S: Doing better today.   O: Today's OT group session focused on reframing daily care tasks as "morally neutral" in order to alleviate feelings of guilt or shame often associated with unmet responsibilities. The goal of this session was to help patients recognize that tasks such as cleaning, laundry, and self-care are not reflections of personal worth but are simply functional activities that can be approached in smaller, manageable steps. Strategies such as micro-goals, compassionate reframing, and the 5-minute rule were introduced to help patients develop a more balanced and self-compassionate approach to managing these tasks.  A: The patient was present during the group session but demonstrated minimal verbal participation. They listened to the discussion and did not engage in personal reflection or share experiences with the group. While the patient appeared attentive, they did not provide verbal or non-verbal feedback indicating readiness to adopt the strategies discussed. Further support may be needed to encourage active participation and application of the concepts in their daily life.    P: Continue to attend PHP OT group sessions 5x week for 4 weeks to promote daily structure, social engagement, and opportunities to develop and utilize adaptive strategies to maximize functional performance in preparation for safe transition and integration back into school, work, and the community. Plan to address topic of tbd in next  OT group session.                 OT Education - 08/16/23 1051     Education Details Reframing Care Tasks              OT Short Term Goals - 08/09/23 0950       OT SHORT TERM GOAL #1   Title Client will develop and utilize a personalized coping toolbox containing at least five coping strategies to manage challenging situations, demonstrating their use in real-life scenarios by the end of therapy.    Time 4     Period Weeks    Status On-going    Target Date 09/08/23      OT SHORT TERM GOAL #2   Title Patient will independently apply psychosocial skills and coping mechanisms to her daily activities in order to function independently.    Status On-going      OT SHORT TERM GOAL #3   Title Patient will be educated on strategies to improve psychosocial skills needed to participate fully in all daily, work, and leisure activities.    Status On-going                      Plan - 08/16/23 1052     Psychosocial Skills Coping Strategies;Habits;Interpersonal Interaction;Routines and Behaviors             Patient will benefit from skilled therapeutic intervention in order to improve the following deficits and impairments:       Psychosocial Skills: Coping Strategies, Habits, Interpersonal Interaction, Routines and Behaviors   Visit Diagnosis: Difficulty coping    Problem List Patient Active Problem List   Diagnosis Date Noted   Severe episode of recurrent major depressive disorder, without psychotic features (HCC) 07/31/2023   At risk for prolonged QT interval syndrome 07/31/2023   Back pain 05/25/2023   Hemorrhoid 05/07/2023   Laryngopharyngeal reflux 10/10/2022   Hair loss 09/22/2022   Nausea 09/22/2022   High risk medication use 08/22/2022   Acid reflux 08/20/2022   Head injury 06/08/2022   Headache 06/08/2022   Drug reaction 06/07/2022   Hypertension 12/01/2020   Generalized anxiety disorder 01/12/2020   B12 deficiency 12/23/2019   Leukocytosis 12/23/2019   Major depressive disorder, recurrent, severe without psychotic features (HCC) 12/18/2019   MDD (major depressive disorder), recurrent, in partial remission (HCC) 12/10/2019   Restless leg syndrome 06/02/2019   Colon polyps 01/01/2018   Syncope 12/30/2017   Type 2 diabetes mellitus with hyperglycemia (HCC) 08/26/2015   Cervicalgia 05/30/2015   Healthcare maintenance 02/28/2015   Osteopenia 08/17/2014    Elevated blood pressure reading 06/14/2014   Sinus tachycardia 11/15/2012   Hypercholesterolemia 10/10/2012    Ted Mcalpine, OT 08/16/2023, 10:53 AM  Kerrin Champagne, OT   [facility] HOSPITALIZATION PROGRAM [address] [place], [place], [address] Phone: [phone]   Fax:  [phone]  Name: JALAIA HUGULEY MRN: [id number] Date of Birth: 12/27/1945

## 2023-08-16 NOTE — Telephone Encounter (Signed)
Cln returned pt's call per Boneta Lucks. Pt asks about exact out-of-pocket costs and states she was told she will have a $20 copay and asked if that is per day or per provider. Cln explained that PHP staff are not told exact rates and copays should be per day, but cln is not 100% sure. Cln informed pt she will be billed after the program and that [facility] offers payment plans. Pt verbalized understanding and expressed appreciation.

## 2023-08-16 NOTE — Therapy (Signed)
[facility] PARTIAL HOSPITALIZATION PROGRAM [address] [place], [place], [address] Phone: [phone]   Fax:  [phone]  Occupational Therapy Treatment Virtual Visit via Video Note  I connected with TAMAKI WILKING on 08/16/23 at  8:00 AM EDT by a video enabled telemedicine application and verified that I am speaking with the correct person using two identifiers.  Location: Patient: home Provider: office   I discussed the limitations of evaluation and management by telemedicine and the availability of in person appointments. The patient expressed understanding and agreed to proceed.    The patient was advised to call back or seek an in-person evaluation if the symptoms worsen or if the condition fails to improve as anticipated.  I provided 55 minutes of non-face-to-face time during this encounter.   Patient Details  Name: Angelica Robertson MRN: [id number] Date of Birth: 17-Feb-1946 No data recorded  Encounter Date: 08/14/2023   OT End of Session - 08/16/23 1039     Visit Number 5    Number of Visits 20    Date for OT Re-Evaluation 09/08/23    OT Start Time 1200    OT Stop Time 1255    OT Time Calculation (min) 55 min             Past Medical History:  Diagnosis Date   Depression 2000   Heart murmur    Hypercholesterolemia Osteoporosis   Hyperlipemia     Past Surgical History:  Procedure Laterality Date   ABDOMINAL HYSTERECTOMY  1980   COLONOSCOPY WITH PROPOFOL N/A 03/05/2018   Procedure: COLONOSCOPY WITH PROPOFOL;  Surgeon: Toledo, Boykin Nearing, MD;  Location: [facility] END[facility];  Service: Endoscopy;  Laterality: N/A;   COLONOSCOPY WITH PROPOFOL N/A 04/23/2023   Procedure: COLONOSCOPY WITH PROPOFOL;  Surgeon: Regis Bill, MD;  Location: [facility] ENDOSCOPY;  Service: Endoscopy;  Laterality: N/A;    There were no vitals filed for this visit.   Subjective Assessment - 08/16/23 1039     Currently in Pain? No/denies    Pain Score 0-No pain                    Group Session:  S: Doing good today.    O: The session centered on the neuroscientific underpinnings of motivation and its inverse relationship with procrastination. Through a review of current scientific literature, the group examined:  The function of dopamine as a critical neurotransmitter in the motivation circuitry of the brain. The physiological and psychological aspects of procrastination, including the impact of dopamine on the tendency to delay tasks. Strategies for leveraging an understanding of dopamine's role to enhance motivation, address procrastination, and foster productive habits. The objective was to provide a framework for participants to understand the neurochemical dynamics of motivation, relate these to personal experiences of procrastination, and apply evidence-based methods to improve focus and drive in both professional and personal contexts.   A: The patient attended the session and was present throughout the discussion on the neuroscientific aspects of motivation and procrastination. They displayed passive engagement, listening to the material and occasionally participating in the dialogue. The patient shows a foundational understanding of the concepts but may require additional encouragement to actively apply the strategies to their own habits and routines. Their current level of passive engagement suggests they are processing the information, and with further support, may begin to integrate these insights into actionable changes.   P: Continue to attend PHP OT group sessions 5x week for 4 weeks  to promote daily structure, social engagement, and opportunities to develop and utilize adaptive strategies to maximize functional performance in preparation for safe transition and integration back into school, work, and the community. Plan to address topic of pt 2 in next OT group session.                OT Education - 08/16/23 1039      Education Details Procrastination 1              OT Short Term Goals - 08/09/23 0950       OT SHORT TERM GOAL #1   Title Client will develop and utilize a personalized coping toolbox containing at least five coping strategies to manage challenging situations, demonstrating their use in real-life scenarios by the end of therapy.    Time 4    Period Weeks    Status On-going    Target Date 09/08/23      OT SHORT TERM GOAL #2   Title Patient will independently apply psychosocial skills and coping mechanisms to her daily activities in order to function independently.    Status On-going      OT SHORT TERM GOAL #3   Title Patient will be educated on strategies to improve psychosocial skills needed to participate fully in all daily, work, and leisure activities.    Status On-going                      Plan - 08/16/23 1039     Psychosocial Skills Coping Strategies;Habits;Interpersonal Interaction;Routines and Behaviors             Patient will benefit from skilled therapeutic intervention in order to improve the following deficits and impairments:       Psychosocial Skills: Coping Strategies, Habits, Interpersonal Interaction, Routines and Behaviors   Visit Diagnosis: Difficulty coping    Problem List Patient Active Problem List   Diagnosis Date Noted   Severe episode of recurrent major depressive disorder, without psychotic features (HCC) 07/31/2023   At risk for prolonged QT interval syndrome 07/31/2023   Back pain 05/25/2023   Hemorrhoid 05/07/2023   Laryngopharyngeal reflux 10/10/2022   Hair loss 09/22/2022   Nausea 09/22/2022   High risk medication use 08/22/2022   Acid reflux 08/20/2022   Head injury 06/08/2022   Headache 06/08/2022   Drug reaction 06/07/2022   Hypertension 12/01/2020   Generalized anxiety disorder 01/12/2020   B12 deficiency 12/23/2019   Leukocytosis 12/23/2019   Major depressive disorder, recurrent, severe without  psychotic features (HCC) 12/18/2019   MDD (major depressive disorder), recurrent, in partial remission (HCC) 12/10/2019   Restless leg syndrome 06/02/2019   Colon polyps 01/01/2018   Syncope 12/30/2017   Type 2 diabetes mellitus with hyperglycemia (HCC) 08/26/2015   Cervicalgia 05/30/2015   Healthcare maintenance 02/28/2015   Osteopenia 08/17/2014   Elevated blood pressure reading 06/14/2014   Sinus tachycardia 11/15/2012   Hypercholesterolemia 10/10/2012    Ted Mcalpine, OT 08/16/2023, 10:40 AM  Kerrin Champagne, OT   [facility] HOSPITALIZATION PROGRAM [address] [place], [place], [address] Phone: [phone]   Fax:  [phone]  Name: JERNIE KOLACZ MRN: [id number] Date of Birth: [birth year]

## 2023-08-17 ENCOUNTER — Other Ambulatory Visit (HOSPITAL_COMMUNITY): Payer: Medicare PPO

## 2023-08-17 ENCOUNTER — Other Ambulatory Visit (HOSPITAL_COMMUNITY): Payer: Medicare PPO | Admitting: Licensed Clinical Social Worker

## 2023-08-17 DIAGNOSIS — R4589 Other symptoms and signs involving emotional state: Secondary | ICD-10-CM

## 2023-08-17 DIAGNOSIS — M1990 Unspecified osteoarthritis, unspecified site: Secondary | ICD-10-CM | POA: Diagnosis not present

## 2023-08-17 DIAGNOSIS — F332 Major depressive disorder, recurrent severe without psychotic features: Secondary | ICD-10-CM

## 2023-08-17 DIAGNOSIS — E785 Hyperlipidemia, unspecified: Secondary | ICD-10-CM | POA: Diagnosis not present

## 2023-08-17 DIAGNOSIS — F3342 Major depressive disorder, recurrent, in full remission: Secondary | ICD-10-CM | POA: Diagnosis not present

## 2023-08-17 DIAGNOSIS — F411 Generalized anxiety disorder: Secondary | ICD-10-CM

## 2023-08-17 DIAGNOSIS — Z79899 Other long term (current) drug therapy: Secondary | ICD-10-CM | POA: Diagnosis not present

## 2023-08-20 ENCOUNTER — Encounter (HOSPITAL_COMMUNITY): Payer: Self-pay

## 2023-08-20 ENCOUNTER — Other Ambulatory Visit (HOSPITAL_COMMUNITY): Payer: Medicare PPO | Admitting: Licensed Clinical Social Worker

## 2023-08-20 ENCOUNTER — Other Ambulatory Visit (HOSPITAL_COMMUNITY): Payer: Medicare PPO

## 2023-08-20 DIAGNOSIS — F332 Major depressive disorder, recurrent severe without psychotic features: Secondary | ICD-10-CM

## 2023-08-20 DIAGNOSIS — M1990 Unspecified osteoarthritis, unspecified site: Secondary | ICD-10-CM | POA: Diagnosis not present

## 2023-08-20 DIAGNOSIS — F411 Generalized anxiety disorder: Secondary | ICD-10-CM | POA: Diagnosis not present

## 2023-08-20 DIAGNOSIS — E785 Hyperlipidemia, unspecified: Secondary | ICD-10-CM | POA: Diagnosis not present

## 2023-08-20 DIAGNOSIS — Z79899 Other long term (current) drug therapy: Secondary | ICD-10-CM | POA: Diagnosis not present

## 2023-08-20 DIAGNOSIS — R4589 Other symptoms and signs involving emotional state: Secondary | ICD-10-CM

## 2023-08-20 DIAGNOSIS — F3342 Major depressive disorder, recurrent, in full remission: Secondary | ICD-10-CM | POA: Diagnosis not present

## 2023-08-20 NOTE — Therapy (Signed)
[facility] PARTIAL HOSPITALIZATION PROGRAM [address] [place], [place], [address] Phone: [phone]   Fax:  [phone]  Occupational Therapy Treatment Virtual Visit via Video Note  I connected with Angelica Robertson on 08/20/23 at  8:00 AM EDT by a video enabled telemedicine application and verified that I am speaking with the correct person using two identifiers.  Location: Patient: home Provider: office   I discussed the limitations of evaluation and management by telemedicine and the availability of in person appointments. The patient expressed understanding and agreed to proceed.    The patient was advised to call back or seek an in-person evaluation if the symptoms worsen or if the condition fails to improve as anticipated.  I provided 55 minutes of non-face-to-face time during this encounter.   Patient Details  Name: Angelica Robertson MRN: [id number] Date of Birth: 02/18/1946 No data recorded  Encounter Date: 08/20/2023   OT End of Session - 08/20/23 1832     Visit Number 9    Number of Visits 20    Date for OT Re-Evaluation 09/08/23    OT Start Time 1200    OT Stop Time 1255    OT Time Calculation (min) 55 min             Past Medical History:  Diagnosis Date   Depression 2000   Heart murmur    Hypercholesterolemia Osteoporosis   Hyperlipemia     Past Surgical History:  Procedure Laterality Date   ABDOMINAL HYSTERECTOMY  1980   COLONOSCOPY WITH PROPOFOL N/A 03/05/2018   Procedure: COLONOSCOPY WITH PROPOFOL;  Surgeon: Toledo, Boykin Nearing, MD;  Location: [facility];  Service: Endoscopy;  Laterality: N/A;   COLONOSCOPY WITH PROPOFOL N/A 04/23/2023   Procedure: COLONOSCOPY WITH PROPOFOL;  Surgeon: Regis Bill, MD;  Location: [facility];  Service: Endoscopy;  Laterality: N/A;    There were no vitals filed for this visit.   Subjective Assessment - 08/20/23 1831     Currently in Pain? No/denies    Pain Score 0-No pain                Group Session:  S: Doing okay today.   O: Today's group focused on improving attention and concentration using creative strategies tailored to individuals with attention and concentration difficulties due to various external distractions or sensory input. Techniques discussed included: Mindful listening and using visual anchors during conversations. Creating a personal focus zone with sensory tools (e.g., noise-canceling headphones, fidget tools). Incorporating movement (e.g., small fidgeting, rhythmic focus strategies) to aid concentration. Leveraging personal interests to enhance focus during tasks or conversations. Engaging in mind-body techniques like breathwork with movement or body scanning to ground attention. Modifying the environment (e.g., task-oriented zones, adjusting lighting) to reduce distractions. Utilizing technology, such as attention-training apps or screen modifications, for improved focus. Reframing distractions as opportunities to redirect attention rather than failures.   A:  The patient demonstrated active participation by asking questions, reflecting on personal challenges with attention, and discussing how specific techniques (e.g., mindful listening, sensory tools) could be integrated into their daily life. The patient appears motivated to apply the strategies discussed and verbalized a better understanding of how engaging personal interests can help improve focus.   P: Continue to attend PHP OT group sessions 5x week for 4 weeks to promote daily structure, social engagement, and opportunities to develop and utilize adaptive strategies to maximize functional performance in preparation for safe transition and integration back into school,  work, and the community. Plan to address topic of tbd in next OT group session.                     OT Education - 08/20/23 1832     Education Details Improving Attention and Concentration 2               OT Short Term Goals - 08/09/23 0950       OT SHORT TERM GOAL #1   Title Client will develop and utilize a personalized coping toolbox containing at least five coping strategies to manage challenging situations, demonstrating their use in real-life scenarios by the end of therapy.    Time 4    Period Weeks    Status On-going    Target Date 09/08/23      OT SHORT TERM GOAL #2   Title Patient will independently apply psychosocial skills and coping mechanisms to her daily activities in order to function independently.    Status On-going      OT SHORT TERM GOAL #3   Title Patient will be educated on strategies to improve psychosocial skills needed to participate fully in all daily, work, and leisure activities.    Status On-going                      Plan - 08/20/23 1832     Psychosocial Skills Coping Strategies;Habits;Interpersonal Interaction;Routines and Behaviors             Patient will benefit from skilled therapeutic intervention in order to improve the following deficits and impairments:       Psychosocial Skills: Coping Strategies, Habits, Interpersonal Interaction, Routines and Behaviors   Visit Diagnosis: Difficulty coping    Problem List Patient Active Problem List   Diagnosis Date Noted   Severe episode of recurrent major depressive disorder, without psychotic features (HCC) 07/31/2023   At risk for prolonged QT interval syndrome 07/31/2023   Back pain 05/25/2023   Hemorrhoid 05/07/2023   Laryngopharyngeal reflux 10/10/2022   Hair loss 09/22/2022   Nausea 09/22/2022   High risk medication use 08/22/2022   Acid reflux 08/20/2022   Head injury 06/08/2022   Headache 06/08/2022   Drug reaction 06/07/2022   Hypertension 12/01/2020   Generalized anxiety disorder 01/12/2020   B12 deficiency 12/23/2019   Leukocytosis 12/23/2019   Major depressive disorder, recurrent, severe without psychotic features (HCC) 12/18/2019   MDD  (major depressive disorder), recurrent, in partial remission (HCC) 12/10/2019   Restless leg syndrome 06/02/2019   Colon polyps 01/01/2018   Syncope 12/30/2017   Type 2 diabetes mellitus with hyperglycemia (HCC) 08/26/2015   Cervicalgia 05/30/2015   Healthcare maintenance 02/28/2015   Osteopenia 08/17/2014   Elevated blood pressure reading 06/14/2014   Sinus tachycardia 11/15/2012   Hypercholesterolemia 10/10/2012    Ted Mcalpine, OT 08/20/2023, 6:32 PM  Kerrin Champagne, OT   [facility] HOSPITALIZATION PROGRAM [address] [place], [place], [address] Phone: [phone]   Fax:  [phone]  Name: ARDETH SIELING MRN: [id number] Date of Birth: 1945-12-22

## 2023-08-20 NOTE — Therapy (Signed)
[facility] PARTIAL HOSPITALIZATION PROGRAM [address] [place], [place], [address] Phone: [phone]   Fax:  [phone]  Occupational Therapy Treatment Virtual Visit via Video Note  I connected with Angelica Robertson on 08/20/23 at  8:00 AM EDT by a video enabled telemedicine application and verified that I am speaking with the correct person using two identifiers.  Location: Patient: home Provider: office   I discussed the limitations of evaluation and management by telemedicine and the availability of in person appointments. The patient expressed understanding and agreed to proceed.    The patient was advised to call back or seek an in-person evaluation if the symptoms worsen or if the condition fails to improve as anticipated.  I provided 55 minutes of non-face-to-face time during this encounter.   Patient Details  Name: Angelica Robertson MRN: [id number] Date of Birth: 1946/04/27 No data recorded  Encounter Date: 08/16/2023   OT End of Session - 08/20/23 1653     Visit Number 7    Number of Visits 20    Date for OT Re-Evaluation 09/08/23    OT Start Time 1200    OT Stop Time 1255    OT Time Calculation (min) 55 min             Past Medical History:  Diagnosis Date   Depression 2000   Heart murmur    Hypercholesterolemia Osteoporosis   Hyperlipemia     Past Surgical History:  Procedure Laterality Date   ABDOMINAL HYSTERECTOMY  1980   COLONOSCOPY WITH PROPOFOL N/A 03/05/2018   Procedure: COLONOSCOPY WITH PROPOFOL;  Surgeon: Toledo, Boykin Nearing, MD;  Location: [facility] END[facility];  Service: Endoscopy;  Laterality: N/A;   COLONOSCOPY WITH PROPOFOL N/A 04/23/2023   Procedure: COLONOSCOPY WITH PROPOFOL;  Surgeon: Regis Bill, MD;  Location: [facility] ENDOS[facility];  Service: Endoscopy;  Laterality: N/A;    There were no vitals filed for this visit.   Subjective Assessment - 08/20/23 1653     Currently in Pain? No/denies    Pain Score 0-No pain                Group Session:  S: A bit better today  O: The session centered on the neuroscientific underpinnings of motivation and its inverse relationship with procrastination. Through a review of current scientific literature, the group examined:  The function of dopamine as a critical neurotransmitter in the motivation circuitry of the brain. The physiological and psychological aspects of procrastination, including the impact of dopamine on the tendency to delay tasks. Strategies for leveraging an understanding of dopamine's role to enhance motivation, address procrastination, and foster productive habits. The objective was to provide a framework for participants to understand the neurochemical dynamics of motivation, relate these to personal experiences of procrastination, and apply evidence-based methods to improve focus and drive in both professional and personal contexts.   A: The patient attended the session and was present throughout the discussion on the neuroscientific aspects of motivation and procrastination. They displayed passive engagement, listening to the material and occasionally participating in the dialogue. The patient shows a foundational understanding of the concepts but may require additional encouragement to actively apply the strategies to their own habits and routines. Their current level of passive engagement suggests they are processing the information, and with further support, may begin to integrate these insights into actionable changes.   P: Continue to attend PHP OT group sessions 5x week for 4 weeks to promote daily structure, social  engagement, and opportunities to develop and utilize adaptive strategies to maximize functional performance in preparation for safe transition and integration back into school, work, and the community. Plan to address topic of tbd in next OT group session.                    OT Education - 08/20/23 1653      Education Details Procrastionation              OT Short Term Goals - 08/09/23 0950       OT SHORT TERM GOAL #1   Title Client will develop and utilize a personalized coping toolbox containing at least five coping strategies to manage challenging situations, demonstrating their use in real-life scenarios by the end of therapy.    Time 4    Period Weeks    Status On-going    Target Date 09/08/23      OT SHORT TERM GOAL #2   Title Patient will independently apply psychosocial skills and coping mechanisms to her daily activities in order to function independently.    Status On-going      OT SHORT TERM GOAL #3   Title Patient will be educated on strategies to improve psychosocial skills needed to participate fully in all daily, work, and leisure activities.    Status On-going                       Patient will benefit from skilled therapeutic intervention in order to improve the following deficits and impairments:           Visit Diagnosis: Difficulty coping    Problem List Patient Active Problem List   Diagnosis Date Noted   Severe episode of recurrent major depressive disorder, without psychotic features (HCC) 07/31/2023   At risk for prolonged QT interval syndrome 07/31/2023   Back pain 05/25/2023   Hemorrhoid 05/07/2023   Laryngopharyngeal reflux 10/10/2022   Hair loss 09/22/2022   Nausea 09/22/2022   High risk medication use 08/22/2022   Acid reflux 08/20/2022   Head injury 06/08/2022   Headache 06/08/2022   Drug reaction 06/07/2022   Hypertension 12/01/2020   Generalized anxiety disorder 01/12/2020   B12 deficiency 12/23/2019   Leukocytosis 12/23/2019   Major depressive disorder, recurrent, severe without psychotic features (HCC) 12/18/2019   MDD (major depressive disorder), recurrent, in partial remission (HCC) 12/10/2019   Restless leg syndrome 06/02/2019   Colon polyps 01/01/2018   Syncope 12/30/2017   Type 2 diabetes mellitus  with hyperglycemia (HCC) 08/26/2015   Cervicalgia 05/30/2015   Healthcare maintenance 02/28/2015   Osteopenia 08/17/2014   Elevated blood pressure reading 06/14/2014   Sinus tachycardia 11/15/2012   Hypercholesterolemia 10/10/2012    Ted Mcalpine, OT 08/20/2023, 4:54 PM  Kerrin Champagne, OT   [facility] HOSPITALIZATION PROGRAM [address] [place], [place], [address] Phone: [phone]   Fax:  [phone]  Name: Angelica Robertson MRN: [id number] Date of Birth: 02-03-1946

## 2023-08-20 NOTE — Therapy (Signed)
[facility] PARTIAL HOSPITALIZATION PROGRAM [address] [place], [place], [address] Phone: [phone]   Fax:  [phone]  Occupational Therapy Treatment Virtual Visit via Video Note  I connected with Angelica Robertson on 08/20/23 at  8:00 AM EDT by a video enabled telemedicine application and verified that I am speaking with the correct person using two identifiers.  Location: Patient: home Provider: office   I discussed the limitations of evaluation and management by telemedicine and the availability of in person appointments. The patient expressed understanding and agreed to proceed.    The patient was advised to call back or seek an in-person evaluation if the symptoms worsen or if the condition fails to improve as anticipated.  I provided 55 minutes of non-face-to-face time during this encounter.   Patient Details  Name: Angelica Robertson MRN: [id number] Date of Birth: 1946-03-20 No data recorded  Encounter Date: 08/17/2023   OT End of Session - 08/20/23 1818     Visit Number 8    Number of Visits 20    Date for OT Re-Evaluation 09/08/23    OT Start Time 1200    OT Stop Time 1255    OT Time Calculation (min) 55 min             Past Medical History:  Diagnosis Date   Depression 2000   Heart murmur    Hypercholesterolemia Osteoporosis   Hyperlipemia     Past Surgical History:  Procedure Laterality Date   ABDOMINAL HYSTERECTOMY  1980   COLONOSCOPY WITH PROPOFOL N/A 03/05/2018   Procedure: COLONOSCOPY WITH PROPOFOL;  Surgeon: Toledo, Boykin Nearing, MD;  Location: [facility] ENDOSCOPY;  Service: Endoscopy;  Laterality: N/A;   COLONOSCOPY WITH PROPOFOL N/A 04/23/2023   Procedure: COLONOSCOPY WITH PROPOFOL;  Surgeon: Regis Bill, MD;  Location: [facility] ENDOSCOPY;  Service: Endoscopy;  Laterality: N/A;    There were no vitals filed for this visit.   Subjective Assessment - 08/20/23 1817     Currently in Pain? No/denies    Pain Score 0-No pain                 Group Session:  S: doing okay today.  O: Today's group focused on improving attention and concentration using creative strategies tailored to individuals with attention and concentration difficulties due to various external distractions or sensory input. Techniques discussed included: Mindful listening and using visual anchors during conversations. Creating a personal focus zone with sensory tools (e.g., noise-canceling headphones, fidget tools). Incorporating movement (e.g., small fidgeting, rhythmic focus strategies) to aid concentration. Leveraging personal interests to enhance focus during tasks or conversations. Engaging in mind-body techniques like breathwork with movement or body scanning to ground attention. Modifying the environment (e.g., task-oriented zones, adjusting lighting) to reduce distractions. Utilizing technology, such as attention-training apps or screen modifications, for improved focus. Reframing distractions as opportunities to redirect attention rather than failures.   A: The patient displayed limited engagement during the session, providing minimal feedback and interaction. While they listened to the information provided, there was little indication of personal reflection or active involvement in discussing how the strategies could be implemented in their own life. The patient may require further support in applying the concepts discussed to their routine and in developing personalized strategies to improve attention and concentration.   P: Continue to attend PHP OT group sessions 5x week for 4 weeks to promote daily structure, social engagement, and opportunities to develop and utilize adaptive strategies to maximize functional  performance in preparation for safe transition and integration back into school, work, and the community. Plan to address topic of tbd in next OT group session.                   OT Education - 08/20/23  1817     Education Details Improving Attention and Concentration              OT Short Term Goals - 08/09/23 0950       OT SHORT TERM GOAL #1   Title Client will develop and utilize a personalized coping toolbox containing at least five coping strategies to manage challenging situations, demonstrating their use in real-life scenarios by the end of therapy.    Time 4    Period Weeks    Status On-going    Target Date 09/08/23      OT SHORT TERM GOAL #2   Title Patient will independently apply psychosocial skills and coping mechanisms to her daily activities in order to function independently.    Status On-going      OT SHORT TERM GOAL #3   Title Patient will be educated on strategies to improve psychosocial skills needed to participate fully in all daily, work, and leisure activities.    Status On-going                      Plan - 08/20/23 1819     Psychosocial Skills Coping Strategies;Habits;Interpersonal Interaction;Routines and Behaviors             Patient will benefit from skilled therapeutic intervention in order to improve the following deficits and impairments:       Psychosocial Skills: Coping Strategies, Habits, Interpersonal Interaction, Routines and Behaviors   Visit Diagnosis: Difficulty coping    Problem List Patient Active Problem List   Diagnosis Date Noted   Severe episode of recurrent major depressive disorder, without psychotic features (HCC) 07/31/2023   At risk for prolonged QT interval syndrome 07/31/2023   Back pain 05/25/2023   Hemorrhoid 05/07/2023   Laryngopharyngeal reflux 10/10/2022   Hair loss 09/22/2022   Nausea 09/22/2022   High risk medication use 08/22/2022   Acid reflux 08/20/2022   Head injury 06/08/2022   Headache 06/08/2022   Drug reaction 06/07/2022   Hypertension 12/01/2020   Generalized anxiety disorder 01/12/2020   B12 deficiency 12/23/2019   Leukocytosis 12/23/2019   Major depressive disorder,  recurrent, severe without psychotic features (HCC) 12/18/2019   MDD (major depressive disorder), recurrent, in partial remission (HCC) 12/10/2019   Restless leg syndrome 06/02/2019   Colon polyps 01/01/2018   Syncope 12/30/2017   Type 2 diabetes mellitus with hyperglycemia (HCC) 08/26/2015   Cervicalgia 05/30/2015   Healthcare maintenance 02/28/2015   Osteopenia 08/17/2014   Elevated blood pressure reading 06/14/2014   Sinus tachycardia 11/15/2012   Hypercholesterolemia 10/10/2012    Ted Mcalpine, OT 08/20/2023, 6:19 PM  Kerrin Champagne, OT   [facility] HOSPITALIZATION PROGRAM [address] [place], [place], [address] Phone: [phone]   Fax:  [phone]  Name: Angelica Robertson MRN: [id number] Date of Birth: 21-Nov-1946

## 2023-08-21 ENCOUNTER — Other Ambulatory Visit (HOSPITAL_COMMUNITY): Payer: Medicare PPO | Admitting: Licensed Clinical Social Worker

## 2023-08-21 ENCOUNTER — Other Ambulatory Visit (HOSPITAL_COMMUNITY): Payer: Medicare PPO

## 2023-08-21 DIAGNOSIS — F332 Major depressive disorder, recurrent severe without psychotic features: Secondary | ICD-10-CM

## 2023-08-21 DIAGNOSIS — M1990 Unspecified osteoarthritis, unspecified site: Secondary | ICD-10-CM | POA: Diagnosis not present

## 2023-08-21 DIAGNOSIS — Z79899 Other long term (current) drug therapy: Secondary | ICD-10-CM | POA: Diagnosis not present

## 2023-08-21 DIAGNOSIS — F411 Generalized anxiety disorder: Secondary | ICD-10-CM | POA: Diagnosis not present

## 2023-08-21 DIAGNOSIS — F3342 Major depressive disorder, recurrent, in full remission: Secondary | ICD-10-CM | POA: Diagnosis not present

## 2023-08-21 DIAGNOSIS — R4589 Other symptoms and signs involving emotional state: Secondary | ICD-10-CM

## 2023-08-21 DIAGNOSIS — E785 Hyperlipidemia, unspecified: Secondary | ICD-10-CM | POA: Diagnosis not present

## 2023-08-22 ENCOUNTER — Encounter (HOSPITAL_COMMUNITY): Payer: Self-pay

## 2023-08-22 ENCOUNTER — Other Ambulatory Visit (HOSPITAL_COMMUNITY): Payer: Medicare PPO | Admitting: Licensed Clinical Social Worker

## 2023-08-22 ENCOUNTER — Other Ambulatory Visit (HOSPITAL_COMMUNITY): Payer: Medicare PPO

## 2023-08-22 ENCOUNTER — Telehealth: Payer: Self-pay | Admitting: Internal Medicine

## 2023-08-22 DIAGNOSIS — E785 Hyperlipidemia, unspecified: Secondary | ICD-10-CM | POA: Diagnosis not present

## 2023-08-22 DIAGNOSIS — F411 Generalized anxiety disorder: Secondary | ICD-10-CM | POA: Diagnosis not present

## 2023-08-22 DIAGNOSIS — F3342 Major depressive disorder, recurrent, in full remission: Secondary | ICD-10-CM | POA: Diagnosis not present

## 2023-08-22 DIAGNOSIS — Z79899 Other long term (current) drug therapy: Secondary | ICD-10-CM | POA: Diagnosis not present

## 2023-08-22 DIAGNOSIS — F332 Major depressive disorder, recurrent severe without psychotic features: Secondary | ICD-10-CM

## 2023-08-22 DIAGNOSIS — M1990 Unspecified osteoarthritis, unspecified site: Secondary | ICD-10-CM | POA: Diagnosis not present

## 2023-08-22 NOTE — Telephone Encounter (Signed)
Pt called back and I read the note and she stated she is feeling about the same but she is ok here at home

## 2023-08-22 NOTE — Progress Notes (Signed)
Spoke with patient via Teams video call, used 2 identifiers to correctly identify patient. States that groups are still hard to sit through due to back pain. Today is her last day and she will return to her regular psychiatrist and therapy. Has a flat, depressed affect. Denies SI/HI or AV hallucinations. On scale 1-10 as 10 being worst she rates depression at 5/6 and anxiety at 0. PHQ9=17. No side effects from medications. No issues or complaints.

## 2023-08-22 NOTE — Telephone Encounter (Signed)
Called and left message - wanted to check on her and confirm doing ok.  Left message.  Can call back with update.
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
## 2023-08-23 ENCOUNTER — Other Ambulatory Visit (HOSPITAL_COMMUNITY): Payer: Medicare PPO

## 2023-08-23 ENCOUNTER — Other Ambulatory Visit: Payer: Self-pay | Admitting: Internal Medicine

## 2023-08-23 ENCOUNTER — Other Ambulatory Visit: Payer: Self-pay | Admitting: Psychiatry

## 2023-08-23 ENCOUNTER — Encounter (HOSPITAL_COMMUNITY): Payer: Self-pay

## 2023-08-23 DIAGNOSIS — F332 Major depressive disorder, recurrent severe without psychotic features: Secondary | ICD-10-CM

## 2023-08-23 NOTE — Telephone Encounter (Signed)
Please call and let her know that I was just calling to check on her.  Please confirm doing ok.  If needs earlier f/u appt let me know.

## 2023-08-23 NOTE — Telephone Encounter (Signed)
FYI for you. 

## 2023-08-23 NOTE — Therapy (Signed)
[facility] PARTIAL HOSPITALIZATION PROGRAM [address] [place], [place], [address] Phone: [phone]   Fax:  [phone]  Occupational Therapy Treatment Virtual Visit via Video Note  I connected with SOFYA LITTREL on 08/23/23 at  8:00 AM EDT by a video enabled telemedicine application and verified that I am speaking with the correct person using two identifiers.  Location: Patient: home Provider: office   I discussed the limitations of evaluation and management by telemedicine and the availability of in person appointments. The patient expressed understanding and agreed to proceed.    The patient was advised to call back or seek an in-person evaluation if the symptoms worsen or if the condition fails to improve as anticipated.  I provided 55 minutes of non-face-to-face time during this encounter.   Patient Details  Name: MARGERY HALIBURTON MRN: [id number] Date of Birth: 1946-07-08 No data recorded  Encounter Date: 08/21/2023   OT End of Session - 08/23/23 2049     Visit Number 10    Number of Visits 20    Date for OT Re-Evaluation 09/08/23    OT Start Time 1200    OT Stop Time 1255    OT Time Calculation (min) 55 min             Past Medical History:  Diagnosis Date   Depression 2000   Heart murmur    Hypercholesterolemia Osteoporosis   Hyperlipemia     Past Surgical History:  Procedure Laterality Date   ABDOMINAL HYSTERECTOMY  1980   COLONOSCOPY WITH PROPOFOL N/A 03/05/2018   Procedure: COLONOSCOPY WITH PROPOFOL;  Surgeon: Toledo, Boykin Nearing, MD;  Location: [facility] ENDOSCOPY;  Service: Endoscopy;  Laterality: N/A;   COLONOSCOPY WITH PROPOFOL N/A 04/23/2023   Procedure: COLONOSCOPY WITH PROPOFOL;  Surgeon: Regis Bill, MD;  Location: [facility] ENDOSCOPY;  Service: Endoscopy;  Laterality: N/A;    There were no vitals filed for this visit.   Subjective Assessment - 08/23/23 2048     Currently in Pain? No/denies    Pain Score 0-No pain                 Group Session:  S: Doing okay today. I feel like I have learned some new skills here.   O: The primary objective of this topic is to explore and understand the concept of occupational balance in the context of daily living. The term "occupational balance" is defined broadly, encompassing all activities that occupy an individual's time and energy, including self-care, leisure, and work-related tasks. The goal is to guide participants towards achieving a harmonious blend of these activities, tailored to their personal values and life circumstances. This balance is aimed at enhancing overall well-being, not by equally distributing time across activities, but by ensuring that daily engagements are fulfilling and not draining. The content delves into identifying various barriers that individuals face in achieving occupational balance, such as overcommitment, misaligned priorities, external pressures, and lack of effective time management. The impact of these barriers on occupational performance, roles, and lifestyles is examined, highlighting issues like reduced efficiency, strained relationships, and potential health problems. Strategies for cultivating occupational balance are a key focus. These strategies include practical methods like time blocking, prioritizing tasks, establishing self-care rituals, decluttering, connecting with nature, and engaging in reflective practices. These approaches are designed to be adaptable and applicable to a wide range of life scenarios, promoting a proactive and mindful approach to daily living. The overall aim is to equip participants with the  knowledge and tools to create a balanced lifestyle that supports their mental, emotional, and physical health, thereby improving their functional performance in daily life.   A:  The patient was present for the entire session on occupational balance but displayed a passive level of engagement. While the  patient listened attentively, there was limited verbal participation or sharing of personal experiences. The patient responded when prompted but with brief and less detailed contributions. This behavior could indicate a degree of reservation or possibly a need for more time to process the information. Despite the passive engagement, the patient did show signs of understanding the key concepts and nodded in agreement during discussions about the impacts of imbalanced occupational activities. The patient's passive engagement might also suggest a more reflective approach, potentially benefiting from additional encouragement or alternative methods of participation in future sessions.   P: Pt will DC today.   OCCUPATIONAL THERAPY DISCHARGE SUMMARY  Visits from Start of Care: 10  Current functional level related to goals / functional outcomes: Pt has asked to discharge as she is unable to tolerate the 4 hours of sitting to attend PHP group.     Plan: Patient agrees to discharge.                       OT Education - 08/23/23 2048     Education Details Occupational Performance              OT Short Term Goals - 08/09/23 0950       OT SHORT TERM GOAL #1   Title Client will develop and utilize a personalized coping toolbox containing at least five coping strategies to manage challenging situations, demonstrating their use in real-life scenarios by the end of therapy.    Time 4    Period Weeks    Status met   Target Date 09/08/23      OT SHORT TERM GOAL #2   Title Patient will independently apply psychosocial skills and coping mechanisms to her daily activities in order to function independently.    Status met     OT SHORT TERM GOAL #3   Title Patient will be educated on strategies to improve psychosocial skills needed to participate fully in all daily, work, and leisure activities.    Status met                     Plan - 08/23/23 2049      Psychosocial Skills Coping Strategies;Habits;Interpersonal Interaction;Routines and Behaviors             Patient will benefit from skilled therapeutic intervention in order to improve the following deficits and impairments:       Psychosocial Skills: Coping Strategies, Habits, Interpersonal Interaction, Routines and Behaviors   Visit Diagnosis: Difficulty coping    Problem List Patient Active Problem List   Diagnosis Date Noted   Severe episode of recurrent major depressive disorder, without psychotic features (HCC) 07/31/2023   At risk for prolonged QT interval syndrome 07/31/2023   Back pain 05/25/2023   Hemorrhoid 05/07/2023   Laryngopharyngeal reflux 10/10/2022   Hair loss 09/22/2022   Nausea 09/22/2022   High risk medication use 08/22/2022   Acid reflux 08/20/2022   Head injury 06/08/2022   Headache 06/08/2022   Drug reaction 06/07/2022   Hypertension 12/01/2020   Generalized anxiety disorder 01/12/2020   B12 deficiency 12/23/2019   Leukocytosis 12/23/2019   Major depressive disorder, recurrent, severe without psychotic features (HCC) 12/18/2019  MDD (major depressive disorder), recurrent, in partial remission (HCC) 12/10/2019   Restless leg syndrome 06/02/2019   Colon polyps 01/01/2018   Syncope 12/30/2017   Type 2 diabetes mellitus with hyperglycemia (HCC) 08/26/2015   Cervicalgia 05/30/2015   Healthcare maintenance 02/28/2015   Osteopenia 08/17/2014   Elevated blood pressure reading 06/14/2014   Sinus tachycardia 11/15/2012   Hypercholesterolemia 10/10/2012    Ted Mcalpine, OT 08/23/2023, 8:49 PM  Kerrin Champagne, OT   [facility] HOSPITALIZATION PROGRAM [address] [place], [place], [address] Phone: [phone]   Fax:  [phone]  Name: POLLYANNE IACCARINO MRN: [id number] Date of Birth: 1946-06-27

## 2023-08-23 NOTE — Telephone Encounter (Signed)
FYI   Patient is aware. She says she is ok- About the same. Does not wish to have sooner appt right now but will let me know if she needs anything

## 2023-08-24 ENCOUNTER — Other Ambulatory Visit (HOSPITAL_COMMUNITY): Payer: Medicare PPO

## 2023-08-27 ENCOUNTER — Other Ambulatory Visit (HOSPITAL_COMMUNITY): Payer: Medicare PPO

## 2023-08-28 ENCOUNTER — Other Ambulatory Visit (HOSPITAL_COMMUNITY): Payer: Medicare PPO

## 2023-08-28 ENCOUNTER — Encounter: Payer: Self-pay | Admitting: Psychiatry

## 2023-08-28 ENCOUNTER — Ambulatory Visit (INDEPENDENT_AMBULATORY_CARE_PROVIDER_SITE_OTHER): Payer: Medicare PPO | Admitting: Psychiatry

## 2023-08-28 VITALS — BP 148/75 | HR 89 | Temp 98.0°F | Ht 63.0 in | Wt 126.4 lb

## 2023-08-28 DIAGNOSIS — F332 Major depressive disorder, recurrent severe without psychotic features: Secondary | ICD-10-CM | POA: Diagnosis not present

## 2023-08-28 DIAGNOSIS — F411 Generalized anxiety disorder: Secondary | ICD-10-CM

## 2023-08-28 MED ORDER — BREXPIPRAZOLE 0.25 MG PO TABS
0.2500 mg | ORAL_TABLET | Freq: Every day | ORAL | 1 refills | Status: DC
Start: 2023-08-28 — End: 2023-09-20

## 2023-08-28 MED ORDER — ESCITALOPRAM OXALATE 20 MG PO TABS
10.0000 mg | ORAL_TABLET | Freq: Two times a day (BID) | ORAL | 1 refills | Status: AC
Start: 2023-08-28 — End: ?

## 2023-08-28 NOTE — Patient Instructions (Signed)
Brexpiprazole Tablets What is this medication? BREXPIPRAZOLE (brex PIP ray zole) treats schizophrenia. It may also be used with antidepressant medication to treat depression. It can also be used to treat agitation caused by Alzheimer disease. It works by balancing the levels of dopamine and serotonin in your brain, substances that help regulate mood, behaviors, and thoughts. It belongs to a group of medications called antipsychotics. Antipsychotic medications can be used to treat several kinds of mental health conditions. This medicine may be used for other purposes; ask your health care provider or pharmacist if you have questions. COMMON BRAND NAME(S): REXULTI What should I tell my care team before I take this medication? They need to know if you have any of these conditions: Dementia Diabetes Difficulty swallowing Have trouble controlling your muscles Have urges you are unable to control (for example, gambling, spending money, or eating) Heart disease High cholesterol History of breast cancer History of stroke Kidney disease Liver disease Low blood counts, like low white cell, platelet, or red cell counts Low blood pressure Parkinson's disease Seizures Suicidal thoughts, plans or attempt; a previous suicide attempt by you or a family member An unusual or allergic reaction to brexpiprazole, other medications, foods, dyes, or preservatives Pregnant or trying to get pregnant Breast-feeding How should I use this medication? Take this medication by mouth with water. Take it as directed on the prescription label at the same time every day. You can take it with or without food. If it upsets your stomach, take it with food. Keep taking this medication unless your care team tells you to stop. Stopping it too quickly can cause serious side effects. It can also make your condition worse. A special MedGuide will be given to you by the pharmacist with each prescription and refill. Be sure to read  this information carefully each time. Talk to your care team about the use of this medication in children. While it may be prescribed for children as young as 13 years for selected conditions, precautions do apply. Overdosage: If you think you have taken too much of this medicine contact a poison control center or emergency room at once. NOTE: This medicine is only for you. Do not share this medicine with others. What if I miss a dose? If you miss a dose, take it as soon as you can. If it is almost time for your next dose, take only that dose. Do not take double or extra doses. What may interact with this medication? Do not take this medication with any of the following: Aripiprazole Metoclopramide This medication may also interact with the following: Antihistamines for allergy, cough, and cold Certain medications for anxiety or sleep Certain medications for depression like amitriptyline, duloxetine, fluoxetine, paroxetine, sertraline Certain medications for fungal infections like fluconazole, itraconazole, ketoconazole Clarithromycin General anesthetics like halothane, isoflurane, methoxyflurane, propofol Levodopa or other medications for Parkinson's disease Medications for blood pressure Medications that relax muscles for surgery Medications for seizures Narcotic medications for pain Phenothiazines like chlorpromazine, prochlorperazine, thioridazine Quinidine Rifampin St. John's Wort This list may not describe all possible interactions. Give your health care provider a list of all the medicines, herbs, non-prescription drugs, or dietary supplements you use. Also tell them if you smoke, drink alcohol, or use illegal drugs. Some items may interact with your medicine. What should I watch for while using this medication? Visit your care team for regular checks on your progress. Tell your care team if symptoms do not start to get better or if they get worse.  Do not stop taking except on your  care team's advice. You may develop a severe reaction. Your care team will tell you how much medication to take. Patients and their families should watch out for new or worsening depression or thoughts of suicide. Also watch out for sudden changes in feelings such as feeling anxious, agitated, panicky, irritable, hostile, aggressive, impulsive, severely restless, overly excited and hyperactive, or not being able to sleep. If this happens, especially at the beginning of antidepressant treatment or after a change in dose, call your care team. You may get dizzy or drowsy. Do not drive, use machinery, or do anything that needs mental alertness until you know how this medication affects you. Do not stand or sit up quickly, especially if you are an older patient. This reduces the risk of dizzy or fainting spells. Alcohol may interfere with the effect of this medication. Avoid alcoholic drinks. There have been reports of increased sexual urges or other strong urges such as gambling while taking this medication. If you experience any of these while taking this medication, you should report this to your care team as soon as possible. This medication may cause dry eyes and blurred vision. If you wear contact lenses you may feel some discomfort. Lubricating drops may help. See your eye care team if the problem does not go away or is severe. This medication may increase blood sugar. Ask your care team if changes in diet or medications are needed if you have diabetes. This medication can cause problems with controlling your body temperature. It can lower the response of your body to cold temperatures. If possible, stay indoors during cold weather. If you must go outdoors, wear warm clothes. It can also lower the response of your body to heat. Do not overheat. Do not over-exercise. Stay out of the sun when possible. If you must be in the sun, wear cool clothing. Drink plenty of water. If you have trouble controlling your  body temperature, call your care team right away. What side effects may I notice from receiving this medication? Side effects that you should report to your care team as soon as possible: Allergic reactions--skin rash, itching, hives, swelling of the face, lips, tongue, or throat High blood sugar (hyperglycemia)--increased thirst or amount of urine, unusual weakness or fatigue, blurry vision High fever, stiff muscles, increased sweating, fast or irregular heartbeat, and confusion, which may be signs of neuroleptic malignant syndrome Infection--fever, chills, cough, sore throat Low blood pressure--dizziness, feeling faint or lightheaded, blurry vision Pain or trouble swallowing Seizures Stroke--sudden numbness or weakness of the face, arm, or leg, trouble speaking, confusion, trouble walking, loss of balance or coordination, dizziness, severe headache, change in vision Thoughts of suicide or self-harm, worsening mood, feelings of depression Uncontrolled and repetitive body movements, muscle stiffness or spasms, tremors or shaking, loss of balance or coordination, restlessness, shuffling walk, which may be signs of extrapyramidal symptoms (EPS) Urges to engage in impulsive behaviors such as gambling, binge eating, sexual activity, or shopping in ways that are unusual for you Side effects that usually do not require medical attention (report to your care team if they continue or are bothersome): Constipation Drowsiness Headache Weight gain This list may not describe all possible side effects. Call your doctor for medical advice about side effects. You may report side effects to FDA at 1-800-FDA-1088. Where should I keep my medication? Keep out of the reach of children and pets. Store at room temperature between 20 and 25 degrees C (68  and 77 degrees F). Get rid of any unused medication after the expiration date. To get rid of medications that are no longer needed or have expired: Take the  medication to a medication take-back program. Check with your pharmacy or law enforcement to find a location. If you cannot return the medication, check the label or package insert to see if the medication should be thrown out in the garbage or flushed down the toilet. If you are not sure, ask your care team. If it is safe to put it in the trash, take the medication out of the container. Mix the medication with cat litter, dirt, coffee grounds, or other unwanted substance. Seal the mixture in a bag or container. Put it in the trash. NOTE: This sheet is a summary. It may not cover all possible information. If you have questions about this medicine, talk to your doctor, pharmacist, or health care provider.  2024 Elsevier/Gold Standard (2022-05-01 00:00:00)

## 2023-08-28 NOTE — Progress Notes (Unsigned)
BH MD OP Progress Note  08/28/2023 5:17 PM JEWELL AMBRIZ  MRN:  [id number]  Chief Complaint:  Chief Complaint  Patient presents with   Follow-up   Anxiety   Depression   Medication Refill   Medication Problem   HPI: Angelica Robertson is a 77 year old Caucasian female, married, retired, lives in [place], has a history of MDD, GAD, hyperlipidemia, arthritis was evaluated in office today.  Patient presented with her husband Molly Maduro who provided collateral information.  Patient today appeared to be depressed, anxious in session.  Patient appeared to have some thought blocking, delayed response to questions asked.  Alert and oriented to person place time situation.  3 word memory immediate 3 out of 3, after 5 minutes 1 out of 3.  Patient was able to spell the word 'world' forward and backward.  Patient was unable to do serial sevens.  Husband also reports patient having some difficulty making decisions, some delay in processing information which has been ongoing since the past few weeks.  Patient was unable to tolerate partial hospitalization program.  She reports she did not fit in with the group which were filled with younger patients and she  was not very happy with the language that was used by other patients.  She also could not sit for long hours due to her back pain.  She was diagnosed with intervertebral disk problems in May and that caused her a lot of pain.  She had to wait for a month or more to get help with that.  According to her husband that may have triggered this episode of depression since that is when she started going downhill and she has not been able to come out of it yet.  Patient continues to struggle with sadness, anxiety symptoms, sleep problems.  Sleep problems are getting worse she is only getting couple of hours to 5 hours of sleep at night.  She also feels shaky and fidgety on and off throughout the day.  She wakes up with anxiety symptoms and shakiness in the morning.   According to husband she may have had it previously however since the recent dosage increase of Lexapro that may have worsened.  Patient has been taking the Lexapro 20 mg with the Abilify in the morning.  She continues to take the mirtazapine at night.  Patient denies any suicidality, homicidality or perceptual disturbances.  Patient reports she is planning to reestablish care with her therapist-Jennifer, has upcoming appointment.  Patient denies any other concerns today.  Visit Diagnosis:    ICD-10-CM   1. Major depressive disorder, recurrent, severe without psychotic features (HCC)  F33.2 brexpiprazole (REXULTI) 0.25 MG TABS tablet    2. GAD (generalized anxiety disorder)  F41.1 escitalopram (LEXAPRO) 20 MG tablet      Past Psychiatric History: I have reviewed past psychiatric history from progress note on 01/12/2020.  Patient attempted TMS-07/17/2022-could not tolerate due to side effects of headaches.Past trial of medication Duloxetine,Zoloft,wellbutrin,klonopin,trazodone, zolpidem,seroquel.  Past Medical History:  Past Medical History:  Diagnosis Date   Depression 2000   Heart murmur    Hypercholesterolemia Osteoporosis   Hyperlipemia     Past Surgical History:  Procedure Laterality Date   ABDOMINAL HYSTERECTOMY  1980   COLONOSCOPY WITH PROPOFOL N/A 03/05/2018   Procedure: COLONOSCOPY WITH PROPOFOL;  Surgeon: Toledo, Boykin Nearing, MD;  Location: [facility] END[facility]COPY;  Service: Endoscopy;  Laterality: N/A;   COLONOSCOPY WITH PROPOFOL N/A 04/23/2023   Procedure: COLONOSCOPY WITH PROPOFOL;  Surgeon: Regis Bill,  MD;  Location: [facility] ENDOSCOPY;  Service: Endoscopy;  Laterality: N/A;    Family Psychiatric History: Reviewed family psychiatric history from progress note on 01/12/2020.  Family History:  Family History  Problem Relation Age of Onset   Heart disease Mother    Diabetes Mother    Heart disease Father    Diabetes Father    Colon cancer Other    Congestive Heart Failure  Brother    Colon cancer Brother    Mental illness Paternal Aunt    BRCA 1/2 Neg Hx    Breast cancer Neg Hx    Cowden syndrome Neg Hx    DES usage Neg Hx    Endometrial cancer Neg Hx    Li-Fraumeni syndrome Neg Hx    Ovarian cancer Neg Hx     Social History: Reviewed social history from progress note on 01/12/2020. Social History   Socioeconomic History   Marital status: Married    Spouse name: robert   Number of children: 2   Years of education: Not on file   Highest education level: 12th grade  Occupational History   Not on file  Tobacco Use   Smoking status: Never   Smokeless tobacco: Never  Vaping Use   Vaping status: Never Used  Substance and Sexual Activity   Alcohol use: Yes    Alcohol/week: 0.0 standard drinks of alcohol    Comment: occasionally - socially during the summer a glass of wine or beer   Drug use: No   Sexual activity: Yes  Other Topics Concern   Not on file  Social History Narrative   Not on file   Social Determinants of Health   Financial Resource Strain: Low Risk  (04/29/2023)   Overall Financial Resource Strain (CARDIA)    Difficulty of Paying Living Expenses: Not hard at all  Food Insecurity: No Food Insecurity (04/29/2023)   Hunger Vital Sign    Worried About Running Out of Food in the Last Year: Never true    Ran Out of Food in the Last Year: Never true  Transportation Needs: No Transportation Needs (04/29/2023)   PRAPARE - Administrator, Civil Service (Medical): No    Lack of Transportation (Non-Medical): No  Physical Activity: Insufficiently Active (04/29/2023)   Exercise Vital Sign    Days of Exercise per Week: 3 days    Minutes of Exercise per Session: 30 min  Stress: No Stress Concern Present (04/29/2023)   [employer] of Occupational Health - Occupational Stress Questionnaire    Feeling of Stress : Not at all  Social Connections: Socially Integrated (04/29/2023)   Social Connection and Isolation Panel [NHANES]     Frequency of Communication with Friends and Family: Twice a week    Frequency of Social Gatherings with Friends and Family: Twice a week    Attends Religious Services: More than 4 times per year    Active Member of Clubs or Organizations: Yes    Attends Engineer, structural: More than 4 times per year    Marital Status: Married    Allergies: No Known Allergies  Metabolic Disorder Labs: Lab Results  Component Value Date   HGBA1C 6.0 (H) 08/10/2023   No results found for: "PROLACTIN" Lab Results  Component Value Date   CHOL 118 08/10/2023   TRIG 60 08/10/2023   HDL 67 08/10/2023   CHOLHDL 1.8 08/10/2023   VLDL 03.5 04/25/2023   LDLCALC 38 08/10/2023   LDLCALC 42 04/25/2023   Lab Results  Component Value Date   TSH 1.860 08/10/2023   TSH 1.20 09/22/2022    Therapeutic Level Labs: No results found for: "LITHIUM" No results found for: "VALPROATE" No results found for: "CBMZ"  Current Medications: Current Outpatient Medications  Medication Sig Dispense Refill   amLODipine (NORVASC) 2.5 MG tablet Take 1 tablet (2.5 mg total) by mouth daily. In am 90 tablet 3   brexpiprazole (REXULTI) 0.25 MG TABS tablet Take 1 tablet (0.25 mg total) by mouth daily. Stop Abilify 30 tablet 1   calcium carbonate (OS-CAL) 600 MG TABS tablet Take 600 mg by mouth 2 (two) times daily with a meal.     losartan (COZAAR) 25 MG tablet TAKE 1 TABLET BY MOUTH ONCE DAILY 90 tablet 1   mirtazapine (REMERON) 15 MG tablet Take 1.5 tablets (22.5 mg total) by mouth at bedtime. 135 tablet 1   pantoprazole (PROTONIX) 20 MG tablet Take 1 tablet (20 mg total) by mouth daily. 90 tablet 1   rosuvastatin (CRESTOR) 20 MG tablet Take 1 tablet (20 mg total) by mouth at bedtime. 90 tablet 3   VITAMIN D PO Take by mouth daily.     escitalopram (LEXAPRO) 20 MG tablet Take 0.5 tablets (10 mg total) by mouth 2 (two) times daily. 30 tablet 1   No current facility-administered medications for this visit.      Musculoskeletal: Strength & Muscle Tone: within normal limits Gait & Station:  Slow Patient leans: N/A  Psychiatric Specialty Exam: Review of Systems  Psychiatric/Behavioral:  Positive for decreased concentration, dysphoric mood and sleep disturbance. The patient is nervous/anxious.     Blood pressure (!) 148/75, pulse 89, temperature 98 F (36.7 C), temperature source Skin, height 5\' 3"  (1.6 m), weight 126 lb 6.4 oz (57.3 kg), last menstrual period 11/27/1979.Body mass index is 22.39 kg/m.  General Appearance: Casual  Eye Contact:  Fair  Speech:  Clear and Coherent  Volume:  Normal  Mood:  Anxious and Depressed  Affect:  Depressed  Thought Process:  Goal Directed and Descriptions of Associations: Intact  Orientation:  Full (Time, Place, and Person)  Thought Content: Logical   Suicidal Thoughts:  No  Homicidal Thoughts:  No  Memory:  Immediate;   Fair Recent;   Poor Remote;   Poor  Judgement:  Fair  Insight:  Fair  Psychomotor Activity:  Psychomotor Retardation  Concentration:  Concentration: Poor and Attention Span: Poor  Recall:  Poor  Fund of Knowledge: Fair  Language: Fair  Akathisia:  No  Handed:  Right  AIMS (if indicated): done  Assets:  Communication Skills Desire for Improvement Housing Intimacy Social Support  ADL's:  Intact  Cognition: WNL  Sleep:  Poor   Screenings: AIMS    Flowsheet Row Office Visit from 08/28/2023 in [facility] Office Visit from 06/20/2022 in [facility] Office Visit from 04/24/2022 in [facility]  AIMS Total Score 0 0 0      AUDIT    Flowsheet Row Admission (Discharged) from 05/12/2022 in [facility] Admission (Discharged) from 12/17/2019 in [facility]PATIENT BE[facility]ICINE Admission (Discharged) from [facility]  Alcohol  Use Disorder Identification Test Final Score (AUDIT) 0 0 0      GAD-7    Flowsheet Row Office Visit from 08/28/2023 in [facility] Office Visit from 07/31/2023 in [facility] Office  Visit from 11/15/2022 in [facility] Office Visit from 09/20/2022 in [facility] Office Visit from 08/22/2022 in [facility]  Total GAD-7 Score 16 15 1 6 20       Mini-Mental    Flowsheet Row Clinical Support from 02/27/2018 in [facility] Clinical Support from 02/02/2017 in [facility] Clinical Support from 02/03/2016 in [facility]  Total Score (max 30 points ) 30 30 30       PHQ2-9    Flowsheet Row Office Visit from 08/28/2023 in [facility] Counselor from 08/22/2023 in [facility] PROGRAM Office Visit from 08/14/2023 in [facility] [employer] from 08/01/2023 in BEHAVIORAL HEALTH PARTIAL HOSPITALIZATION PROGRAM Office Visit from 07/31/2023 in [facility]  PHQ-2 Total Score 6 4 6 6 6   PHQ-9 Total Score 22 17 22 25 25       Flowsheet Row Office Visit from 08/28/2023 in [facility] Counselor from 08/01/2023 in BEHAVIORAL HEALTH PARTIAL HOSPITALIZATION PROGRAM Office Visit from 07/31/2023 in [facility]  C-SSRS RISK CATEGORY No Risk Error: Q3, 4, or 5 should not be populated when Q2 is No Low Risk        Assessment and Plan: ISRAA STOCKMANN is a 77 year old Caucasian female, married, retired, lives in [place], has a history of MDD, anxiety, hyperlipidemia was evaluated in  office today.  Patient did not tolerate partial hospitalization program to which she was referred on 07/31/2023, continues to struggle with worsening mood symptoms, will benefit from the following plan.  Plan MDD-unstable Continue mirtazapine 22.5 mg p.o. nightly Change Lexapro to 10 mg p.o. twice daily.  Patient is currently taking 20 mg in the morning which is causing her side effects. Will discontinue Abilify. Start Rexulti 0.25 mg p.o. daily. Provided medication education discussed drug-drug interaction including serotonin syndrome risk. Will refer patient for ketamine nasal spray-consultation, I have sent a referral to [facility].  GAD-unstable Lexapro recently readjusted by partial hospitalization program-currently on 20 mg daily advised patient to take it in divided dosage due to side effects. Mirtazapine 22.5 mg p.o. nightly Patient advised to reestablish care with therapist-has upcoming appointment with therapist Ms. Victorino Dike.   EKG reviewed dated August 06, 2023-normal sinus rhythm, QTc 437.  I have obtained collateral information from spouse as well as partial hospitalization provider.   Collaboration of Care: Collaboration of Care: Referral or follow-up with counselor/therapist AEB patient encouraged to follow up with therapist.  Patient/Guardian was advised Release of Information must be obtained prior to any record release in order to collaborate their care with an outside provider. Patient/Guardian was advised if they have not already done so to contact the registration department to sign all necessary forms in order for [place] to release information regarding their care.   Consent: Patient/Guardian gives verbal consent for treatment and assignment of benefits for services provided during this visit. Patient/Guardian expressed understanding and agreed to proceed.  Follow-up in clinic in 2 weeks or sooner if needed.  I have spent atleast 40 minutes face to face with patient  today which includes the time spent for preparing to see the patient ( e.g., review of test, records ), obtaining and to review and separately obtained history , ordering medications and test ,psychoeducation and supportive psychotherapy  and care coordination,as well as documenting clinical information in electronic health record,interpreting and communication of test results   This note was generated in part or whole with voice recognition software. Voice recognition is usually quite accurate but there are transcription errors that can and very often do occur. I apologize for any typographical errors that were not detected and corrected.     Jomarie Longs, MD 08/29/2023, 12:50 PM

## 2023-08-29 ENCOUNTER — Telehealth: Payer: Self-pay

## 2023-08-29 NOTE — Telephone Encounter (Signed)
received fax from [facility] that a prior auth was needed for the rexulti

## 2023-08-29 NOTE — Telephone Encounter (Signed)
went online to [email or web address] and submitted the prior auth . - pending 

## 2023-08-29 NOTE — Telephone Encounter (Signed)
prior auth for the rexulti was approved until 12-11-23

## 2023-08-30 ENCOUNTER — Other Ambulatory Visit: Payer: Medicare PPO

## 2023-08-30 ENCOUNTER — Ambulatory Visit: Payer: Medicare PPO

## 2023-08-31 ENCOUNTER — Ambulatory Visit (INDEPENDENT_AMBULATORY_CARE_PROVIDER_SITE_OTHER): Payer: Medicare PPO

## 2023-08-31 DIAGNOSIS — E538 Deficiency of other specified B group vitamins: Secondary | ICD-10-CM

## 2023-08-31 DIAGNOSIS — E871 Hypo-osmolality and hyponatremia: Secondary | ICD-10-CM

## 2023-08-31 MED ORDER — CYANOCOBALAMIN 1000 MCG/ML IJ SOLN
1000.0000 ug | Freq: Once | INTRAMUSCULAR | Status: AC
Start: 2023-08-31 — End: 2023-08-31
  Administered 2023-08-31: 1000 ug via INTRAMUSCULAR

## 2023-08-31 NOTE — Addendum Note (Signed)
Addended by: Jarvis Morgan D on: 08/31/2023 02:46 PM   Modules accepted: Orders

## 2023-08-31 NOTE — Progress Notes (Signed)
After obtaining consent, injection of B12 given by Kristie Cowman in left deltoid. Patient tolerated injection well.

## 2023-09-01 LAB — SODIUM: Sodium: 135 mmol/L (ref 135–146)

## 2023-09-04 ENCOUNTER — Encounter: Payer: Self-pay | Admitting: Internal Medicine

## 2023-09-04 ENCOUNTER — Ambulatory Visit: Payer: Medicare PPO | Admitting: Internal Medicine

## 2023-09-04 VITALS — BP 138/78 | HR 63 | Temp 97.8°F | Ht 63.0 in | Wt 125.6 lb

## 2023-09-04 DIAGNOSIS — Z23 Encounter for immunization: Secondary | ICD-10-CM

## 2023-09-04 DIAGNOSIS — R42 Dizziness and giddiness: Secondary | ICD-10-CM | POA: Diagnosis not present

## 2023-09-04 DIAGNOSIS — Z0000 Encounter for general adult medical examination without abnormal findings: Secondary | ICD-10-CM | POA: Diagnosis not present

## 2023-09-04 DIAGNOSIS — E1165 Type 2 diabetes mellitus with hyperglycemia: Secondary | ICD-10-CM | POA: Diagnosis not present

## 2023-09-04 DIAGNOSIS — F332 Major depressive disorder, recurrent severe without psychotic features: Secondary | ICD-10-CM | POA: Diagnosis not present

## 2023-09-04 DIAGNOSIS — I10 Essential (primary) hypertension: Secondary | ICD-10-CM | POA: Diagnosis not present

## 2023-09-04 DIAGNOSIS — K219 Gastro-esophageal reflux disease without esophagitis: Secondary | ICD-10-CM | POA: Diagnosis not present

## 2023-09-04 DIAGNOSIS — E7800 Pure hypercholesterolemia, unspecified: Secondary | ICD-10-CM | POA: Diagnosis not present

## 2023-09-04 NOTE — Assessment & Plan Note (Addendum)
Physical today 09/04/23.  Mammogram 06/26/23- Birads 0. Diagnostic mammogram left breast 07/02/23 - Birads I.  Recommended continuing annual screening mammogram.Colonoscopy 03/05/18.

## 2023-09-06 DIAGNOSIS — F4323 Adjustment disorder with mixed anxiety and depressed mood: Secondary | ICD-10-CM | POA: Diagnosis not present

## 2023-09-06 DIAGNOSIS — F3342 Major depressive disorder, recurrent, in full remission: Secondary | ICD-10-CM | POA: Diagnosis not present

## 2023-09-07 ENCOUNTER — Encounter: Payer: Self-pay | Admitting: Internal Medicine

## 2023-09-07 DIAGNOSIS — L814 Other melanin hyperpigmentation: Secondary | ICD-10-CM | POA: Diagnosis not present

## 2023-09-07 DIAGNOSIS — D2262 Melanocytic nevi of left upper limb, including shoulder: Secondary | ICD-10-CM | POA: Diagnosis not present

## 2023-09-07 DIAGNOSIS — D2261 Melanocytic nevi of right upper limb, including shoulder: Secondary | ICD-10-CM | POA: Diagnosis not present

## 2023-09-07 DIAGNOSIS — D2271 Melanocytic nevi of right lower limb, including hip: Secondary | ICD-10-CM | POA: Diagnosis not present

## 2023-09-07 DIAGNOSIS — D225 Melanocytic nevi of trunk: Secondary | ICD-10-CM | POA: Diagnosis not present

## 2023-09-07 DIAGNOSIS — D2272 Melanocytic nevi of left lower limb, including hip: Secondary | ICD-10-CM | POA: Diagnosis not present

## 2023-09-07 DIAGNOSIS — L821 Other seborrheic keratosis: Secondary | ICD-10-CM | POA: Diagnosis not present

## 2023-09-07 DIAGNOSIS — L565 Disseminated superficial actinic porokeratosis (DSAP): Secondary | ICD-10-CM | POA: Diagnosis not present

## 2023-09-07 MED ORDER — ROSUVASTATIN CALCIUM 20 MG PO TABS: 20.0000 mg | ORAL_TABLET | Freq: Every day | ORAL | 3 refills | Status: DC

## 2023-09-07 MED ORDER — AMLODIPINE BESYLATE 2.5 MG PO TABS
2.5000 mg | ORAL_TABLET | Freq: Every day | ORAL | 3 refills | Status: DC
Start: 2023-09-07 — End: 2024-01-06

## 2023-09-07 MED ORDER — PANTOPRAZOLE SODIUM 20 MG PO TBEC: 20.0000 mg | DELAYED_RELEASE_TABLET | Freq: Every day | ORAL | 1 refills | Status: DC

## 2023-09-07 NOTE — Assessment & Plan Note (Signed)
Followed by psych. Last saw Dr Elna Breslow 08/28/23 - recommended continuing mirtazapine.  Changed lexapro to 10mg  bid. Abilify stopped.  Started rexulti. Referred for ketamine nasal spry consultation. She reports she may be feeling some better.  No SI.  Seeing a therapist.

## 2023-09-07 NOTE — Assessment & Plan Note (Signed)
Doing well on protonix 20mg  q day.  Continue.  Follow.

## 2023-09-07 NOTE — Assessment & Plan Note (Signed)
Discussed.  Follow met b and A1c.  Lab Results  Component Value Date   HGBA1C 6.0 (H) 08/10/2023

## 2023-09-07 NOTE — Assessment & Plan Note (Signed)
Continue losartan and amlodipine. Pressures as outlined.  No change in medication.  Follow pressures.  Follow metabolic panel.  

## 2023-09-07 NOTE — Assessment & Plan Note (Signed)
On crestor.  Follow lipid panel and liver function tests.   

## 2023-09-08 NOTE — Psych (Signed)
Virtual Visit via Video Note  I connected with Angelica Robertson on 08/14/23 at  9:00 AM EDT by a video enabled telemedicine application and verified that I am speaking with the correct person using two identifiers.  Location: Patient: patient home Provider: clinical home office   I discussed the limitations of evaluation and management by telemedicine and the availability of in person appointments. The patient expressed understanding and agreed to proceed.  I discussed the assessment and treatment plan with the patient. The patient was provided an opportunity to ask questions and all were answered. The patient agreed with the plan and demonstrated an understanding of the instructions.   The patient was advised to call back or seek an in-person evaluation if the symptoms worsen or if the condition fails to improve as anticipated.  Pt was provided 240 minutes of non-face-to-face time during this encounter.   Donia Guiles, LCSW   [facility] BH PHP THERAPIST PROGRESS NOTE  Angelica Robertson [id number]  Session Time: 9:00 - 10:00  Participation Level: Active  Behavioral Response: CasualAlertAnxious and Depressed  Type of Therapy: Group Therapy  Treatment Goals addressed: Coping  Progress Towards Goals: Progressing  Interventions: CBT, DBT, Supportive, and Reframing  Summary: Angelica Robertson is a 77 y.o. female who presents with depression and anxiety symptoms.  Clinician led check-in regarding current stressors and situation, and review of patient completed daily inventory. Clinician utilized active listening and empathetic response and validated patient emotions. Clinician facilitated processing group on pertinent issues.?    Therapist Response:  Patient arrived within time allowed. Patient rates her mood at a 2.5 on a scale of 1-10 with 10 being best. Pt states she feels "anxious." Pt states she slept 6 hours and ate 3x. Pt reports she has been awake since 4am and unable to go back to sleep. Pt  states she has no appetite and is eating because her husband is making her. Pt shares her granddaughter made a surprise visit to see her and pt felt "guilty" and "horrible" because she could not "hide my depression" or be upbeat. Pt reports struggling with hopelessness and worry about the future due to "not getting better." Pt states increased anxiety re: her annual physical appointment this afternoon. Patient able to process. Patient engaged in discussion.            Session Time: 10:00 am - 11:00 am   Participation Level: Active   Behavioral Response: CasualAlertDepressed   Type of Therapy: Group Therapy   Treatment Goals addressed: Coping   Progress Towards Goals: Progressing   Interventions: CBT, DBT, Solution Focused, Strength-based, Supportive, and Reframing   Therapist Response: Cln led processing group for pt's current struggles. Group members shared stressors and provided support and feedback. Cln brought in topics of boundaries, healthy relationships, and unhealthy thought processes to inform discussion.    Therapist Response: Pt able to process and provide support to group.          Session Time: 11:00 -12:00   Participation Level: Active   Behavioral Response: CasualAlertDepressed   Type of Therapy: Group Therapy   Treatment Goals addressed: Coping   Progress Towards Goals: Progressing   Interventions: CBT, DBT, Solution Focused, Strength-based, Supportive, and Reframing   Summary: Cln led discussion on unrealistic expectations and the ways expectations can alter perspective. Group members discussed feelings and situations that have occurred due to expectation and how to know if they are unrealistic. Cln brought in CBT thought challenging and reframing to aid discussion.  Therapist Response:  Pt engaged in discussion and reports gaining insight.        Session Time: 12:00 -1:00   Participation Level: Active   Behavioral Response: CasualAlertDepressed    Type of Therapy: Group therapy   Treatment Goals addressed: Coping   Progress Towards Goals: Progressing   Interventions: OT group   Summary: 12:00 - 12:50: Occupational Therapy group with cln E. Hollan.  12:50 - 1:00 Clinician assessed for immediate needs, medication compliance and efficacy, and safety concerns.   Therapist Response: 12:00 - 12:50: See note 12:50 - 1:00 pm: At check-out, patient reports no immediate concerns. Patient demonstrates progress as evidenced by continued engagement and responsiveness to treatment. Patient denies SI/HI/self-harm thoughts at the end of group.  Suicidal/Homicidal: Nowithout intent/plan  Plan: Pt will continue in PHP while working to decrease depression and anxiety symptoms, increase daily functioning, and increase ability to manage symptoms in a healthy manner.   Collaboration of Care: Medication Management AEB J Cyndie Chime  Patient/Guardian was advised Release of Information must be obtained prior to any record release in order to collaborate their care with an outside provider. Patient/Guardian was advised if they have not already done so to contact the registration department to sign all necessary forms in order for [place] to release information regarding their care.   Consent: Patient/Guardian gives verbal consent for treatment and assignment of benefits for services provided during this visit. Patient/Guardian expressed understanding and agreed to proceed.   Diagnosis: Severe episode of recurrent major depressive disorder, without psychotic features (HCC) [F33.2]    1. Severe episode of recurrent major depressive disorder, without psychotic features (HCC)   2. GAD (generalized anxiety disorder)      Donia Guiles, LCSW

## 2023-09-08 NOTE — Psych (Signed)
Virtual Visit via Video Note  I connected with Angelica Robertson on 08/16/23 at  9:00 AM EDT by a video enabled telemedicine application and verified that I am speaking with the correct person using two identifiers.  Location: Patient: patient home Provider: clinical home office   I discussed the limitations of evaluation and management by telemedicine and the availability of in person appointments. The patient expressed understanding and agreed to proceed.  I discussed the assessment and treatment plan with the patient. The patient was provided an opportunity to ask questions and all were answered. The patient agreed with the plan and demonstrated an understanding of the instructions.   The patient was advised to call back or seek an in-person evaluation if the symptoms worsen or if the condition fails to improve as anticipated.  Pt was provided 180 minutes of non-face-to-face time during this encounter.   Donia Guiles, LCSW   [facility] BH PHP THERAPIST PROGRESS NOTE  Angelica Robertson [id number]  Session Time: 9:00 - 10:00  Participation Level: Active  Behavioral Response: CasualAlertAnxious and Depressed  Type of Therapy: Group Therapy  Treatment Goals addressed: Coping  Progress Towards Goals: Progressing  Interventions: CBT, DBT, Supportive, and Reframing  Summary: Angelica Robertson is a 77 y.o. female who presents with depression and anxiety symptoms.  Clinician led check-in regarding current stressors and situation, and review of patient completed daily inventory. Clinician utilized active listening and empathetic response and validated patient emotions. Clinician facilitated processing group on pertinent issues.?    Therapist Response:  Patient arrived within time allowed. Patient rates her mood at a 5 on a scale of 1-10 with 10 being best. Pt states she feels "fair." Pt states she slept 5 hours and ate 3x small portions. Pt reports she increased a medication and she is experiencing  heightened anxiety re: possible side effects. Pt reports difficulty sitting in group due to neck and back pain. Patient able to process. Patient engaged in discussion.            Session Time: 10:00 am - 11:00 am   Participation Level: Active   Behavioral Response: CasualAlertDepressed   Type of Therapy: Group Therapy   Treatment Goals addressed: Coping   Progress Towards Goals: Progressing   Interventions: CBT, DBT, Solution Focused, Strength-based, Supportive, and Reframing   Therapist Response: Cln led discussion on planning ahead as a way to mitigate anxiety. Group members shared worries about keeping up good habits when returning to "normal" life post-treatment. Group able to brainstorm ways to build habits now and how they can fit into their life after treatment.    Therapist Response: Pt engaged in discussion and identified ways to plan ahead for anxieties.          Session Time: 11:00 -12:00   Participation Level: Active   Behavioral Response: CasualAlertDepressed   Type of Therapy: Group Therapy   Treatment Goals addressed: Coping   Progress Towards Goals: Progressing   Interventions: CBT, DBT, Solution Focused, Strength-based, Supportive, and Reframing   Summary: Cln introduced the "Thoughts" distraction skills from DBT distress tolerance skill ACCEPTS. Cln discussed how this set of distraction skills can be helpful when in situations where outside resources are limited or unavailable. Group practiced and brainstormed ways to apply the Thought skill.   Therapist Response:  Pt engaged in discussion and identified ways to apply skill.   **Pt chose to leave group at 12p due to pain issues. Pt denies SI/HI before leaving group.  Suicidal/Homicidal: Nowithout intent/plan  Plan: Pt will continue in PHP while working to decrease depression and anxiety symptoms, increase daily functioning, and increase ability to manage symptoms in a healthy manner.    Collaboration of Care: Medication Management AEB J Cyndie Chime  Patient/Guardian was advised Release of Information must be obtained prior to any record release in order to collaborate their care with an outside provider. Patient/Guardian was advised if they have not already done so to contact the registration department to sign all necessary forms in order for [place] to release information regarding their care.   Consent: Patient/Guardian gives verbal consent for treatment and assignment of benefits for services provided during this visit. Patient/Guardian expressed understanding and agreed to proceed.   Diagnosis: Severe episode of recurrent major depressive disorder, without psychotic features (HCC) [F33.2]    1. Severe episode of recurrent major depressive disorder, without psychotic features (HCC)   2. GAD (generalized anxiety disorder)      Donia Guiles, LCSW

## 2023-09-08 NOTE — Psych (Signed)
Virtual Visit via Video Note  I connected with Angelica Robertson on 08/15/23 at  9:00 AM EDT by a video enabled telemedicine application and verified that I am speaking with the correct person using two identifiers.  Location: Patient: patient home Provider: clinical home office   I discussed the limitations of evaluation and management by telemedicine and the availability of in person appointments. The patient expressed understanding and agreed to proceed.  I discussed the assessment and treatment plan with the patient. The patient was provided an opportunity to ask questions and all were answered. The patient agreed with the plan and demonstrated an understanding of the instructions.   The patient was advised to call back or seek an in-person evaluation if the symptoms worsen or if the condition fails to improve as anticipated.  Pt was provided 240 minutes of non-face-to-face time during this encounter.   Donia Guiles, LCSW   [facility] BH PHP THERAPIST PROGRESS NOTE  Angelica Robertson [id number]  Session Time: 9:00 - 10:00  Participation Level: Active  Behavioral Response: CasualAlertAnxious and Depressed  Type of Therapy: Group Therapy  Treatment Goals addressed: Coping  Progress Towards Goals: Progressing  Interventions: CBT, DBT, Supportive, and Reframing  Summary: Angelica Robertson is a 77 y.o. female who presents with depression and anxiety symptoms.  Clinician led check-in regarding current stressors and situation, and review of patient completed daily inventory. Clinician utilized active listening and empathetic response and validated patient emotions. Clinician facilitated processing group on pertinent issues.?    Therapist Response:  Patient arrived within time allowed. Patient rates her mood at a 4 on a scale of 1-10 with 10 being best. Pt states she feels "better than yesterday." Pt states she slept 5 hours and ate 3x small portions. Pt reports her annual physical went better than  expected and pt's concerns did not come to pass. Pt reports continued hopelessness and passive SI however reports decrease. Patient able to process. Patient engaged in discussion.            Session Time: 10:00 am - 11:00 am   Participation Level: Active   Behavioral Response: CasualAlertDepressed   Type of Therapy: Group Therapy   Treatment Goals addressed: Coping   Progress Towards Goals: Progressing   Interventions: CBT, DBT, Solution Focused, Strength-based, Supportive, and Reframing   Therapist Response: Cln facilitated processing group around difficult relationships. Group members shared struggles they face in relationships. Cln brought in topics of self-esteem, CBT thought challenging, boundaries, and communication to aid growth.   Therapist Response: Pt engaged in discussion and is able to process.          Session Time: 11:00 -12:00   Participation Level: Active   Behavioral Response: CasualAlertDepressed   Type of Therapy: Group Therapy   Treatment Goals addressed: Coping   Progress Towards Goals: Progressing   Interventions: CBT, DBT, Solution Focused, Strength-based, Supportive, and Reframing   Summary: Cln led discussion on "why" and "what now" in terms of how we focus on our problems. Group members shared how focus on "why" has impacted them and barriers to focusing on "what now." Group able to process. Cln encouraged pt's to consider what "why" accomplishes.    Therapist Response:  Pt engaged in discussion and is able to gain insight.       Session Time: 12:00 -1:00   Participation Level: Active   Behavioral Response: CasualAlertDepressed   Type of Therapy: Group therapy   Treatment Goals addressed: Coping   Progress Towards Goals:  Progressing   Interventions: OT group   Summary: 12:00 - 12:50: Occupational Therapy group with cln E. Hollan.  12:50 - 1:00 Clinician assessed for immediate needs, medication compliance and efficacy, and safety  concerns.   Therapist Response: 12:00 - 12:50: See note 12:50 - 1:00 pm: At check-out, patient reports no immediate concerns. Patient demonstrates progress as evidenced by continued engagement and responsiveness to treatment. Patient denies SI/HI/self-harm thoughts at the end of group.  Suicidal/Homicidal: Nowithout intent/plan  Plan: Pt will continue in PHP while working to decrease depression and anxiety symptoms, increase daily functioning, and increase ability to manage symptoms in a healthy manner.   Collaboration of Care: Medication Management AEB J Cyndie Chime  Patient/Guardian was advised Release of Information must be obtained prior to any record release in order to collaborate their care with an outside provider. Patient/Guardian was advised if they have not already done so to contact the registration department to sign all necessary forms in order for [place] to release information regarding their care.   Consent: Patient/Guardian gives verbal consent for treatment and assignment of benefits for services provided during this visit. Patient/Guardian expressed understanding and agreed to proceed.   Diagnosis: Severe episode of recurrent major depressive disorder, without psychotic features (HCC) [F33.2]    1. Severe episode of recurrent major depressive disorder, without psychotic features (HCC)   2. GAD (generalized anxiety disorder)      Donia Guiles, LCSW

## 2023-09-09 NOTE — Psych (Signed)
Virtual Visit via Video Note  I connected with Angelica GRONEWOLD on 08/17/23 at  9:00 AM EDT by a video enabled telemedicine application and verified that I am speaking with the correct person using two identifiers.  Location: Patient: patient home Provider: clinical home office   I discussed the limitations of evaluation and management by telemedicine and the availability of in person appointments. The patient expressed understanding and agreed to proceed.  I discussed the assessment and treatment plan with the patient. The patient was provided an opportunity to ask questions and all were answered. The patient agreed with the plan and demonstrated an understanding of the instructions.   The patient was advised to call back or seek an in-person evaluation if the symptoms worsen or if the condition fails to improve as anticipated.  Pt was provided 240 minutes of non-face-to-face time during this encounter.   Angelica Guiles, LCSW   [facility] BH PHP THERAPIST PROGRESS NOTE  Angelica Robertson [id number]  Session Time: 9:00 - 10:00  Participation Level: Active  Behavioral Response: CasualAlertAnxious and Depressed  Type of Therapy: Group Therapy  Treatment Goals addressed: Coping  Progress Towards Goals: Progressing  Interventions: CBT, DBT, Supportive, and Reframing  Summary: Angelica Robertson is a 77 y.o. female who presents with depression and anxiety symptoms.  Clinician led check-in regarding current stressors and situation, and review of patient completed daily inventory. Clinician utilized active listening and empathetic response and validated patient emotions. Clinician facilitated processing group on pertinent issues.?    Therapist Response:  Patient arrived within time allowed. Patient rates her mood at a 5 on a scale of 1-10 with 10 being best. Pt states she feels "fine." Pt states she slept 6 hours and ate 3x small portions. Pt shares frustration because she is indecisive when depressed  and feels she cannot make a decision. Pt reports watching tennis yesterday and it holding her attention.  Pt reports continued hopelessness and passive SI however reports continued lower level than previous. Patient able to process. Patient engaged in discussion.            Session Time: 10:00 am - 11:00 am   Participation Level: Active   Behavioral Response: CasualAlertDepressed   Type of Therapy: Group Therapy   Treatment Goals addressed: Coping   Progress Towards Goals: Progressing   Interventions: CBT, DBT, Solution Focused, Strength-based, Supportive, and Reframing   Therapist Response: Cln led discussion on ways to manage stressors and feelings over the weekend. Group members  brainstormed things to do over the weekend for multiple levels of energy, access, and moods. Cln reviewed crisis services should they be needed and provided pt's with the text crisis line, mobile crisis, national suicide hotline, [facility] 24/7 line, and information on [facility].      Therapist Response:  Pt engaged in discussion and is able to identify 3 ideas of what to do over the weekend to keep their mind engaged.          Session Time: 11:00 -12:00   Participation Level: Active   Behavioral Response: CasualAlertDepressed   Type of Therapy: Group Therapy   Treatment Goals addressed: Coping   Progress Towards Goals: Progressing   Interventions: Strength-based, Supportive, and Reframing   Summary: Chaplaincy group with K. Claussen   Therapist Response: Pt participated and engaged in discussion.        Session Time: 12:00 -1:00   Participation Level: Active   Behavioral Response: CasualAlertDepressed   Type of Therapy: Group therapy  Treatment Goals addressed: Coping   Progress Towards Goals: Progressing   Interventions: OT group   Summary: 12:00 - 12:50: Occupational Therapy group with cln E. Hollan.  12:50 - 1:00 Clinician assessed for immediate needs, medication  compliance and efficacy, and safety concerns.   Therapist Response: 12:00 - 12:50: See note 12:50 - 1:00 pm: At check-out, patient reports no immediate concerns. Patient demonstrates progress as evidenced by continued engagement and responsiveness to treatment. Patient denies SI/HI/self-harm thoughts at the end of group.  Suicidal/Homicidal: Nowithout intent/plan  Plan: Pt will continue in PHP while working to decrease depression and anxiety symptoms, increase daily functioning, and increase ability to manage symptoms in a healthy manner.   Collaboration of Care: Medication Management AEB J Cyndie Chime  Patient/Guardian was advised Release of Information must be obtained prior to any record release in order to collaborate their care with an outside provider. Patient/Guardian was advised if they have not already done so to contact the registration department to sign all necessary forms in order for [place] to release information regarding their care.   Consent: Patient/Guardian gives verbal consent for treatment and assignment of benefits for services provided during this visit. Patient/Guardian expressed understanding and agreed to proceed.   Diagnosis: Severe episode of recurrent major depressive disorder, without psychotic features (HCC) [F33.2]    1. Severe episode of recurrent major depressive disorder, without psychotic features (HCC)   2. GAD (generalized anxiety disorder)      Angelica Guiles, LCSW

## 2023-09-09 NOTE — Psych (Signed)
Virtual Visit via Video Note  I connected with Angelica Robertson on 08/21/23 at  9:00 AM EDT by a video enabled telemedicine application and verified that I am speaking with the correct person using two identifiers.  Location: Patient: patient home Provider: clinical home office   I discussed the limitations of evaluation and management by telemedicine and the availability of in person appointments. The patient expressed understanding and agreed to proceed.  I discussed the assessment and treatment plan with the patient. The patient was provided an opportunity to ask questions and all were answered. The patient agreed with the plan and demonstrated an understanding of the instructions.   The patient was advised to call back or seek an in-person evaluation if the symptoms worsen or if the condition fails to improve as anticipated.  Pt was provided 240 minutes of non-face-to-face time during this encounter.   Angelica Guiles, LCSW   [facility] PHP THERAPIST PROGRESS NOTE  Angelica Robertson [id number]  Session Time: 9:00 - 10:00  Participation Level: Active  Behavioral Response: CasualAlertAnxious and Depressed  Type of Therapy: Group Therapy  Treatment Goals addressed: Coping  Progress Towards Goals: Progressing  Interventions: CBT, DBT, Supportive, and Reframing  Summary: Angelica Robertson is a 77 y.o. female who presents with depression and anxiety symptoms.  Clinician led check-in regarding current stressors and situation, and review of patient completed daily inventory. Clinician utilized active listening and empathetic response and validated patient emotions. Clinician facilitated processing group on pertinent issues.?    Therapist Response:  Patient arrived within time allowed. Patient rates her mood at a 5 on a scale of 1-10 with 10 being best. Pt states she feels "fair." Pt states she slept 6 hours and ate 3x small portions. Pt reports she has the beginning of a headache and is tired of  them. Pt states she took advantage of the cooler weather and sat on the porch for a while last night which was pleasurable. Pt reports continued hopelessness and passive SI. Patient able to process. Patient engaged in discussion.            Session Time: 10:00 am - 11:00 am   Participation Level: Active   Behavioral Response: CasualAlertDepressed   Type of Therapy: Group Therapy   Treatment Goals addressed: Coping   Progress Towards Goals: Progressing   Interventions: CBT, DBT, Solution Focused, Strength-based, Supportive, and Reframing   Therapist Response: Cln introduced DBT distress tolerance distraction skills. Cln provides context for distraction skills and why distraction is a foundational way to manage mood dysregulation. Group discussed how to apply distraction.    Therapist Response: Pt engaged in discussion and reports understanding of how distraction can be applied to manage big feelings.           Session Time: 11:00 -12:00   Participation Level: Active   Behavioral Response: CasualAlertDepressed   Type of Therapy: Group Therapy   Treatment Goals addressed: Coping   Progress Towards Goals: Progressing   Interventions: CBT, DBT, Solution Focused, Strength-based, Supportive, and Reframing   Summary: Cln continued topic of DBT distress tolerance skills. Cln introduced the ACCEPTS distraction skills and group discusses ways to utilize "A" activities.   Therapist Response:  Pt engaged in discussion and is able to state ways to apply this skill.        Session Time: 12:00 -1:00   Participation Level: Active   Behavioral Response: CasualAlertDepressed   Type of Therapy: Group therapy   Treatment Goals addressed: Coping  Progress Towards Goals: Progressing   Interventions: OT group   Summary: 12:00 - 12:50: Occupational Therapy group with cln E. Hollan.  12:50 - 1:00 Clinician assessed for immediate needs, medication compliance and efficacy, and  safety concerns.   Therapist Response: 12:00 - 12:50: See note 12:50 - 1:00 pm: At check-out, patient reports no immediate concerns. Patient demonstrates progress as evidenced by continued engagement and responsiveness to treatment. Patient denies SI/HI/self-harm thoughts at the end of group.  Suicidal/Homicidal: Nowithout intent/plan  Plan: Pt will continue in PHP while working to decrease depression and anxiety symptoms, increase daily functioning, and increase ability to manage symptoms in a healthy manner.   Collaboration of Care: Medication Management AEB J Cyndie Chime  Patient/Guardian was advised Release of Information must be obtained prior to any record release in order to collaborate their care with an outside provider. Patient/Guardian was advised if they have not already done so to contact the registration department to sign all necessary forms in order for [place] to release information regarding their care.   Consent: Patient/Guardian gives verbal consent for treatment and assignment of benefits for services provided during this visit. Patient/Guardian expressed understanding and agreed to proceed.   Diagnosis: Severe episode of recurrent major depressive disorder, without psychotic features (HCC) [F33.2]    1. Severe episode of recurrent major depressive disorder, without psychotic features (HCC)   2. GAD (generalized anxiety disorder)      Angelica Guiles, LCSW

## 2023-09-09 NOTE — Psych (Signed)
Virtual Visit via Video Note  I connected with Angelica Robertson on 08/22/23 at  9:00 AM EDT by a video enabled telemedicine application and verified that I am speaking with the correct person using two identifiers.  Location: Patient: patient home Provider: clinical home office   I discussed the limitations of evaluation and management by telemedicine and the availability of in person appointments. The patient expressed understanding and agreed to proceed.  I discussed the assessment and treatment plan with the patient. The patient was provided an opportunity to ask questions and all were answered. The patient agreed with the plan and demonstrated an understanding of the instructions.   The patient was advised to call back or seek an in-person evaluation if the symptoms worsen or if the condition fails to improve as anticipated.  Pt was provided 180 minutes of non-face-to-face time during this encounter.   Donia Guiles, LCSW   [facility] PHP THERAPIST PROGRESS NOTE  SHWETA AMAN [id number]  Session Time: 9:00 - 10:00  Participation Level: Active  Behavioral Response: CasualAlertAnxious and Depressed  Type of Therapy: Group Therapy  Treatment Goals addressed: Coping  Progress Towards Goals: Progressing  Interventions: CBT, DBT, Supportive, and Reframing  Summary: MALLI FALOTICO is a 77 y.o. female who presents with depression and anxiety symptoms.  Clinician led check-in regarding current stressors and situation, and review of patient completed daily inventory. Clinician utilized active listening and empathetic response and validated patient emotions. Clinician facilitated processing group on pertinent issues.?    Therapist Response:  Patient arrived within time allowed. Patient rates her mood at a 5 on a scale of 1-10 with 10 being best. Pt states she feels "okay." Pt states she slept 7 hours and ate 3x small portions. Pt reports yesterday was "okay" and she sat on the porch and  watched tv. Pt reports continued struggle with headaches and neck/back pain.  Patient able to process. Patient engaged in discussion.            Session Time: 10:00 am - 11:00 am   Participation Level: Active   Behavioral Response: CasualAlertDepressed   Type of Therapy: Group Therapy   Treatment Goals addressed: Coping   Progress Towards Goals: Progressing   Interventions: CBT, DBT, Solution Focused, Strength-based, Supportive, and Reframing   Therapist Response: Cln continued topic of DBT distress tolerance skills and the ACCEPTS distraction skill. Group reviewed C-C-E skills and discussed how they can practice them in their every day life.    Therapist Response: Pt engaged in discussion and determines ways to practice each skill.         Session Time: 11:00 -12:00   Participation Level: Active   Behavioral Response: CasualAlertDepressed   Type of Therapy: Group Therapy   Treatment Goals addressed: Coping   Progress Towards Goals: Progressing   Interventions: CBT, DBT, Solution Focused, Strength-based, Supportive, and Reframing   Summary: Cln continued topic of DBT distress tolerance skills and the ACCEPTS distraction skill. Group reviewed P-S skills and discussed how they can practice them in their every day life.    Therapist Response:  Pt engaged in discussion and is able to state ways to apply this skill.     ** Pt chose to leave group at noon citing pain issues.    Suicidal/Homicidal: Nowithout intent/plan  Plan: Pt will discharge from PHP per her request due to pain interfering with her ability to sit and be mentally present for the length of group. Pt reports sufficient progress to feel  comfortable in outpatient services. Pt and provider are aligned with discharge. Pt will return to previous providers and psychiatrist is informed. Pt denies SI/HI at time of discharge.  Collaboration of Care: Medication Management AEB J Cyndie Chime  Patient/Guardian was  advised Release of Information must be obtained prior to any record release in order to collaborate their care with an outside provider. Patient/Guardian was advised if they have not already done so to contact the registration department to sign all necessary forms in order for [place] to release information regarding their care.   Consent: Patient/Guardian gives verbal consent for treatment and assignment of benefits for services provided during this visit. Patient/Guardian expressed understanding and agreed to proceed.   Diagnosis: Major depressive disorder, recurrent, severe without psychotic features (HCC) [F33.2]    1. Major depressive disorder, recurrent, severe without psychotic features (HCC)   2. Generalized anxiety disorder      Donia Guiles, LCSW

## 2023-09-09 NOTE — Psych (Signed)
Virtual Visit via Video Note  I connected with Angelica Robertson on 08/20/23 at  9:00 AM EDT by a video enabled telemedicine application and verified that I am speaking with the correct person using two identifiers.  Location: Patient: patient home Provider: clinical home office   I discussed the limitations of evaluation and management by telemedicine and the availability of in person appointments. The patient expressed understanding and agreed to proceed.  I discussed the assessment and treatment plan with the patient. The patient was provided an opportunity to ask questions and all were answered. The patient agreed with the plan and demonstrated an understanding of the instructions.   The patient was advised to call back or seek an in-person evaluation if the symptoms worsen or if the condition fails to improve as anticipated.  Pt was provided 240 minutes of non-face-to-face time during this encounter.   Donia Guiles, LCSW   [facility] PHP THERAPIST PROGRESS NOTE  Angelica Robertson [id number]  Session Time: 9:00 - 10:00  Participation Level: Active  Behavioral Response: CasualAlertAnxious and Depressed  Type of Therapy: Group Therapy  Treatment Goals addressed: Coping  Progress Towards Goals: Progressing  Interventions: CBT, DBT, Supportive, and Reframing  Summary: Angelica Robertson is a 77 y.o. female who presents with depression and anxiety symptoms.  Clinician led check-in regarding current stressors and situation, and review of patient completed daily inventory. Clinician utilized active listening and empathetic response and validated patient emotions. Clinician facilitated processing group on pertinent issues.?    Therapist Response:  Patient arrived within time allowed. Patient rates her mood at a 5 on a scale of 1-10 with 10 being best. Pt states she feels "okay." Pt states she slept 6 hours and ate 3x small portions. Pt shares continued issues with pain and becoming more frustrated  with the ongoing neck pain and headaches. Pt states she is following doctor's orders and awaiting further specialty appointments. Pt states she spent the weekend watching sports and resting.  Pt reports continued hopelessness and passive SI. Patient able to process. Patient engaged in discussion.            Session Time: 10:00 am - 11:00 am   Participation Level: Active   Behavioral Response: CasualAlertDepressed   Type of Therapy: Group Therapy   Treatment Goals addressed: Coping   Progress Towards Goals: Progressing   Interventions: CBT, DBT, Solution Focused, Strength-based, Supportive, and Reframing   Therapist Response: Cln led processing group for pt's current struggles. Group members shared stressors and provided support and feedback. Cln brought in topics of boundaries, healthy relationships, and unhealthy thought processes to inform discussion.    Therapist Response: Pt able to process and provide support to group.          Session Time: 11:00 -12:00   Participation Level: Active   Behavioral Response: CasualAlertDepressed   Type of Therapy: Group Therapy   Treatment Goals addressed: Coping   Progress Towards Goals: Progressing   Interventions: CBT, DBT, Solution Focused, Strength-based, Supportive, and Reframing   Summary: Cln led discussion on Maslow's Hierarchy of Needs and how it can inform the way we approach prioritization, self care, and recovery.    Therapist Response:  Pt engaged in discussion and reports increased insight.        Session Time: 12:00 -1:00   Participation Level: Active   Behavioral Response: CasualAlertDepressed   Type of Therapy: Group therapy   Treatment Goals addressed: Coping   Progress Towards Goals: Progressing   Interventions:  OT group   Summary: 12:00 - 12:50: Occupational Therapy group with cln E. Hollan.  12:50 - 1:00 Clinician assessed for immediate needs, medication compliance and efficacy, and safety  concerns.   Therapist Response: 12:00 - 12:50: See note 12:50 - 1:00 pm: At check-out, patient reports no immediate concerns. Patient demonstrates progress as evidenced by continued engagement and responsiveness to treatment. Patient denies SI/HI/self-harm thoughts at the end of group.  Suicidal/Homicidal: Nowithout intent/plan  Plan: Pt will continue in PHP while working to decrease depression and anxiety symptoms, increase daily functioning, and increase ability to manage symptoms in a healthy manner.   Collaboration of Care: Medication Management AEB J Cyndie Chime  Patient/Guardian was advised Release of Information must be obtained prior to any record release in order to collaborate their care with an outside provider. Patient/Guardian was advised if they have not already done so to contact the registration department to sign all necessary forms in order for [place] to release information regarding their care.   Consent: Patient/Guardian gives verbal consent for treatment and assignment of benefits for services provided during this visit. Patient/Guardian expressed understanding and agreed to proceed.   Diagnosis: Severe episode of recurrent major depressive disorder, without psychotic features (HCC) [F33.2]    1. Severe episode of recurrent major depressive disorder, without psychotic features (HCC)   2. GAD (generalized anxiety disorder)      Donia Guiles, LCSW

## 2023-09-10 NOTE — Psych (Signed)
Pt absent from PHP due to a medical appointment.

## 2023-09-11 ENCOUNTER — Other Ambulatory Visit: Payer: Self-pay | Admitting: Psychiatry

## 2023-09-11 DIAGNOSIS — F411 Generalized anxiety disorder: Secondary | ICD-10-CM

## 2023-09-12 NOTE — Progress Notes (Signed)
It is major depressive disorder, severe, recurrent without psychotic features

## 2023-09-17 DIAGNOSIS — F411 Generalized anxiety disorder: Secondary | ICD-10-CM

## 2023-09-17 DIAGNOSIS — F332 Major depressive disorder, recurrent severe without psychotic features: Secondary | ICD-10-CM

## 2023-09-18 DIAGNOSIS — F331 Major depressive disorder, recurrent, moderate: Secondary | ICD-10-CM | POA: Diagnosis not present

## 2023-09-18 DIAGNOSIS — F4323 Adjustment disorder with mixed anxiety and depressed mood: Secondary | ICD-10-CM | POA: Diagnosis not present

## 2023-09-21 ENCOUNTER — Telehealth: Payer: Self-pay | Admitting: Psychiatry

## 2023-09-21 ENCOUNTER — Emergency Department: Payer: Medicare PPO

## 2023-09-21 ENCOUNTER — Other Ambulatory Visit: Payer: Self-pay

## 2023-09-21 ENCOUNTER — Emergency Department
Admission: EM | Admit: 2023-09-21 | Discharge: 2023-09-22 | Disposition: A | Discharge status: Home / Self Care | Payer: Medicare PPO | Attending: Emergency Medicine | Admitting: Emergency Medicine

## 2023-09-21 ENCOUNTER — Encounter: Payer: Self-pay | Admitting: Emergency Medicine

## 2023-09-21 DIAGNOSIS — R45851 Suicidal ideations: Secondary | ICD-10-CM | POA: Insufficient documentation

## 2023-09-21 DIAGNOSIS — Z638 Other specified problems related to primary support group: Secondary | ICD-10-CM | POA: Diagnosis not present

## 2023-09-21 DIAGNOSIS — F329 Major depressive disorder, single episode, unspecified: Secondary | ICD-10-CM | POA: Diagnosis not present

## 2023-09-21 DIAGNOSIS — F419 Anxiety disorder, unspecified: Secondary | ICD-10-CM | POA: Insufficient documentation

## 2023-09-21 DIAGNOSIS — F32A Depression, unspecified: Secondary | ICD-10-CM | POA: Diagnosis not present

## 2023-09-21 DIAGNOSIS — R519 Headache, unspecified: Secondary | ICD-10-CM | POA: Insufficient documentation

## 2023-09-21 DIAGNOSIS — Z79899 Other long term (current) drug therapy: Secondary | ICD-10-CM | POA: Insufficient documentation

## 2023-09-21 HISTORY — DX: Essential (primary) hypertension: I10

## 2023-09-21 LAB — CBC
HCT: 41 % (ref 36.0–46.0)
Hemoglobin: 13.5 g/dL (ref 12.0–15.0)
MCH: 30.1 pg (ref 26.0–34.0)
MCHC: 32.9 g/dL (ref 30.0–36.0)
MCV: 91.5 fL (ref 80.0–100.0)
Platelets: 336 10*3/uL (ref 150–400)
RBC: 4.48 MIL/uL (ref 3.87–5.11)
RDW: 12.4 % (ref 11.5–15.5)
WBC: 10.8 10*3/uL — ABNORMAL HIGH (ref 4.0–10.5)
nRBC: 0 % (ref 0.0–0.2)

## 2023-09-21 LAB — COMPREHENSIVE METABOLIC PANEL
ALT: 25 U/L (ref 0–44)
AST: 26 U/L (ref 15–41)
Albumin: 4.5 g/dL (ref 3.5–5.0)
Alkaline Phosphatase: 49 U/L (ref 38–126)
Anion gap: 10 (ref 5–15)
BUN: 15 mg/dL (ref 8–23)
CO2: 28 mmol/L (ref 22–32)
Calcium: 10.1 mg/dL (ref 8.9–10.3)
Chloride: 98 mmol/L (ref 98–111)
Creatinine, Ser: 0.69 mg/dL (ref 0.44–1.00)
GFR, Estimated: >60 mL/min (ref >60)
Glucose, Bld: 132 mg/dL — ABNORMAL HIGH (ref 70–99)
Potassium: 4.1 mmol/L (ref 3.5–5.1)
Sodium: 136 mmol/L (ref 135–145)
Total Bilirubin: 0.9 mg/dL (ref 0.3–1.2)
Total Protein: 7.3 g/dL (ref 6.5–8.1)

## 2023-09-21 LAB — URINE DRUG SCREEN, QUALITATIVE (ARMC ONLY)
Amphetamines, Ur Screen: NOT DETECTED
Barbiturates, Ur Screen: NOT DETECTED
Benzodiazepine, Ur Scrn: NOT DETECTED
Cannabinoid 50 Ng, Ur ~~LOC~~: NOT DETECTED
Cocaine Metabolite,Ur ~~LOC~~: NOT DETECTED
MDMA (Ecstasy)Ur Screen: NOT DETECTED
Methadone Scn, Ur: NOT DETECTED
Opiate, Ur Screen: NOT DETECTED
Phencyclidine (PCP) Ur S: NOT DETECTED
Tricyclic, Ur Screen: NOT DETECTED

## 2023-09-21 LAB — ETHANOL: Alcohol, Ethyl (B): <10 mg/dL (ref <10)

## 2023-09-21 LAB — ACETAMINOPHEN LEVEL: Acetaminophen (Tylenol), Serum: <10 ug/mL — ABNORMAL LOW (ref 10–30)

## 2023-09-21 LAB — SALICYLATE LEVEL: Salicylate Lvl: <7 mg/dL — ABNORMAL LOW (ref 7.0–30.0)

## 2023-09-21 MED ORDER — ESCITALOPRAM OXALATE 10 MG PO TABS
10.0000 mg | ORAL_TABLET | Freq: Two times a day (BID) | ORAL | Status: DC
  Administered 2023-09-21 – 2023-09-22 (×2): 10 mg via ORAL
  Filled 2023-09-21 (×3): qty 1

## 2023-09-21 MED ORDER — MIRTAZAPINE 15 MG PO TABS
22.5000 mg | ORAL_TABLET | Freq: Every day | ORAL | Status: DC
  Administered 2023-09-21: 22.5 mg via ORAL
  Filled 2023-09-21: qty 2

## 2023-09-21 MED ORDER — LOSARTAN POTASSIUM 50 MG PO TABS
25.0000 mg | ORAL_TABLET | Freq: Every day | ORAL | Status: DC
  Administered 2023-09-21: 25 mg via ORAL
  Filled 2023-09-21: qty 1

## 2023-09-21 MED ORDER — ACETAMINOPHEN 500 MG PO TABS
1000.0000 mg | ORAL_TABLET | Freq: Once | ORAL | Status: AC
  Administered 2023-09-21: 1000 mg via ORAL
  Filled 2023-09-21: qty 2

## 2023-09-21 MED ORDER — REXULTI 0.5 MG PO TABS
0.5000 mg | ORAL_TABLET | Freq: Every day | ORAL | 0 refills | Status: DC
Start: 2023-09-21 — End: 2023-10-19

## 2023-09-21 MED ORDER — BREXPIPRAZOLE 0.25 MG PO TABS
0.5000 mg | ORAL_TABLET | Freq: Every day | ORAL | Status: DC
  Administered 2023-09-22: 0.5 mg via ORAL
  Filled 2023-09-21: qty 2

## 2023-09-21 MED ORDER — PANTOPRAZOLE SODIUM 20 MG PO TBEC
20.0000 mg | DELAYED_RELEASE_TABLET | Freq: Every day | ORAL | Status: DC
  Filled 2023-09-21: qty 1

## 2023-09-21 MED ORDER — AMLODIPINE BESYLATE 5 MG PO TABS
2.5000 mg | ORAL_TABLET | Freq: Every day | ORAL | Status: DC
  Administered 2023-09-22: 2.5 mg via ORAL
  Filled 2023-09-21 (×2): qty 1

## 2023-09-21 MED ORDER — ROSUVASTATIN CALCIUM 20 MG PO TABS
20.0000 mg | ORAL_TABLET | Freq: Every day | ORAL | Status: DC
  Administered 2023-09-21: 20 mg via ORAL
  Filled 2023-09-21: qty 1

## 2023-09-21 NOTE — ED Notes (Signed)
Pt requesting at home medications. EDP made aware.

## 2023-09-21 NOTE — Telephone Encounter (Signed)
Patient spouse stopped by this morning. Patient has not felt any better and is taking her to [facility] emergency. Wanted you to be aware

## 2023-09-21 NOTE — ED Notes (Signed)
Pt transported to CT ?

## 2023-09-21 NOTE — Consult Note (Signed)
[facility] ED ASSESSMENT   Reason for Consult:  Psych admission Referring Physician:  Dr Fuller Plan Patient Identification: LENNAN MALONE MRN:  [id number] ED Chief Complaint: <principal problem not specified>  Diagnosis:  Active Problems:   * No active hospital problems. *   ED Assessment Time Calculation: No data recorded  Subjective:  "I have been depressed for 4 months...been on different medications  but not feeling better" Past Psychiatric History: Afnan Cadiente is a 77 year-old female who presents  to ED voluntarily, accompanied by her husband  Maison Agrusa [phone]. Patient reports that she has been experiencing increased depression. Reports that she has tried many antidepressants with no improvement.  Patient reports that she had a neck injury a while ago and the pain has been on and off.  She reports that she wakes up around 4 AM every day with "a lot of things in my head"  Patient reports a hx of depression for which she sees Dr Elna Breslow. Per chart review, patient's spouse passed by her office this morning to report that patient has not been feeling any better and her current medications are not helping. On 09/17/2023, Rexulti was increased by Dr Elna Breslow from 0.25 mg to 0.5 mg and patient has been taking the new dose with no improvement. Patient's other medications include Lexapro 10 mg PO BID, Remeron 15 mg PO HS, Amlodipine 2.5 mg PO daily, Losartan 25 mg PO HS, Protonix 20 mg PO daily and Rosuvastatin 20 mg po HS.   Patient reports that she has lost her appetite and has lost a lot of weight.   Per patient's spouse, patient is very emotional and family issues affect her tremendously. Patient and soupse have two grown children together. Their son is supportive but their daughter "doesn't want to do anything with [place]".  Patient has been experiencing difficulty to accept this situation and move on. Spouse reports that patient had the same presenting symptoms a year ago, received inpatient treatment and  symptoms were eased.   He reports that patient would benefit from inpatient and he is worried about patient's well being considering that she has not been eating, she is shaking with increased hopelessness and helplessness. Patient does not verbalize self-harm thoughts but frequently makes statements indicating hopelessness/helplessness.  Patient denies use of illicit drugs. Denies abuse or neglect and reports that she is supported by her husband and son.   Assessment:  Patient is evaluated face-to-face by this provider. Patient allowed her husband to participate in the assessment. 77 year-old female sitting in a chair. She is appropriately dressed and groomed. She is cooperative upon approach. Alert and oriented x 4. She appears sad and depressed, and shaking. There is no indication that she is responding to internal stimuli. Patient does not appear to be preoccupied. She has good eye contacted and her thought process is clear, organized and goal-directed. Patient is able to verbalize her feelings and concerns.  She reports that she has been feeling increasingly depressed for about 4 months and her provider has tried to change her medications with no improvement.  She reports  having on and off neck pain related to an injury. Reports depressive symptoms including insomnia, increased worrying, sadness, loss of appetite, hopelessness, helplessness and anhedonia.  Patient reports that she sees Victorino Dike at [facility] for therapy but it does not seem to help.  She reports that her current medications do not seem to help. Rexulti was recently increased from 0.25 mg to 0.5 mg. She has been on this  medication for more than 2 weeks but has not noticed any improvement.  Patient reports that she does not enjoy anything  and "I just don't know what to do".   Patient's spouse reports that their relationship with their daughter has a lot to do with patient's emotional crisis: patient has not been able to accept the situation  and her depression continues to increase. Patient has not been able to see her daughter or grand kids  and this has been a burden for her. Patient has lost a lot of weight because she lost her appetite. She does not enjoy anything. Spouse believe that he is worried about patient's wellbeing and recommends inpatient  stabilization.   Patient presents with active symptoms of depression with a significant past psychiatric condition. It appears that she would benefit from inpatient stabilization to address her increased depression and related symptoms. I discussed with TTS and AC for inpatient bed availability and was informed that currently there is no geriatric bed available. We will continue to monitor and provide support and encouragements. Patient  will be referred to other psychiatric services for inpatient admission and treatment. Patient will continue on her current medication regimen.   Risk to Self or Others: Is the patient at risk to self? No Has the patient been a risk to self in the past 6 months? No Has the patient been a risk to self within the distant past? No Is the patient a risk to others? No Has the patient been a risk to others in the past 6 months? No Has the patient been a risk to others within the distant past? No  [place] Scale:  Flowsheet Row ED from 09/21/2023 in [facility] Emergency Department at [facility] Visit from 08/28/2023 in confidential department Counselor from 08/01/2023 in BEHAVIORAL HEALTH PARTIAL HOSPITALIZATION PROGRAM  C-SSRS RISK CATEGORY Low Risk No Risk Error: Q3, 4, or 5 should not be populated when Q2 is No       AIMS:  , , ,  ,   ASAM:    Substance Abuse:  Alcohol / Drug Use Pain Medications: See PTA Prescriptions: See PTA Over the Counter: See PTA History of alcohol / drug use?: No history of alcohol / drug abuse Longest period of sobriety (when/how long): n/a  Past Medical History:  Past Medical History:  Diagnosis Date    Depression 2000   Heart murmur    Hypercholesterolemia Osteoporosis   Hyperlipemia     Past Surgical History:  Procedure Laterality Date   ABDOMINAL HYSTERECTOMY  1980   COLONOSCOPY WITH PROPOFOL N/A 03/05/2018   Procedure: COLONOSCOPY WITH PROPOFOL;  Surgeon: Toledo, Boykin Nearing, MD;  Location: [facility] ENDOSCOPY;  Service: Endoscopy;  Laterality: N/A;   COLONOSCOPY WITH PROPOFOL N/A 04/23/2023   Procedure: COLONOSCOPY WITH PROPOFOL;  Surgeon: Regis Bill, MD;  Location: [facility] ENDOSCOPY;  Service: Endoscopy;  Laterality: N/A;   Family History:  Family History  Problem Relation Age of Onset   Heart disease Mother    Diabetes Mother    Heart disease Father    Diabetes Father    Colon cancer Other    Congestive Heart Failure Brother    Colon cancer Brother    Mental illness Paternal Aunt    BRCA 1/2 Neg Hx    Breast cancer Neg Hx    Cowden syndrome Neg Hx    DES usage Neg Hx    Endometrial cancer Neg Hx    Li-Fraumeni syndrome Neg Hx  Ovarian cancer Neg Hx    Family Psychiatric  History: NA Social History:  Social History   Substance and Sexual Activity  Alcohol Use Yes   Alcohol/week: 0.0 standard drinks of alcohol   Comment: occasionally - socially during the summer a glass of wine or beer     Social History   Substance and Sexual Activity  Drug Use No    Social History   Socioeconomic History   Marital status: Married    Spouse name: robert   Number of children: 2   Years of education: Not on file   Highest education level: 12th grade  Occupational History   Not on file  Tobacco Use   Smoking status: Never   Smokeless tobacco: Never  Vaping Use   Vaping status: Never Used  Substance and Sexual Activity   Alcohol use: Yes    Alcohol/week: 0.0 standard drinks of alcohol    Comment: occasionally - socially during the summer a glass of wine or beer   Drug use: No   Sexual activity: Yes  Other Topics Concern   Not on file  Social History Narrative    Not on file   Social Determinants of Health   Financial Resource Strain: Low Risk  (04/29/2023)   Overall Financial Resource Strain (CARDIA)    Difficulty of Paying Living Expenses: Not hard at all  Food Insecurity: No Food Insecurity (04/29/2023)   Hunger Vital Sign    Worried About Running Out of Food in the Last Year: Never true    Ran Out of Food in the Last Year: Never true  Transportation Needs: No Transportation Needs (04/29/2023)   PRAPARE - Administrator, Civil Service (Medical): No    Lack of Transportation (Non-Medical): No  Physical Activity: Insufficiently Active (04/29/2023)   Exercise Vital Sign    Days of Exercise per Week: 3 days    Minutes of Exercise per Session: 30 min  Stress: No Stress Concern Present (04/29/2023)   [employer] of Occupational Health - Occupational Stress Questionnaire    Feeling of Stress : Not at all  Social Connections: Socially Integrated (04/29/2023)   Social Connection and Isolation Panel [NHANES]    Frequency of Communication with Friends and Family: Twice a week    Frequency of Social Gatherings with Friends and Family: Twice a week    Attends Religious Services: More than 4 times per year    Active Member of [employer] or Organizations: Yes    Attends Engineer, structural: More than 4 times per year    Marital Status: Married   Additional Social History:    Allergies:  No Known Allergies  Labs:  Results for orders placed or performed during the hospital encounter of 09/21/23 (from the past 48 hour(s))  Comprehensive metabolic panel     Status: Abnormal   Collection Time: 09/21/23 10:02 AM  Result Value Ref Range   Sodium 136 135 - 145 mmol/L   Potassium 4.1 3.5 - 5.1 mmol/L   Chloride 98 98 - 111 mmol/L   CO2 28 22 - 32 mmol/L   Glucose, Bld 132 (H) 70 - 99 mg/dL    Comment: Glucose reference range applies only to samples taken after fasting for at least 8 hours.   BUN 15 8 - 23 mg/dL   Creatinine, Ser  1.61 0.44 - 1.00 mg/dL   Calcium 09.6 8.9 - 04.5 mg/dL   Total Protein 7.3 6.5 - 8.1 g/dL   Albumin 4.5  3.5 - 5.0 g/dL   AST 26 15 - 41 U/L   ALT 25 0 - 44 U/L   Alkaline Phosphatase 49 38 - 126 U/L   Total Bilirubin 0.9 0.3 - 1.2 mg/dL   GFR, Estimated >16 >10 mL/min    Comment: (NOTE) Calculated using the CKD-EPI Creatinine Equation (2021)    Anion gap 10 5 - 15    Comment: Performed at [facility], [address] [place], [place] [address]  Ethanol     Status: None   Collection Time: 09/21/23 10:02 AM  Result Value Ref Range   Alcohol, Ethyl (B) <10 <10 mg/dL    Comment: (NOTE) Lowest detectable limit for serum alcohol is 10 mg/dL.  For medical purposes only. Performed at [facility], [address] [place], [place] [address]   Salicylate level     Status: Abnormal   Collection Time: 09/21/23 10:02 AM  Result Value Ref Range   Salicylate Lvl <7.0 (L) 7.0 - 30.0 mg/dL    Comment: Performed at [facility], [address] [place], [place] [address]  Acetaminophen level     Status: Abnormal   Collection Time: 09/21/23 10:02 AM  Result Value Ref Range   Acetaminophen (Tylenol), Serum <10 (L) 10 - 30 ug/mL    Comment: (NOTE) Therapeutic concentrations vary significantly. A range of 10-30 ug/mL  may be an effective concentration for many patients. However, some  are best treated at concentrations outside of this range. Acetaminophen concentrations >150 ug/mL at 4 hours after ingestion  and >50 ug/mL at 12 hours after ingestion are often associated with  toxic reactions.  Performed at [facility], [address] [place], [place] [address]   cbc     Status: Abnormal   Collection Time: 09/21/23 10:02 AM  Result Value Ref Range   WBC 10.8 (H) 4.0 - 10.5 K/uL   RBC 4.48 3.87 - 5.11 MIL/uL   Hemoglobin 13.5 12.0 - 15.0 g/dL   HCT 21.3 08.6 - 57.8 %   MCV 91.5 80.0 - 100.0 fL   MCH 30.1 26.0 - 34.0 pg   MCHC 32.9 30.0 - 36.0  g/dL   RDW 46.9 62.9 - 52.8 %   Platelets 336 150 - 400 K/uL   nRBC 0.0 0.0 - 0.2 %    Comment: Performed at [facility], [address] [place], [place] [address]  Urine Drug Screen, Qualitative     Status: None   Collection Time: 09/21/23 10:02 AM  Result Value Ref Range   Tricyclic, Ur Screen NONE DETECTED NONE DETECTED   Amphetamines, Ur Screen NONE DETECTED NONE DETECTED   MDMA (Ecstasy)Ur Screen NONE DETECTED NONE DETECTED   Cocaine Metabolite,Ur [place] NONE DETECTED NONE DETECTED   Opiate, Ur Screen NONE DETECTED NONE DETECTED   Phencyclidine (PCP) Ur S NONE DETECTED NONE DETECTED   Cannabinoid 50 Ng, Ur [place] NONE DETECTED NONE DETECTED   Barbiturates, Ur Screen NONE DETECTED NONE DETECTED   Benzodiazepine, Ur Scrn NONE DETECTED NONE DETECTED   Methadone Scn, Ur NONE DETECTED NONE DETECTED    Comment: (NOTE) Tricyclics + metabolites, urine    Cutoff 1000 ng/mL Amphetamines + metabolites, urine  Cutoff 1000 ng/mL MDMA (Ecstasy), urine              Cutoff 500 ng/mL Cocaine Metabolite, urine          Cutoff 300 ng/mL Opiate + metabolites, urine        Cutoff 300 ng/mL  Phencyclidine (PCP), urine         Cutoff 25 ng/mL Cannabinoid, urine                 Cutoff 50 ng/mL Barbiturates + metabolites, urine  Cutoff 200 ng/mL Benzodiazepine, urine              Cutoff 200 ng/mL Methadone, urine                   Cutoff 300 ng/mL  The urine drug screen provides only a preliminary, unconfirmed analytical test result and should not be used for non-medical purposes. Clinical consideration and professional judgment should be applied to any positive drug screen result due to possible interfering substances. A more specific alternate chemical method must be used in order to obtain a confirmed analytical result. Gas chromatography / mass spectrometry (GC/MS) is the preferred confirm atory method. Performed at [facility], [address], [place], [place] [address]      Current Facility-Administered Medications  Medication Dose Route Frequency Provider Last Rate Last Admin   amLODipine (NORVASC) tablet 2.5 mg  2.5 mg Oral Daily Concha Se, MD       [START ON 09/22/2023] brexpiprazole (REXULTI) tablet 0.5 mg  0.5 mg Oral Daily Concha Se, MD       escitalopram (LEXAPRO) tablet 10 mg  10 mg Oral BID Concha Se, MD       losartan (COZAAR) tablet 25 mg  25 mg Oral QHS Concha Se, MD       mirtazapine (REMERON) tablet 22.5 mg  22.5 mg Oral QHS Concha Se, MD       pantoprazole (PROTONIX) EC tablet 20 mg  20 mg Oral Daily Concha Se, MD       rosuvastatin (CRESTOR) tablet 20 mg  20 mg Oral QHS Concha Se, MD       Current Outpatient Medications  Medication Sig Dispense Refill   amLODipine (NORVASC) 2.5 MG tablet Take 1 tablet (2.5 mg total) by mouth daily. In am 90 tablet 3   Brexpiprazole (REXULTI) 0.5 MG TABS Take 1 tablet (0.5 mg total) by mouth daily. Dose change as requested 30 tablet 0   calcium carbonate (OS-CAL) 600 MG TABS tablet Take 600 mg by mouth 2 (two) times daily with a meal.     escitalopram (LEXAPRO) 20 MG tablet TAKE 1/2 TABLET BY MOUTH TWICE DAILY 30 tablet 1   losartan (COZAAR) 25 MG tablet TAKE 1 TABLET BY MOUTH ONCE DAILY (Patient taking differently: Take 25 mg by mouth at bedtime.) 90 tablet 1   mirtazapine (REMERON) 15 MG tablet Take 1.5 tablets (22.5 mg total) by mouth at bedtime. 135 tablet 1   pantoprazole (PROTONIX) 20 MG tablet Take 1 tablet (20 mg total) by mouth daily. 90 tablet 1   rosuvastatin (CRESTOR) 20 MG tablet Take 1 tablet (20 mg total) by mouth at bedtime. 90 tablet 3   VITAMIN D PO Take by mouth daily.      Musculoskeletal: Strength & Muscle Tone: within normal limits Gait & Station: normal Patient leans: N/A   Psychiatric Specialty Exam: Presentation  General Appearance: No data recorded Eye Contact:No data recorded Speech:No data recorded Speech Volume:No data recorded Handedness:No  data recorded  Mood and Affect  Mood:No data recorded Affect:No data recorded  Thought Process  Thought Processes:No data recorded Descriptions of Associations:No data recorded Orientation:No data recorded Thought Content:No data recorded History of Schizophrenia/Schizoaffective disorder:No  Duration  of Psychotic Symptoms:No data recorded Hallucinations:No data recorded Ideas of Reference:No data recorded Suicidal Thoughts:No data recorded Homicidal Thoughts:No data recorded  Sensorium  Memory:No data recorded Judgment:No data recorded Insight:No data recorded  Executive Functions  Concentration:No data recorded Attention Span:No data recorded Recall:No data recorded Fund of Knowledge:No data recorded Language:No data recorded  Psychomotor Activity  Psychomotor Activity:No data recorded  Assets  Assets:No data recorded   Sleep  Sleep:No data recorded  Physical Exam: Physical Exam Constitutional:      Appearance: Normal appearance.  HENT:     Head: Normocephalic and atraumatic.     Right Ear: Tympanic membrane normal.     Left Ear: Tympanic membrane normal.     Nose: Nose normal.     Mouth/Throat:     Mouth: Mucous membranes are moist.  Eyes:     Extraocular Movements: Extraocular movements intact.     Pupils: Pupils are equal, round, and reactive to light.  Cardiovascular:     Rate and Rhythm: Normal rate.     Pulses: Normal pulses.  Pulmonary:     Effort: Pulmonary effort is normal.  Musculoskeletal:        General: Normal range of motion.     Cervical back: Normal range of motion and neck supple.  Neurological:     General: No focal deficit present.     Mental Status: She is alert and oriented to person, place, and time.  Psychiatric:        Behavior: Behavior normal.    Review of Systems  Constitutional: Negative.   HENT: Negative.    Eyes: Negative.   Respiratory: Negative.    Cardiovascular: Negative.   Gastrointestinal: Negative.    Genitourinary: Negative.   Musculoskeletal: Negative.   Skin: Negative.   Neurological: Negative.   Endo/Heme/Allergies: Negative.   Psychiatric/Behavioral:  Positive for depression. The patient is nervous/anxious and has insomnia.    Blood pressure (!) 154/72, pulse 83, temperature 98 F (36.7 C), temperature source Oral, resp. rate 20, height 5\' 3"  (1.6 m), weight 56.2 kg, last menstrual period 11/27/1979, SpO2 96%. Body mass index is 21.97 kg/m.  Medical Decision Making: Recommend Inpatient admission.   Restart home medications as prescribed by Dr Elna Breslow  Problem 1: Depression  Problem 2: Anxiety  Problem 3: Family problems  Disposition: No evidence of imminent risk to self or others at present.   Recommend psychiatric Inpatient admission when medically cleared. Supportive therapy provided about ongoing stressors.  Olin Pia, NP 09/21/2023 4:07 PM

## 2023-09-21 NOTE — BH Assessment (Signed)
PATIENT BED AVAILABLE AFTER 7AM ON 09/22/23  Patient has been accepted to [facility].  Patient assigned to [facility] Accepting physician is Dr. Otho Perl.  Call report to [phone].  Representative was Deon.   ER Staff is aware of it:  [facility] ER Secretary  Dr. Derrill Kay, ER MD  Efrain Sella Patient's Nurse

## 2023-09-21 NOTE — ED Triage Notes (Signed)
Pt via POV from home. Pt c/o depression with suicidal thought without plan for the past 3 weeks. Denies HI. Denies ETOH/substance use. Pt has had recent changes to her medication. Pt is calm and cooperative during triage.

## 2023-09-21 NOTE — ED Notes (Signed)
Vol pending consult 

## 2023-09-21 NOTE — Telephone Encounter (Signed)
Noted  

## 2023-09-21 NOTE — Telephone Encounter (Signed)
Patient requested a dosage increase of Rexulti, have sent a higher dosage of Rexulti 0.5 mg to pharmacy today.

## 2023-09-21 NOTE — ED Provider Notes (Signed)
[facility] Provider Note    Event Date/Time   First MD Initiated Contact with Patient 09/21/23 1008     (approximate)   History   Depression   HPI  Angelica Robertson is a 77 y.o. female with history of depression who comes in for suicidal thoughts.  Patient denies any HI.  She states that for the past 3 weeks she has had worsening thoughts and depression.  She denies having a plan.  She reports being inpatient previously and feels like she needs to have that done again today.  She reports that she has been seeing a psychiatrist outpatient with some changes in her psychiatric medications but she is still having her symptoms now.  She reports feeling a little shaky and having some headaches but these been going on intermittently for some time now.  She denies headache being the worst headache of her life.  Physical Exam   Triage Vital Signs: ED Triage Vitals  Encounter Vitals Group     BP 09/21/23 0952 (!) 154/72     Systolic BP Percentile --      Diastolic BP Percentile --      Pulse Rate 09/21/23 0952 83     Resp 09/21/23 0952 20     Temp 09/21/23 0952 98 F (36.7 C)     Temp Source 09/21/23 0952 Oral     SpO2 09/21/23 0952 96 %     Weight 09/21/23 0949 124 lb (56.2 kg)     Height 09/21/23 0949 5\' 3"  (1.6 m)     Head Circumference --      Peak Flow --      Pain Score 09/21/23 0949 4     Pain Loc --      Pain Education --      Exclude from Growth Chart --     Most recent vital signs: Vitals:   09/21/23 0952  BP: (!) 154/72  Pulse: 83  Resp: 20  Temp: 98 F (36.7 C)  SpO2: 96%     General: Awake, no distress.  CV:  Good peripheral perfusion.  Resp:  Normal effort.  Abd:  No distention.  Other:  Moving all extremities well.  No obvious cranial nerve deficits.   ED Results / Procedures / Treatments   Labs (all labs ordered are listed, but only abnormal results are displayed) Labs Reviewed  COMPREHENSIVE METABOLIC PANEL  ETHANOL   SALICYLATE LEVEL  ACETAMINOPHEN LEVEL  CBC  URINE DRUG SCREEN, QUALITATIVE ([facility] ONLY)     RADIOLOGY I have reviewed the CT head personally and interpreted no evidence of intracranial hemorrhage  PROCEDURES:  Critical Care performed: No  Procedures   MEDICATIONS ORDERED IN ED: Medications  acetaminophen (TYLENOL) tablet 1,000 mg (has no administration in time range)     IMPRESSION / MDM / ASSESSMENT AND PLAN / ED COURSE  I reviewed the triage vital signs and the nursing notes.   Patient's presentation is most consistent with acute presentation with potential threat to life or bodily function.   Pt is without any acute medical complaints except for concerns that she might be having some reaction to her medications including some mild headaches.  Discussed with patient CT imaging she would like to proceed.  No exam findings to suggest medical cause of current presentation. Will order psychiatric screening labs and discuss further w/ psychiatric service.  D/d includes but is not limited to psychiatric disease, behavioral/personality disorder, inadequate socioeconomic support, medical.  Based on  HPI, exam, unremarkable labs, no concern for acute medical problem at this time. No rigidity, clonus, hyperthermia, focal neurologic deficit, diaphoresis, tachycardia, meningismus, ataxia, gait abnormality or other finding to suggest this visit represents a non-psychiatric problem. Screening labs reviewed.    Given this, pt medically cleared, to be dispositioned per Psych.  Urine drug was negative.  CMP reassuring.  Alcohol negative salicylate negative Tylenol negative CBC shows slightly elevated white count but no other infectious symptoms.  The patient has been placed in psychiatric observation due to the need to provide a safe environment for the patient while obtaining psychiatric consultation and evaluation, as well as ongoing medical and medication management to treat the patient's  condition.  The patient has not been placed under full IVC at this time.        FINAL CLINICAL IMPRESSION(S) / ED DIAGNOSES   Final diagnoses:  Depression, unspecified depression type     Rx / DC Orders   ED Discharge Orders     None        Note:  This document was prepared using Dragon voice recognition software and may include unintentional dictation errors.   Concha Se, MD 09/21/23 1115

## 2023-09-21 NOTE — ED Notes (Signed)
PT belongings include: Black shoes Black socks  Black pants White underwear Light green sweater Tan bra

## 2023-09-21 NOTE — BH Assessment (Signed)
Per [facility] AC Tresa Endo S), patient to be referred out of system.  Referral information for Psychiatric Hospitalization faxed to;   [facility] [phone]), No available bed.  [facility] [phone]- [phone]),   [facility] [phone]) [phone]  [facility] [phone]),  [place] [phone], [phone], [phone] or [phone]),   [place] [phone]--- [phone]--- [phone])  [address] [phone]),   [facility] [phone]- [phone]),   [facility] [phone]),  [place] [phone])  [place] [phone] or [phone]),   [facility] [phone]).  [facility] [phone])

## 2023-09-21 NOTE — BH Assessment (Signed)
Comprehensive Clinical Assessment (CCA) Note  09/21/2023 [name] [id number]  Chief Complaint:  Chief Complaint  Patient presents with   Depression   Visit Diagnosis: Major Depression   [facility]. [place] is a 77 year old female who presents to the ER, via her husband. Patient and husband report her depression has increased within the last three to four months. The last two weeks have been the worse. Her psychiatric outpatient provider, [facility], have made several medication adjustments but her symptoms continue to worsen. Her sleep has decreased, as well as her appetite. She has loss weight. She's no longer doing things she enjoys, like reading. She's been irritable and her patience has decreased. She isn't rude nor is she demanding. However, she's been anxious and rushing herself and her husband to complete tasks. Which isn't like her.  During the interview, she was calm, cooperative and pleasant. She was able to provide appropriate answers to the questions. Throughout the interview, she denied HI and AV/H. She has no active thoughts of suicide, but shared "I sometimes have the thoughts that my family is better off without me."  CCA Screening, Triage and Referral (STR)  Patient Reported Information How did you hear about [place]? Other (Comment)  What Is the Reason for Your Visit/Call Today? Increase symptoms of depression.  How Long Has This Been Causing You Problems? 1 wk - 1 month  What Do You Feel Would Help You the Most Today? Treatment for Depression or other mood problem   Have You Recently Had Any Thoughts About Hurting Yourself? No  Are You Planning to Commit Suicide/Harm Yourself At This time? No   Flowsheet Row ED from 09/21/2023 in [facility] Emergency Department at [facility] Visit from 08/28/2023 in confidential department Counselor from 08/01/2023 in BEHAVIORAL HEALTH PARTIAL HOSPITALIZATION PROGRAM  C-SSRS RISK CATEGORY Low Risk No  Risk Error: Q3, 4, or 5 should not be populated when Q2 is No       Have you Recently Had Thoughts About Hurting Someone [name]? No  Are You Planning to Harm Someone at This Time? No  Explanation: No data recorded  Have You Used Any Alcohol or Drugs in the Past 24 Hours? No  What Did You Use and How Much? No data recorded  Do You Currently Have a Therapist/Psychiatrist? Yes  Name of Therapist/Psychiatrist: Name of Therapist/Psychiatrist: [facility] (Dr. Elna Robertson)   Have You Been Recently Discharged From Any Office Practice or Programs? No  Explanation of Discharge From Practice/Program: No data recorded    CCA Screening Triage Referral Assessment Type of Contact: Face-to-Face  Telemedicine Service Delivery:   Is this Initial or Reassessment?   Date Telepsych consult ordered in [facility]:    Time Telepsych consult ordered in [facility]:    Location of Assessment: [facility] ED  Provider Location: [facility] ED   Collateral Involvement: chart review   Does Patient Have a Court Appointed Legal Guardian? No  Legal Guardian Contact Information: No data recorded Copy of Legal Guardianship Form: No data recorded Legal Guardian Notified of Arrival: No data recorded Legal Guardian Notified of Pending Discharge: No data recorded If Minor and Not Living with Parent(s), Who has Custody? No data recorded Is CPS involved or ever been involved? Never  Is APS involved or ever been involved? Never   Patient Determined To Be At Risk for Harm To Self or Others Based on Review of Patient Reported Information or Presenting Complaint? No  Method: No Plan  Availability of Means: No  access or NA  Intent: No data recorded Notification Required: No data recorded Additional Information for Danger to Others Potential: No data recorded Additional Comments for Danger to Others Potential: No data recorded Are There Guns or Other Weapons in Your Home? No  Types of Guns/Weapons: Pt reports her  husband owns firearm but they are locked and secured  Are These Weapons Safely Secured?                            No  Who Could Verify You Are Able To Have These Secured: Husband  Do You Have any Outstanding Charges, Pending Court Dates, Parole/Probation? no  Contacted To Inform of Risk of Harm To Self or Others: No data recorded   Does Patient Present under Involuntary Commitment? No    [place] of Residence: [place]   Patient Currently Receiving the Following Services: Medication Management   Determination of Need: Emergent (2 hours)   Options For Referral: Inpatient Hospitalization; ED Visit     CCA Biopsychosocial Patient Reported Schizophrenia/Schizoaffective Diagnosis in Past: No   Strengths: Stable housing, have support and have outpatient provider.   Mental Health Symptoms Depression:   Change in energy/activity; Fatigue; Increase/decrease in appetite; Sleep (too much or little); Weight gain/loss   Duration of Depressive symptoms:  Duration of Depressive Symptoms: Greater than two weeks   Mania:   None   Anxiety:    Difficulty concentrating; Worrying   Psychosis:   None   Duration of Psychotic symptoms:    Trauma:   N/A   Obsessions:   N/A   Compulsions:   N/A   Inattention:   N/A   Hyperactivity/Impulsivity:   N/A   Oppositional/Defiant Behaviors:   N/A   Emotional Irregularity:   N/A   Other Mood/Personality Symptoms:  No data recorded   Mental Status Exam Appearance and self-care  Stature:   Small   Weight:   Average weight   Clothing:   Neat/clean; Age-appropriate   Grooming:   Normal   Cosmetic use:   None   Posture/gait:   Other (Comment)   Motor activity:   -- (Within normal range)   Sensorium  Attention:   Normal   Concentration:   Normal   Orientation:   X5   Recall/memory:   Normal   Affect and Mood  Affect:   Depressed; Full Range   Mood:   Depressed   Relating  Eye contact:    Normal   Facial expression:   Depressed   Attitude toward examiner:   Cooperative   Thought and Language  Speech flow:  Clear and Coherent; Normal   Thought content:   Appropriate to Mood and Circumstances   Preoccupation:   None   Hallucinations:   None   Organization:   Coherent; Intact   [employer] of Knowledge:   Good   Intelligence:   Average   Abstraction:   Normal; Functional   Judgement:   Normal   Reality Testing:   Adequate; Realistic   Insight:   Good   Decision Making:   Normal   Social Functioning  Social Maturity:   Responsible   Social Judgement:   Normal   Stress  Stressors:   Other (Comment)   Coping Ability:   Normal   Skill Deficits:   None   Supports:   Family; Friends/Service system     Religion:    Leisure/Recreation: Leisure / Recreation Do You  Have Hobbies?: Yes Leisure and Hobbies: Enjoy reading  Exercise/Diet: Exercise/Diet Do You Exercise?: No Have You Gained or Lost A Significant Amount of Weight in the Past Six Months?: Yes-Lost Do You Follow a Special Diet?: No Do You Have Any Trouble Sleeping?: Yes Explanation of Sleeping Difficulties: Waking early.   CCA Employment/Education Employment/Work Situation: Employment / Work Situation Employment Situation: Retired Passenger transport manager has Been Impacted by Current Illness: No Has Patient ever Been in Equities trader?: No  Education: Education Is Patient Currently Attending School?: No Did You Product manager?: No Did You Have An Individualized Education Program (IIEP): No Did You Have Any Difficulty At [employer]?: No Patient's Education Has Been Impacted by Current Illness: No   CCA Family/Childhood History Family and Relationship History: Family history Marital status: Married Does patient have children?: Yes  Childhood History:  Childhood History By whom was/is the patient raised?: Mother Did patient suffer any  verbal/emotional/physical/sexual abuse as a child?: No Did patient suffer from severe childhood neglect?: No Has patient ever been sexually abused/assaulted/raped as an adolescent or adult?: No Was the patient ever a victim of a crime or a disaster?: No Witnessed domestic violence?: No Has patient been affected by domestic violence as an adult?: No  CCA Substance Use Alcohol/Drug Use: Alcohol / Drug Use Pain Medications: See PTA Prescriptions: See PTA Over the Counter: See PTA History of alcohol / drug use?: No history of alcohol / drug abuse Longest period of sobriety (when/how long): n/a   ASAM's:  Six Dimensions of Multidimensional Assessment  Dimension 1:  Acute Intoxication and/or Withdrawal Potential:      Dimension 2:  Biomedical Conditions and Complications:      Dimension 3:  Emotional, Behavioral, or Cognitive Conditions and Complications:     Dimension 4:  Readiness to Change:     Dimension 5:  Relapse, Continued use, or Continued Problem Potential:     Dimension 6:  Recovery/Living Environment:     ASAM Severity Score:    ASAM Recommended Level of Treatment:     Substance use Disorder (SUD)    Recommendations for Services/Supports/Treatments:    Discharge Disposition:    DSM5 Diagnoses: Patient Active Problem List   Diagnosis Date Noted   Severe episode of recurrent major depressive disorder, without psychotic features (HCC) 07/31/2023   At risk for prolonged QT interval syndrome 07/31/2023   Back pain 05/25/2023   Hemorrhoid 05/07/2023   Laryngopharyngeal reflux 10/10/2022   Hair loss 09/22/2022   Nausea 09/22/2022   High risk medication use 08/22/2022   Acid reflux 08/20/2022   Head injury 06/08/2022   Headache 06/08/2022   Drug reaction 06/07/2022   Hypertension 12/01/2020   GAD (generalized anxiety disorder) 01/12/2020   B12 deficiency 12/23/2019   Leukocytosis 12/23/2019   Major depressive disorder, recurrent, severe without psychotic  features (HCC) 12/18/2019   MDD (major depressive disorder), recurrent, in partial remission (HCC) 12/10/2019   Restless leg syndrome 06/02/2019   Colon polyps 01/01/2018   Syncope 12/30/2017   Type 2 diabetes mellitus with hyperglycemia (HCC) 08/26/2015   Cervicalgia 05/30/2015   Healthcare maintenance 02/28/2015   Osteopenia 08/17/2014   Elevated blood pressure reading 06/14/2014   Sinus tachycardia 11/15/2012   Hypercholesterolemia 10/10/2012    Referrals to Alternative Service(s): Referred to Alternative Service(s):   Place:   Date:   Time:    Referred to Alternative Service(s):   Place:   Date:   Time:    Referred to Alternative Service(s):   Place:  Date:   Time:    Referred to Alternative Service(s):   Place:   Date:   Time:     Lilyan Gilford MS, LCAS, [facility], [facility] Therapeutic Triage Specialist 09/21/2023 2:08 PM

## 2023-09-21 NOTE — ED Notes (Signed)
TTS at bedside. 

## 2023-09-21 NOTE — ED Notes (Addendum)
Pt belonging placed in secure area. Bag 1 of 1

## 2023-09-22 DIAGNOSIS — I10 Essential (primary) hypertension: Secondary | ICD-10-CM | POA: Diagnosis not present

## 2023-09-22 DIAGNOSIS — R45851 Suicidal ideations: Secondary | ICD-10-CM | POA: Diagnosis not present

## 2023-09-22 DIAGNOSIS — F411 Generalized anxiety disorder: Secondary | ICD-10-CM | POA: Diagnosis not present

## 2023-09-22 DIAGNOSIS — E119 Type 2 diabetes mellitus without complications: Secondary | ICD-10-CM | POA: Diagnosis not present

## 2023-09-22 DIAGNOSIS — F329 Major depressive disorder, single episode, unspecified: Secondary | ICD-10-CM | POA: Diagnosis not present

## 2023-09-22 DIAGNOSIS — R569 Unspecified convulsions: Secondary | ICD-10-CM | POA: Diagnosis not present

## 2023-09-22 DIAGNOSIS — K219 Gastro-esophageal reflux disease without esophagitis: Secondary | ICD-10-CM | POA: Diagnosis not present

## 2023-09-22 DIAGNOSIS — F32A Depression, unspecified: Secondary | ICD-10-CM | POA: Diagnosis not present

## 2023-09-22 DIAGNOSIS — E785 Hyperlipidemia, unspecified: Secondary | ICD-10-CM | POA: Diagnosis not present

## 2023-09-22 DIAGNOSIS — F332 Major depressive disorder, recurrent severe without psychotic features: Secondary | ICD-10-CM | POA: Diagnosis not present

## 2023-09-22 DIAGNOSIS — Z9151 Personal history of suicidal behavior: Secondary | ICD-10-CM | POA: Diagnosis not present

## 2023-09-22 NOTE — ED Provider Notes (Signed)
-----------------------------------------   10:15 AM on 09/22/2023 -----------------------------------------   Blood pressure 132/70, pulse 70, temperature 98.1 F (36.7 C), temperature source Oral, resp. rate 20, height 5\' 3"  (1.6 m), weight 56.2 kg, last menstrual period 11/27/1979, SpO2 98%.  The patient is calm and cooperative at this time.  There have been no acute events since the last update.  Patient to be transported to [facility] for further care.   Janith Lima, MD 09/22/23 1016

## 2023-09-22 NOTE — ED Notes (Signed)
Made patient aware of bed assignment at [facility]; patient states "I don't want to go that far, it's too far for my husband. What happens if I choose not to go?" Patient educated on importance of placement and the resources [facility] has to offer versus the Emergency Department. Patient wishes to call husband, phone provided to patient.

## 2023-09-22 NOTE — ED Notes (Signed)
Patient states she spoke to her husband on the phone and wishes to make a decision about transport once he arrives.

## 2023-09-22 NOTE — ED Provider Notes (Signed)
Emergency Medicine Observation Re-evaluation Note  Physical Exam   BP 131/82   Pulse 83   Temp 98.2 F (36.8 C)   Resp 16   Ht 5\' 3"  (1.6 m)   Wt 56.2 kg   LMP 11/27/1979   SpO2 94%   BMI 21.97 kg/m   Patient appears in no acute distress.  ED Course / MDM   No reported events during my shift at the time of this note.   Pt is awaiting dispo from consultants   Pilar Jarvis MD    Pilar Jarvis, MD 09/22/23 [phone]

## 2023-09-22 NOTE — ED Notes (Signed)
Husband at bedside requesting to speak with ED physician, Dr. Anner Crete made aware.

## 2023-09-22 NOTE — ED Notes (Signed)
Patient is now agreeable to transport to [facility].

## 2023-09-22 NOTE — ED Notes (Addendum)
EMTALA Reviewed by this RN.

## 2023-09-22 NOTE — ED Notes (Signed)
Vol/pt accepted [facility] 09/22/23 after 7am.

## 2023-09-24 ENCOUNTER — Telehealth: Payer: Self-pay | Admitting: Psychiatry

## 2023-09-24 NOTE — Telephone Encounter (Signed)
Noted  

## 2023-09-24 NOTE — Telephone Encounter (Signed)
Patient spouse came in office to cancel her appointment. She was admitted to [facility] on Saturday, 09/22/23.

## 2023-09-25 ENCOUNTER — Telehealth: Payer: Medicare PPO | Admitting: Psychiatry

## 2023-10-01 ENCOUNTER — Ambulatory Visit: Payer: Medicare PPO

## 2023-10-01 NOTE — Progress Notes (Deleted)
Pt presented for their vitamin B12 injection. Pt was identified through two identifiers. Pt tolerated shot well in their left or right deltoid.  

## 2023-10-02 ENCOUNTER — Ambulatory Visit (INDEPENDENT_AMBULATORY_CARE_PROVIDER_SITE_OTHER): Payer: Medicare PPO | Admitting: Psychiatry

## 2023-10-02 VITALS — BP 168/84 | HR 87 | Temp 97.1°F | Ht 63.0 in | Wt 127.8 lb

## 2023-10-02 DIAGNOSIS — F332 Major depressive disorder, recurrent severe without psychotic features: Secondary | ICD-10-CM

## 2023-10-02 DIAGNOSIS — F411 Generalized anxiety disorder: Secondary | ICD-10-CM

## 2023-10-02 NOTE — Patient Instructions (Addendum)
(  [phone]) [phone] or [email or web address].

## 2023-10-02 NOTE — Progress Notes (Signed)
BH MD OP Progress Note  10/02/2023 3:33 PM Angelica Robertson  MRN:  [id number]  Chief Complaint:  Chief Complaint  Patient presents with   Follow-up   Anxiety   Depression   Medication Refill   HPI: Angelica Robertson is a 77 year old Caucasian female married, retired, lives in [place], has a history of MDD, GAD, hyperlipidemia, arthritis was evaluated in office today.  Patient as well as husband-[name] participated in the evaluation.  Collateral information was obtained from husband.  I have reviewed notes-discharge summary from [facility] dated 09/22/2023 - 09/30/2023.  Patient was continued on medications like Rexulti, Lexapro, mirtazapine dosage was increased to 30 mg.'  Patient today appeared to be calm, dysphoric.  Reports she has started feeling slightly better with regards to her depression.  She does not wake up feeling shaky or anxious like she did previously anymore.  So far she is tolerating the medication dosages well.  Patient reports her sleep has improved at night.  According to spouse patient was tired when she got back home on Sunday and slept during the day.  Last night she slept around 7 to 8 hours.  Patient continues to not participate in social activities at this time.  She however reports it as too soon, since she just got back home on Sunday.  She reports she will be motivated to start working on getting out, probably participating in Bible study or church or going to gym.  Patient currently denies any suicidality, homicidality or perceptual disturbances.  Patient appeared to be alert, oriented to person place time situation.  3 word memory immediate 3 out of 3, after 5 minutes 3 out of 3.  Patient was able to do serial sevens well.  Patient as well as spouse interested in referral for ketamine nasal spray.  Patient has upcoming appointment with therapist and is motivated to stay in therapy.  Denies any other concerns today.  Visit Diagnosis:    ICD-10-CM    1. Major depressive disorder, recurrent, severe without psychotic features (HCC)  F33.2     2. GAD (generalized anxiety disorder)  F41.1       Past Psychiatric History: I have reviewed past psychiatric history from progress note on 01/12/2020.  Previously attempted TMS-07/17/2022-did not tolerate it due to side effects of headaches. Past trials of medications-duloxetine, Zoloft, Wellbutrin, Klonopin, trazodone, zolpidem, Seroquel. Recent inpatient behavioral health admission-[facility]-09/22/2023 - 09/30/2023.  Past Medical History:  Past Medical History:  Diagnosis Date   Depression 2000   Heart murmur    Hypercholesterolemia Osteoporosis   Hyperlipemia    Hypertension     Past Surgical History:  Procedure Laterality Date   ABDOMINAL HYSTERECTOMY  1980   COLONOSCOPY WITH PROPOFOL N/A 03/05/2018   Procedure: COLONOSCOPY WITH PROPOFOL;  Surgeon: Toledo, Boykin Nearing, MD;  Location: [facility] ENDOSCOPY;  Service: Endoscopy;  Laterality: N/A;   COLONOSCOPY WITH PROPOFOL N/A 04/23/2023   Procedure: COLONOSCOPY WITH PROPOFOL;  Surgeon: Regis Bill, MD;  Location: [facility] ENDOSCOPY;  Service: Endoscopy;  Laterality: N/A;    Family Psychiatric History: Reviewed family psychiatric history from progress note on 01/18/2020.  Family History:  Family History  Problem Relation Age of Onset   Heart disease Mother    Diabetes Mother    Heart disease Father    Diabetes Father    Colon cancer Other    Congestive Heart Failure Brother    Colon cancer Brother    Mental illness Paternal Aunt    BRCA  1/2 Neg Hx    Breast cancer Neg Hx    Cowden syndrome Neg Hx    DES usage Neg Hx    Endometrial cancer Neg Hx    Li-Fraumeni syndrome Neg Hx    Ovarian cancer Neg Hx     Social History: I have reviewed social history from progress note on 01/12/2020. Social History   Socioeconomic History   Marital status: Married    Spouse name: robert   Number of children: 2   Years of education:  Not on file   Highest education level: 12th grade  Occupational History   Not on file  Tobacco Use   Smoking status: Never   Smokeless tobacco: Never  Vaping Use   Vaping status: Never Used  Substance and Sexual Activity   Alcohol use: Yes    Alcohol/week: 0.0 standard drinks of alcohol    Comment: occasionally - socially during the summer a glass of wine or beer   Drug use: No   Sexual activity: Yes  Other Topics Concern   Not on file  Social History Narrative   Not on file   Social Determinants of Health   Financial Resource Strain: Low Risk  (10/02/2023)   Overall Financial Resource Strain (CARDIA)    Difficulty of Paying Living Expenses: Not hard at all  Food Insecurity: No Food Insecurity (10/02/2023)   Hunger Vital Sign    Worried About Running Out of Food in the Last Year: Never true    Ran Out of Food in the Last Year: Never true  Transportation Needs: No Transportation Needs (10/02/2023)   PRAPARE - Administrator, Civil Service (Medical): No    Lack of Transportation (Non-Medical): No  Physical Activity: Inactive (10/02/2023)   Exercise Vital Sign    Days of Exercise per Week: 0 days    Minutes of Exercise per Session: 30 min  Stress: Stress Concern Present (10/02/2023)   [employer] of Occupational Health - Occupational Stress Questionnaire    Feeling of Stress : To some extent  Social Connections: Socially Integrated (10/02/2023)   Social Connection and Isolation Panel [NHANES]    Frequency of Communication with Friends and Family: Twice a week    Frequency of Social Gatherings with Friends and Family: Once a week    Attends Religious Services: More than 4 times per year    Active Member of Clubs or Organizations: Yes    Attends Engineer, structural: More than 4 times per year    Marital Status: Married    Allergies: No Known Allergies  Metabolic Disorder Labs: Lab Results  Component Value Date   HGBA1C 6.0 (H) 08/10/2023    No results found for: "PROLACTIN" Lab Results  Component Value Date   CHOL 118 08/10/2023   TRIG 60 08/10/2023   HDL 67 08/10/2023   CHOLHDL 1.8 08/10/2023   VLDL 16.1 04/25/2023   LDLCALC 38 08/10/2023   LDLCALC 42 04/25/2023   Lab Results  Component Value Date   TSH 1.860 08/10/2023   TSH 1.20 09/22/2022    Therapeutic Level Labs: No results found for: "LITHIUM" No results found for: "VALPROATE" No results found for: "CBMZ"  Current Medications: Current Outpatient Medications  Medication Sig Dispense Refill   amLODipine (NORVASC) 2.5 MG tablet Take 1 tablet (2.5 mg total) by mouth daily. In am 90 tablet 3   Brexpiprazole (REXULTI) 0.5 MG TABS Take 1 tablet (0.5 mg total) by mouth daily. Dose change as requested 30 tablet  0   calcium carbonate (OS-CAL) 600 MG TABS tablet Take 600 mg by mouth 2 (two) times daily with a meal.     escitalopram (LEXAPRO) 20 MG tablet TAKE 1/2 TABLET BY MOUTH TWICE DAILY 30 tablet 1   losartan (COZAAR) 25 MG tablet TAKE 1 TABLET BY MOUTH ONCE DAILY (Patient taking differently: Take 25 mg by mouth at bedtime.) 90 tablet 1   mirtazapine (REMERON) 15 MG tablet Take 1.5 tablets (22.5 mg total) by mouth at bedtime. (Patient taking differently: Take 30 mg by mouth at bedtime.) 135 tablet 1   pantoprazole (PROTONIX) 20 MG tablet Take 1 tablet (20 mg total) by mouth daily. 90 tablet 1   rosuvastatin (CRESTOR) 20 MG tablet Take 1 tablet (20 mg total) by mouth at bedtime. 90 tablet 3   VITAMIN D PO Take by mouth daily.     No current facility-administered medications for this visit.     Musculoskeletal: Strength & Muscle Tone: within normal limits Gait & Station: normal Patient leans: N/A  Psychiatric Specialty Exam: Review of Systems  Psychiatric/Behavioral:  Positive for dysphoric mood. The patient is nervous/anxious.     Blood pressure (!) 168/84, pulse 87, temperature (!) 97.1 F (36.2 C), temperature source Skin, height 5\' 3"  (1.6 m),  weight 127 lb 12.8 oz (58 kg), last menstrual period 11/27/1979.Body mass index is 22.64 kg/m.  General Appearance: Fairly Groomed  Eye Contact:  Good  Speech:  Clear and Coherent  Volume:  Normal  Mood:  Anxious and Dysphoric improving  Affect:  Flat  Thought Process:  Goal Directed and Descriptions of Associations: Intact  Orientation:  Full (Time, Place, and Person)  Thought Content: Logical   Suicidal Thoughts:  No  Homicidal Thoughts:  No  Memory:  Immediate;   Fair Recent;   Fair Remote;   Fair  Judgement:  Fair  Insight:  Fair  Psychomotor Activity:  Normal  Concentration:  Concentration: Fair and Attention Span: Fair  Recall:  [employer] of Knowledge: Fair  Language: Fair  Akathisia:  No  Handed:  Right  AIMS (if indicated): done  Assets:  Communication Skills Desire for Improvement Housing Social Support  ADL's:  Intact  Cognition: WNL  Sleep:   Improving   Screenings: AIMS    Flowsheet Row Office Visit from 10/02/2023 in [facility] Office Visit from 08/28/2023 in [facility] Office Visit from 06/20/2022 in [facility] Office Visit from 04/24/2022 in [facility]  AIMS Total Score 0 0 0 0      AUDIT    Flowsheet Row Admission (Discharged) from 05/12/2022 in [facility] Admission (Discharged) from 12/17/2019 in [facility]IORAL MEDICINE Admission (Discharged) from [facility]  Alcohol Use Disorder Identification Test Final Score (AUDIT) 0 0 0      GAD-7    Flowsheet Row Office Visit from 10/02/2023 in [facility] Office Visit from 09/04/2023 in [facility] [employer] Visit from 08/28/2023 in [facility] Office Visit from 07/31/2023 in [facility] Office Visit from 11/15/2022 in [facility]  Total GAD-7 Score 9 12 16 15 1       Mini-Mental    Flowsheet Row Clinical Support from 02/27/2018 in [facility] [place]  [facility] Clinical Support from 02/02/2017 in [facility] [employer] from 02/03/2016 in [facility] [employer]  Total Score (max 30 points ) 30 30 30       PHQ2-9    Flowsheet Row Office Visit from 10/02/2023 in [facility] Office Visit from 09/04/2023 in [facility] Visit from 08/28/2023 in [facility] Counselor from 08/22/2023 in BEHAVIORAL HEALTH PARTIAL HOSPITALIZATION PROGRAM Office Visit from 08/14/2023 in [facility] Total Score 4 4 6 4 6   PHQ-9 Total Score 12 14 22 17 22       Flowsheet Row Office Visit from 10/02/2023 in [facility] ED from 09/21/2023 in [facility] at [facility] Visit from 08/28/2023 in [facility]  C-SSRS RISK CATEGORY Low Risk Low Risk No Risk        Assessment and Plan: Angelica Robertson is a 77 year old Caucasian female, married, retired, lives in [place], has a history of MDD, anxiety, hyperlipidemia, was evaluated in office today.  Patient with recent inpatient behavioral health admission dated 09/22/2023 - 09/30/2023, at old [facility], currently improving with regards to her mood symptoms however will continue to need medication management, psychotherapy sessions, plan as noted below.  Plan MDD-improving Continue mirtazapine 30 mg p.o. nightly Lexapro 20 mg p.o. daily Rexulti  0.5 mg p.o. daily I have referred patient for ketamine nasal spray at [facility] a consultation-also provided the phone number for patient to reach out to them as well.  GAD-improving Mirtazapine 30 mg p.o. nightly Lexapro 20 mg p.o. daily Patient to continue follow-up with therapist-Ms. Victorino Dike.  I have reviewed discharge summary from old [facility]. [facility]-09/22/2023 - 09/30/2023-patient with MDD, GAD-discharged on mirtazapine 30 mg, Lexapro 20 mg, Rexulti 0.5 mg daily.  I have obtained collateral information from spouse as noted above.  Have reviewed notes from the emergency department-09/21/2023-Ms.Rayburn Go -recommended inpatient psychiatric admission.   Collaboration of Care: Collaboration of Care: Other as noted above reviewed notes as well as encourage patient to follow up with therapist.  Patient/Guardian was advised Release of Information must be obtained prior to any record release in order to collaborate their care with an outside provider. Patient/Guardian was advised if they have not already done so to contact the registration department to sign all necessary forms in order for [place] to release information regarding their care.   Consent: Patient/Guardian gives verbal consent for treatment and assignment of benefits for services provided during this visit. Patient/Guardian expressed understanding and agreed to proceed.   I have spent atleast 40 minutes face to face with patient today which includes the time spent for preparing to see the patient ( e.g., review of test, records ), obtaining and to review and separately obtained history , ordering medications ,psychoeducation and supportive psychotherapy and care coordination,as well as documenting clinical information in electronic health record.  This note was generated in part or whole with voice recognition software. Voice recognition is usually quite accurate but there are transcription errors that can and  very often do occur. I apologize for any typographical errors that were not detected and corrected.     Jomarie Longs, MD 10/02/2023, 3:33 PM

## 2023-10-03 ENCOUNTER — Ambulatory Visit: Payer: Medicare PPO | Admitting: Internal Medicine

## 2023-10-03 ENCOUNTER — Encounter: Payer: Self-pay | Admitting: Internal Medicine

## 2023-10-03 VITALS — BP 130/74 | HR 83 | Temp 98.2°F | Resp 16 | Ht 63.0 in | Wt 126.4 lb

## 2023-10-03 DIAGNOSIS — R519 Headache, unspecified: Secondary | ICD-10-CM | POA: Diagnosis not present

## 2023-10-03 DIAGNOSIS — F332 Major depressive disorder, recurrent severe without psychotic features: Secondary | ICD-10-CM | POA: Diagnosis not present

## 2023-10-03 DIAGNOSIS — M542 Cervicalgia: Secondary | ICD-10-CM

## 2023-10-03 DIAGNOSIS — E1165 Type 2 diabetes mellitus with hyperglycemia: Secondary | ICD-10-CM

## 2023-10-03 DIAGNOSIS — I10 Essential (primary) hypertension: Secondary | ICD-10-CM | POA: Diagnosis not present

## 2023-10-03 DIAGNOSIS — E7800 Pure hypercholesterolemia, unspecified: Secondary | ICD-10-CM | POA: Diagnosis not present

## 2023-10-03 NOTE — Patient Instructions (Signed)
Magnesium glycinate - one tablet daily

## 2023-10-03 NOTE — Progress Notes (Signed)
Subjective:    Patient ID: Angelica Robertson, female    DOB: 07-14-1946, 77 y.o.   MRN: [id number]  Patient here for  Chief Complaint  Patient presents with   Medical Management of Chronic Issues    HPI Here for f/u appt.  F/u regarding anxiety and depression, as well as her blood pressure.  Continues on losartan and amlodipine. Recent psychiatric admission - 09/22/23 - 09/30/23. Currently on rexulti, lexapro and mirtazapine - dosage increased to 30mg . Had f/u with Dr Elna Breslow yesterday.  Recommended ketamine nasal spray. Referral placed.  She is accompanied by her husband today.  History obtained from both of them. Reports she is doing some better. No SI.  Is having persistent issues with headaches.  Takes tylenol.  Helps. Her neck is doing some better.    Past Medical History:  Diagnosis Date   Depression 2000   Heart murmur    Hypercholesterolemia Osteoporosis   Hyperlipemia    Hypertension    Past Surgical History:  Procedure Laterality Date   ABDOMINAL HYSTERECTOMY  1980   COLONOSCOPY WITH PROPOFOL N/A 03/05/2018   Procedure: COLONOSCOPY WITH PROPOFOL;  Surgeon: Toledo, Boykin Nearing, MD;  Location: [facility] ENDOSCOPY;  Service: Endoscopy;  Laterality: N/A;   COLONOSCOPY WITH PROPOFOL N/A 04/23/2023   Procedure: COLONOSCOPY WITH PROPOFOL;  Surgeon: Regis Bill, MD;  Location: [facility] ENDOSCOPY;  Service: Endoscopy;  Laterality: N/A;   Family History  Problem Relation Age of Onset   Heart disease Mother    Diabetes Mother    Heart disease Father    Diabetes Father    Colon cancer Other    Congestive Heart Failure Brother    Colon cancer Brother    Mental illness Paternal Aunt    BRCA 1/2 Neg Hx    Breast cancer Neg Hx    Cowden syndrome Neg Hx    DES usage Neg Hx    Endometrial cancer Neg Hx    Li-Fraumeni syndrome Neg Hx    Ovarian cancer Neg Hx    Social History   Socioeconomic History   Marital status: Married    Spouse name: robert   Number of children: 2   Years  of education: Not on file   Highest education level: 12th grade  Occupational History   Not on file  Tobacco Use   Smoking status: Never   Smokeless tobacco: Never  Vaping Use   Vaping status: Never Used  Substance and Sexual Activity   Alcohol use: Yes    Alcohol/week: 0.0 standard drinks of alcohol    Comment: occasionally - socially during the summer a glass of wine or beer   Drug use: No   Sexual activity: Yes  Other Topics Concern   Not on file  Social History Narrative   Not on file   Social Determinants of Health   Financial Resource Strain: Low Risk  (10/02/2023)   Overall Financial Resource Strain (CARDIA)    Difficulty of Paying Living Expenses: Not hard at all  Food Insecurity: No Food Insecurity (10/02/2023)   Hunger Vital Sign    Worried About Running Out of Food in the Last Year: Never true    Ran Out of Food in the Last Year: Never true  Transportation Needs: No Transportation Needs (10/02/2023)   PRAPARE - Administrator, Civil Service (Medical): No    Lack of Transportation (Non-Medical): No  Physical Activity: Inactive (10/02/2023)   Exercise Vital Sign    Days of Exercise  per Week: 0 days    Minutes of Exercise per Session: 30 min  Stress: Stress Concern Present (10/02/2023)   [employer] of Occupational Health - Occupational Stress Questionnaire    Feeling of Stress : To some extent  Social Connections: Socially Integrated (10/02/2023)   Social Connection and Isolation Panel [NHANES]    Frequency of Communication with Friends and Family: Twice a week    Frequency of Social Gatherings with Friends and Family: Once a week    Attends Religious Services: More than 4 times per year    Active Member of [employer] or Organizations: Yes    Attends Engineer, structural: More than 4 times per year    Marital Status: Married     Review of Systems  Constitutional:  Negative for appetite change and unexpected weight change.  HENT:   Negative for congestion and sinus pressure.   Respiratory:  Negative for cough, chest tightness and shortness of breath.   Cardiovascular:  Negative for chest pain and palpitations.  Gastrointestinal:  Negative for abdominal pain, diarrhea, nausea and vomiting.  Genitourinary:  Negative for difficulty urinating and dysuria.  Musculoskeletal:  Positive for neck pain. Negative for joint swelling and myalgias.  Skin:  Negative for color change and rash.  Neurological:  Positive for headaches. Negative for dizziness.  Psychiatric/Behavioral:  Negative for agitation and dysphoric mood.        Objective:     BP 130/74   Pulse 83   Temp 98.2 F (36.8 C)   Resp 16   Ht 5\' 3"  (1.6 m)   Wt 126 lb 6.4 oz (57.3 kg)   LMP 11/27/1979   SpO2 98%   BMI 22.39 kg/m  Wt Readings from Last 3 Encounters:  10/03/23 126 lb 6.4 oz (57.3 kg)  09/21/23 124 lb (56.2 kg)  09/04/23 125 lb 9.6 oz (57 kg)    Physical Exam Vitals reviewed.  Constitutional:      General: She is not in acute distress.    Appearance: Normal appearance.  HENT:     Head: Normocephalic and atraumatic.     Right Ear: External ear normal.     Left Ear: External ear normal.  Eyes:     General: No scleral icterus.       Right eye: No discharge.        Left eye: No discharge.     Conjunctiva/sclera: Conjunctivae normal.  Neck:     Thyroid: No thyromegaly.  Cardiovascular:     Rate and Rhythm: Normal rate and regular rhythm.  Pulmonary:     Effort: No respiratory distress.     Breath sounds: Normal breath sounds. No wheezing.  Abdominal:     General: Bowel sounds are normal.     Palpations: Abdomen is soft.     Tenderness: There is no abdominal tenderness.  Musculoskeletal:        General: No swelling or tenderness.     Cervical back: Neck supple. No tenderness.  Lymphadenopathy:     Cervical: No cervical adenopathy.  Skin:    Findings: No erythema or rash.  Neurological:     Mental Status: She is alert.   Psychiatric:        Mood and Affect: Mood normal.        Behavior: Behavior normal.      Outpatient Encounter Medications as of 10/03/2023  Medication Sig   amLODipine (NORVASC) 2.5 MG tablet Take 1 tablet (2.5 mg total) by mouth daily. In am  Brexpiprazole (REXULTI) 0.5 MG TABS Take 1 tablet (0.5 mg total) by mouth daily. Dose change as requested   calcium carbonate (OS-CAL) 600 MG TABS tablet Take 600 mg by mouth 2 (two) times daily with a meal.   escitalopram (LEXAPRO) 20 MG tablet TAKE 1/2 TABLET BY MOUTH TWICE DAILY   losartan (COZAAR) 25 MG tablet TAKE 1 TABLET BY MOUTH ONCE DAILY (Patient taking differently: Take 25 mg by mouth at bedtime.)   mirtazapine (REMERON) 15 MG tablet Take 1.5 tablets (22.5 mg total) by mouth at bedtime. (Patient taking differently: Take 30 mg by mouth at bedtime.)   pantoprazole (PROTONIX) 20 MG tablet Take 1 tablet (20 mg total) by mouth daily.   rosuvastatin (CRESTOR) 20 MG tablet Take 1 tablet (20 mg total) by mouth at bedtime.   VITAMIN D PO Take by mouth daily.   No facility-administered encounter medications on file as of 10/03/2023.     Lab Results  Component Value Date   WBC 10.8 (H) 09/21/2023   HGB 13.5 09/21/2023   HCT 41.0 09/21/2023   PLT 336 09/21/2023   GLUCOSE 132 (H) 09/21/2023   CHOL 118 08/10/2023   TRIG 60 08/10/2023   HDL 67 08/10/2023   LDLCALC 38 08/10/2023   ALT 25 09/21/2023   AST 26 09/21/2023   NA 136 09/21/2023   K 4.1 09/21/2023   CL 98 09/21/2023   CREATININE 0.69 09/21/2023   BUN 15 09/21/2023   CO2 28 09/21/2023   TSH 1.860 08/10/2023   HGBA1C 6.0 (H) 08/10/2023    CT HEAD WO CONTRAST ( )  Result Date: 09/21/2023 CLINICAL DATA:  Headache. Complains of depression. Alcohol and substance abuse. EXAM: CT HEAD WITHOUT CONTRAST TECHNIQUE: Contiguous axial images were obtained from the base of the skull through the vertex without intravenous contrast. RADIATION DOSE REDUCTION: This exam was performed  according to the departmental dose-optimization program which includes automated exposure control, adjustment of the mA and/or kV according to patient size and/or use of iterative reconstruction technique. COMPARISON:  06/08/2022 FINDINGS: Brain: No evidence of acute infarction, hemorrhage, hydrocephalus, extra-axial collection or mass lesion/mass effect. There is mild patchy low-attenuation within the subcortical and periventricular white matter compatible with chronic microvascular disease. Vascular: No hyperdense vessel or unexpected calcification. Skull: Normal. Negative for fracture or focal lesion. Sinuses/Orbits: No acute finding. Other: None. IMPRESSION: 1. No acute intracranial abnormalities. 2. Chronic microvascular disease. Electronically Signed   By: Signa Kell M.D.   On: 09/21/2023 12:52       Assessment & Plan:  Type 2 diabetes mellitus with hyperglycemia, without long-term current use of insulin (HCC) Assessment & Plan: Follow met b and A1c.  Lab Results  Component Value Date   HGBA1C 6.0 (H) 08/10/2023      Major depressive disorder, recurrent, severe without psychotic features [facility]) Assessment & Plan: Followed by psych. Had f/u yesterday. Currently on rexulti, lexapro and mirtazapine - dosage increased to 30mg . Referred for ketamine nasal spry consultation. She reports she may be feeling some better.  No SI.  Seeing a therapist.    Hypertension, unspecified type Assessment & Plan: Continue losartan and amlodipine. Pressures as outlined.  No change in medication.  Follow pressures.  Follow metabolic panel.    Hypercholesterolemia Assessment & Plan: On crestor.  Follow lipid panel and liver function tests.     Cervicalgia Assessment & Plan: Has had a problem with chronic neck issues.  Has been to PT. Neck pain is better.  Follow.  Acute nonintractable headache, unspecified headache type Assessment & Plan: Previously saw neurology.  Recommended magnesium.  Was  questioning if her neck issues were contributing. Neck is better. Persistent issues with headaches.  Plan f/u with neurology.       Dale Applegate, MD

## 2023-10-07 ENCOUNTER — Encounter: Payer: Self-pay | Admitting: Internal Medicine

## 2023-10-07 NOTE — Assessment & Plan Note (Signed)
On crestor.  Follow lipid panel and liver function tests.   

## 2023-10-07 NOTE — Assessment & Plan Note (Signed)
Has had a problem with chronic neck issues.  Has been to PT. Neck pain is better.  Follow.

## 2023-10-07 NOTE — Assessment & Plan Note (Signed)
Previously saw neurology.  Recommended magnesium.  Was questioning if her neck issues were contributing. Neck is better. Persistent issues with headaches.  Plan f/u with neurology.

## 2023-10-07 NOTE — Assessment & Plan Note (Signed)
Follow met b and A1c.  Lab Results  Component Value Date   HGBA1C 6.0 (H) 08/10/2023

## 2023-10-07 NOTE — Assessment & Plan Note (Signed)
Followed by psych. Had f/u yesterday. Currently on rexulti, lexapro and mirtazapine - dosage increased to 30mg . Referred for ketamine nasal spry consultation. She reports she may be feeling some better.  No SI.  Seeing a therapist.

## 2023-10-07 NOTE — Assessment & Plan Note (Signed)
Continue losartan and amlodipine. Pressures as outlined.  No change in medication.  Follow pressures.  Follow metabolic panel.  

## 2023-10-11 DIAGNOSIS — F331 Major depressive disorder, recurrent, moderate: Secondary | ICD-10-CM | POA: Diagnosis not present

## 2023-10-11 DIAGNOSIS — F4323 Adjustment disorder with mixed anxiety and depressed mood: Secondary | ICD-10-CM | POA: Diagnosis not present

## 2023-10-17 DIAGNOSIS — F332 Major depressive disorder, recurrent severe without psychotic features: Secondary | ICD-10-CM | POA: Diagnosis not present

## 2023-10-19 ENCOUNTER — Telehealth (INDEPENDENT_AMBULATORY_CARE_PROVIDER_SITE_OTHER): Payer: Medicare PPO | Admitting: Psychiatry

## 2023-10-19 ENCOUNTER — Encounter: Payer: Self-pay | Admitting: Psychiatry

## 2023-10-19 DIAGNOSIS — Z9189 Other specified personal risk factors, not elsewhere classified: Secondary | ICD-10-CM | POA: Diagnosis not present

## 2023-10-19 DIAGNOSIS — F3341 Major depressive disorder, recurrent, in partial remission: Secondary | ICD-10-CM

## 2023-10-19 DIAGNOSIS — F411 Generalized anxiety disorder: Secondary | ICD-10-CM

## 2023-10-19 DIAGNOSIS — Z79899 Other long term (current) drug therapy: Secondary | ICD-10-CM

## 2023-10-19 MED ORDER — MIRTAZAPINE 30 MG PO TABS
30.0000 mg | ORAL_TABLET | Freq: Every day | ORAL | Status: DC
Start: 2023-10-19 — End: 2023-11-06

## 2023-10-19 MED ORDER — REXULTI 0.5 MG PO TABS
0.5000 mg | ORAL_TABLET | Freq: Every day | ORAL | 2 refills | Status: DC
Start: 2023-10-19 — End: 2023-11-16

## 2023-10-19 NOTE — Progress Notes (Signed)
Virtual Visit via Video Note  I connected with Angelica Robertson on 10/19/23 at 11:30 AM EST by a video enabled telemedicine application and verified that I am speaking with the correct person using two identifiers.  Location Provider Location : [facility] Patient Location : Home  Participants: Patient , Provider    I discussed the limitations of evaluation and management by telemedicine and the availability of in person appointments. The patient expressed understanding and agreed to proceed.   I discussed the assessment and treatment plan with the patient. The patient was provided an opportunity to ask questions and all were answered. The patient agreed with the plan and demonstrated an understanding of the instructions.   The patient was advised to call back or seek an in-person evaluation if the symptoms worsen or if the condition fails to improve as anticipated.   BH MD OP Progress Note  10/19/2023 12:26 PM Angelica Robertson  MRN:  [id number]  Chief Complaint:  Chief Complaint  Patient presents with   Follow-up   Depression   Anxiety   Medication Refill   HPI: Angelica Robertson is a 77 year old Caucasian female, married, retired, lives in [place], has a history of MDD, GAD, hyperlipidemia, arthritis was evaluated by telemedicine today.  Patient today reports she is currently improving on the current medication regimen.  She has noticed good benefits although mild.  She reports she has started going to church and was able to go for a couple of Bible study groups.  These are all good changes.  Patient reports she continues to be a worrier however has started talking to her therapist again.  She is motivated to stay in therapy.  Patient reports sleep has improved.  She reports appetite is good.  She reports she was able to follow up with [facility] , she was referred there for ketamine therapy consultation.  Patient reports they are currently trying to work with her insurance to get it approved.   She however is not sure if she wants to pursue that treatment or not.  She is currently compliant on the Rexulti, mirtazapine, Lexapro.  Denies side effects.  Patient denies suicidality, homicidality or perceptual disturbances.  Patient denies any other concerns today.  Visit Diagnosis:    ICD-10-CM   1. Recurrent major depressive disorder, in partial remission (HCC)  F33.41 Brexpiprazole (REXULTI) 0.5 MG TABS    2. GAD (generalized anxiety disorder)  F41.1 Platelet count    Brexpiprazole (REXULTI) 0.5 MG TABS    mirtazapine (REMERON) 30 MG tablet    3. At risk for prolonged QT interval syndrome  Z91.89 EKG 12-Lead    4. High risk medication use  Z79.899 Sodium    Platelet count      Past Psychiatric History: I have reviewed past psychiatric history from progress note on 01/12/2020. Previously attempted TMS-07/17/2022-did not tolerate it. Past trials of medications-duloxetine, Zoloft, Wellbutrin, Klonopin, trazodone, zolpidem, Seroquel. Inpatient behavioral health admissions-most recent - [facility] - 10/12-10/20/24  Past Medical History:  Past Medical History:  Diagnosis Date   Depression 2000   Heart murmur    Hypercholesterolemia Osteoporosis   Hyperlipemia    Hypertension     Past Surgical History:  Procedure Laterality Date   ABDOMINAL HYSTERECTOMY  1980   COLONOSCOPY WITH PROPOFOL N/A 03/05/2018   Procedure: COLONOSCOPY WITH PROPOFOL;  Surgeon: Toledo, Boykin Nearing, MD;  Location: [facility] ENDOSCOPY;  Service: Endoscopy;  Laterality: N/A;   COLONOSCOPY WITH PROPOFOL N/A 04/23/2023   Procedure: COLONOSCOPY  WITH PROPOFOL;  Surgeon: Regis Bill, MD;  Location: [facility] ENDOSCOPY;  Service: Endoscopy;  Laterality: N/A;    Family Psychiatric History: I have reviewed family psychiatric history from progress note on 01/18/2020.  Family History:  Family History  Problem Relation Age of Onset   Heart disease Mother    Diabetes Mother    Heart disease Father     Diabetes Father    Colon cancer Other    Congestive Heart Failure Brother    Colon cancer Brother    Mental illness Paternal Aunt    BRCA 1/2 Neg Hx    Breast cancer Neg Hx    Cowden syndrome Neg Hx    DES usage Neg Hx    Endometrial cancer Neg Hx    Li-Fraumeni syndrome Neg Hx    Ovarian cancer Neg Hx     Social History: I have reviewed social history from progress note on 01/18/2020. Social History   Socioeconomic History   Marital status: Married    Spouse name: robert   Number of children: 2   Years of education: Not on file   Highest education level: 12th grade  Occupational History   Not on file  Tobacco Use   Smoking status: Never   Smokeless tobacco: Never  Vaping Use   Vaping status: Never Used  Substance and Sexual Activity   Alcohol use: Yes    Alcohol/week: 0.0 standard drinks of alcohol    Comment: occasionally - socially during the summer a glass of wine or beer   Drug use: No   Sexual activity: Yes  Other Topics Concern   Not on file  Social History Narrative   Not on file   Social Determinants of Health   Financial Resource Strain: Low Risk  (10/02/2023)   Overall Financial Resource Strain (CARDIA)    Difficulty of Paying Living Expenses: Not hard at all  Food Insecurity: No Food Insecurity (10/02/2023)   Hunger Vital Sign    Worried About Running Out of Food in the Last Year: Never true    Ran Out of Food in the Last Year: Never true  Transportation Needs: No Transportation Needs (10/02/2023)   PRAPARE - Administrator, Civil Service (Medical): No    Lack of Transportation (Non-Medical): No  Physical Activity: Inactive (10/02/2023)   Exercise Vital Sign    Days of Exercise per Week: 0 days    Minutes of Exercise per Session: 30 min  Stress: Stress Concern Present (10/02/2023)   [employer] of Occupational Health - Occupational Stress Questionnaire    Feeling of Stress : To some extent  Social Connections: Socially Integrated  (10/02/2023)   Social Connection and Isolation Panel [NHANES]    Frequency of Communication with Friends and Family: Twice a week    Frequency of Social Gatherings with Friends and Family: Once a week    Attends Religious Services: More than 4 times per year    Active Member of Clubs or Organizations: Yes    Attends Banker Meetings: More than 4 times per year    Marital Status: Married    Allergies: No Known Allergies  Metabolic Disorder Labs: Lab Results  Component Value Date   HGBA1C 6.0 (H) 08/10/2023   No results found for: "PROLACTIN" Lab Results  Component Value Date   CHOL 118 08/10/2023   TRIG 60 08/10/2023   HDL 67 08/10/2023   CHOLHDL 1.8 08/10/2023   VLDL 40.9 04/25/2023   LDLCALC 38 08/10/2023  LDLCALC 42 04/25/2023   Lab Results  Component Value Date   TSH 1.860 08/10/2023   TSH 1.20 09/22/2022    Therapeutic Level Labs: No results found for: "LITHIUM" No results found for: "VALPROATE" No results found for: "CBMZ"  Current Medications: Current Outpatient Medications  Medication Sig Dispense Refill   amLODipine (NORVASC) 2.5 MG tablet Take 1 tablet (2.5 mg total) by mouth daily. In am 90 tablet 3   calcium carbonate (OS-CAL) 600 MG TABS tablet Take 600 mg by mouth 2 (two) times daily with a meal.     escitalopram (LEXAPRO) 20 MG tablet TAKE 1/2 TABLET BY MOUTH TWICE DAILY 30 tablet 1   losartan (COZAAR) 25 MG tablet TAKE 1 TABLET BY MOUTH ONCE DAILY (Patient taking differently: Take 25 mg by mouth at bedtime.) 90 tablet 1   mirtazapine (REMERON) 30 MG tablet Take 1 tablet (30 mg total) by mouth at bedtime.     pantoprazole (PROTONIX) 20 MG tablet Take 1 tablet (20 mg total) by mouth daily. 90 tablet 1   rosuvastatin (CRESTOR) 20 MG tablet Take 1 tablet (20 mg total) by mouth at bedtime. 90 tablet 3   VITAMIN D PO Take by mouth daily.     Brexpiprazole (REXULTI) 0.5 MG TABS Take 1 tablet (0.5 mg total) by mouth daily. 30 tablet 2   No  current facility-administered medications for this visit.     Musculoskeletal: Strength & Muscle Tone:  UTA Gait & Station:  Seated Patient leans: N/A  Psychiatric Specialty Exam: Review of Systems  Psychiatric/Behavioral:  Positive for dysphoric mood. The patient is nervous/anxious.     Last menstrual period 11/27/1979.There is no height or weight on file to calculate BMI.  General Appearance: Guarded  Eye Contact:  Fair  Speech:  Clear and Coherent  Volume:  Normal  Mood:  Anxious and Dysphoric  Affect:  Congruent  Thought Process:  Goal Directed and Descriptions of Associations: Intact  Orientation:  Other:  Self, situation  Thought Content: Logical   Suicidal Thoughts:  No  Homicidal Thoughts:  No  Memory:  Immediate;   Fair  Judgement:  Fair  Insight:  Fair  Psychomotor Activity:  Normal  Concentration:  Concentration: Fair and Attention Span: Fair  Recall:  [employer] of Knowledge: Fair  Language: Fair  Akathisia:  No  Handed:  Right  AIMS (if indicated): not done  Assets:  Desire for Improvement Housing Intimacy Social Support Transportation  ADL's:  Intact  Cognition: WNL  Sleep:  Fair   Screenings: AIMS    Flowsheet Row Office Visit from 10/02/2023 in [facility] Office Visit from 08/28/2023 in [facility] Office Visit from 06/20/2022 in [facility] Office Visit from 04/24/2022 in [facility]  AIMS Total Score 0 0 0 0      AUDIT    Flowsheet Row Admission (Discharged) from 05/12/2022 in [facility] BE[facility]IORAL [facility]INE Admission (Discharged) from 12/17/2019 in [facility] INPATIENT BE[facility]AV[facility]AL MEDICINE Admission (Discharged) from [facility]  Alcohol Use Disorder Identification Test Final Score (AUDIT) 0 0 0      GAD-7    Flowsheet Row  Video Visit from 10/19/2023 in [facility] Office Visit from 10/02/2023 in [facility] Office Visit from 09/04/2023 in [facility] Visit from 08/28/2023 in  [facility] Office Visit from 07/31/2023 in [facility]  Total GAD-7 Score 9 9 12 16 15       Mini-Mental    Flowsheet Row Clinical Support from 02/27/2018 in [facility] Clinical Support from 02/02/2017 in [facility] Clinical Support from 02/03/2016 in [facility]  Total Score (max 30 points ) 30 30 30       PHQ2-9    Flowsheet Row Video Visit from 10/19/2023 in [facility] Office Visit from 10/02/2023 in [facility] Office Visit from 09/04/2023 in [facility] at [facility] Visit from 08/28/2023 in [facility] Counselor from 08/22/2023 in BEHAVIORAL HEALTH PARTIAL HOSPITALIZATION PROGRAM  PHQ-2 Total Score 2 4 4 6 4   PHQ-9 Total Score 8 12 14 22 17       Flowsheet Row Video Visit from 10/19/2023 in [facility] Office Visit from 10/02/2023 in [facility] ED from 09/21/2023 in [facility] at [facility]  C-SSRS RISK CATEGORY No Risk Low Risk Low Risk        Assessment and Plan: Angelica Robertson is a 77 year old Caucasian female, married, retired, lives in [place], has a history of MDD, anxiety, hyperlipidemia was evaluated by telemedicine today.  Patient with improvement with regards to her mood symptoms, discussed plan as noted below.  Plan MDD in partial  remission Mirtazapine 30 mg p.o. nightly Lexapro 20 mg p.o. daily Rexulti 0.5 mg p.o. daily Patient referred to [facility] for ketamine nasal spray-completed consultation.  GAD-improving Mirtazapine 30 mg p.o. nightly Lexapro 20 mg p.o. daily Continue psychotherapy with Ms. Frederik [facility].  At risk for prolonged QT syndrome-we will order EKG to monitor for risk of QT prolongation given combination of medications like Rexulti, Lexapro.  Patient to either call [phone] or talk to her primary care provider.  High risk medication use-patient will benefit from repeating sodium level, platelet count.  Since she is on medications like Lexapro.  Order is in the system.  Patient however would like to talk to primary care provider at her next visit.    Follow-up in clinic in 1 month or sooner if needed. Collaboration of Care: Collaboration of Care: Primary Care Provider AEB patient encouraged to follow up with primary care provider as noted above since she prefers to get EKG and labs there and Referral or follow-up with counselor/therapist AEB encouraged to continue CBT  Patient/Guardian was advised Release of Information must be obtained prior to any record release in order to collaborate their care with an outside provider. Patient/Guardian was advised if they have not already done so to contact the registration department to sign all necessary forms in order for [place] to release information regarding their care.   Consent: Patient/Guardian gives verbal consent for treatment and assignment of benefits for services provided during this visit. Patient/Guardian expressed understanding and agreed to proceed.   This note was generated in part or whole with voice recognition software. Voice recognition is usually quite accurate but there are transcription errors that can and very often do occur. I apologize for any typographical errors that were not detected and corrected.    Jomarie Longs, MD 10/19/2023, 12:26 PM

## 2023-10-24 DIAGNOSIS — F331 Major depressive disorder, recurrent, moderate: Secondary | ICD-10-CM | POA: Diagnosis not present

## 2023-10-24 DIAGNOSIS — F4323 Adjustment disorder with mixed anxiety and depressed mood: Secondary | ICD-10-CM | POA: Diagnosis not present

## 2023-11-02 DIAGNOSIS — G3184 Mild cognitive impairment, so stated: Secondary | ICD-10-CM | POA: Diagnosis not present

## 2023-11-02 DIAGNOSIS — F411 Generalized anxiety disorder: Secondary | ICD-10-CM | POA: Diagnosis not present

## 2023-11-02 DIAGNOSIS — Z8249 Family history of ischemic heart disease and other diseases of the circulatory system: Secondary | ICD-10-CM | POA: Diagnosis not present

## 2023-11-02 DIAGNOSIS — K219 Gastro-esophageal reflux disease without esophagitis: Secondary | ICD-10-CM | POA: Diagnosis not present

## 2023-11-02 DIAGNOSIS — M1990 Unspecified osteoarthritis, unspecified site: Secondary | ICD-10-CM | POA: Diagnosis not present

## 2023-11-02 DIAGNOSIS — F321 Major depressive disorder, single episode, moderate: Secondary | ICD-10-CM | POA: Diagnosis not present

## 2023-11-02 DIAGNOSIS — E785 Hyperlipidemia, unspecified: Secondary | ICD-10-CM | POA: Diagnosis not present

## 2023-11-02 DIAGNOSIS — I10 Essential (primary) hypertension: Secondary | ICD-10-CM | POA: Diagnosis not present

## 2023-11-02 DIAGNOSIS — F4320 Adjustment disorder, unspecified: Secondary | ICD-10-CM | POA: Diagnosis not present

## 2023-11-05 ENCOUNTER — Ambulatory Visit (INDEPENDENT_AMBULATORY_CARE_PROVIDER_SITE_OTHER): Payer: Medicare PPO

## 2023-11-05 DIAGNOSIS — F411 Generalized anxiety disorder: Secondary | ICD-10-CM

## 2023-11-05 DIAGNOSIS — E538 Deficiency of other specified B group vitamins: Secondary | ICD-10-CM | POA: Diagnosis not present

## 2023-11-05 MED ORDER — CYANOCOBALAMIN 1000 MCG/ML IJ SOLN
1000.0000 ug | Freq: Once | INTRAMUSCULAR | Status: AC
Start: 2023-11-05 — End: 2023-11-05
  Administered 2023-11-05: 1000 ug via INTRAMUSCULAR

## 2023-11-05 NOTE — Progress Notes (Signed)
Pt presented for their vitamin B12 injection. Pt was identified through two identifiers. Pt tolerated shot well in their right deltoid.  

## 2023-11-06 MED ORDER — MIRTAZAPINE 30 MG PO TABS
30.0000 mg | ORAL_TABLET | Freq: Every day | ORAL | 3 refills | Status: DC
Start: 2023-11-06 — End: 2024-01-14

## 2023-11-06 MED ORDER — ESCITALOPRAM OXALATE 20 MG PO TABS: 20.0000 mg | ORAL_TABLET | Freq: Every day | ORAL | 3 refills | Status: DC

## 2023-11-06 NOTE — Telephone Encounter (Signed)
I have sent Lexapro and mirtazapine, 90 tablets with refills to pharmacy.

## 2023-11-13 DIAGNOSIS — F331 Major depressive disorder, recurrent, moderate: Secondary | ICD-10-CM | POA: Diagnosis not present

## 2023-11-13 DIAGNOSIS — F4323 Adjustment disorder with mixed anxiety and depressed mood: Secondary | ICD-10-CM | POA: Diagnosis not present

## 2023-11-14 ENCOUNTER — Encounter (INDEPENDENT_AMBULATORY_CARE_PROVIDER_SITE_OTHER): Payer: Medicare PPO

## 2023-11-14 DIAGNOSIS — F411 Generalized anxiety disorder: Secondary | ICD-10-CM

## 2023-11-14 DIAGNOSIS — F3341 Major depressive disorder, recurrent, in partial remission: Secondary | ICD-10-CM

## 2023-11-16 MED ORDER — REXULTI 0.5 MG PO TABS
0.2500 mg | ORAL_TABLET | Freq: Every day | ORAL | Status: DC
Start: 2023-11-16 — End: 2024-01-14

## 2023-11-16 MED ORDER — ALPRAZOLAM 0.25 MG PO TABS
0.2500 mg | ORAL_TABLET | Freq: Every day | ORAL | 1 refills | Status: DC | PRN
Start: 2023-11-16 — End: 2023-12-20

## 2023-11-16 NOTE — Telephone Encounter (Signed)
Will start reducing Rexulti since patient is interested in tapering it off.  We will discuss further medication changes at her visit on 11/26/2023.  Patient is concerned since her insurance will not cover Rexulti anymore and total cost per month is going to be $100.  Patient advised to reduce Rexulti to 0.25 mg-half tablet of the 0.5 mg until her visit with me on 11/26/2023.  We will send a short supply of Xanax low dosage to pharmacy for breakthrough anxiety.  Patient advised to limit the amount of use of this medication due to long-term adverse side effects, habit-forming potential.  We will consider getting urine drug screen at her next visit.    I have spent at least 11 minutes non face to face with patient today.

## 2023-11-21 DIAGNOSIS — M47816 Spondylosis without myelopathy or radiculopathy, lumbar region: Secondary | ICD-10-CM | POA: Diagnosis not present

## 2023-11-21 DIAGNOSIS — M533 Sacrococcygeal disorders, not elsewhere classified: Secondary | ICD-10-CM | POA: Diagnosis not present

## 2023-11-21 DIAGNOSIS — M5412 Radiculopathy, cervical region: Secondary | ICD-10-CM | POA: Diagnosis not present

## 2023-11-21 DIAGNOSIS — I700 Atherosclerosis of aorta: Secondary | ICD-10-CM | POA: Diagnosis not present

## 2023-11-21 DIAGNOSIS — M47814 Spondylosis without myelopathy or radiculopathy, thoracic region: Secondary | ICD-10-CM | POA: Diagnosis not present

## 2023-11-21 DIAGNOSIS — M545 Low back pain, unspecified: Secondary | ICD-10-CM | POA: Diagnosis not present

## 2023-11-21 DIAGNOSIS — M5020 Other cervical disc displacement, unspecified cervical region: Secondary | ICD-10-CM | POA: Diagnosis not present

## 2023-11-22 DIAGNOSIS — H2513 Age-related nuclear cataract, bilateral: Secondary | ICD-10-CM | POA: Diagnosis not present

## 2023-11-26 ENCOUNTER — Telehealth (INDEPENDENT_AMBULATORY_CARE_PROVIDER_SITE_OTHER): Payer: Medicare PPO | Admitting: Psychiatry

## 2023-11-26 ENCOUNTER — Encounter: Payer: Self-pay | Admitting: Psychiatry

## 2023-11-26 DIAGNOSIS — F411 Generalized anxiety disorder: Secondary | ICD-10-CM | POA: Diagnosis not present

## 2023-11-26 DIAGNOSIS — F3341 Major depressive disorder, recurrent, in partial remission: Secondary | ICD-10-CM

## 2023-11-26 NOTE — Progress Notes (Signed)
Virtual Visit via Video Note  I connected with Angelica Robertson on 11/26/23 at  3:00 PM EST by a video enabled telemedicine application and verified that I am speaking with the correct person using two identifiers.  Location Provider Location : [facility] Patient Location : Home  Participants: Patient , Provider   I discussed the limitations of evaluation and management by telemedicine and the availability of in person appointments. The patient expressed understanding and agreed to proceed.   I discussed the assessment and treatment plan with the patient. The patient was provided an opportunity to ask questions and all were answered. The patient agreed with the plan and demonstrated an understanding of the instructions.   The patient was advised to call back or seek an in-person evaluation if the symptoms worsen or if the condition fails to improve as anticipated.  BH MD OP Progress Note  11/27/2023 2:31 PM MAEKAYLA NORBUT  MRN:  [id number]  Chief Complaint:  Chief Complaint  Patient presents with   Follow-up   Depression   Anxiety   Medication Refill   HPI: Angelica Robertson is a 77 year old Caucasian female, married, retired, lives in [place], has a history of MDD, GAD, hyperlipidemia, arthritis, evaluated by telemedicine today.   The patient, with a history of major depression, generalized anxiety disorder, and gastroesophageal reflux disease (GERD), has been tapering off Rexulti due to cost concerns since December 7th. She is currently on Mirtazapine 30mg , Lexapro 20mg , and Rexulti 0.25mg . The patient reports no significant changes in mood since reducing the Rexulti dosage. She continues to experience morning nausea, which improves as the day progresses.  The patient had a recent fall due to loss of balance during a nocturnal bathroom visit, resulting in a bruise but no fractures. This incident has limited her social activities and mobility. She denies any correlation between the fall and  medication use.  The patient has been non-compliant with her pantoprazole medication for GERD, citing forgetfulness as the primary reason for discontinuation. She expresses willingness to resume the medication and set reminders for consistent use.  The patient's depression symptoms appear to be improving, with no reported suicidal ideation or attempts. She reports some residual symptoms of depression and anxiety, with occasional feelings of nervousness, worry, and lack of interest.  The patient's appetite is satisfactory, and she reports maintaining her weight. Sleep is generally good, with the ability to return to sleep after nocturnal awakenings. The patient has been in contact with her primary care provider and plans to have blood work done in early January. She expresses reluctance to start new medications unless necessary.    Visit Diagnosis:    ICD-10-CM   1. Recurrent major depressive disorder, in partial remission (HCC)  F33.41     2. GAD (generalized anxiety disorder)  F41.1       Past Psychiatric History: I have reviewed past psychiatric history from progress note on 01/12/2020. Previously attempted TMS-07/17/2022-did not tolerate it. Trials of medications in the past-duloxetine, Zoloft, Wellbutrin, Klonopin, trazodone, zolpidem, Seroquel. Inpatient behavioral health admissions multiple in the past-most recent-old [facility]-09/22/2023 - 09/30/2023.  Past Medical History:  Past Medical History:  Diagnosis Date   Depression 2000   Heart murmur    Hypercholesterolemia Osteoporosis   Hyperlipemia    Hypertension     Past Surgical History:  Procedure Laterality Date   ABDOMINAL HYSTERECTOMY  1980   COLONOSCOPY WITH PROPOFOL N/A 03/05/2018   Procedure: COLONOSCOPY WITH PROPOFOL;  Surgeon: Norma Fredrickson, Boykin Nearing, MD;  Location:  [facility] ENDOSCOPY;  Service: Endoscopy;  Laterality: N/A;   COLONOSCOPY WITH PROPOFOL N/A 04/23/2023   Procedure: COLONOSCOPY WITH PROPOFOL;  Surgeon:  Regis Bill, MD;  Location: [facility] ENDOSCOPY;  Service: Endoscopy;  Laterality: N/A;    Family Psychiatric History: I have reviewed family psychiatric history from progress note on 01/12/2020.  Family History:  Family History  Problem Relation Age of Onset   Heart disease Mother    Diabetes Mother    Heart disease Father    Diabetes Father    Colon cancer Other    Congestive Heart Failure Brother    Colon cancer Brother    Mental illness Paternal Aunt    BRCA 1/2 Neg Hx    Breast cancer Neg Hx    Cowden syndrome Neg Hx    DES usage Neg Hx    Endometrial cancer Neg Hx    Li-Fraumeni syndrome Neg Hx    Ovarian cancer Neg Hx     Social History: I have reviewed social history from progress note on 01/12/2020. Social History   Socioeconomic History   Marital status: Married    Spouse name: robert   Number of children: 2   Years of education: Not on file   Highest education level: 12th grade  Occupational History   Not on file  Tobacco Use   Smoking status: Never   Smokeless tobacco: Never  Vaping Use   Vaping status: Never Used  Substance and Sexual Activity   Alcohol use: Yes    Alcohol/week: 0.0 standard drinks of alcohol    Comment: occasionally - socially during the summer a glass of wine or beer   Drug use: No   Sexual activity: Yes  Other Topics Concern   Not on file  Social History Narrative   Not on file   Social Drivers of Health   Financial Resource Strain: Low Risk  (10/02/2023)   Overall Financial Resource Strain (CARDIA)    Difficulty of Paying Living Expenses: Not hard at all  Food Insecurity: No Food Insecurity (10/02/2023)   Hunger Vital Sign    Worried About Running Out of Food in the Last Year: Never true    Ran Out of Food in the Last Year: Never true  Transportation Needs: No Transportation Needs (10/02/2023)   PRAPARE - Administrator, Civil Service (Medical): No    Lack of Transportation (Non-Medical): No  Physical  Activity: Inactive (10/02/2023)   Exercise Vital Sign    Days of Exercise per Week: 0 days    Minutes of Exercise per Session: 30 min  Stress: Stress Concern Present (10/02/2023)   [employer] of Occupational Health - Occupational Stress Questionnaire    Feeling of Stress : To some extent  Social Connections: Socially Integrated (10/02/2023)   Social Connection and Isolation Panel [NHANES]    Frequency of Communication with Friends and Family: Twice a week    Frequency of Social Gatherings with Friends and Family: Once a week    Attends Religious Services: More than 4 times per year    Active Member of Clubs or Organizations: Yes    Attends Engineer, structural: More than 4 times per year    Marital Status: Married    Allergies: No Known Allergies  Metabolic Disorder Labs: Lab Results  Component Value Date   HGBA1C 6.0 (H) 08/10/2023   No results found for: "PROLACTIN" Lab Results  Component Value Date   CHOL 118 08/10/2023   TRIG 60 08/10/2023  HDL 67 08/10/2023   CHOLHDL 1.8 08/10/2023   VLDL 63.0 04/25/2023   LDLCALC 38 08/10/2023   LDLCALC 42 04/25/2023   Lab Results  Component Value Date   TSH 1.860 08/10/2023   TSH 1.20 09/22/2022    Therapeutic Level Labs: No results found for: "LITHIUM" No results found for: "VALPROATE" No results found for: "CBMZ"  Current Medications: Current Outpatient Medications  Medication Sig Dispense Refill   ALPRAZolam (XANAX) 0.25 MG tablet Take 1 tablet (0.25 mg total) by mouth daily as needed for anxiety. Use for severe anxiety attacks only, please limit use. 12 tablet 1   amLODipine (NORVASC) 2.5 MG tablet Take 1 tablet (2.5 mg total) by mouth daily. In am 90 tablet 3   Brexpiprazole (REXULTI) 0.5 MG TABS Take 0.5 tablets (0.25 mg total) by mouth daily.     calcium carbonate (OS-CAL) 600 MG TABS tablet Take 600 mg by mouth 2 (two) times daily with a meal.     escitalopram (LEXAPRO) 20 MG tablet Take 1 tablet  (20 mg total) by mouth daily. 90 tablet 3   losartan (COZAAR) 25 MG tablet TAKE 1 TABLET BY MOUTH ONCE DAILY (Patient taking differently: Take 25 mg by mouth at bedtime.) 90 tablet 1   mirtazapine (REMERON) 30 MG tablet Take 1 tablet (30 mg total) by mouth at bedtime. 90 tablet 3   pantoprazole (PROTONIX) 20 MG tablet Take 1 tablet (20 mg total) by mouth daily. 90 tablet 1   rosuvastatin (CRESTOR) 20 MG tablet Take 1 tablet (20 mg total) by mouth at bedtime. 90 tablet 3   VITAMIN D PO Take by mouth daily.     No current facility-administered medications for this visit.     Musculoskeletal: Strength & Muscle Tone:  UTA Gait & Station:  Seated Patient leans: N/A  Psychiatric Specialty Exam: Review of Systems  Gastrointestinal:  Positive for nausea.  Psychiatric/Behavioral:  Positive for dysphoric mood. The patient is nervous/anxious.     Last menstrual period 11/27/1979.There is no height or weight on file to calculate BMI.  General Appearance: Fairly Groomed  Eye Contact:  Fair  Speech:  Clear and Coherent  Volume:  Normal  Mood:  Anxious and Depressed improving  Affect:  Appropriate  Thought Process:  Goal Directed and Descriptions of Associations: Intact  Orientation:  Full (Time, Place, and Person)  Thought Content: Logical   Suicidal Thoughts:  No  Homicidal Thoughts:  No  Memory:  Immediate;   Fair Recent;   Fair Remote;   Fair  Judgement:  Fair  Insight:  Fair  Psychomotor Activity:  Normal  Concentration:  Concentration: Fair and Attention Span: Fair  Recall:  [employer] of Knowledge: Fair  Language: Fair  Akathisia:  No  Handed:  Right  AIMS (if indicated): not done  Assets:  Desire for Improvement Housing Social Support Transportation  ADL's:  Intact  Cognition: WNL  Sleep:  Fair   Screenings: AIMS    Flowsheet Row Office Visit from 10/02/2023 in [facility] Office Visit from 08/28/2023 in [facility] Office Visit from 06/20/2022 in [facility] Office Visit from 04/24/2022 in [facility]  AIMS Total Score 0 0 0 0      AUDIT    Flowsheet Row Admission (Discharged) from 05/12/2022 in [facility] Admission (Discharged) from 12/17/2019 in [facility] Admission (Discharged) [facility]  Alcohol Use Disorder Identification Test Final Score (AUDIT) 0 0 0      GAD-7    Flowsheet Row Video Visit from 11/26/2023 in [facility] Video Visit from 10/19/2023 in [facility] Office Visit from 10/02/2023 in [facility] Office Visit from 09/04/2023 in [facility] Visit from 08/28/2023 in [facility]  Total GAD-7 Score 3 9 9 12 16       Mini-Mental    Flowsheet Row Clinical Support from 02/27/2018 in [facility] Clinical Support from 02[facility]02/2017 in [facility] Clinical Support from 02[facility]03/2016 in [facility]  Total Score (max 30 points ) 30 30 30       PHQ2-9    Flowsheet Row Video Visit from 11/26/2023 in [facility] Video Visit from 10/19/2023 in [facility] Office Visit from 10/02/2023 in [facility] Office Visit from 09/04/2023 in [facility] Visit from 08/28/2023 in [facility]  PHQ-2 Total Score 1 2 4 4 6   PHQ-9 Total Score -- 8 12 14 22       Flowsheet  Row Video Visit from 11/26/2023 in [facility] Video Visit from 10/19/2023 in [facility] Office Visit from 10/02/2023 in [facility]  C-SSRS RISK CATEGORY No Risk No Risk Low Risk        Assessment and Plan: Angelica Robertson is a 77 year old Caucasian female, married, retired, lives in [place], has a history of MDD, anxiety, hyperlipidemia was evaluated by telemedicine today.  Patient with improvement of her mood symptoms currently on a lower dosage of Rexulti, discussed assessment and plan as noted below.   Major Depressive Disorder in partial remission Currently tapering off Rexulti due to cost concerns. No significant mood changes noted. Residual depressive symptoms present. Prefers not to start a new medication unless necessary. Insurance suggested Abilify as an alternative. Emphasized the risk of severe depression relapse if medication is stopped abruptly. Plan to call the pharmacy early January for reapproval to avoid interruption. - Continue Rexulti 0.25 mg daily  - Call pharmacy for refill in early January - Monitor mood and depressive symptoms - Consider Abilify if Rexulti is not reapproved - Patient advised to continue CBT with Ms. Victorino Dike at [facility].  Generalized Anxiety Disorder-improving Occasional anxiety and worry. Hesitant to use Xanax due to fear of falling. No significant changes in anxiety symptoms. Discussed using assistive devices to prevent falls and using Xanax for emergencies only. - Use Xanax 0.25 mg only for emergencies - Continue mirtazapine 30 mg at night. - Continue Lexapro 20 mg daily. - Ensure use of cane or walker to prevent falls - Monitor anxiety symptoms  Morning nausea, possibly related to GERD. Inconsistent use of pantoprazole due to forgetfulness. Discussed importance of consistent medication use and potential need for dosage  increase if symptoms persist. Suggested setting an alarm as a reminder. - Resume pantoprazole daily - Set an alarm to remember medication - Monitor nausea and GERD symptoms - Contact primary doctor if symptoms persist for dosage adjustment  General Health Maintenance Blood work including platelet count and sodium pending. Scheduled for  lab work with Dr. Lorin Picket in January. - Complete blood work in January with Dr. Lorin Picket  Follow-up - Follow-up appointment on February 3rd at 4 PM via video visit - Place on waitlist for earlier appointment if available.  Collaboration of Care: Collaboration of Care: Referral or follow-up with counselor/therapist AEB patient to continue CBT.  Patient/Guardian was advised Release of Information must be obtained prior to any record release in order to collaborate their care with an outside provider. Patient/Guardian was advised if they have not already done so to contact the registration department to sign all necessary forms in order for [place] to release information regarding their care.   Consent: Patient/Guardian gives verbal consent for treatment and assignment of benefits for services provided during this visit. Patient/Guardian expressed understanding and agreed to proceed.   This note was generated in part or whole with voice recognition software. Voice recognition is usually quite accurate but there are transcription errors that can and very often do occur. I apologize for any typographical errors that were not detected and corrected.    Jomarie Longs, MD 11/27/2023, 2:31 PM

## 2023-12-10 ENCOUNTER — Ambulatory Visit (INDEPENDENT_AMBULATORY_CARE_PROVIDER_SITE_OTHER): Payer: Medicare PPO

## 2023-12-10 DIAGNOSIS — E538 Deficiency of other specified B group vitamins: Secondary | ICD-10-CM | POA: Diagnosis not present

## 2023-12-10 MED ORDER — CYANOCOBALAMIN 1000 MCG/ML IJ SOLN
1000.0000 ug | Freq: Once | INTRAMUSCULAR | Status: AC
Start: 2023-12-10 — End: 2023-12-10
  Administered 2023-12-10: 1000 ug via INTRAMUSCULAR

## 2023-12-10 NOTE — Progress Notes (Signed)
Pt presents for b12 injection. Administered in L deltoid. Tolerated well. NO complaints or concerns at this time.

## 2023-12-13 ENCOUNTER — Telehealth: Payer: Self-pay | Admitting: Internal Medicine

## 2023-12-13 DIAGNOSIS — E7800 Pure hypercholesterolemia, unspecified: Secondary | ICD-10-CM

## 2023-12-13 DIAGNOSIS — E538 Deficiency of other specified B group vitamins: Secondary | ICD-10-CM

## 2023-12-13 DIAGNOSIS — I10 Essential (primary) hypertension: Secondary | ICD-10-CM

## 2023-12-13 DIAGNOSIS — E1165 Type 2 diabetes mellitus with hyperglycemia: Secondary | ICD-10-CM

## 2023-12-13 NOTE — Telephone Encounter (Signed)
 Patient need lab orders.

## 2023-12-13 NOTE — Telephone Encounter (Signed)
 Order placed for labs.

## 2023-12-18 ENCOUNTER — Other Ambulatory Visit (INDEPENDENT_AMBULATORY_CARE_PROVIDER_SITE_OTHER): Payer: Medicare PPO

## 2023-12-18 DIAGNOSIS — E1165 Type 2 diabetes mellitus with hyperglycemia: Secondary | ICD-10-CM

## 2023-12-18 DIAGNOSIS — E7800 Pure hypercholesterolemia, unspecified: Secondary | ICD-10-CM | POA: Diagnosis not present

## 2023-12-18 LAB — LIPID PANEL
Cholesterol: 144 mg/dL (ref 0–200)
HDL: 64.6 mg/dL (ref >39.00)
LDL Cholesterol: 63 mg/dL (ref 0–99)
NonHDL: 79.75
Total CHOL/HDL Ratio: 2
Triglycerides: 84 mg/dL (ref 0.0–149.0)
VLDL: 16.8 mg/dL (ref 0.0–40.0)

## 2023-12-18 LAB — CBC WITH DIFFERENTIAL/PLATELET
Basophils Absolute: 0.1 10*3/uL (ref 0.0–0.1)
Basophils Relative: 0.9 % (ref 0.0–3.0)
Eosinophils Absolute: 0.1 10*3/uL (ref 0.0–0.7)
Eosinophils Relative: 1.3 % (ref 0.0–5.0)
HCT: 41.2 % (ref 36.0–46.0)
Hemoglobin: 13.8 g/dL (ref 12.0–15.0)
Lymphocytes Relative: 16.4 % (ref 12.0–46.0)
Lymphs Abs: 1.7 10*3/uL (ref 0.7–4.0)
MCHC: 33.5 g/dL (ref 30.0–36.0)
MCV: 91.2 fL (ref 78.0–100.0)
Monocytes Absolute: 0.6 10*3/uL (ref 0.1–1.0)
Monocytes Relative: 5.8 % (ref 3.0–12.0)
Neutro Abs: 8 10*3/uL — ABNORMAL HIGH (ref 1.4–7.7)
Neutrophils Relative %: 75.6 % (ref 43.0–77.0)
Platelets: 365 10*3/uL (ref 150.0–400.0)
RBC: 4.52 Mil/uL (ref 3.87–5.11)
RDW: 13 % (ref 11.5–15.5)
WBC: 10.5 10*3/uL (ref 4.0–10.5)

## 2023-12-18 LAB — MICROALBUMIN / CREATININE URINE RATIO
Creatinine,U: 101.6 mg/dL
Microalb Creat Ratio: 0.7 mg/g (ref 0.0–30.0)
Microalb, Ur: 0.8 mg/dL (ref 0.0–1.9)

## 2023-12-18 LAB — BASIC METABOLIC PANEL
BUN: 18 mg/dL (ref 6–23)
CO2: 31 meq/L (ref 19–32)
Calcium: 10.7 mg/dL — ABNORMAL HIGH (ref 8.4–10.5)
Chloride: 99 meq/L (ref 96–112)
Creatinine, Ser: 0.74 mg/dL (ref 0.40–1.20)
GFR: 79.18 mL/min (ref >60.00)
Glucose, Bld: 138 mg/dL — ABNORMAL HIGH (ref 70–99)
Potassium: 4.2 meq/L (ref 3.5–5.1)
Sodium: 138 meq/L (ref 135–145)

## 2023-12-18 LAB — HEPATIC FUNCTION PANEL
ALT: 24 U/L (ref 0–35)
AST: 23 U/L (ref 0–37)
Albumin: 4.9 g/dL (ref 3.5–5.2)
Alkaline Phosphatase: 58 U/L (ref 39–117)
Bilirubin, Direct: 0.1 mg/dL (ref 0.0–0.3)
Total Bilirubin: 0.5 mg/dL (ref 0.2–1.2)
Total Protein: 7.4 g/dL (ref 6.0–8.3)

## 2023-12-18 LAB — HEMOGLOBIN A1C: Hgb A1c MFr Bld: 6.5 % (ref 4.6–6.5)

## 2023-12-20 ENCOUNTER — Ambulatory Visit: Payer: Medicare PPO | Admitting: Internal Medicine

## 2023-12-20 VITALS — BP 120/74 | HR 92 | Temp 98.2°F | Ht 63.0 in | Wt 124.0 lb

## 2023-12-20 DIAGNOSIS — E1165 Type 2 diabetes mellitus with hyperglycemia: Secondary | ICD-10-CM

## 2023-12-20 DIAGNOSIS — R55 Syncope and collapse: Secondary | ICD-10-CM | POA: Diagnosis not present

## 2023-12-20 DIAGNOSIS — Z9189 Other specified personal risk factors, not elsewhere classified: Secondary | ICD-10-CM | POA: Diagnosis not present

## 2023-12-20 DIAGNOSIS — I10 Essential (primary) hypertension: Secondary | ICD-10-CM

## 2023-12-20 DIAGNOSIS — E7800 Pure hypercholesterolemia, unspecified: Secondary | ICD-10-CM

## 2023-12-20 DIAGNOSIS — K219 Gastro-esophageal reflux disease without esophagitis: Secondary | ICD-10-CM | POA: Diagnosis not present

## 2023-12-20 DIAGNOSIS — F3341 Major depressive disorder, recurrent, in partial remission: Secondary | ICD-10-CM

## 2023-12-20 DIAGNOSIS — R208 Other disturbances of skin sensation: Secondary | ICD-10-CM | POA: Diagnosis not present

## 2023-12-20 NOTE — Progress Notes (Signed)
 Subjective:    Patient ID: Angelica Robertson, female    DOB: January 08, 1946, 77 y.o.   MRN: [id number]  Patient here for  Chief Complaint  Patient presents with   Medical Management of Chronic Issues    HPI Here for a scheduled follow up - here to follow up regarding her blood pressure and blood sugar and increased stress.  She is accompanied by her husband.  History obtained from both of them. She reports that she has been waking up - hot flashes and sweats. This has been occurring intermittently for months. Reports that approximately one month ago, she got up at night - had a sore throat - through her head back to gargle and passed out. Husband was there and went into the bathroom. States she was out - would not respond initially - brief period.  No seizure activity. Once she came around, she knew who he was.  Was not evaluated that day. No headache or dizziness. Reports residual pain - tail bone. Was evaluated by physiatry 11/21/23 - xray - thoracic, lumbar and sacral. Recommended PT.    Past Medical History:  Diagnosis Date   Depression 2000   Heart murmur    Hypercholesterolemia Osteoporosis   Hyperlipemia    Hypertension    Past Surgical History:  Procedure Laterality Date   ABDOMINAL HYSTERECTOMY  1980   COLONOSCOPY WITH PROPOFOL  N/A 03/05/2018   Procedure: COLONOSCOPY WITH PROPOFOL ;  Surgeon: Toledo, Ladell POUR, MD;  Location: [facility];  Service: Endoscopy;  Laterality: N/A;   COLONOSCOPY WITH PROPOFOL  N/A 04/23/2023   Procedure: COLONOSCOPY WITH PROPOFOL ;  Surgeon: Maryruth Ole DASEN, MD;  Location: [facility];  Service: Endoscopy;  Laterality: N/A;   Family History  Problem Relation Age of Onset   Heart disease Mother    Diabetes Mother    Heart disease Father    Diabetes Father    Colon cancer Other    Congestive Heart Failure Brother    Colon cancer Brother    Mental illness Paternal Aunt    BRCA 1/2 Neg Hx    Breast cancer Neg Hx    Cowden syndrome Neg Hx     DES usage Neg Hx    Endometrial cancer Neg Hx    Li-Fraumeni syndrome Neg Hx    Ovarian cancer Neg Hx    Social History   Socioeconomic History   Marital status: Married    Spouse name: robert   Number of children: 2   Years of education: Not on file   Highest education level: 12th grade  Occupational History   Not on file  Tobacco Use   Smoking status: Never   Smokeless tobacco: Never  Vaping Use   Vaping status: Never Used  Substance and Sexual Activity   Alcohol use: Yes    Alcohol/week: 0.0 standard drinks of alcohol    Comment: occasionally - socially during the summer a glass of wine or beer   Drug use: No   Sexual activity: Yes  Other Topics Concern   Not on file  Social History Narrative   Not on file   Social Drivers of Health   Financial Resource Strain: Low Risk  (12/17/2023)   Overall Financial Resource Strain (CARDIA)    Difficulty of Paying Living Expenses: Not hard at all  Food Insecurity: No Food Insecurity (12/17/2023)   Hunger Vital Sign    Worried About Running Out of Food in the Last Year: Never true    Ran Out of Food  in the Last Year: Never true  Transportation Needs: No Transportation Needs (12/17/2023)   PRAPARE - Administrator, Civil Service (Medical): No    Lack of Transportation (Non-Medical): No  Physical Activity: Inactive (12/17/2023)   Exercise Vital Sign    Days of Exercise per Week: 0 days    Minutes of Exercise per Session: 30 min  Stress: Stress Concern Present (12/17/2023)   [employer] of Occupational Health - Occupational Stress Questionnaire    Feeling of Stress : Rather much  Social Connections: Socially Integrated (12/17/2023)   Social Connection and Isolation Panel [NHANES]    Frequency of Communication with Friends and Family: Twice a week    Frequency of Social Gatherings with Friends and Family: More than three times a week    Attends Religious Services: More than 4 times per year    Active Member of [employer] or  Organizations: Yes    Attends Engineer, Structural: More than 4 times per year    Marital Status: Married     Review of Systems  Constitutional:  Negative for appetite change and unexpected weight change.  HENT:  Negative for congestion and sinus pressure.   Respiratory:  Negative for cough, chest tightness and shortness of breath.   Cardiovascular:  Negative for chest pain and palpitations.  Gastrointestinal:  Negative for abdominal pain, diarrhea, nausea and vomiting.  Endocrine:       Intermittent hot flashes and sweats as outlined.   Genitourinary:  Negative for difficulty urinating and dysuria.  Musculoskeletal:  Negative for joint swelling and myalgias.  Skin:  Negative for color change and rash.  Neurological:  Negative for dizziness and headaches.  Psychiatric/Behavioral:         Increased stress. Some anxiety - increased depression. Seeing psychiatry.        Objective:     BP 120/74   Pulse 92   Temp 98.2 F (36.8 C) (Oral)   Ht 5' 3 (1.6 m)   Wt 124 lb (56.2 kg)   LMP 11/27/1979   SpO2 96%   BMI 21.97 kg/m  Wt Readings from Last 3 Encounters:  12/20/23 124 lb (56.2 kg)  10/03/23 126 lb 6.4 oz (57.3 kg)  09/21/23 124 lb (56.2 kg)    Physical Exam Vitals reviewed.  Constitutional:      General: She is not in acute distress.    Appearance: Normal appearance.  HENT:     Head: Normocephalic and atraumatic.     Right Ear: External ear normal.     Left Ear: External ear normal.     Mouth/Throat:     Pharynx: No oropharyngeal exudate or posterior oropharyngeal erythema.  Eyes:     General: No scleral icterus.       Right eye: No discharge.        Left eye: No discharge.     Conjunctiva/sclera: Conjunctivae normal.  Neck:     Thyroid : No thyromegaly.  Cardiovascular:     Rate and Rhythm: Normal rate and regular rhythm.  Pulmonary:     Effort: No respiratory distress.     Breath sounds: Normal breath sounds. No wheezing.  Abdominal:      General: Bowel sounds are normal.     Palpations: Abdomen is soft.     Tenderness: There is no abdominal tenderness.  Musculoskeletal:        General: No swelling or tenderness.     Cervical back: Neck supple. No tenderness.  Lymphadenopathy:  Cervical: No cervical adenopathy.  Skin:    Findings: No erythema or rash.  Neurological:     Mental Status: She is alert.  Psychiatric:        Mood and Affect: Mood normal.        Behavior: Behavior normal.      Outpatient Encounter Medications as of 12/20/2023  Medication Sig   amLODipine  (NORVASC ) 2.5 MG tablet Take 1 tablet (2.5 mg total) by mouth daily. In am   Brexpiprazole  (REXULTI ) 0.5 MG TABS Take 0.5 tablets (0.25 mg total) by mouth daily.   calcium  carbonate (OS-CAL) 600 MG TABS tablet Take 600 mg by mouth 2 (two) times daily with a meal.   escitalopram  (LEXAPRO ) 20 MG tablet Take 1 tablet (20 mg total) by mouth daily.   losartan  (COZAAR ) 25 MG tablet TAKE 1 TABLET BY MOUTH ONCE DAILY (Patient taking differently: Take 25 mg by mouth at bedtime.)   mirtazapine  (REMERON ) 30 MG tablet Take 1 tablet (30 mg total) by mouth at bedtime.   pantoprazole  (PROTONIX ) 20 MG tablet Take 1 tablet (20 mg total) by mouth daily.   rosuvastatin  (CRESTOR ) 20 MG tablet Take 1 tablet (20 mg total) by mouth at bedtime.   VITAMIN D  PO Take by mouth daily.   [DISCONTINUED] ALPRAZolam  (XANAX ) 0.25 MG tablet Take 1 tablet (0.25 mg total) by mouth daily as needed for anxiety. Use for severe anxiety attacks only, please limit use. (Patient not taking: Reported on 12/20/2023)   No facility-administered encounter medications on file as of 12/20/2023.     Lab Results  Component Value Date   WBC 10.5 12/18/2023   HGB 13.8 12/18/2023   HCT 41.2 12/18/2023   PLT 365.0 12/18/2023   GLUCOSE 138 (H) 12/18/2023   CHOL 144 12/18/2023   TRIG 84.0 12/18/2023   HDL 64.60 12/18/2023   LDLCALC 63 12/18/2023   ALT 24 12/18/2023   AST 23 12/18/2023   NA 138 12/18/2023    K 4.2 12/18/2023   CL 99 12/18/2023   CREATININE 0.74 12/18/2023   BUN 18 12/18/2023   CO2 31 12/18/2023   TSH 1.74 12/20/2023   HGBA1C 6.5 12/18/2023   MICROALBUR <0.7 12/20/2023    CT HEAD WO CONTRAST ( ) Result Date: 09/21/2023 CLINICAL DATA:  Headache. Complains of depression. Alcohol and substance abuse. EXAM: CT HEAD WITHOUT CONTRAST TECHNIQUE: Contiguous axial images were obtained from the base of the skull through the vertex without intravenous contrast. RADIATION DOSE REDUCTION: This exam was performed according to the departmental dose-optimization program which includes automated exposure control, adjustment of the mA and/or kV according to patient size and/or use of iterative reconstruction technique. COMPARISON:  06/08/2022 FINDINGS: Brain: No evidence of acute infarction, hemorrhage, hydrocephalus, extra-axial collection or mass lesion/mass effect. There is mild patchy low-attenuation within the subcortical and periventricular white matter compatible with chronic microvascular disease. Vascular: No hyperdense vessel or unexpected calcification. Skull: Normal. Negative for fracture or focal lesion. Sinuses/Orbits: No acute finding. Other: None. IMPRESSION: 1. No acute intracranial abnormalities. 2. Chronic microvascular disease. Electronically Signed   By: Waddell Calk M.D.   On: 09/21/2023 12:52       Assessment & Plan:  Syncope, unspecified syncope type Assessment & Plan: Has had a syncopal episode previously. Previous EEG 2019 - normal. Unclear etiology. Denies any dizziness or light headedness. EKG - SR with no acute ischemic changes. Discussed further cardiac w/up - including referral to cardiology, monitor, echo?.  Stay hydrated.  Confirm eating regular meals. Check routine  labs as outlined.   Orders: -     EKG 12-Lead -     TSH -     Ambulatory referral to Cardiology  Type 2 diabetes mellitus with hyperglycemia, without long-term current use of insulin  (HCC) Assessment & Plan: Follow met b and A1c. Have asked her to check her blood sugar when she has these episodes of waking up with sweats - to confirm no low sugars.  Lab Results  Component Value Date   HGBA1C 6.5 12/18/2023     Orders: -     Microalbumin / creatinine urine ratio -     TSH  Burning sensation of feet Assessment & Plan: Describes over the last few weeks, she has noticed burning sensation in her feet. Check B12.  Follow. Sugars have been controlled.   Orders: -     Vitamin B12  Hypercalcemia Assessment & Plan: Recent lab revealed slightly elevated calcium . Stay hydrated.  Recheck calcium  level.   Orders: -     Calcium  -     VITAMIN D  25 Hydroxy (Vit-D Deficiency, Fractures)  MDD (major depressive disorder), recurrent, in partial remission (HCC) Assessment & Plan: Being followed by psychiatry.  Has been doing well on current regimen.  Continue f/u with psychiatry.    Hypertension, unspecified type Assessment & Plan: Continue losartan  and amlodipine . Pressure as outlined.  No change in medication.  Follow pressures.  Follow metabolic panel.    Hypercholesterolemia Assessment & Plan: On crestor .  Follow lipid panel and liver function tests.     Gastroesophageal reflux disease, unspecified whether esophagitis present Assessment & Plan: Doing well on protonix  20mg  q day.  Continue.  Follow.    At risk for prolonged QT interval syndrome Assessment & Plan: EKG today - did not reveal increasing QT/QTC. Follow.     I spent 45 minutes with the patient.  Time spent discussing her current concerns and symptoms.specifically time spent discussing the syncopal episode, residual pain and further w/up.  Time also spent discussing further w/up, evaluation and treatment.    Allena Hamilton, MD

## 2023-12-21 ENCOUNTER — Encounter: Payer: Self-pay | Admitting: Internal Medicine

## 2023-12-21 LAB — MICROALBUMIN / CREATININE URINE RATIO
Creatinine,U: 67.3 mg/dL
Microalb Creat Ratio: 1 mg/g (ref 0.0–30.0)
Microalb, Ur: <0.7 mg/dL (ref 0.0–1.9)

## 2023-12-21 LAB — VITAMIN D 25 HYDROXY (VIT D DEFICIENCY, FRACTURES): VITD: 65.87 ng/mL (ref 30.00–100.00)

## 2023-12-21 LAB — CALCIUM: Calcium: 9.9 mg/dL (ref 8.4–10.5)

## 2023-12-21 LAB — VITAMIN B12: Vitamin B-12: 744 pg/mL (ref 211–911)

## 2023-12-21 LAB — TSH: TSH: 1.74 u[IU]/mL (ref 0.35–5.50)

## 2023-12-23 ENCOUNTER — Encounter: Payer: Self-pay | Admitting: Internal Medicine

## 2023-12-23 NOTE — Assessment & Plan Note (Signed)
 EKG today - did not reveal increasing QT/QTC. Follow.

## 2023-12-23 NOTE — Assessment & Plan Note (Signed)
Doing well on protonix 20mg  q day.  Continue.  Follow.

## 2023-12-23 NOTE — Assessment & Plan Note (Signed)
 On crestor.  Follow lipid panel and liver function tests.

## 2023-12-23 NOTE — Assessment & Plan Note (Signed)
 Being followed by psychiatry.  Has been doing well on current regimen.  Continue f/u with psychiatry.

## 2023-12-23 NOTE — Assessment & Plan Note (Signed)
 Describes over the last few weeks, she has noticed burning sensation in her feet. Check B12.  Follow. Sugars have been controlled.

## 2023-12-23 NOTE — Assessment & Plan Note (Signed)
 Recent lab revealed slightly elevated calcium. Stay hydrated.  Recheck calcium level.

## 2023-12-23 NOTE — Assessment & Plan Note (Signed)
 Has had a syncopal episode previously. Previous EEG 2019 - normal. Unclear etiology. Denies any dizziness or light headedness. EKG - SR with no acute ischemic changes. Discussed further cardiac w/up - including referral to cardiology, monitor, echo?.  Stay hydrated.  Confirm eating regular meals. Check routine labs as outlined.

## 2023-12-23 NOTE — Assessment & Plan Note (Signed)
 Continue losartan and amlodipine. Pressure as outlined.  No change in medication.  Follow pressures.  Follow metabolic panel.

## 2023-12-23 NOTE — Assessment & Plan Note (Signed)
 Follow met b and A1c. Have asked her to check her blood sugar when she has these episodes of waking up with sweats - to confirm no low sugars.  Lab Results  Component Value Date   HGBA1C 6.5 12/18/2023

## 2023-12-24 ENCOUNTER — Encounter: Payer: Self-pay | Admitting: Internal Medicine

## 2023-12-24 DIAGNOSIS — R208 Other disturbances of skin sensation: Secondary | ICD-10-CM

## 2023-12-24 NOTE — Telephone Encounter (Signed)
 Order has been placed for nerve conduction studies.

## 2023-12-27 DIAGNOSIS — F4323 Adjustment disorder with mixed anxiety and depressed mood: Secondary | ICD-10-CM | POA: Diagnosis not present

## 2023-12-27 DIAGNOSIS — F331 Major depressive disorder, recurrent, moderate: Secondary | ICD-10-CM | POA: Diagnosis not present

## 2024-01-01 DIAGNOSIS — Z8659 Personal history of other mental and behavioral disorders: Secondary | ICD-10-CM | POA: Diagnosis not present

## 2024-01-01 DIAGNOSIS — E782 Mixed hyperlipidemia: Secondary | ICD-10-CM | POA: Diagnosis not present

## 2024-01-01 DIAGNOSIS — R55 Syncope and collapse: Secondary | ICD-10-CM | POA: Diagnosis not present

## 2024-01-01 DIAGNOSIS — I10 Essential (primary) hypertension: Secondary | ICD-10-CM | POA: Diagnosis not present

## 2024-01-02 ENCOUNTER — Other Ambulatory Visit: Payer: Self-pay | Admitting: Internal Medicine

## 2024-01-02 ENCOUNTER — Encounter: Payer: Self-pay | Admitting: Internal Medicine

## 2024-01-02 ENCOUNTER — Ambulatory Visit
Admission: RE | Admit: 2024-01-02 | Discharge: 2024-01-02 | Disposition: A | Discharge status: Home / Self Care | Payer: Self-pay | Source: Ambulatory Visit | Attending: Internal Medicine | Admitting: Internal Medicine

## 2024-01-02 DIAGNOSIS — R55 Syncope and collapse: Secondary | ICD-10-CM

## 2024-01-02 NOTE — Telephone Encounter (Signed)
Discussed with Quenten Raven. She spoke to Ms Bookwalter. No chest pain. Reports nothing acute going on. Amlodipine was stopped. She was questioning if she should be back on the medication. Schedule an appt with me tomorrow. Will be evaluated earlier if any acute change.

## 2024-01-02 NOTE — Telephone Encounter (Signed)
Please call and confirm doing ok.

## 2024-01-03 ENCOUNTER — Ambulatory Visit: Payer: Medicare PPO | Admitting: Internal Medicine

## 2024-01-03 ENCOUNTER — Encounter: Payer: Self-pay | Admitting: Internal Medicine

## 2024-01-03 VITALS — BP 110/62 | HR 81 | Temp 97.7°F | Ht 63.0 in | Wt 124.6 lb

## 2024-01-03 DIAGNOSIS — E1165 Type 2 diabetes mellitus with hyperglycemia: Secondary | ICD-10-CM | POA: Diagnosis not present

## 2024-01-03 DIAGNOSIS — E538 Deficiency of other specified B group vitamins: Secondary | ICD-10-CM

## 2024-01-03 DIAGNOSIS — E7800 Pure hypercholesterolemia, unspecified: Secondary | ICD-10-CM

## 2024-01-03 DIAGNOSIS — I10 Essential (primary) hypertension: Secondary | ICD-10-CM

## 2024-01-03 DIAGNOSIS — R55 Syncope and collapse: Secondary | ICD-10-CM | POA: Diagnosis not present

## 2024-01-03 DIAGNOSIS — F3341 Major depressive disorder, recurrent, in partial remission: Secondary | ICD-10-CM | POA: Diagnosis not present

## 2024-01-03 MED ORDER — CYANOCOBALAMIN 1000 MCG/ML IJ SOLN
1000.0000 ug | Freq: Once | INTRAMUSCULAR | Status: AC
  Administered 2024-01-03: 1000 ug via INTRAMUSCULAR

## 2024-01-03 NOTE — Progress Notes (Signed)
Subjective:    Patient ID: Angelica Robertson, female    DOB: 1946/10/25, 78 y.o.   MRN: [id number]  Patient here for  Chief Complaint  Patient presents with   discuss medication    HPI Here as a work in - work with with concerns regarding "not feeling well". I recently saw her and referred her to cardiology for syncopal episode. Saw cardiology 01/01/24. Recommended for her to stop amlodipine. Also recommended holter monitor, echo, carotid ultrasound and CT cardiac scoring. Is scheduled for echo and carotid ultrasound next week. Has had no further syncopal episodes. Headache yesterday. Better today. Breathing stable. Has started some exercise in her home.  Ahs not started going back to the gym. Is feeling some better today. No abdominal pain or bowel change reported.    Past Medical History:  Diagnosis Date   Depression 2000   Heart murmur    Hypercholesterolemia Osteoporosis   Hyperlipemia    Hypertension    Past Surgical History:  Procedure Laterality Date   ABDOMINAL HYSTERECTOMY  1980   COLONOSCOPY WITH PROPOFOL N/A 03/05/2018   Procedure: COLONOSCOPY WITH PROPOFOL;  Surgeon: Toledo, Boykin Nearing, MD;  Location: [facility];  Service: Endoscopy;  Laterality: N/A;   COLONOSCOPY WITH PROPOFOL N/A 04/23/2023   Procedure: COLONOSCOPY WITH PROPOFOL;  Surgeon: Regis Bill, MD;  Location: [facility];  Service: Endoscopy;  Laterality: N/A;   Family History  Problem Relation Age of Onset   Heart disease Mother    Diabetes Mother    Heart disease Father    Diabetes Father    Colon cancer Other    Congestive Heart Failure Brother    Colon cancer Brother    Mental illness Paternal Aunt    BRCA 1/2 Neg Hx    Breast cancer Neg Hx    Cowden syndrome Neg Hx    DES usage Neg Hx    Endometrial cancer Neg Hx    Li-Fraumeni syndrome Neg Hx    Ovarian cancer Neg Hx    Social History   Socioeconomic History   Marital status: Married    Spouse name: robert   Number of children:  2   Years of education: Not on file   Highest education level: 12th grade  Occupational History   Not on file  Tobacco Use   Smoking status: Never   Smokeless tobacco: Never  Vaping Use   Vaping status: Never Used  Substance and Sexual Activity   Alcohol use: Yes    Alcohol/week: 0.0 standard drinks of alcohol    Comment: occasionally - socially during the summer a glass of wine or beer   Drug use: No   Sexual activity: Yes  Other Topics Concern   Not on file  Social History Narrative   Not on file   Social Drivers of Health   Financial Resource Strain: Low Risk  (12/17/2023)   Overall Financial Resource Strain (CARDIA)    Difficulty of Paying Living Expenses: Not hard at all  Food Insecurity: No Food Insecurity (12/17/2023)   Hunger Vital Sign    Worried About Running Out of Food in the Last Year: Never true    Ran Out of Food in the Last Year: Never true  Transportation Needs: No Transportation Needs (12/17/2023)   PRAPARE - Administrator, Civil Service (Medical): No    Lack of Transportation (Non-Medical): No  Physical Activity: Inactive (12/17/2023)   Exercise Vital Sign    Days of Exercise per Week:  0 days    Minutes of Exercise per Session: 30 min  Stress: Stress Concern Present (12/17/2023)   [employer] of Occupational Health - Occupational Stress Questionnaire    Feeling of Stress : Rather much  Social Connections: Socially Integrated (12/17/2023)   Social Connection and Isolation Panel [NHANES]    Frequency of Communication with Friends and Family: Twice a week    Frequency of Social Gatherings with Friends and Family: More than three times a week    Attends Religious Services: More than 4 times per year    Active Member of [employer] or Organizations: Yes    Attends Engineer, structural: More than 4 times per year    Marital Status: Married     Review of Systems  Constitutional:  Negative for appetite change and unexpected weight change.   HENT:  Negative for congestion and sinus pressure.   Respiratory:  Negative for cough, chest tightness and shortness of breath.   Cardiovascular:  Negative for chest pain, palpitations and leg swelling.  Gastrointestinal:  Negative for abdominal pain, diarrhea, nausea and vomiting.  Genitourinary:  Negative for difficulty urinating and dysuria.  Musculoskeletal:  Negative for joint swelling and myalgias.  Skin:  Negative for color change and rash.  Neurological:  Negative for dizziness.       Headache yesterday. Better today.   Psychiatric/Behavioral:  Negative for agitation.        Increased stress related to changing medication and testing, etc.        Objective:     BP 110/62   Pulse 81   Temp 97.7 F (36.5 C) (Oral)   Ht 5\' 3"  (1.6 m)   Wt 124 lb 9.6 oz (56.5 kg)   LMP 11/27/1979   SpO2 98%   BMI 22.07 kg/m  Wt Readings from Last 3 Encounters:  01/03/24 124 lb 9.6 oz (56.5 kg)  12/20/23 124 lb (56.2 kg)  10/03/23 126 lb 6.4 oz (57.3 kg)    Physical Exam Vitals reviewed.  Constitutional:      General: She is not in acute distress.    Appearance: Normal appearance.  HENT:     Head: Normocephalic and atraumatic.     Right Ear: External ear normal.     Left Ear: External ear normal.     Mouth/Throat:     Pharynx: No oropharyngeal exudate or posterior oropharyngeal erythema.  Eyes:     General: No scleral icterus.       Right eye: No discharge.        Left eye: No discharge.     Conjunctiva/sclera: Conjunctivae normal.  Neck:     Thyroid: No thyromegaly.  Cardiovascular:     Rate and Rhythm: Normal rate and regular rhythm.  Pulmonary:     Effort: No respiratory distress.     Breath sounds: Normal breath sounds. No wheezing.  Abdominal:     General: Bowel sounds are normal.     Palpations: Abdomen is soft.     Tenderness: There is no abdominal tenderness.  Musculoskeletal:        General: No swelling or tenderness.     Cervical back: Neck supple. No  tenderness.  Lymphadenopathy:     Cervical: No cervical adenopathy.  Skin:    Findings: No erythema or rash.  Neurological:     Mental Status: She is alert.  Psychiatric:        Mood and Affect: Mood normal.        Behavior: Behavior  normal.         Outpatient Encounter Medications as of 01/03/2024  Medication Sig   Brexpiprazole (REXULTI) 0.5 MG TABS Take 0.5 tablets (0.25 mg total) by mouth daily.   calcium carbonate (OS-CAL) 600 MG TABS tablet Take 600 mg by mouth 2 (two) times daily with a meal.   escitalopram (LEXAPRO) 20 MG tablet Take 1 tablet (20 mg total) by mouth daily.   losartan (COZAAR) 25 MG tablet TAKE 1 TABLET BY MOUTH ONCE DAILY (Patient taking differently: Take 25 mg by mouth at bedtime.)   mirtazapine (REMERON) 30 MG tablet Take 1 tablet (30 mg total) by mouth at bedtime.   pantoprazole (PROTONIX) 20 MG tablet Take 1 tablet (20 mg total) by mouth daily.   rosuvastatin (CRESTOR) 20 MG tablet Take 1 tablet (20 mg total) by mouth at bedtime.   VITAMIN D PO Take by mouth daily.   [DISCONTINUED] amLODipine (NORVASC) 2.5 MG tablet Take 1 tablet (2.5 mg total) by mouth daily. In am (Patient not taking: Reported on 01/03/2024)   [EXPIRED] cyanocobalamin (VITAMIN B12) injection 1,000 mcg    No facility-administered encounter medications on file as of 01/03/2024.     Lab Results  Component Value Date   WBC 10.5 12/18/2023   HGB 13.8 12/18/2023   HCT 41.2 12/18/2023   PLT 365.0 12/18/2023   GLUCOSE 138 (H) 12/18/2023   CHOL 144 12/18/2023   TRIG 84.0 12/18/2023   HDL 64.60 12/18/2023   LDLCALC 63 12/18/2023   ALT 24 12/18/2023   AST 23 12/18/2023   NA 138 12/18/2023   K 4.2 12/18/2023   CL 99 12/18/2023   CREATININE 0.74 12/18/2023   BUN 18 12/18/2023   CO2 31 12/18/2023   TSH 1.74 12/20/2023   HGBA1C 6.5 12/18/2023   MICROALBUR <0.7 12/20/2023       Assessment & Plan:  B12 deficiency -     Cyanocobalamin  Type 2 diabetes mellitus with hyperglycemia,  without long-term current use of insulin (HCC) Assessment & Plan: Follow met b and A1c. No further syncopal episodes.  Lab Results  Component Value Date   HGBA1C 6.5 12/18/2023      Syncope, unspecified syncope type Assessment & Plan: Has had a syncopal episode previously. Previous EEG 2019 - normal. Unclear etiology. Denied any dizziness or light headedness. Previous EKG - SR with no acute ischemic changes. Discussed further cardiac w/up - including referral to cardiology, monitor, echo. Saw cardiology.  Planning carotid ultrasound and echo next week.  Also planning to f/u with neurology for further evaluation. Off amlodipine now. Follow pressures. Will remain off for now. Discussed.    MDD (major depressive disorder), recurrent, in partial remission (HCC) Assessment & Plan: Being followed by psychiatry.  Has been better overall on current regimen.  Continue f/u with psychiatry.    Hypertension, unspecified type Assessment & Plan: Continue losartan. Off amlodipine. Pressure as outlined.  No change in medication.  Follow pressures.  Follow metabolic panel.    Hypercholesterolemia Assessment & Plan: On crestor.  Follow lipid panel and liver function tests.        Dale [place], MD

## 2024-01-06 ENCOUNTER — Encounter: Payer: Self-pay | Admitting: Internal Medicine

## 2024-01-06 NOTE — Assessment & Plan Note (Signed)
Being followed by psychiatry.  Has been better overall on current regimen.  Continue f/u with psychiatry.

## 2024-01-06 NOTE — Assessment & Plan Note (Signed)
On crestor.  Follow lipid panel and liver function tests.

## 2024-01-06 NOTE — Assessment & Plan Note (Signed)
Follow met b and A1c. No further syncopal episodes.  Lab Results  Component Value Date   HGBA1C 6.5 12/18/2023

## 2024-01-06 NOTE — Assessment & Plan Note (Signed)
Has had a syncopal episode previously. Previous EEG 2019 - normal. Unclear etiology. Denied any dizziness or light headedness. Previous EKG - SR with no acute ischemic changes. Discussed further cardiac w/up - including referral to cardiology, monitor, echo. Saw cardiology.  Planning carotid ultrasound and echo next week.  Also planning to f/u with neurology for further evaluation. Off amlodipine now. Follow pressures. Will remain off for now. Discussed.

## 2024-01-06 NOTE — Assessment & Plan Note (Signed)
Continue losartan. Off amlodipine. Pressure as outlined.  No change in medication.  Follow pressures.  Follow metabolic panel.

## 2024-01-07 ENCOUNTER — Ambulatory Visit: Payer: Medicare PPO

## 2024-01-10 DIAGNOSIS — R55 Syncope and collapse: Secondary | ICD-10-CM | POA: Diagnosis not present

## 2024-01-14 ENCOUNTER — Telehealth: Payer: Medicare PPO | Admitting: Psychiatry

## 2024-01-14 ENCOUNTER — Encounter: Payer: Self-pay | Admitting: Psychiatry

## 2024-01-14 DIAGNOSIS — F3341 Major depressive disorder, recurrent, in partial remission: Secondary | ICD-10-CM | POA: Diagnosis not present

## 2024-01-14 DIAGNOSIS — F411 Generalized anxiety disorder: Secondary | ICD-10-CM | POA: Diagnosis not present

## 2024-01-14 MED ORDER — MIRTAZAPINE 15 MG PO TABS: 22.5000 mg | ORAL_TABLET | Freq: Every day | ORAL | 1 refills | Status: DC

## 2024-01-14 MED ORDER — REXULTI 0.5 MG PO TABS: 0.2500 mg | ORAL_TABLET | Freq: Every day | ORAL | 2 refills | Status: DC

## 2024-01-14 NOTE — Progress Notes (Unsigned)
Virtual Visit via Video Note  I connected with Angelica Robertson on 01/14/24 at  4:00 PM EST by a video enabled telemedicine application and verified that I am speaking with the correct person using two identifiers.  Location Provider Location : [facility] Patient Location : Home  Participants: Patient , Provider   I discussed the limitations of evaluation and management by telemedicine and the availability of in person appointments. The patient expressed understanding and agreed to proceed.   I discussed the assessment and treatment plan with the patient. The patient was provided an opportunity to ask questions and all were answered. The patient agreed with the plan and demonstrated an understanding of the instructions.   The patient was advised to call back or seek an in-person evaluation if the symptoms worsen or if the condition fails to improve as anticipated.   BH MD OP Progress Note  01/14/2024 4:30 PM AREYA LEMMERMAN  MRN:  [id number]  Chief Complaint:  Chief Complaint  Patient presents with   Follow-up   Anxiety   Depression   Medication Refill   HPI: Angelica Robertson is a 78 year old Caucasian female, married, retired, lives in [place], has a history of MDD, GAD, hyperlipidemia, arthritis was evaluated by telemedicine today.  She has a history of major depression, which was in partial remission at her last visit in December. Over the past two weeks, she experienced a lack of interest in activities for two to three days and felt down for one day. Her sleep is adequate with medication, but she wonders if it affects her morning feelings. She feels tired until mid-afternoon daily, has a poor appetite but eats three meals and a snack daily. No feelings of failure, concentration problems, or suicidal thoughts. She finds managing daily activities somewhat difficult due to these symptoms.  She feels 'queasy' and weak in the mornings without vomiting. She resumed taking Protonix daily after her last  visit, but it has not alleviated the queasiness. She takes Rexulti 0.25 mg in the morning, which she cuts from a 0.5 mg tablet, and has been on this lower dosage for about two months without noticing any worsening of her depression or anxiety. She pays $100 a month for Rexulti and has a full bottle remaining. She also takes 30 mg of Remeron at night, which she suspects may contribute to her morning symptoms.  She reports ongoing soreness from a fall in December, which requires her to sit on a special pillow. The soreness has improved but is not completely resolved. She fainted during the incident, and further investigations are ongoing. She has been referred to a cardiologist and a neurologist for additional evaluation. She experiences a burning sensation in her feet intermittently, prompting the neurology referral.  She has gone out a few times for Bible study but is limited by the need to use a special pillow. She meets with her therapist, [name], once a month and has an appointment next week.  She currently denies any suicidality, homicidality or perceptual disturbances.  Visit Diagnosis:    ICD-10-CM   1. Recurrent major depressive disorder, in partial remission (HCC)  F33.41 mirtazapine (REMERON) 15 MG tablet    Brexpiprazole (REXULTI) 0.5 MG TABS    2. GAD (generalized anxiety disorder)  F41.1 mirtazapine (REMERON) 15 MG tablet    Brexpiprazole (REXULTI) 0.5 MG TABS      Past Psychiatric History: I have reviewed past psychiatric history from progress note on 01/12/2020. Previously attempted TMS-07/17/2022-did not tolerate it. Past trials  of medications like duloxetine, Zoloft, Wellbutrin, Klonopin, trazodone, zolpidem, Seroquel. Inpatient behavioral health admissions multiple in the past-most recent-old [facility] -09/22/23-09/30/23.    Past Medical History:  Past Medical History:  Diagnosis Date   Depression 2000   Heart murmur    Hypercholesterolemia Osteoporosis    Hyperlipemia    Hypertension     Past Surgical History:  Procedure Laterality Date   ABDOMINAL HYSTERECTOMY  1980   COLONOSCOPY WITH PROPOFOL N/A 03/05/2018   Procedure: COLONOSCOPY WITH PROPOFOL;  Surgeon: Toledo, Boykin Nearing, MD;  Location: [facility] ENDOSCOPY;  Service: Endoscopy;  Laterality: N/A;   COLONOSCOPY WITH PROPOFOL N/A 04/23/2023   Procedure: COLONOSCOPY WITH PROPOFOL;  Surgeon: Regis Bill, MD;  Location: [facility] ENDOSCOPY;  Service: Endoscopy;  Laterality: N/A;    Family Psychiatric History: I have reviewed family psychiatric history from progress note on 01/12/2020.  Family History:  Family History  Problem Relation Age of Onset   Heart disease Mother    Diabetes Mother    Heart disease Father    Diabetes Father    Colon cancer Other    Congestive Heart Failure Brother    Colon cancer Brother    Mental illness Paternal Aunt    BRCA 1/2 Neg Hx    Breast cancer Neg Hx    Cowden syndrome Neg Hx    DES usage Neg Hx    Endometrial cancer Neg Hx    Li-Fraumeni syndrome Neg Hx    Ovarian cancer Neg Hx     Social History: I have reviewed social history from progress note on 01/12/2020. Social History   Socioeconomic History   Marital status: Married    Spouse name: robert   Number of children: 2   Years of education: Not on file   Highest education level: 12th grade  Occupational History   Not on file  Tobacco Use   Smoking status: Never   Smokeless tobacco: Never  Vaping Use   Vaping status: Never Used  Substance and Sexual Activity   Alcohol use: Yes    Alcohol/week: 0.0 standard drinks of alcohol    Comment: occasionally - socially during the summer a glass of wine or beer   Drug use: No   Sexual activity: Yes  Other Topics Concern   Not on file  Social History Narrative   Not on file   Social Drivers of Health   Financial Resource Strain: Low Risk  (12/17/2023)   Overall Financial Resource Strain (CARDIA)    Difficulty of Paying Living Expenses: Not  hard at all  Food Insecurity: No Food Insecurity (12/17/2023)   Hunger Vital Sign    Worried About Running Out of Food in the Last Year: Never true    Ran Out of Food in the Last Year: Never true  Transportation Needs: No Transportation Needs (12/17/2023)   PRAPARE - Administrator, Civil Service (Medical): No    Lack of Transportation (Non-Medical): No  Physical Activity: Inactive (12/17/2023)   Exercise Vital Sign    Days of Exercise per Week: 0 days    Minutes of Exercise per Session: 30 min  Stress: Stress Concern Present (12/17/2023)   [employer] of Occupational Health - Occupational Stress Questionnaire    Feeling of Stress : Rather much  Social Connections: Socially Integrated (12/17/2023)   Social Connection and Isolation Panel [NHANES]    Frequency of Communication with Friends and Family: Twice a week    Frequency of Social Gatherings with Friends and Family:  More than three times a week    Attends Religious Services: More than 4 times per year    Active Member of Clubs or Organizations: Yes    Attends Banker Meetings: More than 4 times per year    Marital Status: Married    Allergies: No Known Allergies  Metabolic Disorder Labs: Lab Results  Component Value Date   HGBA1C 6.5 12/18/2023   No results found for: "PROLACTIN" Lab Results  Component Value Date   CHOL 144 12/18/2023   TRIG 84.0 12/18/2023   HDL 64.60 12/18/2023   CHOLHDL 2 12/18/2023   VLDL 16.8 12/18/2023   LDLCALC 63 12/18/2023   LDLCALC 38 08/10/2023   Lab Results  Component Value Date   TSH 1.74 12/20/2023   TSH 1.860 08/10/2023    Therapeutic Level Labs: No results found for: "LITHIUM" No results found for: "VALPROATE" No results found for: "CBMZ"  Current Medications: Current Outpatient Medications  Medication Sig Dispense Refill   mirtazapine (REMERON) 15 MG tablet Take 1.5 tablets (22.5 mg total) by mouth at bedtime. 45 tablet 1   Brexpiprazole (REXULTI) 0.5  MG TABS Take 0.5 tablets (0.25 mg total) by mouth daily. 15 tablet 2   calcium carbonate (OS-CAL) 600 MG TABS tablet Take 600 mg by mouth 2 (two) times daily with a meal.     escitalopram (LEXAPRO) 20 MG tablet Take 1 tablet (20 mg total) by mouth daily. 90 tablet 3   losartan (COZAAR) 25 MG tablet TAKE 1 TABLET BY MOUTH ONCE DAILY (Patient taking differently: Take 25 mg by mouth at bedtime.) 90 tablet 1   pantoprazole (PROTONIX) 20 MG tablet Take 1 tablet (20 mg total) by mouth daily. 90 tablet 1   rosuvastatin (CRESTOR) 20 MG tablet Take 1 tablet (20 mg total) by mouth at bedtime. 90 tablet 3   VITAMIN D PO Take by mouth daily.     No current facility-administered medications for this visit.     Musculoskeletal: Strength & Muscle Tone:  UTA Gait & Station:  Seated Patient leans: N/A  Psychiatric Specialty Exam: Review of Systems  Psychiatric/Behavioral:  Positive for dysphoric mood.     Last menstrual period 11/27/1979.There is no height or weight on file to calculate BMI.  General Appearance: Fairly Groomed  Eye Contact:  Fair  Speech:  Clear and Coherent  Volume:  Normal  Mood:   Depression is improving although feels groggy but tired throughout the day.  Affect:  Appropriate  Thought Process:  Goal Directed and Descriptions of Associations: Intact  Orientation:  Full (Time, Place, and Person)  Thought Content: Logical   Suicidal Thoughts:  No  Homicidal Thoughts:  No  Memory:  Immediate;   Fair Recent;   Fair Remote;   Fair  Judgement:  Fair  Insight:  Fair  Psychomotor Activity:  Normal  Concentration:  Concentration: Fair and Attention Span: Fair  Recall:  [employer] of Knowledge: Fair  Language: Fair  Akathisia:  No  Handed:  Right  AIMS (if indicated): not done  Assets:  Desire for Improvement Housing Social Support  ADL's:  Intact  Cognition: WNL  Sleep:  Fair   Screenings: Midwife Visit from 10/02/2023 in [facility] Office Visit from 08/28/2023 in [facility] Office Visit from 06/20/2022 in [facility] Office Visit from 04/24/2022 in [facility]  AIMS Total  Score 0 0 0 0      AUDIT    Flowsheet Row Admission (Discharged) from 05/12/2022 in [facility] Admission (Discharged) from 12/17/2019 in [facility]IENT [facility]AL MEDICINE Admission (Discharged) from [facility]  Alcohol Use Disorder Identification Test Final Score (AUDIT) 0 0 0      GAD-7    Flowsheet Row Office Visit from 01/03/2024 in [facility] Video Visit from 11/26/2023 in [facility] Video Visit from 10/19/2023 in [facility] Office Visit from 10/02/2023 in [facility] Office Visit from 09/04/2023 in [facility]  Total GAD-7 Score 3 3 9 9 12       Mini-Mental    Flowsheet Row Clinical Support from 02/27/2018 in [facility] Clinical Support from 02/02/2017 in [facility] Clinical Support from 02/03/2016 in [facility]  Total Score (max 30 points ) 30 30 30       PHQ2-9    Flowsheet Row Video Visit from 01/14/2024 in [facility] Office Visit from 01/03/2024 in [facility] Visit from 12/20/2023 in [facility] Video Visit from 11/26/2023 in [facility] Video Visit from 10/19/2023 in [facility]   PHQ-2 Total Score 2 2 3 1 2   PHQ-9 Total Score 7 8 11  -- 8      Flowsheet Row Video Visit from 11/26/2023 in [facility] Video Visit from 10/19/2023 in [facility] Office Visit from 10/02/2023 in [facility]  C-SSRS RISK CATEGORY No Risk No Risk Low Risk        Assessment and Plan: KAYLLA COBOS is a 78 year old Caucasian female, married, retired, lives in [place], has a history of MDD, anxiety, hyperlipidemia was evaluated by telemedicine today.  Patient is currently improving with regards to depression symptoms although continues to feel groggy or tired throughout the day likely multifactorial, discussed assessment and plan as noted below.   Major Depressive Disorder, Partial Remission Reports some improvement in depression but still experiences residual symptoms such as morning queasiness and low energy until the afternoon. On a reduced dose of Rexulti (0.25 mg) for two months without worsening of symptoms. Taking mirtazapine (30 mg) at night, which may be contributing to morning symptoms. Depression screening indicates partial remission with some residual symptoms. Discussed reducing mirtazapine to 22.5 mg to potentially alleviate morning symptoms. Explained that changes in medication may take 4-6 weeks to show effects and advised to monitor symptoms during this period. - Reduce mirtazapine to 22.5 mg by cutting the 30 mg tablet into quarters - Continue Rexulti 0.25 mg - Follow up in 7 weeks to reassess symptoms and medication efficacy  Generalized Anxiety Disorder-improving Reports no significant changes in anxiety symptoms since the last visit. Continues to take Lexapro and attend therapy sessions monthly with Victorino Dike. - Continue current dose of Lexapro at 20 mg daily - Continue monthly therapy sessions with [name] at [facility].   Follow-up - Follow  up in 7 weeks, on March 24th at 3:30 PM by video.  Collaboration of Care: Collaboration of Care: Referral or follow-up with counselor/therapist AEB patient encouraged  to continue CBT  Patient/Guardian was advised Release of Information must be obtained prior to any record release in order to collaborate their care with an outside provider. Patient/Guardian was advised if they have not already done so to contact the registration department to sign all necessary forms in order for [place] to release information regarding their care.   Consent: Patient/Guardian gives verbal consent for treatment and assignment of benefits for services provided during this visit. Patient/Guardian expressed understanding and agreed to proceed.   This note was generated in part or whole with voice recognition software. Voice recognition is usually quite accurate but there are transcription errors that can and very often do occur. I apologize for any typographical errors that were not detected and corrected.    Jomarie Longs, MD 01/14/2024, 4:30 PM

## 2024-01-15 ENCOUNTER — Other Ambulatory Visit: Payer: Self-pay | Admitting: Psychiatry

## 2024-01-15 DIAGNOSIS — R200 Anesthesia of skin: Secondary | ICD-10-CM | POA: Diagnosis not present

## 2024-01-15 DIAGNOSIS — F3341 Major depressive disorder, recurrent, in partial remission: Secondary | ICD-10-CM

## 2024-01-15 DIAGNOSIS — F411 Generalized anxiety disorder: Secondary | ICD-10-CM

## 2024-01-17 ENCOUNTER — Telehealth: Payer: Self-pay

## 2024-01-17 DIAGNOSIS — F3341 Major depressive disorder, recurrent, in partial remission: Secondary | ICD-10-CM

## 2024-01-17 MED ORDER — BREXPIPRAZOLE 0.25 MG PO TABS: 0.2500 mg | ORAL_TABLET | Freq: Every day | ORAL | 2 refills | Status: DC

## 2024-01-17 NOTE — Telephone Encounter (Signed)
 I have sent Rexulti  0.25 mg - 30 tablets to pharmacy.

## 2024-01-17 NOTE — Telephone Encounter (Signed)
 Pt.notified

## 2024-01-17 NOTE — Telephone Encounter (Signed)
 mandy at [facility] called states that the rx that was sent was for .5 mg of the rexulti . pt states she take .25mg  a rx for the .25 need to be sent. #30. they will not accept the rx that was sent.

## 2024-01-30 DIAGNOSIS — R519 Headache, unspecified: Secondary | ICD-10-CM | POA: Diagnosis not present

## 2024-01-30 DIAGNOSIS — R55 Syncope and collapse: Secondary | ICD-10-CM | POA: Diagnosis not present

## 2024-01-30 DIAGNOSIS — E538 Deficiency of other specified B group vitamins: Secondary | ICD-10-CM | POA: Diagnosis not present

## 2024-01-30 DIAGNOSIS — R200 Anesthesia of skin: Secondary | ICD-10-CM | POA: Diagnosis not present

## 2024-01-30 DIAGNOSIS — R569 Unspecified convulsions: Secondary | ICD-10-CM | POA: Diagnosis not present

## 2024-02-01 DIAGNOSIS — I10 Essential (primary) hypertension: Secondary | ICD-10-CM | POA: Diagnosis not present

## 2024-02-01 DIAGNOSIS — E782 Mixed hyperlipidemia: Secondary | ICD-10-CM | POA: Diagnosis not present

## 2024-02-01 DIAGNOSIS — E1165 Type 2 diabetes mellitus with hyperglycemia: Secondary | ICD-10-CM | POA: Diagnosis not present

## 2024-02-01 DIAGNOSIS — R160 Hepatomegaly, not elsewhere classified: Secondary | ICD-10-CM | POA: Diagnosis not present

## 2024-02-01 DIAGNOSIS — R531 Weakness: Secondary | ICD-10-CM | POA: Diagnosis not present

## 2024-02-01 DIAGNOSIS — Z8659 Personal history of other mental and behavioral disorders: Secondary | ICD-10-CM | POA: Diagnosis not present

## 2024-02-01 DIAGNOSIS — R55 Syncope and collapse: Secondary | ICD-10-CM | POA: Diagnosis not present

## 2024-02-05 ENCOUNTER — Ambulatory Visit (INDEPENDENT_AMBULATORY_CARE_PROVIDER_SITE_OTHER): Payer: Medicare PPO

## 2024-02-05 DIAGNOSIS — E538 Deficiency of other specified B group vitamins: Secondary | ICD-10-CM | POA: Diagnosis not present

## 2024-02-05 MED ORDER — CYANOCOBALAMIN 1000 MCG/ML IJ SOLN
1000.0000 ug | Freq: Once | INTRAMUSCULAR | Status: AC
  Administered 2024-02-05: 1000 ug via INTRAMUSCULAR

## 2024-02-05 NOTE — Progress Notes (Signed)
 Patient presented for B 12 injection to left deltoid, patient voiced no concerns nor showed any signs of distress during injection.

## 2024-02-06 ENCOUNTER — Other Ambulatory Visit: Payer: Self-pay | Admitting: Neurology

## 2024-02-06 DIAGNOSIS — R55 Syncope and collapse: Secondary | ICD-10-CM

## 2024-02-09 ENCOUNTER — Ambulatory Visit
Admission: RE | Admit: 2024-02-09 | Discharge: 2024-02-09 | Disposition: A | Discharge status: Home / Self Care | Payer: Medicare PPO | Source: Ambulatory Visit | Attending: Neurology | Admitting: Neurology

## 2024-02-09 ENCOUNTER — Other Ambulatory Visit: Payer: Self-pay | Admitting: Psychiatry

## 2024-02-09 DIAGNOSIS — F411 Generalized anxiety disorder: Secondary | ICD-10-CM

## 2024-02-09 DIAGNOSIS — R55 Syncope and collapse: Secondary | ICD-10-CM | POA: Diagnosis not present

## 2024-02-09 DIAGNOSIS — R519 Headache, unspecified: Secondary | ICD-10-CM | POA: Diagnosis not present

## 2024-02-09 DIAGNOSIS — F3341 Major depressive disorder, recurrent, in partial remission: Secondary | ICD-10-CM

## 2024-02-13 DIAGNOSIS — D2261 Melanocytic nevi of right upper limb, including shoulder: Secondary | ICD-10-CM | POA: Diagnosis not present

## 2024-02-13 DIAGNOSIS — D2262 Melanocytic nevi of left upper limb, including shoulder: Secondary | ICD-10-CM | POA: Diagnosis not present

## 2024-02-13 DIAGNOSIS — D225 Melanocytic nevi of trunk: Secondary | ICD-10-CM | POA: Diagnosis not present

## 2024-02-13 DIAGNOSIS — L565 Disseminated superficial actinic porokeratosis (DSAP): Secondary | ICD-10-CM | POA: Diagnosis not present

## 2024-02-13 DIAGNOSIS — D2272 Melanocytic nevi of left lower limb, including hip: Secondary | ICD-10-CM | POA: Diagnosis not present

## 2024-02-13 DIAGNOSIS — L821 Other seborrheic keratosis: Secondary | ICD-10-CM | POA: Diagnosis not present

## 2024-02-13 DIAGNOSIS — D2271 Melanocytic nevi of right lower limb, including hip: Secondary | ICD-10-CM | POA: Diagnosis not present

## 2024-02-14 ENCOUNTER — Ambulatory Visit: Payer: Medicare PPO | Admitting: Internal Medicine

## 2024-02-14 VITALS — BP 122/74 | HR 84 | Temp 98.0°F | Resp 16 | Ht 63.0 in | Wt 125.8 lb

## 2024-02-14 DIAGNOSIS — F3341 Major depressive disorder, recurrent, in partial remission: Secondary | ICD-10-CM | POA: Diagnosis not present

## 2024-02-14 DIAGNOSIS — R55 Syncope and collapse: Secondary | ICD-10-CM | POA: Diagnosis not present

## 2024-02-14 DIAGNOSIS — E1165 Type 2 diabetes mellitus with hyperglycemia: Secondary | ICD-10-CM

## 2024-02-14 DIAGNOSIS — I10 Essential (primary) hypertension: Secondary | ICD-10-CM

## 2024-02-14 DIAGNOSIS — E7800 Pure hypercholesterolemia, unspecified: Secondary | ICD-10-CM | POA: Diagnosis not present

## 2024-02-14 NOTE — Progress Notes (Signed)
 Subjective:    Patient ID: Angelica Robertson, female    DOB: [birth year], 78 y.o.   MRN: [id number]  Patient here for  Chief Complaint  Patient presents with   Medical Management of Chronic Issues    HPI Here for a scheduled follow up. Recently evaluated for syncopal episode. Seeing Dr Juliann Pares. Is s/p carotid dopplers, calcium score and echo. Holter unremarkable. Carotid dopplers - negative. Echo unremarkable. Calcium score 28. Last evaluated 02/01/24 - recommended to hold losartan and her cholesterol medication. Saw neurology 01/30/24 - recommended EEG and MRI. Had MRI 02/09/24 - results pending. Increased stress related to increased testing and regarding stopping medications. Reports has noticed an increase in her blood pressure recently. Husband reports that it has made her anxious about stopping the medication and feels may be contributing to her elevated blood pressure. No chest pain. Breathing stable. Not exercising as much.    Past Medical History:  Diagnosis Date   Depression 2000   Heart murmur    Hypercholesterolemia Osteoporosis   Hyperlipemia    Hypertension    Past Surgical History:  Procedure Laterality Date   ABDOMINAL HYSTERECTOMY  1980   COLONOSCOPY WITH PROPOFOL N/A 03/05/2018   Procedure: COLONOSCOPY WITH PROPOFOL;  Surgeon: Toledo, Boykin Nearing, MD;  Location: [facility] ENDOSCOPY;  Service: Endoscopy;  Laterality: N/A;   COLONOSCOPY WITH PROPOFOL N/A 04/23/2023   Procedure: COLONOSCOPY WITH PROPOFOL;  Surgeon: Regis Bill, MD;  Location: [facility] ENDOSCOPY;  Service: Endoscopy;  Laterality: N/A;   Family History  Problem Relation Age of Onset   Heart disease Mother    Diabetes Mother    Heart disease Father    Diabetes Father    Colon cancer Other    Congestive Heart Failure Brother    Colon cancer Brother    Mental illness Paternal Aunt    BRCA 1/2 Neg Hx    Breast cancer Neg Hx    Cowden syndrome Neg Hx    DES usage Neg Hx    Endometrial cancer Neg Hx     Li-Fraumeni syndrome Neg Hx    Ovarian cancer Neg Hx    Social History   Socioeconomic History   Marital status: Married    Spouse name: robert   Number of children: 2   Years of education: Not on file   Highest education level: 12th grade  Occupational History   Not on file  Tobacco Use   Smoking status: Never   Smokeless tobacco: Never  Vaping Use   Vaping status: Never Used  Substance and Sexual Activity   Alcohol use: Yes    Alcohol/week: 0.0 standard drinks of alcohol    Comment: occasionally - socially during the summer a glass of wine or beer   Drug use: No   Sexual activity: Yes  Other Topics Concern   Not on file  Social History Narrative   Not on file   Social Drivers of Health   Financial Resource Strain: Low Risk  (12/17/2023)   Overall Financial Resource Strain (CARDIA)    Difficulty of Paying Living Expenses: Not hard at all  Food Insecurity: No Food Insecurity (12/17/2023)   Hunger Vital Sign    Worried About Running Out of Food in the Last Year: Never true    Ran Out of Food in the Last Year: Never true  Transportation Needs: No Transportation Needs (12/17/2023)   PRAPARE - Administrator, Civil Service (Medical): No    Lack of Transportation (Non-Medical): No  Physical Activity: Inactive (12/17/2023)   Exercise Vital Sign    Days of Exercise per Week: 0 days    Minutes of Exercise per Session: 30 min  Stress: Stress Concern Present (12/17/2023)   [employer] of Occupational Health - Occupational Stress Questionnaire    Feeling of Stress : Rather much  Social Connections: Socially Integrated (12/17/2023)   Social Connection and Isolation Panel [NHANES]    Frequency of Communication with Friends and Family: Twice a week    Frequency of Social Gatherings with Friends and Family: More than three times a week    Attends Religious Services: More than 4 times per year    Active Member of [employer] or Organizations: Yes    Attends Hospital doctor: More than 4 times per year    Marital Status: Married     Review of Systems  Constitutional:  Negative for appetite change and unexpected weight change.  HENT:  Negative for congestion and sinus pressure.   Respiratory:  Negative for cough, chest tightness and shortness of breath.   Cardiovascular:  Negative for chest pain, palpitations and leg swelling.  Gastrointestinal:  Negative for abdominal pain, diarrhea, nausea and vomiting.  Genitourinary:  Negative for difficulty urinating and dysuria.  Musculoskeletal:  Negative for joint swelling and myalgias.  Skin:  Negative for color change and rash.  Neurological:  Negative for dizziness.  Psychiatric/Behavioral:         Increased stress. Depression. No SI.        Objective:     BP 122/74   Pulse 84   Temp 98 F (36.7 C)   Resp 16   Ht 5\' 3"  (1.6 m)   Wt 125 lb 12.8 oz (57.1 kg)   LMP 11/27/1979   SpO2 98%   BMI 22.28 kg/m  Wt Readings from Last 3 Encounters:  02/14/24 125 lb 12.8 oz (57.1 kg)  01/03/24 124 lb 9.6 oz (56.5 kg)  12/20/23 124 lb (56.2 kg)    Physical Exam Vitals reviewed.  Constitutional:      General: She is not in acute distress.    Appearance: Normal appearance.  HENT:     Head: Normocephalic and atraumatic.     Right Ear: External ear normal.     Left Ear: External ear normal.     Mouth/Throat:     Pharynx: No oropharyngeal exudate or posterior oropharyngeal erythema.  Eyes:     General: No scleral icterus.       Right eye: No discharge.        Left eye: No discharge.     Conjunctiva/sclera: Conjunctivae normal.  Neck:     Thyroid: No thyromegaly.  Cardiovascular:     Rate and Rhythm: Normal rate and regular rhythm.  Pulmonary:     Effort: No respiratory distress.     Breath sounds: Normal breath sounds. No wheezing.  Abdominal:     General: Bowel sounds are normal.     Palpations: Abdomen is soft.     Tenderness: There is no abdominal tenderness.  Musculoskeletal:         General: No swelling or tenderness.     Cervical back: Neck supple. No tenderness.  Lymphadenopathy:     Cervical: No cervical adenopathy.  Skin:    Findings: No erythema or rash.  Neurological:     Mental Status: She is alert.  Psychiatric:        Mood and Affect: Mood normal.  Behavior: Behavior normal.         Outpatient Encounter Medications as of 02/14/2024  Medication Sig   brexpiprazole (REXULTI) 0.25 MG TABS tablet Take 1 tablet (0.25 mg total) by mouth daily.   calcium carbonate (OS-CAL) 600 MG TABS tablet Take 600 mg by mouth 2 (two) times daily with a meal.   escitalopram (LEXAPRO) 20 MG tablet Take 1 tablet (20 mg total) by mouth daily.   losartan (COZAAR) 25 MG tablet TAKE 1 TABLET BY MOUTH ONCE DAILY (Patient taking differently: Take 25 mg by mouth at bedtime.)   mirtazapine (REMERON) 15 MG tablet TAKE 1 and 1/2 TABLETS BY MOUTH AT BEDTIME . AS DIRECTED DOSE CHANGE ** STOP 30 MG   pantoprazole (PROTONIX) 20 MG tablet Take 1 tablet (20 mg total) by mouth daily.   rosuvastatin (CRESTOR) 20 MG tablet Take 1 tablet (20 mg total) by mouth at bedtime.   VITAMIN D PO Take by mouth daily.   No facility-administered encounter medications on file as of 02/14/2024.     Lab Results  Component Value Date   WBC 10.5 12/18/2023   HGB 13.8 12/18/2023   HCT 41.2 12/18/2023   PLT 365.0 12/18/2023   GLUCOSE 138 (H) 12/18/2023   CHOL 144 12/18/2023   TRIG 84.0 12/18/2023   HDL 64.60 12/18/2023   LDLCALC 63 12/18/2023   ALT 24 12/18/2023   AST 23 12/18/2023   NA 138 12/18/2023   K 4.2 12/18/2023   CL 99 12/18/2023   CREATININE 0.74 12/18/2023   BUN 18 12/18/2023   CO2 31 12/18/2023   TSH 1.74 12/20/2023   HGBA1C 6.5 12/18/2023   MICROALBUR <0.7 12/20/2023    No results found.     Assessment & Plan:  Syncope, unspecified syncope type Assessment & Plan: Recently evaluated for syncopal episode. Seeing Dr Juliann Pares. Is s/p carotid dopplers, calcium score and echo.  Holter unremarkable. Carotid dopplers - negative. Echo unremarkable. Calcium score 28. Last evaluated 02/01/24 - recommended to hold losartan and her cholesterol medication. Saw neurology 01/30/24 - recommended EEG and MRI. Had MRI 02/09/24 - results pending.   Hypercholesterolemia Assessment & Plan: Has been on crestor. Was advised to stop this as well. She desires not to stop. Continue crestor. Follow.   Orders: -     Lipid panel; Future -     Hepatic function panel; Future  Type 2 diabetes mellitus with hyperglycemia, without long-term current use of insulin (HCC) Assessment & Plan: Follow met b and A1c.   Lab Results  Component Value Date   HGBA1C 6.5 12/18/2023     Orders: -     Hemoglobin A1c; Future -     Basic metabolic panel; Future  MDD (major depressive disorder), recurrent, in partial remission [facility]) Assessment & Plan: Being followed by psychiatry. Increased stress as outlined. No SI. Discussed need for f/u with psychiatry.    Hypertension, unspecified type Assessment & Plan: Blood pressure as outlined.  Off amlodipine. Recent visit, cardiology recommended stopping losartan. This makes her anxious - to stop her medication. Will have her decrease to 1/2 tablet per day. Follow pressures.       Dale Sebeka, MD

## 2024-02-15 ENCOUNTER — Telehealth: Payer: Self-pay | Admitting: Internal Medicine

## 2024-02-15 NOTE — Telephone Encounter (Signed)
 Copied from CRM [id number]. Topic: General - Other >> Feb 14, 2024  3:42 PM Pascal Lux wrote: Reason for CRM: Patient called and stated she saw labs were collected after her visit but does not remember getting labs. Patient is requesting a call from nurse.

## 2024-02-15 NOTE — Telephone Encounter (Signed)
 Explained to patient that future lab orders were placed. No labs collected.

## 2024-02-17 ENCOUNTER — Encounter: Payer: Self-pay | Admitting: Internal Medicine

## 2024-02-17 NOTE — Assessment & Plan Note (Signed)
 Follow met b and A1c.   Lab Results  Component Value Date   HGBA1C 6.5 12/18/2023

## 2024-02-17 NOTE — Assessment & Plan Note (Signed)
 Recently evaluated for syncopal episode. Seeing Dr Juliann Pares. Is s/p carotid dopplers, calcium score and echo. Holter unremarkable. Carotid dopplers - negative. Echo unremarkable. Calcium score 28. Last evaluated 02/01/24 - recommended to hold losartan and her cholesterol medication. Saw neurology 01/30/24 - recommended EEG and MRI. Had MRI 02/09/24 - results pending.

## 2024-02-17 NOTE — Assessment & Plan Note (Signed)
 Blood pressure as outlined.  Off amlodipine. Recent visit, cardiology recommended stopping losartan. This makes her anxious - to stop her medication. Will have her decrease to 1/2 tablet per day. Follow pressures.

## 2024-02-17 NOTE — Assessment & Plan Note (Signed)
 Has been on crestor. Was advised to stop this as well. She desires not to stop. Continue crestor. Follow.

## 2024-02-17 NOTE — Assessment & Plan Note (Signed)
 Being followed by psychiatry. Increased stress as outlined. No SI. Discussed need for f/u with psychiatry.

## 2024-02-18 DIAGNOSIS — F331 Major depressive disorder, recurrent, moderate: Secondary | ICD-10-CM

## 2024-02-20 MED ORDER — REXULTI 0.5 MG PO TABS: 0.5000 mg | ORAL_TABLET | Freq: Every day | ORAL | 1 refills | Status: DC

## 2024-02-27 ENCOUNTER — Telehealth: Payer: Self-pay | Admitting: Internal Medicine

## 2024-02-27 NOTE — Telephone Encounter (Signed)
 Angelica Robertson, I had seen Angelica Robertson recently. It appears her medication has been adjusted. Please call her and confirm she is doing ok.  Thanks

## 2024-02-28 DIAGNOSIS — F4323 Adjustment disorder with mixed anxiety and depressed mood: Secondary | ICD-10-CM | POA: Diagnosis not present

## 2024-02-28 DIAGNOSIS — F331 Major depressive disorder, recurrent, moderate: Secondary | ICD-10-CM | POA: Diagnosis not present

## 2024-02-28 NOTE — Telephone Encounter (Signed)
 FYI  Called patient. She says she is feeling about the same but confirmed ok. Dr Elna Breslow increased her rexulti but she says she hasn't been on this dose long enough to tell a difference yet. Will continue to monitor. Patient stated nothing further needed at this time.

## 2024-03-03 ENCOUNTER — Encounter: Payer: Self-pay | Admitting: Psychiatry

## 2024-03-03 ENCOUNTER — Telehealth (INDEPENDENT_AMBULATORY_CARE_PROVIDER_SITE_OTHER): Payer: Medicare PPO | Admitting: Psychiatry

## 2024-03-03 DIAGNOSIS — F411 Generalized anxiety disorder: Secondary | ICD-10-CM

## 2024-03-03 DIAGNOSIS — F331 Major depressive disorder, recurrent, moderate: Secondary | ICD-10-CM

## 2024-03-03 MED ORDER — REXULTI 0.5 MG PO TABS
0.5000 mg | ORAL_TABLET | ORAL | Status: DC
Start: 2024-03-03 — End: 2024-04-15

## 2024-03-03 MED ORDER — LAMOTRIGINE 25 MG PO TABS: ORAL_TABLET | ORAL | 0 refills | Status: DC

## 2024-03-03 NOTE — Patient Instructions (Signed)
 Lamotrigine Tablets What is this medication? LAMOTRIGINE (la MOE Patrecia Pace) prevents and controls seizures in people with epilepsy. It may also be used to treat bipolar disorder. It works by calming overactive nerves in your body. This medicine may be used for other purposes; ask your health care provider or pharmacist if you have questions. COMMON BRAND NAME(S): Lamictal, Subvenite What should I tell my care team before I take this medication? They need to know if you have any of these conditions: Heart disease History of irregular heartbeat Immune system problems Kidney disease Liver disease Low levels of folic acid in the blood Lupus Mental health condition Suicidal thoughts, plans, or attempt by you or a family member An unusual or allergic reaction to lamotrigine, other medications, foods, dyes, or preservatives Pregnant or trying to get pregnant Breastfeeding How should I use this medication? Take this medication by mouth with a glass of water. Follow the directions on the prescription label. Do not chew these tablets. If this medication upsets your stomach, take it with food or milk. Take your doses at regular intervals. Do not take your medication more often than directed. A special MedGuide will be given to you by the pharmacist with each new prescription and refill. Be sure to read this information carefully each time. Talk to your care team about the use of this medication in children. While this medication may be prescribed for children as young as 2 years for selected conditions, precautions do apply. Overdosage: If you think you have taken too much of this medicine contact a poison control center or emergency room at once. NOTE: This medicine is only for you. Do not share this medicine with others. What if I miss a dose? If you miss a dose, take it as soon as you can. If it is almost time for your next dose, take only that dose. Do not take double or extra doses. What may  interact with this medication? Atazanavir Certain medications for irregular heartbeat Certain medications for seizures, such as carbamazepine, phenobarbital, phenytoin, primidone, or valproic acid Estrogen or progestin hormones Lopinavir Rifampin Ritonavir This list may not describe all possible interactions. Give your health care provider a list of all the medicines, herbs, non-prescription drugs, or dietary supplements you use. Also tell them if you smoke, drink alcohol, or use illegal drugs. Some items may interact with your medicine. What should I watch for while using this medication? Visit your care team for regular checks on your progress. If you take this medication for seizures, wear a Medic Alert bracelet or necklace. Carry an identification card with information about your condition, medications, and care team. It is important to take this medication exactly as directed. When first starting treatment, your dose will need to be adjusted slowly. It may take weeks or months before your dose is stable. You should contact your care team if your seizures get worse or if you have any new types of seizures. Do not stop taking this medication unless instructed by your care team. Stopping your medication suddenly can increase your seizures or their severity. This medication may cause serious skin reactions. They can happen weeks to months after starting the medication. Contact your care team right away if you notice fevers or flu-like symptoms with a rash. The rash may be red or purple and then turn into blisters or peeling of the skin. You may also notice a red rash with swelling of the face, lips, or lymph nodes in your neck or under your  arms. This medication may affect your coordination, reaction time, or judgment. Do not drive or operate machinery until you know how this medication affects you. Sit up or stand slowly to reduce the risk of dizzy or fainting spells. Drinking alcohol with this  medication can increase the risk of these side effects. If you are taking this medication for bipolar disorder, it is important to report any changes in your mood to your care team. If your condition gets worse, you get mentally depressed, feel very hyperactive or manic, have difficulty sleeping, or have thoughts of hurting yourself or committing suicide, you need to get help from your care team right away. If you are a caregiver for someone taking this medication for bipolar disorder, you should also report these behavioral changes right away. The use of this medication may increase the chance of suicidal thoughts or actions. Pay special attention to how you are responding while on this medication. Your mouth may get dry. Chewing sugarless gum or sucking hard candy and drinking plenty of water may help. Contact your care team if the problem does not go away or is severe. If you become pregnant while using this medication, you may enroll in the [place] American Antiepileptic Drug Pregnancy Registry by calling [phone]. This registry collects information about the safety of antiepileptic medication use during pregnancy. This medication may cause a decrease in folic acid. You should make sure that you get enough folic acid while you are taking this medication. Discuss the foods you eat and the vitamins you take with your care team. What side effects may I notice from receiving this medication? Side effects that you should report to your care team as soon as possible: Allergic reactions--skin rash, itching, hives, swelling of the face, lips, tongue, or throat Change in vision Fever, neck pain or stiffness, sensitivity to light, headache, nausea, vomiting, confusion, which may be signs of meningitis Fever, rash, swollen lymph nodes, confusion, trouble walking, loss of balance or coordination, seizures Heart rhythm changes--fast or irregular heartbeat, dizziness, feeling faint or lightheaded, chest pain,  trouble breathing Infection--fever, chills, cough, or sore throat Low red blood cell level--unusual weakness or fatigue, dizziness, headache, trouble breathing Rash, fever, and swollen lymph nodes Redness, blistering, peeling, or loosening of the skin, including inside the mouth Thoughts of suicide or self-harm, worsening mood, feelings of depression Unusual bruising or bleeding Side effects that usually do not require medical attention (report these to your care team if they continue or are bothersome): Diarrhea Dizziness Drowsiness Headache Nausea Stomach pain Tremors or shaking This list may not describe all possible side effects. Call your doctor for medical advice about side effects. You may report side effects to FDA at 1-800-FDA-1088. Where should I keep my medication? Keep out of the reach of children and pets. Store at [employer] C (77 degrees F). Protect from light. Get rid of any unused medication after the expiration date. To get rid of medications that are no longer needed or have expired: Take the medication to a medication take-back program. Check with your pharmacy or law enforcement to find a location. If you cannot return the medication, check the label or package insert to see if the medication should be thrown out in the garbage or flushed down the toilet. If you are not sure, ask your care team. If it is safe to put it in the trash, empty the medication out of the container. Mix the medication with cat litter, dirt, coffee grounds, or other unwanted substance.  Seal the mixture in a bag or container. Put it in the trash. NOTE: This sheet is a summary. It may not cover all possible information. If you have questions about this medicine, talk to your doctor, pharmacist, or health care provider.  2024 Elsevier/Gold Standard (2023-11-09 00:00:00)

## 2024-03-03 NOTE — Progress Notes (Unsigned)
 Virtual Visit via Video Note  I connected with Angelica Robertson on 03/03/24 at  3:30 PM EDT by a video enabled telemedicine application and verified that I am speaking with the correct person using two identifiers.  Location Provider Location : [facility] Patient Location : Home  Participants: Patient ,Spouse, Provider    I discussed the limitations of evaluation and management by telemedicine and the availability of in person appointments. The patient expressed understanding and agreed to proceed.   I discussed the assessment and treatment plan with the patient. The patient was provided an opportunity to ask questions and all were answered. The patient agreed with the plan and demonstrated an understanding of the instructions.   The patient was advised to call back or seek an in-person evaluation if the symptoms worsen or if the condition fails to improve as anticipated.   BH MD OP Progress Note  03/04/2024 3:34 PM Angelica Robertson  MRN:  [id number]  Chief Complaint:  Chief Complaint  Patient presents with   Follow-up   Anxiety   Depression   Medication Refill   HPI: Angelica Robertson is a 78 year old Caucasian female, married, retired, lives in [place], has a history of MDD, GAD, hyperlipidemia, arthritis, chronic headaches was evaluated by telemedicine today.   She experiences headaches almost all day long, which likely began after increasing the dose of Rexulti to 0.5 mg on February 18, 2024. Tylenol provides relief, but she requires it daily, taking one dose of extra strength Tylenol each day.  She has a history of major depression and generalized anxiety and is currently taking mirtazapine 22.5 mg, Lexapro 20 mg, and Rexulti 0.5 mg. Her depression symptoms have not improved since increasing the Rexulti dose, and her husband has noticed worsening depression and anxiety since the past several weeks.  As per collateral information from husband Mr. Molly Maduro, patient seems to be pacing, anxious a lot  recently.  She does have a lot going on medically since she is currently under the care of cardiology and neurology.    She has undergone multiple tests due to previous falls and syncope, including a brain CT, brain MRI, and cardiac evaluations. She has a follow-up appointment for an EEG. A CT scan revealed something on her liver, prompting a referral to a gastroenterologist. She has not experienced any more falls or syncope since December.  She reports sleeping well through the night and eating adequately. She attended a home group meeting recently, which she found challenging but managed to get through.  She currently denies any suicidality or homicidality.  Denies any perceptual disturbances.  She appeared to be alert, oriented to self and situation.  Was able to answer questions appropriately.  Continues to be in psychotherapy and reports therapy sessions are going well.    Visit Diagnosis:    ICD-10-CM   1. MDD (major depressive disorder), recurrent episode, moderate (HCC)  F33.1 lamoTRIgine (LAMICTAL) 25 MG tablet    Brexpiprazole (REXULTI) 0.5 MG TABS    2. GAD (generalized anxiety disorder)  F41.1 lamoTRIgine (LAMICTAL) 25 MG tablet      Past Psychiatric History: I have reviewed past psychiatric history from progress note on 01/12/2020. Previously attempted TMS-07/17/2022-did not tolerate it. Past trials of medications like duloxetine, Zoloft, Wellbutrin, Klonopin, Seroquel, trazodone, zolpidem. Inpatient behavioral health admissions multiple in the past-most recent-old [facility]-09/22/2023 - 09/30/2023.  Past Medical History:  Past Medical History:  Diagnosis Date   Depression 2000   Heart murmur    Hypercholesterolemia Osteoporosis  Hyperlipemia    Hypertension     Past Surgical History:  Procedure Laterality Date   ABDOMINAL HYSTERECTOMY  1980   COLONOSCOPY WITH PROPOFOL N/A 03/05/2018   Procedure: COLONOSCOPY WITH PROPOFOL;  Surgeon: Toledo, Boykin Nearing, MD;   Location: [facility] ENDOSCOPY;  Service: Endoscopy;  Laterality: N/A;   COLONOSCOPY WITH PROPOFOL N/A 04/23/2023   Procedure: COLONOSCOPY WITH PROPOFOL;  Surgeon: Regis Bill, MD;  Location: [facility] ENDOSCOPY;  Service: Endoscopy;  Laterality: N/A;    Family Psychiatric History: Reviewed family psychiatric history from progress note on 01/12/2020.  Family History:  Family History  Problem Relation Age of Onset   Heart disease Mother    Diabetes Mother    Heart disease Father    Diabetes Father    Colon cancer Other    Congestive Heart Failure Brother    Colon cancer Brother    Mental illness Paternal Aunt    BRCA 1/2 Neg Hx    Breast cancer Neg Hx    Cowden syndrome Neg Hx    DES usage Neg Hx    Endometrial cancer Neg Hx    Li-Fraumeni syndrome Neg Hx    Ovarian cancer Neg Hx     Social History: Reviewed social history from progress note on 01/12/2020. Social History   Socioeconomic History   Marital status: Married    Spouse name: robert   Number of children: 2   Years of education: Not on file   Highest education level: 12th grade  Occupational History   Not on file  Tobacco Use   Smoking status: Never   Smokeless tobacco: Never  Vaping Use   Vaping status: Never Used  Substance and Sexual Activity   Alcohol use: Yes    Alcohol/week: 0.0 standard drinks of alcohol    Comment: occasionally - socially during the summer a glass of wine or beer   Drug use: No   Sexual activity: Yes  Other Topics Concern   Not on file  Social History Narrative   Not on file   Social Drivers of Health   Financial Resource Strain: Low Risk  (12/17/2023)   Overall Financial Resource Strain (CARDIA)    Difficulty of Paying Living Expenses: Not hard at all  Food Insecurity: No Food Insecurity (12/17/2023)   Hunger Vital Sign    Worried About Running Out of Food in the Last Year: Never true    Ran Out of Food in the Last Year: Never true  Transportation Needs: No Transportation Needs  (12/17/2023)   PRAPARE - Administrator, Civil Service (Medical): No    Lack of Transportation (Non-Medical): No  Physical Activity: Inactive (12/17/2023)   Exercise Vital Sign    Days of Exercise per Week: 0 days    Minutes of Exercise per Session: 30 min  Stress: Stress Concern Present (12/17/2023)   [employer] of Occupational Health - Occupational Stress Questionnaire    Feeling of Stress : Rather much  Social Connections: Socially Integrated (12/17/2023)   Social Connection and Isolation Panel [NHANES]    Frequency of Communication with Friends and Family: Twice a week    Frequency of Social Gatherings with Friends and Family: More than three times a week    Attends Religious Services: More than 4 times per year    Active Member of [employer] or Organizations: Yes    Attends Engineer, structural: More than 4 times per year    Marital Status: Married    Allergies: No Known  Allergies  Metabolic Disorder Labs: Lab Results  Component Value Date   HGBA1C 6.5 12/18/2023   No results found for: "PROLACTIN" Lab Results  Component Value Date   CHOL 144 12/18/2023   TRIG 84.0 12/18/2023   HDL 64.60 12/18/2023   CHOLHDL 2 12/18/2023   VLDL 16.8 12/18/2023   LDLCALC 63 12/18/2023   LDLCALC 38 08/10/2023   Lab Results  Component Value Date   TSH 1.74 12/20/2023   TSH 1.860 08/10/2023    Therapeutic Level Labs: No results found for: "LITHIUM" No results found for: "VALPROATE" No results found for: "CBMZ"  Current Medications: Current Outpatient Medications  Medication Sig Dispense Refill   lamoTRIgine (LAMICTAL) 25 MG tablet Take 1 tablet (25 mg total) by mouth daily for 15 days, THEN 1 tablet (25 mg total) 2 (two) times daily for 15 days. 45 tablet 0   Brexpiprazole (REXULTI) 0.5 MG TABS Take 1 tablet (0.5 mg total) by mouth as directed. Take 0.5 mg every other day and rest of the day 0.25 mg     calcium carbonate (OS-CAL) 600 MG TABS tablet Take 600 mg by  mouth 2 (two) times daily with a meal.     escitalopram (LEXAPRO) 20 MG tablet Take 1 tablet (20 mg total) by mouth daily. 90 tablet 3   losartan (COZAAR) 25 MG tablet TAKE 1 TABLET BY MOUTH ONCE DAILY (Patient taking differently: Take 25 mg by mouth at bedtime.) 90 tablet 1   mirtazapine (REMERON) 15 MG tablet TAKE 1 and 1/2 TABLETS BY MOUTH AT BEDTIME . AS DIRECTED DOSE CHANGE ** STOP 30 MG 45 tablet 1   pantoprazole (PROTONIX) 20 MG tablet Take 1 tablet (20 mg total) by mouth daily. 90 tablet 1   rosuvastatin (CRESTOR) 20 MG tablet Take 1 tablet (20 mg total) by mouth at bedtime. 90 tablet 3   VITAMIN D PO Take by mouth daily.     No current facility-administered medications for this visit.     Musculoskeletal: Strength & Muscle Tone:  UTA Gait & Station:  Seated Patient leans: N/A  Psychiatric Specialty Exam: Review of Systems  Neurological:  Positive for headaches.  Psychiatric/Behavioral:  Positive for dysphoric mood. The patient is nervous/anxious.     Last menstrual period 11/27/1979.There is no height or weight on file to calculate BMI.  General Appearance: Casual  Eye Contact:  Fair  Speech:  Clear and Coherent  Volume:  Normal  Mood:  Anxious and Depressed  Affect:  Congruent  Thought Process:  Goal Directed and Descriptions of Associations: Intact  Orientation:  Full (Time, Place, and Person)  Thought Content: Logical   Suicidal Thoughts:  No  Homicidal Thoughts:  No  Memory:  Immediate;   Fair Recent;   Fair Remote;   Limited  Judgement:  Fair  Insight:  Fair  Psychomotor Activity:  Normal  Concentration:  Concentration: Fair and Attention Span: Fair  Recall:  [employer] of Knowledge: Fair  Language: Fair  Akathisia:  No  Handed:  Right  AIMS (if indicated): not done  Assets:  Desire for Improvement Housing Social Support  ADL's:  Intact  Cognition: WNL  Sleep:  Fair   Screenings: Midwife Visit from 10/02/2023 in [facility] Office Visit from 08/28/2023 in [facility] Office Visit from 06/20/2022 in [facility] Office Visit from 04/24/2022 in [facility]  AIMS Total Score 0 0 0 0      AUDIT    Flowsheet Row Admission (Discharged) from 05/12/2022 in [facility] Admission (Discharged) from 12/17/2019 in [facility] INPATIENT BE[facility]AL MEDICINE Admission (Discharged) from [facility]  Alcohol Use Disorder Identification Test Final Score (AUDIT) 0 0 0      GAD-7    Flowsheet Row Office Visit from 01/03/2024 in [facility] Video Visit from 11/26/2023 in [facility] Video Visit from 10/19/2023 in [facility] Office Visit from 10/02/2023 in [facility] Office Visit from 09/04/2023 in [facility]  Total GAD-7 Score 3 3 9 9 12       Mini-Mental    Flowsheet Row Clinical Support from 02/27/2018 in [facility] Clinical Support from 02/02/2017 in [facility] Clinical Support from 02/03/2016 in [facility]  Total Score (max 30 points ) 30 30 30       PHQ2-9    Flowsheet Row Office Visit from 02/14/2024 in [facility] Video Visit from 01/14/2024 in [facility] Office Visit from 01/03/2024 in [facility] Visit from 12/20/2023 in [name] Video Visit from 11/26/2023 in [facility]  PHQ-2 Total Score 4 2 2 3 1   PHQ-9 Total Score 11 7 8 11  --      Flowsheet Row Video Visit from 03/03/2024 in [facility] Video Visit from 01/14/2024 in [facility] Video Visit from 11/26/2023 in [facility]  C-SSRS RISK CATEGORY No Risk No Risk No Risk        Assessment and Plan: TYLICIA SHERMAN is a 78 year old Caucasian female, married, retired, lives in [place], has a history of MDD, anxiety, hyperlipidemia was evaluated by telemedicine today.  Discussed assessment and plan as noted below.  Major Depression-unstable There is a lack of improvement in depression symptoms despite the recent increase in Rexulti dosage. Increased depression and anxiety symptoms, including pacing and hand-wringing, have been observed. Lamictal is proposed as an adjunct treatment to help with depression and anxiety, with the added benefit of potentially alleviating headaches. The risk of a serious rash and heart palpitations are a potential side effect. - Add Lamictal 25 mg daily for 15 days, then increase to 25 mg twice daily. - Consider reducing Lexapro or Mirtazapine if Lamictal is effective. - Continue Lexapro 20 mg daily for now. - Continue Mirtazapine 22.5 mg at bedtime, reduced dosage - Continue Rexulti however changed to taking 0.25 mg and 0.5 mg on alternate days. - Continue therapy sessions. - Schedule follow-up appointment on May 5 at 9:30 AM by video.  Generalized anxiety disorder-unstable Increased anxiety is present due to multiple ongoing medical evaluations. Xanax is available but has not been used. It is recommended for acute anxiety but should not be used daily to avoid habit formation. - Use Xanax 0.25 mg as needed for acute anxiety, but not daily. - Continue therapy sessions with Ms. Victorino Dike at [facility]. - Continue Lexapro and Mirtazapine as  prescribed.  Reviewed labs-dated 12/18/2023-platelet count-within normal limits, sodium level-within normal limits.   Collateral  information obtained from spouse who was present in session.  Follow-up Multiple medical evaluations are ongoing, including neurology and cardiology assessments. Follow-up is needed for recent MRI findings and liver findings from a CT scan. - Follow up with neurologist regarding MRI findings and EEG. - Follow up with gastroenterologist  - Schedule follow-up appointment on May 5 at 9:30 AM by video.   Collaboration of Care: Collaboration of Care: Referral or follow-up with counselor/therapist AEB patient encouraged to continue CBT  Patient/Guardian was advised Release of Information must be obtained prior to any record release in order to collaborate their care with an outside provider. Patient/Guardian was advised if they have not already done so to contact the registration department to sign all necessary forms in order for [place] to release information regarding their care.   Consent: Patient/Guardian gives verbal consent for treatment and assignment of benefits for services provided during this visit. Patient/Guardian expressed understanding and agreed to proceed.  Discussed the use of a AI scribe software for clinical note transcription with the patient, who gave verbal consent to proceed.  This note was generated in part or whole with voice recognition software. Voice recognition is usually quite accurate but there are transcription errors that can and very often do occur. I apologize for any typographical errors that were not detected and corrected.     Jomarie Longs, MD 03/04/2024, 3:34 PM

## 2024-03-04 ENCOUNTER — Ambulatory Visit (INDEPENDENT_AMBULATORY_CARE_PROVIDER_SITE_OTHER): Payer: Medicare PPO

## 2024-03-04 DIAGNOSIS — E538 Deficiency of other specified B group vitamins: Secondary | ICD-10-CM | POA: Diagnosis not present

## 2024-03-04 MED ORDER — CYANOCOBALAMIN 1000 MCG/ML IJ SOLN
1000.0000 ug | Freq: Once | INTRAMUSCULAR | Status: AC
  Administered 2024-03-04: 1000 ug via INTRAMUSCULAR

## 2024-03-04 NOTE — Progress Notes (Signed)
 Patient presented for B 12 injection to left deltoid, patient voiced no concerns nor showed any signs of distress during injection.

## 2024-03-05 ENCOUNTER — Other Ambulatory Visit: Payer: Self-pay | Admitting: Internal Medicine

## 2024-03-11 ENCOUNTER — Other Ambulatory Visit: Payer: Self-pay | Admitting: Psychiatry

## 2024-03-11 DIAGNOSIS — F3341 Major depressive disorder, recurrent, in partial remission: Secondary | ICD-10-CM

## 2024-03-11 DIAGNOSIS — R569 Unspecified convulsions: Secondary | ICD-10-CM | POA: Diagnosis not present

## 2024-03-11 DIAGNOSIS — R519 Headache, unspecified: Secondary | ICD-10-CM | POA: Diagnosis not present

## 2024-03-11 DIAGNOSIS — F411 Generalized anxiety disorder: Secondary | ICD-10-CM

## 2024-03-11 DIAGNOSIS — F332 Major depressive disorder, recurrent severe without psychotic features: Secondary | ICD-10-CM | POA: Diagnosis not present

## 2024-03-11 DIAGNOSIS — R55 Syncope and collapse: Secondary | ICD-10-CM | POA: Diagnosis not present

## 2024-03-12 DIAGNOSIS — K769 Liver disease, unspecified: Secondary | ICD-10-CM | POA: Diagnosis not present

## 2024-03-14 ENCOUNTER — Ambulatory Visit: Admitting: Internal Medicine

## 2024-03-14 ENCOUNTER — Encounter: Payer: Self-pay | Admitting: Internal Medicine

## 2024-03-14 VITALS — BP 120/76 | HR 68 | Temp 97.5°F | Ht 63.0 in | Wt 125.0 lb

## 2024-03-14 DIAGNOSIS — K219 Gastro-esophageal reflux disease without esophagitis: Secondary | ICD-10-CM

## 2024-03-14 DIAGNOSIS — F332 Major depressive disorder, recurrent severe without psychotic features: Secondary | ICD-10-CM | POA: Diagnosis not present

## 2024-03-14 DIAGNOSIS — R569 Unspecified convulsions: Secondary | ICD-10-CM | POA: Diagnosis not present

## 2024-03-14 DIAGNOSIS — I10 Essential (primary) hypertension: Secondary | ICD-10-CM | POA: Diagnosis not present

## 2024-03-14 DIAGNOSIS — K635 Polyp of colon: Secondary | ICD-10-CM

## 2024-03-14 DIAGNOSIS — R55 Syncope and collapse: Secondary | ICD-10-CM | POA: Diagnosis not present

## 2024-03-14 DIAGNOSIS — E7800 Pure hypercholesterolemia, unspecified: Secondary | ICD-10-CM | POA: Diagnosis not present

## 2024-03-14 DIAGNOSIS — E1165 Type 2 diabetes mellitus with hyperglycemia: Secondary | ICD-10-CM

## 2024-03-14 DIAGNOSIS — M542 Cervicalgia: Secondary | ICD-10-CM | POA: Diagnosis not present

## 2024-03-14 NOTE — Progress Notes (Signed)
 Subjective:    Patient ID: Angelica Robertson, female    DOB: January 20, 1946, 78 y.o.   MRN: [id number]  Patient here for  Chief Complaint  Patient presents with   Medical Management of Chronic Issues    HPI Here for a scheduled follow up - follow up regarding hypercholesterolemia, hypertension and recent syncopal episode.  Recently evaluated for syncopal episode. Seeing Dr Juliann Pares. Is s/p carotid dopplers, calcium score and echo. Holter unremarkable. Carotid dopplers - negative. Echo unremarkable. Calcium score 28. Last evaluated 02/01/24 - recommended to hold losartan and her cholesterol medication. She was anxious about stopping. Elected to continue crestor. Also, agreeable to 1/2 losartan.  Blood pressure doing well. Reviewed outside readings - blood pressure averaging - 111-130/60-70s. Has had worsening depression and anxiety. Evaluated by Dr Elna Breslow 03/03/24. Recommended to add lamictal. Continues on lexapro and mirtazapine. Continues on rexulti. Seeing neurology as well - syncope and headache. Planning for EEG - scheduled for 03/14/24. Had MRI brain 02/09/24 - No acute finding. Minimal small vessel change of the cerebral  hemispheric white matter, minimally progressive since 2019. Small region of dural thickening at the lateral right convexity with maximal thickness of 2 mm that could be the residua of a tiny subdural hematoma or could be a sessile meningioma, not present in 2019. Minimal subdural residua is favored. Recommended f/u MRI in one year. Discussed MRI follow up. Discussed testing. Increased anxiety with increased testing. Not getting out as much. Husband reports - she wants to stay in. Did sit outside some yesterday. Discussed getting out.    Past Medical History:  Diagnosis Date   Depression 2000   Heart murmur    Hypercholesterolemia Osteoporosis   Hyperlipemia    Hypertension    Past Surgical History:  Procedure Laterality Date   ABDOMINAL HYSTERECTOMY  1980   COLONOSCOPY WITH  PROPOFOL N/A 03/05/2018   Procedure: COLONOSCOPY WITH PROPOFOL;  Surgeon: Toledo, Boykin Nearing, MD;  Location: [facility] ENDOSCOPY;  Service: Endoscopy;  Laterality: N/A;   COLONOSCOPY WITH PROPOFOL N/A 04/23/2023   Procedure: COLONOSCOPY WITH PROPOFOL;  Surgeon: Regis Bill, MD;  Location: [facility] ENDOSCOPY;  Service: Endoscopy;  Laterality: N/A;   Family History  Problem Relation Age of Onset   Heart disease Mother    Diabetes Mother    Heart disease Father    Diabetes Father    Colon cancer Other    Congestive Heart Failure Brother    Colon cancer Brother    Mental illness Paternal Aunt    BRCA 1/2 Neg Hx    Breast cancer Neg Hx    Cowden syndrome Neg Hx    DES usage Neg Hx    Endometrial cancer Neg Hx    Li-Fraumeni syndrome Neg Hx    Ovarian cancer Neg Hx    Social History   Socioeconomic History   Marital status: Married    Spouse name: robert   Number of children: 2   Years of education: Not on file   Highest education level: 12th grade  Occupational History   Not on file  Tobacco Use   Smoking status: Never   Smokeless tobacco: Never  Vaping Use   Vaping status: Never Used  Substance and Sexual Activity   Alcohol use: Yes    Alcohol/week: 0.0 standard drinks of alcohol    Comment: occasionally - socially during the summer a glass of wine or beer   Drug use: No   Sexual activity: Yes  Other Topics Concern  Not on file  Social History Narrative   Not on file   Social Drivers of Health   Financial Resource Strain: Low Risk  (12/17/2023)   Overall Financial Resource Strain (CARDIA)    Difficulty of Paying Living Expenses: Not hard at all  Food Insecurity: No Food Insecurity (12/17/2023)   Hunger Vital Sign    Worried About Running Out of Food in the Last Year: Never true    Ran Out of Food in the Last Year: Never true  Transportation Needs: No Transportation Needs (12/17/2023)   PRAPARE - Administrator, Civil Service (Medical): No    Lack of  Transportation (Non-Medical): No  Physical Activity: Inactive (12/17/2023)   Exercise Vital Sign    Days of Exercise per Week: 0 days    Minutes of Exercise per Session: 30 min  Stress: Stress Concern Present (12/17/2023)   [employer] of Occupational Health - Occupational Stress Questionnaire    Feeling of Stress : Rather much  Social Connections: Socially Integrated (12/17/2023)   Social Connection and Isolation Panel [NHANES]    Frequency of Communication with Friends and Family: Twice a week    Frequency of Social Gatherings with Friends and Family: More than three times a week    Attends Religious Services: More than 4 times per year    Active Member of [employer] or Organizations: Yes    Attends Engineer, structural: More than 4 times per year    Marital Status: Married     Review of Systems  Constitutional:  Negative for unexpected weight change.       Is eating. Discussed nutritional supplements.   HENT:  Negative for congestion and sinus pressure.   Respiratory:  Negative for cough, chest tightness and shortness of breath.   Cardiovascular:  Negative for chest pain, palpitations and leg swelling.  Gastrointestinal:  Negative for abdominal pain, diarrhea, nausea and vomiting.  Genitourinary:  Negative for difficulty urinating and dysuria.  Musculoskeletal:  Negative for joint swelling and myalgias.  Skin:  Negative for color change and rash.  Neurological:  Negative for dizziness and headaches.  Psychiatric/Behavioral:  Negative for agitation and dysphoric mood.        Objective:     BP 120/76   Pulse 68   Temp (!) 97.5 F (36.4 C) (Oral)   Ht 5\' 3"  (1.6 m)   Wt 125 lb (56.7 kg)   LMP 11/27/1979   SpO2 97%   BMI 22.14 kg/m  Wt Readings from Last 3 Encounters:  03/14/24 125 lb (56.7 kg)  02/14/24 125 lb 12.8 oz (57.1 kg)  01/03/24 124 lb 9.6 oz (56.5 kg)    Physical Exam Vitals reviewed.  Constitutional:      General: She is not in acute distress.     Appearance: Normal appearance.  HENT:     Head: Normocephalic and atraumatic.     Right Ear: External ear normal.     Left Ear: External ear normal.  Eyes:     General: No scleral icterus.       Right eye: No discharge.        Left eye: No discharge.     Conjunctiva/sclera: Conjunctivae normal.  Neck:     Thyroid: No thyromegaly.  Cardiovascular:     Rate and Rhythm: Normal rate and regular rhythm.  Pulmonary:     Effort: No respiratory distress.     Breath sounds: Normal breath sounds. No wheezing.  Abdominal:  General: Bowel sounds are normal.     Palpations: Abdomen is soft.     Tenderness: There is no abdominal tenderness.  Musculoskeletal:        General: No swelling or tenderness.     Cervical back: Neck supple. No tenderness.  Lymphadenopathy:     Cervical: No cervical adenopathy.  Skin:    Findings: No erythema or rash.  Neurological:     Mental Status: She is alert.  Psychiatric:        Mood and Affect: Mood normal.        Behavior: Behavior normal.         Outpatient Encounter Medications as of 03/14/2024  Medication Sig   Brexpiprazole (REXULTI) 0.5 MG TABS Take 1 tablet (0.5 mg total) by mouth as directed. Take 0.5 mg every other day and rest of the day 0.25 mg   calcium carbonate (OS-CAL) 600 MG TABS tablet Take 600 mg by mouth 2 (two) times daily with a meal.   escitalopram (LEXAPRO) 20 MG tablet Take 1 tablet (20 mg total) by mouth daily.   lamoTRIgine (LAMICTAL) 25 MG tablet Take 1 tablet (25 mg total) by mouth daily for 15 days, THEN 1 tablet (25 mg total) 2 (two) times daily for 15 days.   losartan (COZAAR) 25 MG tablet TAKE 1 TABLET BY MOUTH ONCE DAILY   mirtazapine (REMERON) 15 MG tablet TAKE 1 and 1/2 TABLETS BY MOUTH AT BEDTIME . AS DIRECTED DOSE CHANGE ** STOP 30 MG   pantoprazole (PROTONIX) 20 MG tablet Take 1 tablet (20 mg total) by mouth daily.   rosuvastatin (CRESTOR) 20 MG tablet Take 1 tablet (20 mg total) by mouth at bedtime.   VITAMIN  D PO Take by mouth daily.   No facility-administered encounter medications on file as of 03/14/2024.     Lab Results  Component Value Date   WBC 10.5 12/18/2023   HGB 13.8 12/18/2023   HCT 41.2 12/18/2023   PLT 365.0 12/18/2023   GLUCOSE 138 (H) 12/18/2023   CHOL 144 12/18/2023   TRIG 84.0 12/18/2023   HDL 64.60 12/18/2023   LDLCALC 63 12/18/2023   ALT 24 12/18/2023   AST 23 12/18/2023   NA 138 12/18/2023   K 4.2 12/18/2023   CL 99 12/18/2023   CREATININE 0.74 12/18/2023   BUN 18 12/18/2023   CO2 31 12/18/2023   TSH 1.74 12/20/2023   HGBA1C 6.5 12/18/2023   MICROALBUR <0.7 12/20/2023    MR BRAIN WO CONTRAST Result Date: 03/03/2024 CLINICAL DATA:  Intermittent headache for several years. Recent syncopal episode. EXAM: MRI HEAD WITHOUT CONTRAST TECHNIQUE: Multiplanar, multiecho pulse sequences of the brain and surrounding structures were obtained without intravenous contrast. COMPARISON:  Head CT 09/21/2023.  MRI 01/18/2018. FINDINGS: Brain: Diffusion imaging does not show any acute or subacute infarction or other cause of restricted diffusion. No focal abnormality seen affecting the brainstem or cerebellum. Within the cerebral hemispheres, there are a few scattered foci of T2 and FLAIR signal within the white matter consistent with minimal small vessel change, minimally progressive since 2019. No cortical or large vessel territory stroke. No intra-axial mass lesion, hydrocephalus or frank subdural hematoma. There is a small region of dural thickening at the lateral right convexity with maximal thickness of 2 mm that could be the residua of a tiny subdural hematoma or could be a sessile meningioma, not present in 2019. Minimal subdural residua is favored. Vascular: Major vessels at the base of the brain show flow. Skull  and upper cervical spine: Negative Sinuses/Orbits: Clear/normal Other: None IMPRESSION: 1. No acute finding. Minimal small vessel change of the cerebral hemispheric white  matter, minimally progressive since 2019. 2. Small region of dural thickening at the lateral right convexity with maximal thickness of 2 mm that could be the residua of a tiny subdural hematoma or could be a sessile meningioma, not present in 2019. Minimal subdural residua is favored. Electronically Signed   By: Paulina Fusi M.D.   On: 03/03/2024 07:48       Assessment & Plan:  Gastroesophageal reflux disease, unspecified whether esophagitis present Assessment & Plan: Continue protonix.    Cervicalgia Assessment & Plan: Has had a problem with chronic neck issues.  Has been to PT. Neck pain appears to be better.  Follow.    Polyp of colon, unspecified part of colon, unspecified type Assessment & Plan: Colonoscopy 2019 - normal.  Non bleeding internal hemorrhoids.    Type 2 diabetes mellitus with hyperglycemia, without long-term current use of insulin (HCC) Assessment & Plan: Follow met b and A1c.  No change today.  Lab Results  Component Value Date   HGBA1C 6.5 12/18/2023      Syncope, unspecified syncope type Assessment & Plan: Seeing Dr Juliann Pares. Is s/p carotid dopplers, calcium score and echo. Holter unremarkable. Carotid dopplers - negative. Echo unremarkable. Calcium score 28. Last evaluated 02/01/24 - recommended to hold losartan and her cholesterol medication. She was anxious about stopping. Elected to continue crestor. Also, agreeable to 1/2 losartan.  Blood pressure doing well. Reviewed outside readings - blood pressure averaging - 111-130/60-70s. Has had worsening depression and anxiety. Evaluated by Dr Elna Breslow 03/03/24. Recommended to add lamictal. Continues on lexapro and mirtazapine. Continues on rexulti. Seeing neurology as well - syncope and headache. Planning for EEG - scheduled for 03/14/24. Had MRI brain 02/09/24 - No acute finding. Minimal small vessel change of the cerebral  hemispheric white matter, minimally progressive since 2019. Small region of dural thickening at the  lateral right convexity with maximal thickness of 2 mm that could be the residua of a tiny subdural hematoma or could be a sessile meningioma, not present in 2019. Minimal subdural residua is favored. Recommended f/u MRI in one year.   Severe episode of recurrent major depressive disorder, without psychotic features [facility]) Assessment & Plan: Seeing psychiatry.  Has had worsening depression and anxiety. Evaluated by Dr Elna Breslow 03/03/24. Recommended to add lamictal. Continues on lexapro and mirtazapine. Continues on rexulti. Increased stress/anxiety with increased testing. Discussed. Continue f/u with psychiatry.    Hypertension, unspecified type Assessment & Plan: Blood pressure as outlined.  Off amlodipine. On losartan 25mg  - 1/2 tablet per day. Follow pressures. Follow metabolic panel.    Hypercholesterolemia Assessment & Plan: Continues crestor. Follow lipid panel and liver function tests.       [place], MD

## 2024-03-16 ENCOUNTER — Encounter: Payer: Self-pay | Admitting: Internal Medicine

## 2024-03-16 NOTE — Assessment & Plan Note (Signed)
 Seeing psychiatry.  Has had worsening depression and anxiety. Evaluated by Dr Elna Breslow 03/03/24. Recommended to add lamictal. Continues on lexapro and mirtazapine. Continues on rexulti. Increased stress/anxiety with increased testing. Discussed. Continue f/u with psychiatry.

## 2024-03-16 NOTE — Assessment & Plan Note (Signed)
 Seeing Dr Juliann Pares. Is s/p carotid dopplers, calcium score and echo. Holter unremarkable. Carotid dopplers - negative. Echo unremarkable. Calcium score 28. Last evaluated 02/01/24 - recommended to hold losartan and her cholesterol medication. She was anxious about stopping. Elected to continue crestor. Also, agreeable to 1/2 losartan.  Blood pressure doing well. Reviewed outside readings - blood pressure averaging - 111-130/60-70s. Has had worsening depression and anxiety. Evaluated by Dr Elna Breslow 03/03/24. Recommended to add lamictal. Continues on lexapro and mirtazapine. Continues on rexulti. Seeing neurology as well - syncope and headache. Planning for EEG - scheduled for 03/14/24. Had MRI brain 02/09/24 - No acute finding. Minimal small vessel change of the cerebral  hemispheric white matter, minimally progressive since 2019. Small region of dural thickening at the lateral right convexity with maximal thickness of 2 mm that could be the residua of a tiny subdural hematoma or could be a sessile meningioma, not present in 2019. Minimal subdural residua is favored. Recommended f/u MRI in one year.

## 2024-03-16 NOTE — Assessment & Plan Note (Signed)
 Blood pressure as outlined.  Off amlodipine. On losartan 25mg  - 1/2 tablet per day. Follow pressures. Follow metabolic panel.

## 2024-03-16 NOTE — Assessment & Plan Note (Signed)
 Continue protonix

## 2024-03-16 NOTE — Assessment & Plan Note (Signed)
 Has had a problem with chronic neck issues.  Has been to PT. Neck pain appears to be better.  Follow.

## 2024-03-16 NOTE — Assessment & Plan Note (Signed)
Continues crestor.  Follow lipid panel and liver function tests.

## 2024-03-16 NOTE — Assessment & Plan Note (Signed)
Colonoscopy 2019 - normal.  Non bleeding internal hemorrhoids.  

## 2024-03-16 NOTE — Assessment & Plan Note (Signed)
 Follow met b and A1c.  No change today.  Lab Results  Component Value Date   HGBA1C 6.5 12/18/2023

## 2024-03-21 ENCOUNTER — Other Ambulatory Visit: Payer: Self-pay | Admitting: Gastroenterology

## 2024-03-21 DIAGNOSIS — K769 Liver disease, unspecified: Secondary | ICD-10-CM

## 2024-03-24 DIAGNOSIS — H2511 Age-related nuclear cataract, right eye: Secondary | ICD-10-CM | POA: Diagnosis not present

## 2024-03-24 DIAGNOSIS — H25013 Cortical age-related cataract, bilateral: Secondary | ICD-10-CM | POA: Diagnosis not present

## 2024-03-24 DIAGNOSIS — H25043 Posterior subcapsular polar age-related cataract, bilateral: Secondary | ICD-10-CM | POA: Diagnosis not present

## 2024-03-24 DIAGNOSIS — H2513 Age-related nuclear cataract, bilateral: Secondary | ICD-10-CM | POA: Diagnosis not present

## 2024-03-26 ENCOUNTER — Ambulatory Visit
Admission: RE | Admit: 2024-03-26 | Discharge: 2024-03-26 | Disposition: A | Discharge status: Home / Self Care | Source: Ambulatory Visit | Attending: Gastroenterology | Admitting: Gastroenterology

## 2024-03-26 DIAGNOSIS — K769 Liver disease, unspecified: Secondary | ICD-10-CM | POA: Diagnosis not present

## 2024-03-26 DIAGNOSIS — K7689 Other specified diseases of liver: Secondary | ICD-10-CM | POA: Diagnosis not present

## 2024-03-26 MED ORDER — GADOBUTROL 1 MMOL/ML IV SOLN
6.0000 mL | Freq: Once | INTRAVENOUS | Status: AC | PRN
  Administered 2024-03-26: 6 mL via INTRAVENOUS

## 2024-03-28 ENCOUNTER — Other Ambulatory Visit: Payer: Self-pay | Admitting: Psychiatry

## 2024-03-28 DIAGNOSIS — F331 Major depressive disorder, recurrent, moderate: Secondary | ICD-10-CM

## 2024-03-28 DIAGNOSIS — F411 Generalized anxiety disorder: Secondary | ICD-10-CM

## 2024-04-01 ENCOUNTER — Telehealth: Payer: Self-pay

## 2024-04-01 ENCOUNTER — Other Ambulatory Visit (HOSPITAL_COMMUNITY): Payer: Self-pay | Admitting: Psychiatry

## 2024-04-01 MED ORDER — LAMOTRIGINE 25 MG PO TABS: 25.0000 mg | ORAL_TABLET | Freq: Two times a day (BID) | ORAL | 2 refills | Status: DC

## 2024-04-01 NOTE — Telephone Encounter (Signed)
 Pt.notified

## 2024-04-01 NOTE — Telephone Encounter (Signed)
 pt states that the pharmacy did not get the updated dosage of the lamotrigine . it was the same one as before she is suppose to be on 50mg  now. pt was last seen on 3-24 next appt 5-6

## 2024-04-02 DIAGNOSIS — F4323 Adjustment disorder with mixed anxiety and depressed mood: Secondary | ICD-10-CM | POA: Diagnosis not present

## 2024-04-02 DIAGNOSIS — F331 Major depressive disorder, recurrent, moderate: Secondary | ICD-10-CM | POA: Diagnosis not present

## 2024-04-04 ENCOUNTER — Ambulatory Visit

## 2024-04-04 DIAGNOSIS — E538 Deficiency of other specified B group vitamins: Secondary | ICD-10-CM

## 2024-04-04 MED ORDER — CYANOCOBALAMIN 1000 MCG/ML IJ SOLN
1000.0000 ug | Freq: Once | INTRAMUSCULAR | Status: AC
  Administered 2024-04-04: 1000 ug via INTRAMUSCULAR

## 2024-04-04 NOTE — Progress Notes (Signed)
 Patient presented for B 12 injection to left deltoid, patient voiced no concerns nor showed any signs of distress during injection.

## 2024-04-10 HISTORY — PX: CATARACT EXTRACTION: SUR2

## 2024-04-15 ENCOUNTER — Telehealth (INDEPENDENT_AMBULATORY_CARE_PROVIDER_SITE_OTHER): Admitting: Psychiatry

## 2024-04-15 ENCOUNTER — Encounter: Payer: Self-pay | Admitting: Psychiatry

## 2024-04-15 DIAGNOSIS — F3342 Major depressive disorder, recurrent, in full remission: Secondary | ICD-10-CM

## 2024-04-15 DIAGNOSIS — M5412 Radiculopathy, cervical region: Secondary | ICD-10-CM | POA: Diagnosis not present

## 2024-04-15 DIAGNOSIS — F411 Generalized anxiety disorder: Secondary | ICD-10-CM | POA: Diagnosis not present

## 2024-04-15 DIAGNOSIS — M5020 Other cervical disc displacement, unspecified cervical region: Secondary | ICD-10-CM | POA: Diagnosis not present

## 2024-04-15 DIAGNOSIS — M4802 Spinal stenosis, cervical region: Secondary | ICD-10-CM | POA: Diagnosis not present

## 2024-04-15 MED ORDER — BREXPIPRAZOLE 0.25 MG PO TABS: 0.2500 mg | ORAL_TABLET | Freq: Every day | ORAL | 1 refills | Status: DC

## 2024-04-15 MED ORDER — LAMOTRIGINE 25 MG PO TABS: 25.0000 mg | ORAL_TABLET | Freq: Two times a day (BID) | ORAL | 2 refills | Status: DC

## 2024-04-15 NOTE — Progress Notes (Signed)
 Virtual Visit via Video Note  I connected with Angelica Robertson on 04/15/24 at 10:30 AM EDT by a video enabled telemedicine application and verified that I am speaking with the correct person using two identifiers.  Location Provider Location : [facility] Patient Location : Home  Participants: Patient , Provider   I discussed the limitations of evaluation and management by telemedicine and the availability of in person appointments. The patient expressed understanding and agreed to proceed.   I discussed the assessment and treatment plan with the patient. The patient was provided an opportunity to ask questions and all were answered. The patient agreed with the plan and demonstrated an understanding of the instructions.   The patient was advised to call back or seek an in-person evaluation if the symptoms worsen or if the condition fails to improve as anticipated.   BH MD OP Progress Note  04/15/2024 11:25 AM Angelica Robertson  MRN:  [id number]  Chief Complaint:  Chief Complaint  Patient presents with   Follow-up   Depression   Anxiety   Medication Refill   Discussed the use of AI scribe software for clinical note transcription with the patient, who gave verbal consent to proceed.  History of Present Illness Angelica Robertson is a 78 year old Caucasian female, married, retired, lives in [place], has a history of MDD, GAD, hyperlipidemia, arthritis was evaluated by telemedicine today.   She has experienced improvement in her mental health, engaging in church and home group meetings, which she had previously avoided due to social withdrawal. Her anxiety and social withdrawal have improved, and she is proud of her progress. No sadness, hopelessness, lack of motivation, or lack of interest in activities she enjoys, except during headaches. No feelings of worthlessness, trouble concentrating, or changes in movement or speech.  Denies suicidal thoughts. Her energy levels have improved, allowing her to engage  more in household activities such as cooking, sweeping, and doing laundry.  She continues to experience daily headaches, which she suspects may be related to a bulging disc in her neck. The headaches are described as distracting and complicate her daily activities, although they do not affect her sleep, as they subside when she lies down.  She is currently taking lamotrigine , with a recent issue regarding the prescription refill, alternating between two tablets every other day and one tablet every other day due to a shortage. She also takes Rexulti , alternating between 0.5 mg every other day and 0.25 mg on the rest of the days. She wants to reduce her medication load and is considering tapering off Rexulti . She confirms that she is still taking mirtazapine  at a dose of 22.5 mg, which she has enough supply of until June.  She uses over-the-counter eye drops for dry eyes and has been prescribed additional eye drops in preparation for cataract surgery scheduled for the 19th of this month. No memory issues or changes in her short-term memory. She successfully completes a cognitive task involving subtraction and recalls a set of words given to her earlier in the conversation.  She denies any homicidality or perceptual disturbances.  Denies any other concerns today.    Visit Diagnosis:    ICD-10-CM   1. Recurrent major depressive disorder, in full remission (HCC)  F33.42 lamoTRIgine  (LAMICTAL ) 25 MG tablet    brexpiprazole  (REXULTI ) 0.25 MG TABS tablet    2. GAD (generalized anxiety disorder)  F41.1 lamoTRIgine  (LAMICTAL ) 25 MG tablet      Past Psychiatric History: I have reviewed past  psychiatric history from progress note on 01/12/2020. Previously attempted TMS-07/17/2022-did not tolerate it. Past trials of medications like duloxetine , Zoloft , Wellbutrin , Klonopin , Seroquel, trazodone , zolpidem . Inpatient behavioral health admissions multiple in the past-most recent-old [facility]-09/22/2023  - 09/30/2023.    Past Medical History:  Past Medical History:  Diagnosis Date   Depression 2000   Heart murmur    Hypercholesterolemia Osteoporosis   Hyperlipemia    Hypertension     Past Surgical History:  Procedure Laterality Date   ABDOMINAL HYSTERECTOMY  1980   COLONOSCOPY WITH PROPOFOL  N/A 03/05/2018   Procedure: COLONOSCOPY WITH PROPOFOL ;  Surgeon: Toledo, Alphonsus Jeans, MD;  Location: [facility] ENDOSCOPY;  Service: Endoscopy;  Laterality: N/A;   COLONOSCOPY WITH PROPOFOL  N/A 04/23/2023   Procedure: COLONOSCOPY WITH PROPOFOL ;  Surgeon: Shane Darling, MD;  Location: [facility] ENDOSCOPY;  Service: Endoscopy;  Laterality: N/A;    Family Psychiatric History: I have reviewed family psychiatric history from progress note on 01/12/2020.  Family History:  Family History  Problem Relation Age of Onset   Heart disease Mother    Diabetes Mother    Heart disease Father    Diabetes Father    Colon cancer Other    Congestive Heart Failure Brother    Colon cancer Brother    Mental illness Paternal Aunt    BRCA 1/2 Neg Hx    Breast cancer Neg Hx    Cowden syndrome Neg Hx    DES usage Neg Hx    Endometrial cancer Neg Hx    Li-Fraumeni syndrome Neg Hx    Ovarian cancer Neg Hx     Social History: I have reviewed social history from progress note on 01/12/2020. Social History   Socioeconomic History   Marital status: Married    Spouse name: robert   Number of children: 2   Years of education: Not on file   Highest education level: 12th grade  Occupational History   Not on file  Tobacco Use   Smoking status: Never   Smokeless tobacco: Never  Vaping Use   Vaping status: Never Used  Substance and Sexual Activity   Alcohol use: Yes    Alcohol/week: 0.0 standard drinks of alcohol    Comment: occasionally - socially during the summer a glass of wine or beer   Drug use: No   Sexual activity: Yes  Other Topics Concern   Not on file  Social History Narrative   Not on file   Social  Drivers of Health   Financial Resource Strain: Low Risk  (12/17/2023)   Overall Financial Resource Strain (CARDIA)    Difficulty of Paying Living Expenses: Not hard at all  Food Insecurity: No Food Insecurity (12/17/2023)   Hunger Vital Sign    Worried About Running Out of Food in the Last Year: Never true    Ran Out of Food in the Last Year: Never true  Transportation Needs: No Transportation Needs (12/17/2023)   PRAPARE - Administrator, Civil Service (Medical): No    Lack of Transportation (Non-Medical): No  Physical Activity: Inactive (12/17/2023)   Exercise Vital Sign    Days of Exercise per Week: 0 days    Minutes of Exercise per Session: 30 min  Stress: Stress Concern Present (12/17/2023)   [employer] of Occupational Health - Occupational Stress Questionnaire    Feeling of Stress : Rather much  Social Connections: Socially Integrated (12/17/2023)   Social Connection and Isolation Panel [NHANES]    Frequency of Communication with Friends  and Family: Twice a week    Frequency of Social Gatherings with Friends and Family: More than three times a week    Attends Religious Services: More than 4 times per year    Active Member of Clubs or Organizations: Yes    Attends Engineer, structural: More than 4 times per year    Marital Status: Married    Allergies: No Known Allergies  Metabolic Disorder Labs: Lab Results  Component Value Date   HGBA1C 6.5 12/18/2023   No results found for: "PROLACTIN" Lab Results  Component Value Date   CHOL 144 12/18/2023   TRIG 84.0 12/18/2023   HDL 64.60 12/18/2023   CHOLHDL 2 12/18/2023   VLDL 16.8 12/18/2023   LDLCALC 63 12/18/2023   LDLCALC 38 08/10/2023   Lab Results  Component Value Date   TSH 1.74 12/20/2023   TSH 1.860 08/10/2023    Therapeutic Level Labs: No results found for: "LITHIUM" No results found for: "VALPROATE" No results found for: "CBMZ"  Current Medications: Current Outpatient Medications   Medication Sig Dispense Refill   brexpiprazole  (REXULTI ) 0.25 MG TABS tablet Take 1 tablet (0.25 mg total) by mouth daily. 30 tablet 1   gatifloxacin (ZYMAXID) 0.5 % SOLN SMARTSIG:In Eye(s)     ketorolac (ACULAR) 0.5 % ophthalmic solution SMARTSIG:In Eye(s)     prednisoLONE acetate (PRED FORTE) 1 % ophthalmic suspension SMARTSIG:In Eye(s)     calcium  carbonate (OS-CAL) 600 MG TABS tablet Take 600 mg by mouth 2 (two) times daily with a meal.     escitalopram  (LEXAPRO ) 20 MG tablet Take 1 tablet (20 mg total) by mouth daily. 90 tablet 3   lamoTRIgine  (LAMICTAL ) 25 MG tablet Take 1 tablet (25 mg total) by mouth 2 (two) times daily. 60 tablet 2   losartan  (COZAAR ) 25 MG tablet TAKE 1 TABLET BY MOUTH ONCE DAILY 90 tablet 1   mirtazapine  (REMERON ) 15 MG tablet TAKE 1 and 1/2 TABLETS BY MOUTH AT BEDTIME . AS DIRECTED DOSE CHANGE ** STOP 30 MG 45 tablet 1   pantoprazole  (PROTONIX ) 20 MG tablet Take 1 tablet (20 mg total) by mouth daily. 90 tablet 1   rosuvastatin  (CRESTOR ) 20 MG tablet Take 1 tablet (20 mg total) by mouth at bedtime. 90 tablet 3   VITAMIN D  PO Take by mouth daily.     No current facility-administered medications for this visit.     Musculoskeletal: Strength & Muscle Tone:  UTA Gait & Station:  seated Patient leans: N/A  Psychiatric Specialty Exam: Review of Systems  Neurological:  Positive for headaches (chronic).  Psychiatric/Behavioral:  The patient is nervous/anxious.     Last menstrual period 11/27/1979.There is no height or weight on file to calculate BMI.  General Appearance: Casual  Eye Contact:  Fair  Speech:  Clear and Coherent  Volume:  Normal  Mood:  Anxious improving  Affect:  Congruent  Thought Process:  Goal Directed and Descriptions of Associations: Intact  Orientation:  Full (Time, Place, and Person)  Thought Content: Logical   Suicidal Thoughts:  No  Homicidal Thoughts:  No  Memory:  Immediate;   Fair Recent;   Fair Remote;   Fair  Judgement:   Fair  Insight:  Fair  Psychomotor Activity:  Normal  Concentration:  Concentration: Fair and Attention Span: Fair  Recall:  [employer] of Knowledge: Fair  Language: Fair  Akathisia:  No  Handed:  Right  AIMS (if indicated): not done  Assets:  Desire for Improvement  Housing Social Support  ADL's:  Intact  Cognition: WNL  Sleep:  Fair   Screenings: Midwife Visit from 10/02/2023 in [facility] Office Visit from 08/28/2023 in [facility] Office Visit from 06/20/2022 in [facility] Office Visit from 04/24/2022 in [facility]  AIMS Total Score 0 0 0 0      AUDIT    Flowsheet Row Admission (Discharged) from 05/12/2022 in [facility] Admission (Discharged) from 12/17/2019 in [facility] Admission (Discharged) [facility]  Alcohol Use Disorder Identification Test Final Score (AUDIT) 0 0 0      GAD-7    Flowsheet Row Office Visit from 03/14/2024 in [facility] [employer] Visit from 01/03/2024 in [facility] [employer] [facility] Video Visit from 11/26/2023 in [facility] Video Visit from 10/19/2023 in [facility] Office Visit from 10/02/2023 in [facility]  Total GAD-7 Score 0 3 3 9 9       Mini-Mental    Flowsheet Row Clinical Support from 02/27/2018 in [facility] Clinical Support from 02/02/2017 in [facility] [place] [facility] [employer] Clinical Support from 02/03/2016 in [facility] [employer]  Total Score (max 30 points ) 30 30 30        PHQ2-9    Flowsheet Row Video Visit from 04/15/2024 in [facility] Office Visit from 03/14/2024 in [facility] Visit from 02/14/2024 in [facility] [employer] Video Visit from 01/14/2024 in [facility] Office Visit from 01/03/2024 in [facility] [place] [facility] Total Score 2 0 4 2 2   PHQ-9 Total Score 3 0 11 7 8       Flowsheet Row Video Visit from 04/15/2024 in [facility] Video Visit from 03/03/2024 in [facility] Video Visit from 01/14/2024 in [facility]  C-SSRS RISK CATEGORY No Risk No Risk No Risk        Assessment and Plan: Angelica Robertson is a 78 year old Caucasian female, married, retired, lives in [place], has a history of MDD, anxiety, hyperlipidemia was evaluated by telemedicine today.  Discussed assessment and plan as noted below.  MDD in remission Currently reports depression symptoms are stable except on days that she struggles with headaches which may have an impact on her mood.  She is planning to follow up with a specialist soon for that.  She has been taking Lamictal  lower dosage due to refill problem that she encountered recently.  Currently compliant on her medications otherwise as prescribed.  Interested in coming off of the Rexulti  since she wants to reduce her medication load at this time. - Change Lamictal  to 25 mg twice daily.  I have sent a new prescription to pharmacy. - Continue Lexapro  20 mg daily - Continue Mirtazapine  22.5 mg at bedtime ( Will let this provider know when she needs refills) - Reduce Rexulti  to  0.25 mg daily for the next 4 weeks.  Plan to discontinue this medication if she is interested. - Patient to sent this provider a my chart  message regarding her  status at the end of 4 weeks.  Generalized anxiety disorder-improved She is currently coping with her anxiety better than before although she continues to be anxious about her comorbid headaches at this time. - Continue Xanax  0.25 mg as needed for acute anxiety.  Patient to limit use. - Continue Lexapro  and Mirtazapine  as prescribed. - Continue psychotherapy sessions with Ms. Bridgette Campus at [facility].   Follow-up Follow-up in clinic in 6 to 8 weeks or sooner if needed.  Collaboration of Care: Collaboration of Care: Other patient is to follow-up with headache specialist as well as to continue follow-up with psychotherapist with [facility].  Patient/Guardian was advised Release of Information must be obtained prior to any record release in order to collaborate their care with an outside provider. Patient/Guardian was advised if they have not already done so to contact the registration department to sign all necessary forms in order for us  to release information regarding their care.   Consent: Patient/Guardian gives verbal consent for treatment and assignment of benefits for services provided during this visit. Patient/Guardian expressed understanding and agreed to proceed.   This note was generated in part or whole with voice recognition software. Voice recognition is usually quite accurate but there are transcription errors that can and very often do occur. I apologize for any typographical errors that were not detected and corrected.    Paris Chiriboga, MD 04/15/2024, 11:25 AM

## 2024-04-28 DIAGNOSIS — H2512 Age-related nuclear cataract, left eye: Secondary | ICD-10-CM | POA: Diagnosis not present

## 2024-04-28 DIAGNOSIS — H25012 Cortical age-related cataract, left eye: Secondary | ICD-10-CM | POA: Diagnosis not present

## 2024-04-28 DIAGNOSIS — Z961 Presence of intraocular lens: Secondary | ICD-10-CM | POA: Diagnosis not present

## 2024-04-28 DIAGNOSIS — H25042 Posterior subcapsular polar age-related cataract, left eye: Secondary | ICD-10-CM | POA: Diagnosis not present

## 2024-04-28 DIAGNOSIS — H2511 Age-related nuclear cataract, right eye: Secondary | ICD-10-CM | POA: Diagnosis not present

## 2024-05-01 DIAGNOSIS — F331 Major depressive disorder, recurrent, moderate: Secondary | ICD-10-CM | POA: Diagnosis not present

## 2024-05-01 DIAGNOSIS — F4323 Adjustment disorder with mixed anxiety and depressed mood: Secondary | ICD-10-CM | POA: Diagnosis not present

## 2024-05-06 ENCOUNTER — Ambulatory Visit (INDEPENDENT_AMBULATORY_CARE_PROVIDER_SITE_OTHER)

## 2024-05-06 ENCOUNTER — Encounter: Payer: Self-pay | Admitting: Pharmacist

## 2024-05-06 DIAGNOSIS — E538 Deficiency of other specified B group vitamins: Secondary | ICD-10-CM

## 2024-05-06 DIAGNOSIS — H2511 Age-related nuclear cataract, right eye: Secondary | ICD-10-CM | POA: Diagnosis not present

## 2024-05-06 MED ORDER — CYANOCOBALAMIN 1000 MCG/ML IJ SOLN
1000.0000 ug | Freq: Once | INTRAMUSCULAR | Status: AC
  Administered 2024-05-06: 1000 ug via INTRAMUSCULAR

## 2024-05-06 NOTE — Progress Notes (Signed)
 Pharmacy Quality Measure Review  This patient is appearing on a report for being at risk of failing the adherence measure for cholesterol (statin) medications this calendar year.   Medication: ROSUVASTATIN  20MG  Last fill date: 01/08/24 for 90 day supply. 2 additional refills remaining  02/01/24 was instructed to hold rosuvastatin  x3 months. Has since resumed, though gap in fill history will remain. No further action at this time. Two additional 90-day refills remaining on current prescription.

## 2024-05-06 NOTE — Progress Notes (Signed)
 Pt presented for their vitamin B12 injection. Pt was identified through two identifiers. Pt tolerated shot well in their left deltoid.

## 2024-05-07 ENCOUNTER — Other Ambulatory Visit: Payer: Self-pay | Admitting: Psychiatry

## 2024-05-07 DIAGNOSIS — F411 Generalized anxiety disorder: Secondary | ICD-10-CM

## 2024-05-07 DIAGNOSIS — F3341 Major depressive disorder, recurrent, in partial remission: Secondary | ICD-10-CM

## 2024-05-09 ENCOUNTER — Other Ambulatory Visit

## 2024-05-09 ENCOUNTER — Ambulatory Visit: Payer: Self-pay | Admitting: Internal Medicine

## 2024-05-09 DIAGNOSIS — E1165 Type 2 diabetes mellitus with hyperglycemia: Secondary | ICD-10-CM

## 2024-05-09 DIAGNOSIS — E7800 Pure hypercholesterolemia, unspecified: Secondary | ICD-10-CM

## 2024-05-09 LAB — LIPID PANEL
Cholesterol: 233 mg/dL — ABNORMAL HIGH (ref 0–200)
HDL: 68.5 mg/dL (ref >39.00)
LDL Cholesterol: 147 mg/dL — ABNORMAL HIGH (ref 0–99)
NonHDL: 164.76
Total CHOL/HDL Ratio: 3
Triglycerides: 90 mg/dL (ref 0.0–149.0)
VLDL: 18 mg/dL (ref 0.0–40.0)

## 2024-05-09 LAB — BASIC METABOLIC PANEL WITH GFR
BUN: 17 mg/dL (ref 6–23)
CO2: 32 meq/L (ref 19–32)
Calcium: 9.7 mg/dL (ref 8.4–10.5)
Chloride: 101 meq/L (ref 96–112)
Creatinine, Ser: 0.82 mg/dL (ref 0.40–1.20)
GFR: 69.81 mL/min (ref >60.00)
Glucose, Bld: 105 mg/dL — ABNORMAL HIGH (ref 70–99)
Potassium: 4.4 meq/L (ref 3.5–5.1)
Sodium: 139 meq/L (ref 135–145)

## 2024-05-09 LAB — HEMOGLOBIN A1C: Hgb A1c MFr Bld: 6.2 % (ref 4.6–6.5)

## 2024-05-09 LAB — HEPATIC FUNCTION PANEL
ALT: 16 U/L (ref 0–35)
AST: 18 U/L (ref 0–37)
Albumin: 4.3 g/dL (ref 3.5–5.2)
Alkaline Phosphatase: 46 U/L (ref 39–117)
Bilirubin, Direct: 0.1 mg/dL (ref 0.0–0.3)
Total Bilirubin: 0.6 mg/dL (ref 0.2–1.2)
Total Protein: 6.6 g/dL (ref 6.0–8.3)

## 2024-05-14 DIAGNOSIS — M542 Cervicalgia: Secondary | ICD-10-CM | POA: Diagnosis not present

## 2024-05-16 ENCOUNTER — Ambulatory Visit: Admitting: Internal Medicine

## 2024-05-16 VITALS — BP 120/70 | HR 80 | Temp 97.9°F | Resp 16 | Ht 63.0 in | Wt 130.0 lb

## 2024-05-16 DIAGNOSIS — R519 Headache, unspecified: Secondary | ICD-10-CM

## 2024-05-16 DIAGNOSIS — K635 Polyp of colon: Secondary | ICD-10-CM | POA: Diagnosis not present

## 2024-05-16 DIAGNOSIS — E1165 Type 2 diabetes mellitus with hyperglycemia: Secondary | ICD-10-CM | POA: Diagnosis not present

## 2024-05-16 DIAGNOSIS — K219 Gastro-esophageal reflux disease without esophagitis: Secondary | ICD-10-CM

## 2024-05-16 DIAGNOSIS — I10 Essential (primary) hypertension: Secondary | ICD-10-CM | POA: Diagnosis not present

## 2024-05-16 DIAGNOSIS — F332 Major depressive disorder, recurrent severe without psychotic features: Secondary | ICD-10-CM

## 2024-05-16 DIAGNOSIS — M542 Cervicalgia: Secondary | ICD-10-CM | POA: Diagnosis not present

## 2024-05-16 DIAGNOSIS — E7800 Pure hypercholesterolemia, unspecified: Secondary | ICD-10-CM

## 2024-05-16 LAB — HM DIABETES FOOT EXAM

## 2024-05-16 NOTE — Progress Notes (Signed)
 Subjective:    Patient ID: Angelica Robertson, female    DOB: Nov 17, 1946, 78 y.o.   MRN: [id number]  Patient here for  Chief Complaint  Patient presents with   Medical Management of Chronic Issues    HPI Here for a scheduled follow up - follow up regarding hypercholesterolemia, hypertension and recent syncopal episode.  Recently evaluated for syncopal episode. Seeing Dr Beau Bound. Is s/p carotid dopplers, calcium  score and echo. Holter unremarkable. Carotid dopplers - negative. Echo unremarkable. Calcium  score 28. Last evaluated 02/01/24 - recommended to hold losartan  and her cholesterol medication. She was anxious about stopping. Agreeable to 1/2 losartan . Has been off crestor .  Evaluated by Dr Tere Felts 03/03/24. Recommended to add lamictal . Continues on lexapro  and mirtazapine . Continues on rexulti . Had f/u 04/15/24 - doing better. Getting out more. Lamictal  dose changed to 25mg  bid. Resulti dose reduced. Seeing neurology as well - syncope and headache. Had MRI brain 02/09/24 - No acute finding. Minimal small vessel change of the cerebral  hemispheric white matter, minimally progressive since 2019. Small region of dural thickening at the lateral right convexity with maximal thickness of 2 mm that could be the residua of a tiny subdural hematoma or could be a sessile meningioma, not present in 2019. Minimal subdural residua is favored. Recommended f/u MRI in one year. Had f/u EEG 03/14/24 - normal. Saw [facility] 04/15/24 - neck pain and headaches. Recommended to continue tylenol , PT and prednisone taper. Started PT. Has f/u with neurology next week. She does report feeling better. Going to church. Has not been back to the gym yet, but is doing better and getting out more. Breathing stable.    Past Medical History:  Diagnosis Date   Depression 2000   Heart murmur    Hypercholesterolemia Osteoporosis   Hyperlipemia    Hypertension    Past Surgical History:  Procedure Laterality Date   ABDOMINAL HYSTERECTOMY   1980   COLONOSCOPY WITH PROPOFOL  N/A 03/05/2018   Procedure: COLONOSCOPY WITH PROPOFOL ;  Surgeon: Toledo, Alphonsus Jeans, MD;  Location: [facility] ENDOSCOPY;  Service: Endoscopy;  Laterality: N/A;   COLONOSCOPY WITH PROPOFOL  N/A 04/23/2023   Procedure: COLONOSCOPY WITH PROPOFOL ;  Surgeon: Shane Darling, MD;  Location: [facility] ENDOSCOPY;  Service: Endoscopy;  Laterality: N/A;   Family History  Problem Relation Age of Onset   Heart disease Mother    Diabetes Mother    Heart disease Father    Diabetes Father    Colon cancer Other    Congestive Heart Failure Brother    Colon cancer Brother    Mental illness Paternal Aunt    BRCA 1/2 Neg Hx    Breast cancer Neg Hx    Cowden syndrome Neg Hx    DES usage Neg Hx    Endometrial cancer Neg Hx    Li-Fraumeni syndrome Neg Hx    Ovarian cancer Neg Hx    Social History   Socioeconomic History   Marital status: Married    Spouse name: robert   Number of children: 2   Years of education: Not on file   Highest education level: 12th grade  Occupational History   Not on file  Tobacco Use   Smoking status: Never   Smokeless tobacco: Never  Vaping Use   Vaping status: Never Used  Substance and Sexual Activity   Alcohol use: Yes    Alcohol/week: 0.0 standard drinks of alcohol    Comment: occasionally - socially during the summer a glass of wine or beer  Drug use: No   Sexual activity: Yes  Other Topics Concern   Not on file  Social History Narrative   Not on file   Social Drivers of Health   Financial Resource Strain: Low Risk  (12/17/2023)   Overall Financial Resource Strain (CARDIA)    Difficulty of Paying Living Expenses: Not hard at all  Food Insecurity: No Food Insecurity (12/17/2023)   Hunger Vital Sign    Worried About Running Out of Food in the Last Year: Never true    Ran Out of Food in the Last Year: Never true  Transportation Needs: No Transportation Needs (12/17/2023)   PRAPARE - Administrator, Civil Service  (Medical): No    Lack of Transportation (Non-Medical): No  Physical Activity: Unknown (12/17/2023)   Exercise Vital Sign    Days of Exercise per Week: 0 days    Minutes of Exercise per Session: Not on file  Recent Concern: Physical Activity - Inactive (12/17/2023)   Exercise Vital Sign    Days of Exercise per Week: 0 days    Minutes of Exercise per Session: 30 min  Stress: Stress Concern Present (12/17/2023)   [employer] of Occupational Health - Occupational Stress Questionnaire    Feeling of Stress : Rather much  Social Connections: Socially Integrated (12/17/2023)   Social Connection and Isolation Panel [NHANES]    Frequency of Communication with Friends and Family: Twice a week    Frequency of Social Gatherings with Friends and Family: More than three times a week    Attends Religious Services: More than 4 times per year    Active Member of [employer] or Organizations: Yes    Attends Engineer, structural: More than 4 times per year    Marital Status: Married     Review of Systems  Constitutional:  Negative for appetite change and unexpected weight change.  HENT:  Negative for congestion and sinus pressure.   Respiratory:  Negative for cough, chest tightness and shortness of breath.   Cardiovascular:  Negative for chest pain, palpitations and leg swelling.  Gastrointestinal:  Negative for abdominal pain, diarrhea, nausea and vomiting.  Genitourinary:  Negative for difficulty urinating and dysuria.  Musculoskeletal:  Negative for joint swelling and myalgias.  Skin:  Negative for color change and rash.  Neurological:  Negative for dizziness.       Persistent intermittent headaches.   Psychiatric/Behavioral:         Feeling better. Getting out more. Depression is better.        Objective:     BP 120/70   Pulse 80   Temp 97.9 F (36.6 C)   Resp 16   Ht 5\' 3"  (1.6 m)   Wt 130 lb (59 kg)   LMP 11/27/1979   SpO2 98%   BMI 23.03 kg/m  Wt Readings from Last 3  Encounters:  05/16/24 130 lb (59 kg)  03/14/24 125 lb (56.7 kg)  02/14/24 125 lb 12.8 oz (57.1 kg)    Physical Exam      Outpatient Encounter Medications as of 05/16/2024  Medication Sig   brexpiprazole  (REXULTI ) 0.25 MG TABS tablet Take 1 tablet (0.25 mg total) by mouth daily.   calcium  carbonate (OS-CAL) 600 MG TABS tablet Take 600 mg by mouth 2 (two) times daily with a meal.   escitalopram  (LEXAPRO ) 20 MG tablet Take 1 tablet (20 mg total) by mouth daily.   gatifloxacin (ZYMAXID) 0.5 % SOLN SMARTSIG:In Eye(s)   ketorolac (ACULAR) 0.5 %  ophthalmic solution SMARTSIG:In Eye(s)   lamoTRIgine  (LAMICTAL ) 25 MG tablet Take 1 tablet (25 mg total) by mouth 2 (two) times daily.   losartan  (COZAAR ) 25 MG tablet TAKE 1 TABLET BY MOUTH ONCE DAILY   mirtazapine  (REMERON ) 15 MG tablet TAKE 1 and 1/2 TABLETS BY MOUTH AT BEDTIME . AS DIRECTED DOSE CHANGE ** STOP 30 MG   pantoprazole  (PROTONIX ) 20 MG tablet Take 1 tablet (20 mg total) by mouth daily.   prednisoLONE acetate (PRED FORTE) 1 % ophthalmic suspension SMARTSIG:In Eye(s)   rosuvastatin  (CRESTOR ) 20 MG tablet Take 1 tablet (20 mg total) by mouth at bedtime.   VITAMIN D  PO Take by mouth daily.   No facility-administered encounter medications on file as of 05/16/2024.     Lab Results  Component Value Date   WBC 10.5 12/18/2023   HGB 13.8 12/18/2023   HCT 41.2 12/18/2023   PLT 365.0 12/18/2023   GLUCOSE 105 (H) 05/09/2024   CHOL 233 (H) 05/09/2024   TRIG 90.0 05/09/2024   HDL 68.50 05/09/2024   LDLCALC 147 (H) 05/09/2024   ALT 16 05/09/2024   AST 18 05/09/2024   NA 139 05/09/2024   K 4.4 05/09/2024   CL 101 05/09/2024   CREATININE 0.82 05/09/2024   BUN 17 05/09/2024   CO2 32 05/09/2024   TSH 1.74 12/20/2023   HGBA1C 6.2 05/09/2024   MICROALBUR <0.7 12/20/2023    MR ABDOMEN WWO CONTRAST Result Date: 04/02/2024 CLINICAL DATA:  Liver lesion. EXAM: MRI ABDOMEN WITHOUT AND WITH CONTRAST TECHNIQUE: Multiplanar multisequence MR  imaging of the abdomen was performed both before and after the administration of intravenous contrast. CONTRAST:  6mL GADAVIST  GADOBUTROL  1 MMOL/ML IV SOLN COMPARISON:  Coronary CT 01/02/2024. Ultrasound 09/27/2022 and older. FINDINGS: Lower chest: No pleural effusion seen along the lung bases. Hepatobiliary: The liver has a minimally septated bright T2 low T1 smoothly marginated focus in segment 2 of the liver measuring 5.7 by 6.4 by 5.2 cm. No aggressive features. No enhancing nodules. No restricted diffusion. Additional smaller cystic focus identified in segment 3 on series 4, image 21 measuring 16 x 9 mm. This has a thin septation. Again no aggressive features. Third lesion identified in the right hepatic lobe segment 6 which also has some thin marginal septa. This lesion the slightly lobular but otherwise is bright on T2, low on T1 and does not show any abnormal enhancement or restricted diffusion. This lesion measures 2.8 x 3.0 by 4.3 cm. Again a benign cystic lesion. Lastly there are some punctate scattered cystic foci throughout the liver measuring less than 3 mm best seen on coronal T2 datasets. Again likely benign cystic foci based on overall appearance. Gallbladder is nondilated. No biliary ductal dilatation. Patent portal vein. Pancreas: No mass, inflammatory changes, or other parenchymal abnormality identified. No pancreatic ductal dilatation. Spleen: Within normal limits in size and appearance. Small splenule anteriorly. Adrenals/Urinary Tract: Adrenal glands are preserved. No enhancing renal mass or collecting system dilatation. Punctate renal cystic foci identified, too small to completely characterize but likely simple, Bosniak 2. No imaging follow-up. Slight nonspecific perinephric stranding. Stomach/Bowel: Visualized portions within the abdomen are unremarkable. Vascular/Lymphatic: Normal caliber aorta and IVC. Retroaortic left renal vein. No specific abnormal lymph node enlargement identified in  the visualized portions of the abdomen. Other:  No ascites identified. Musculoskeletal: Mild degenerative changes. IMPRESSION: Multiple hepatic cysts are identified. These are minimally septated but have benign features. Electronically Signed   By: Adrianna Horde M.D.   On: 04/02/2024  11:00       Assessment & Plan:  Gastroesophageal reflux disease, unspecified whether esophagitis present Assessment & Plan: No acid reflux reported. Continues protonix .    Cervicalgia Assessment & Plan: Has had problems with chronic neck issues/pain. Saw [facility] - Physiatry. Recommended PT. Started PT. Follow.    Polyp of colon, unspecified part of colon, unspecified type Assessment & Plan: Colonoscopy 2019 - normal.  Non bleeding internal hemorrhoids.    Acute nonintractable headache, unspecified headache type Assessment & Plan:  Seeing neurology as well - syncope and headache. Had MRI brain 02/09/24 - No acute finding. Minimal small vessel change of the cerebral  hemispheric white matter, minimally progressive since 2019. Small region of dural thickening at the lateral right convexity with maximal thickness of 2 mm that could be the residua of a tiny subdural hematoma or could be a sessile meningioma, not present in 2019. Minimal subdural residua is favored. Recommended f/u MRI in one year. Had f/u EEG 03/14/24 - normal. Saw [facility] 04/15/24 - neck pain and headaches. Recommended to continue tylenol , PT and prednisone taper. Started PT. Has f/u with neurology next week.    Hypercholesterolemia Assessment & Plan: Continue crestor . Follow lipid panel and liver function.    Hypertension, unspecified type Assessment & Plan: On losartan  1/2 tablet. Blood pressure doing well. Reviewed outside readings - 110-120s/60s (averaging). No changes in medication. Follow pressures.  Follow metabolic panel.    Major depressive disorder, recurrent, severe without psychotic features [facility]) Assessment & Plan: Evaluated by Dr  Tere Felts 03/03/24. Recommended to add lamictal . Continues on lexapro  and mirtazapine . Continues on rexulti . Had f/u 04/15/24 - doing better. Getting out more. Lamictal  dose changed to 25mg  bid. Resulti dose reduced.    Type 2 diabetes mellitus with hyperglycemia, without long-term current use of insulin (HCC) Assessment & Plan: Follow met b and A1c.  No change today.  Lab Results  Component Value Date   HGBA1C 6.2 05/09/2024         Dellar Fenton, MD

## 2024-05-17 ENCOUNTER — Encounter: Payer: Self-pay | Admitting: Internal Medicine

## 2024-05-17 NOTE — Assessment & Plan Note (Signed)
 Follow met b and A1c.  No change today.  Lab Results  Component Value Date   HGBA1C 6.2 05/09/2024

## 2024-05-17 NOTE — Assessment & Plan Note (Signed)
 Seeing neurology as well - syncope and headache. Had MRI brain 02/09/24 - No acute finding. Minimal small vessel change of the cerebral  hemispheric white matter, minimally progressive since 2019. Small region of dural thickening at the lateral right convexity with maximal thickness of 2 mm that could be the residua of a tiny subdural hematoma or could be a sessile meningioma, not present in 2019. Minimal subdural residua is favored. Recommended f/u MRI in one year. Had f/u EEG 03/14/24 - normal. Saw [facility] 04/15/24 - neck pain and headaches. Recommended to continue tylenol , PT and prednisone taper. Started PT. Has f/u with neurology next week.

## 2024-05-17 NOTE — Assessment & Plan Note (Signed)
 No acid reflux reported. Continues protonix .

## 2024-05-17 NOTE — Assessment & Plan Note (Signed)
 On losartan  1/2 tablet. Blood pressure doing well. Reviewed outside readings - 110-120s/60s (averaging). No changes in medication. Follow pressures.  Follow metabolic panel.

## 2024-05-17 NOTE — Assessment & Plan Note (Signed)
 Continue crestor . Follow lipid panel and liver function.

## 2024-05-17 NOTE — Assessment & Plan Note (Signed)
 Has had problems with chronic neck issues/pain. Saw [facility] - Physiatry. Recommended PT. Started PT. Follow.

## 2024-05-17 NOTE — Assessment & Plan Note (Signed)
 Evaluated by Dr Tere Felts 03/03/24. Recommended to add lamictal . Continues on lexapro  and mirtazapine . Continues on rexulti . Had f/u 04/15/24 - doing better. Getting out more. Lamictal  dose changed to 25mg  bid. Resulti dose reduced.

## 2024-05-17 NOTE — Assessment & Plan Note (Signed)
Colonoscopy 2019 - normal.  Non bleeding internal hemorrhoids.  

## 2024-05-19 DIAGNOSIS — M542 Cervicalgia: Secondary | ICD-10-CM | POA: Diagnosis not present

## 2024-05-20 ENCOUNTER — Other Ambulatory Visit: Payer: Self-pay | Admitting: Internal Medicine

## 2024-05-20 DIAGNOSIS — Z1231 Encounter for screening mammogram for malignant neoplasm of breast: Secondary | ICD-10-CM

## 2024-05-21 DIAGNOSIS — M542 Cervicalgia: Secondary | ICD-10-CM | POA: Diagnosis not present

## 2024-05-22 DIAGNOSIS — R55 Syncope and collapse: Secondary | ICD-10-CM | POA: Diagnosis not present

## 2024-05-22 DIAGNOSIS — Z8659 Personal history of other mental and behavioral disorders: Secondary | ICD-10-CM | POA: Diagnosis not present

## 2024-05-22 DIAGNOSIS — Z1331 Encounter for screening for depression: Secondary | ICD-10-CM | POA: Diagnosis not present

## 2024-05-22 DIAGNOSIS — R519 Headache, unspecified: Secondary | ICD-10-CM | POA: Diagnosis not present

## 2024-05-26 DIAGNOSIS — M542 Cervicalgia: Secondary | ICD-10-CM | POA: Diagnosis not present

## 2024-05-28 DIAGNOSIS — M542 Cervicalgia: Secondary | ICD-10-CM | POA: Diagnosis not present

## 2024-06-02 DIAGNOSIS — M542 Cervicalgia: Secondary | ICD-10-CM | POA: Diagnosis not present

## 2024-06-04 DIAGNOSIS — F4323 Adjustment disorder with mixed anxiety and depressed mood: Secondary | ICD-10-CM | POA: Diagnosis not present

## 2024-06-04 DIAGNOSIS — F331 Major depressive disorder, recurrent, moderate: Secondary | ICD-10-CM | POA: Diagnosis not present

## 2024-06-04 DIAGNOSIS — M542 Cervicalgia: Secondary | ICD-10-CM | POA: Diagnosis not present

## 2024-06-06 ENCOUNTER — Ambulatory Visit (INDEPENDENT_AMBULATORY_CARE_PROVIDER_SITE_OTHER)

## 2024-06-06 DIAGNOSIS — E538 Deficiency of other specified B group vitamins: Secondary | ICD-10-CM

## 2024-06-06 MED ORDER — CYANOCOBALAMIN 1000 MCG/ML IJ SOLN
1000.0000 ug | Freq: Once | INTRAMUSCULAR | Status: AC
  Administered 2024-06-06: 1000 ug via INTRAMUSCULAR

## 2024-06-06 NOTE — Progress Notes (Signed)
 Patient presented for B 12 injection to right deltoid, patient voiced no concerns nor showed any signs of distress during injection.

## 2024-06-09 ENCOUNTER — Encounter: Payer: Self-pay | Admitting: Psychiatry

## 2024-06-09 ENCOUNTER — Telehealth (INDEPENDENT_AMBULATORY_CARE_PROVIDER_SITE_OTHER): Admitting: Psychiatry

## 2024-06-09 DIAGNOSIS — M542 Cervicalgia: Secondary | ICD-10-CM | POA: Diagnosis not present

## 2024-06-09 DIAGNOSIS — F3342 Major depressive disorder, recurrent, in full remission: Secondary | ICD-10-CM | POA: Diagnosis not present

## 2024-06-09 DIAGNOSIS — F411 Generalized anxiety disorder: Secondary | ICD-10-CM | POA: Diagnosis not present

## 2024-06-09 NOTE — Progress Notes (Unsigned)
 Virtual Visit via Video Note  I connected with Angelica Robertson on 06/09/24 at  1:40 PM EDT by a video enabled telemedicine application and verified that I am speaking with the correct person using two identifiers.  Location Provider Location : [facility] Patient Location : Home  Participants: Patient , Provider    I discussed the limitations of evaluation and management by telemedicine and the availability of in person appointments. The patient expressed understanding and agreed to proceed.   I discussed the assessment and treatment plan with the patient. The patient was provided an opportunity to ask questions and all were answered. The patient agreed with the plan and demonstrated an understanding of the instructions.   The patient was advised to call back or seek an in-person evaluation if the symptoms worsen or if the condition fails to improve as anticipated.   BH MD OP Progress Note  06/09/2024 1:56 PM Angelica Robertson  MRN:  [id number]  Chief Complaint:  Chief Complaint  Patient presents with   Follow-up   Anxiety   Depression   Medication Refill   Discussed the use of AI scribe software for clinical note transcription with the patient, who gave verbal consent to proceed.  History of Present Illness Angelica Robertson is a 78 year old Caucasian female, married, retired, lives in [place], has a history of MDD, GAD, hyperlipidemia, arthritis was evaluated by telemedicine today.  She discontinued Rexulti  three weeks ago without any worsening of her depression symptoms. She remains engaged in social activities and manages household tasks effectively. Her motivation and appetite are stable, although she has gained approximately five pounds over the past two months while on Rexulti . She is mindful of her diet and stays active by going to the gym and performing arm weight exercises.  Denies thoughts of self-harm or harm to others.  She has been attending physical therapy for a herniated disc in her  neck, which was causing stiffness but not significant pain. After completing seven sessions, she reports improvement in neck mobility and performs prescribed exercises twice daily. No falls or syncopal episodes have been reported.  Her current medications include lexapro  20 mg daily, lamotrigine  25 mg twice daily, and mirtazapine  22.5 mg at bedtime.  She denies any current concerns or side effects.  She denies any other concerns today.    Visit Diagnosis:    ICD-10-CM   1. Recurrent major depressive disorder, in full remission (HCC)  F33.42     2. GAD (generalized anxiety disorder)  F41.1       Past Psychiatric History: I have reviewed past psychiatric history from progress note on 01/12/2020. Previously attempted TMS-07/17/2022-did not tolerate it. Past trials of medications like duloxetine , Zoloft , Wellbutrin , Klonopin , Seroquel, trazodone , zolpidem . Inpatient behavioral health admission-multiple in the past.  Most recent-old [facility]- 09/22/2023 - 09/30/2023.    Past Medical History:  Past Medical History:  Diagnosis Date   Depression 2000   Heart murmur    Hypercholesterolemia Osteoporosis   Hyperlipemia    Hypertension     Past Surgical History:  Procedure Laterality Date   ABDOMINAL HYSTERECTOMY  1980   COLONOSCOPY WITH PROPOFOL  N/A 03/05/2018   Procedure: COLONOSCOPY WITH PROPOFOL ;  Surgeon: Toledo, Ladell POUR, MD;  Location: [facility] ENDOSCOPY;  Service: Endoscopy;  Laterality: N/A;   COLONOSCOPY WITH PROPOFOL  N/A 04/23/2023   Procedure: COLONOSCOPY WITH PROPOFOL ;  Surgeon: Maryruth Ole DASEN, MD;  Location: [facility] ENDOSCOPY;  Service: Endoscopy;  Laterality: N/A;    Family Psychiatric History: I  reviewed family psychiatric history from progress note on 01/12/2020.  Family History:  Family History  Problem Relation Age of Onset   Heart disease Mother    Diabetes Mother    Heart disease Father    Diabetes Father    Colon cancer Other    Congestive Heart Failure  Brother    Colon cancer Brother    Mental illness Paternal Aunt    BRCA 1/2 Neg Hx    Breast cancer Neg Hx    Cowden syndrome Neg Hx    DES usage Neg Hx    Endometrial cancer Neg Hx    Li-Fraumeni syndrome Neg Hx    Ovarian cancer Neg Hx     Social History: I have reviewed social history from progress note on 01/12/2020. Social History   Socioeconomic History   Marital status: Married    Spouse name: robert   Number of children: 2   Years of education: Not on file   Highest education level: 12th grade  Occupational History   Not on file  Tobacco Use   Smoking status: Never   Smokeless tobacco: Never  Vaping Use   Vaping status: Never Used  Substance and Sexual Activity   Alcohol use: Yes    Alcohol/week: 0.0 standard drinks of alcohol    Comment: occasionally - socially during the summer a glass of wine or beer   Drug use: No   Sexual activity: Yes  Other Topics Concern   Not on file  Social History Narrative   Not on file   Social Drivers of Health   Financial Resource Strain: Low Risk  (12/17/2023)   Overall Financial Resource Strain (CARDIA)    Difficulty of Paying Living Expenses: Not hard at all  Food Insecurity: No Food Insecurity (12/17/2023)   Hunger Vital Sign    Worried About Running Out of Food in the Last Year: Never true    Ran Out of Food in the Last Year: Never true  Transportation Needs: No Transportation Needs (12/17/2023)   PRAPARE - Administrator, Civil Service (Medical): No    Lack of Transportation (Non-Medical): No  Physical Activity: Unknown (12/17/2023)   Exercise Vital Sign    Days of Exercise per Week: 0 days    Minutes of Exercise per Session: Not on file  Recent Concern: Physical Activity - Inactive (12/17/2023)   Exercise Vital Sign    Days of Exercise per Week: 0 days    Minutes of Exercise per Session: 30 min  Stress: Stress Concern Present (12/17/2023)   [employer] of Occupational Health - Occupational Stress  Questionnaire    Feeling of Stress : Rather much  Social Connections: Socially Integrated (12/17/2023)   Social Connection and Isolation Panel    Frequency of Communication with Friends and Family: Twice a week    Frequency of Social Gatherings with Friends and Family: More than three times a week    Attends Religious Services: More than 4 times per year    Active Member of Clubs or Organizations: Yes    Attends Banker Meetings: More than 4 times per year    Marital Status: Married    Allergies: No Known Allergies  Metabolic Disorder Labs: Lab Results  Component Value Date   HGBA1C 6.2 05/09/2024   No results found for: PROLACTIN Lab Results  Component Value Date   CHOL 233 (H) 05/09/2024   TRIG 90.0 05/09/2024   HDL 68.50 05/09/2024   CHOLHDL 3 05/09/2024  VLDL 18.0 05/09/2024   LDLCALC 147 (H) 05/09/2024   LDLCALC 63 12/18/2023   Lab Results  Component Value Date   TSH 1.74 12/20/2023   TSH 1.860 08/10/2023    Therapeutic Level Labs: No results found for: LITHIUM No results found for: VALPROATE No results found for: CBMZ  Current Medications: Current Outpatient Medications  Medication Sig Dispense Refill   Cyanocobalamin  (VITAMIN B-12) 3000 MCG/ML LIQD Place under the tongue every 30 (thirty) days.     calcium  carbonate (OS-CAL) 600 MG TABS tablet Take 600 mg by mouth 2 (two) times daily with a meal.     escitalopram  (LEXAPRO ) 20 MG tablet Take 1 tablet (20 mg total) by mouth daily. 90 tablet 3   gatifloxacin (ZYMAXID) 0.5 % SOLN SMARTSIG:In Eye(s)     ketorolac (ACULAR) 0.5 % ophthalmic solution SMARTSIG:In Eye(s)     lamoTRIgine  (LAMICTAL ) 25 MG tablet Take 1 tablet (25 mg total) by mouth 2 (two) times daily. 60 tablet 2   losartan  (COZAAR ) 25 MG tablet TAKE 1 TABLET BY MOUTH ONCE DAILY 90 tablet 1   mirtazapine  (REMERON ) 15 MG tablet TAKE 1 and 1/2 TABLETS BY MOUTH AT BEDTIME . AS DIRECTED DOSE CHANGE ** STOP 30 MG 45 tablet 1    pantoprazole  (PROTONIX ) 20 MG tablet Take 1 tablet (20 mg total) by mouth daily. 90 tablet 1   prednisoLONE acetate (PRED FORTE) 1 % ophthalmic suspension SMARTSIG:In Eye(s)     rosuvastatin  (CRESTOR ) 20 MG tablet Take 1 tablet (20 mg total) by mouth at bedtime. 90 tablet 3   VITAMIN D  PO Take by mouth daily.     No current facility-administered medications for this visit.     Musculoskeletal: Strength & Muscle Tone: UTA Gait & Station: Seated Patient leans: N/A  Psychiatric Specialty Exam: Review of Systems  Psychiatric/Behavioral: Negative.      Last menstrual period 11/27/1979.There is no height or weight on file to calculate BMI.  General Appearance: Casual  Eye Contact:  Fair  Speech:  Normal Rate  Volume:  Normal  Mood:  Euthymic  Affect:  Congruent  Thought Process:  Goal Directed and Descriptions of Associations: Intact  Orientation:  Full (Time, Place, and Person)  Thought Content: Logical   Suicidal Thoughts:  No  Homicidal Thoughts:  No  Memory:  Immediate;   Fair Recent;   Fair Remote;   Fair  Judgement:  Fair  Insight:  Fair  Psychomotor Activity:  Normal  Concentration:  Concentration: Fair and Attention Span: Fair  Recall:  [employer] of Knowledge: Fair  Language: Fair  Akathisia:  No  Handed:  Right  AIMS (if indicated): not done  Assets:  Communication Skills Desire for Improvement Housing Intimacy Social Support Talents/Skills Transportation  ADL's:  Intact  Cognition: WNL  Sleep:  Fair   Screenings: Geneticist, molecular Office Visit from 10/02/2023 in [facility] Office Visit from 08/28/2023 in [facility] Office Visit from 06/20/2022 in [facility] Office Visit from 04/24/2022 in [facility]  AIMS Total Score 0 0 0 0   AUDIT    Flowsheet Row Admission (Discharged) from  05/12/2022 in [facility] Admission (Discharged) from 12/17/2019 in [facility] INPATIENT BE[facility]AVIORAL MEDICINE Admission (Discharged) [facility]  Alcohol Use Disorder Identification Test Final Score (AUDIT) 0 0 0   GAD-7  Flowsheet Row Office Visit from 03/14/2024 in [facility] [employer] Visit from 01/03/2024 in [facility] [employer] Video Visit from 11/26/2023 in [facility] Video Visit from 10/19/2023 in [facility] Office Visit from 10/02/2023 in [facility]  Total GAD-7 Score 0 3 3 9 9    Mini-Mental    Flowsheet Row Clinical Support from 02/27/2018 in [facility] Clinical Support from 02/02/2017 in [facility] [employer] Clinical Support from 02/03/2016 in [facility] [employer]  Total Score (max 30 points ) 30 30 30    PHQ2-9    Flowsheet Row Video Visit from 04/15/2024 in [facility] Office Visit from 03/14/2024 in [facility] Visit from 02/14/2024 in [facility] [place] [facility] Video Visit from 01/14/2024 in [facility] Office Visit from 01/03/2024 in [facility]  PHQ-2 Total Score 2 0 4 2 2   PHQ-9 Total Score 3 0 11 7 8    Flowsheet Row Video Visit from 06/09/2024 in [facility] Video Visit from 04/15/2024 in [facility] Video Visit from 03/03/2024 in [facility]  C-SSRS RISK CATEGORY No Risk No Risk No Risk     Assessment  and Plan: EDRA Robertson is a 78 year old Caucasian female, was evaluated by telemedicine today.  Discussed assessment and plan as noted below.  MDD in remission Currently reports mood symptoms as overall stable on the current medication regimen.  Was able to taper off Rexulti  and has not noticed any worsening mood symptoms. Continue Lamictal  25 mg twice daily Continue Mirtazapine  22.5 mg at bedtime Continue Lexapro  20 mg daily  Generalized anxiety disorder-stable Currently denies any significant anxiety symptoms. Continue Lexapro  20 mg daily Continue Mirtazapine  as prescribed Continue psychotherapy sessions with Ms. Delon at [facility].  Follow-up Follow-up in clinic in 3 months or sooner in person.  Collaboration of Care: Collaboration of Care: Referral or follow-up with counselor/therapist AEB patient to continue psychotherapy sessions as needed.  Patient/Guardian was advised Release of Information must be obtained prior to any record release in order to collaborate their care with an outside provider. Patient/Guardian was advised if they have not already done so to contact the registration department to sign all necessary forms in order for us  to release information regarding their care.   Consent: Patient/Guardian gives verbal consent for treatment and assignment of benefits for services provided during this visit. Patient/Guardian expressed understanding and agreed to proceed.  This note was generated in part or whole with voice recognition software. Voice recognition is usually quite accurate but there are transcription errors that can and very often do occur. I apologize for any typographical errors that were not detected and corrected.     Zelia Yzaguirre, MD 06/10/2024, 10:05 AM

## 2024-06-10 DIAGNOSIS — M47814 Spondylosis without myelopathy or radiculopathy, thoracic region: Secondary | ICD-10-CM | POA: Diagnosis not present

## 2024-06-10 DIAGNOSIS — M5412 Radiculopathy, cervical region: Secondary | ICD-10-CM | POA: Diagnosis not present

## 2024-06-10 DIAGNOSIS — M5020 Other cervical disc displacement, unspecified cervical region: Secondary | ICD-10-CM | POA: Diagnosis not present

## 2024-06-10 DIAGNOSIS — M5380 Other specified dorsopathies, site unspecified: Secondary | ICD-10-CM | POA: Diagnosis not present

## 2024-06-10 DIAGNOSIS — M4802 Spinal stenosis, cervical region: Secondary | ICD-10-CM | POA: Diagnosis not present

## 2024-06-10 HISTORY — PX: CATARACT EXTRACTION: SUR2

## 2024-06-12 DIAGNOSIS — M542 Cervicalgia: Secondary | ICD-10-CM | POA: Diagnosis not present

## 2024-06-16 DIAGNOSIS — M542 Cervicalgia: Secondary | ICD-10-CM | POA: Diagnosis not present

## 2024-06-18 DIAGNOSIS — M542 Cervicalgia: Secondary | ICD-10-CM | POA: Diagnosis not present

## 2024-06-23 DIAGNOSIS — M542 Cervicalgia: Secondary | ICD-10-CM | POA: Diagnosis not present

## 2024-06-25 DIAGNOSIS — M542 Cervicalgia: Secondary | ICD-10-CM | POA: Diagnosis not present

## 2024-06-26 DIAGNOSIS — H25042 Posterior subcapsular polar age-related cataract, left eye: Secondary | ICD-10-CM | POA: Diagnosis not present

## 2024-06-26 DIAGNOSIS — H2512 Age-related nuclear cataract, left eye: Secondary | ICD-10-CM | POA: Diagnosis not present

## 2024-06-26 DIAGNOSIS — H25012 Cortical age-related cataract, left eye: Secondary | ICD-10-CM | POA: Diagnosis not present

## 2024-06-30 DIAGNOSIS — M542 Cervicalgia: Secondary | ICD-10-CM | POA: Diagnosis not present

## 2024-07-01 ENCOUNTER — Ambulatory Visit
Admission: RE | Admit: 2024-07-01 | Discharge: 2024-07-01 | Disposition: A | Discharge status: Home / Self Care | Source: Ambulatory Visit | Attending: Internal Medicine | Admitting: Internal Medicine

## 2024-07-01 DIAGNOSIS — Z1231 Encounter for screening mammogram for malignant neoplasm of breast: Secondary | ICD-10-CM | POA: Diagnosis not present

## 2024-07-02 DIAGNOSIS — M542 Cervicalgia: Secondary | ICD-10-CM | POA: Diagnosis not present

## 2024-07-03 DIAGNOSIS — H2512 Age-related nuclear cataract, left eye: Secondary | ICD-10-CM | POA: Diagnosis not present

## 2024-07-07 ENCOUNTER — Ambulatory Visit

## 2024-07-09 ENCOUNTER — Other Ambulatory Visit: Payer: Self-pay | Admitting: Psychiatry

## 2024-07-09 DIAGNOSIS — F3341 Major depressive disorder, recurrent, in partial remission: Secondary | ICD-10-CM

## 2024-07-09 DIAGNOSIS — F331 Major depressive disorder, recurrent, moderate: Secondary | ICD-10-CM | POA: Diagnosis not present

## 2024-07-09 DIAGNOSIS — F411 Generalized anxiety disorder: Secondary | ICD-10-CM

## 2024-07-09 DIAGNOSIS — F4323 Adjustment disorder with mixed anxiety and depressed mood: Secondary | ICD-10-CM | POA: Diagnosis not present

## 2024-07-10 ENCOUNTER — Ambulatory Visit (INDEPENDENT_AMBULATORY_CARE_PROVIDER_SITE_OTHER)

## 2024-07-10 DIAGNOSIS — E538 Deficiency of other specified B group vitamins: Secondary | ICD-10-CM | POA: Diagnosis not present

## 2024-07-10 MED ORDER — CYANOCOBALAMIN 1000 MCG/ML IJ SOLN
1000.0000 ug | Freq: Once | INTRAMUSCULAR | Status: AC
  Administered 2024-07-10: 1000 ug via INTRAMUSCULAR

## 2024-07-10 NOTE — Progress Notes (Signed)
Per orders of Dr. Lorin Picket, injection of B-12 given by Vertis Kelch in left deltoid. Patient tolerated injection well.

## 2024-07-23 ENCOUNTER — Ambulatory Visit: Admitting: *Deleted

## 2024-07-23 VITALS — Ht 63.0 in | Wt 130.0 lb

## 2024-07-23 DIAGNOSIS — Z0000 Encounter for general adult medical examination without abnormal findings: Secondary | ICD-10-CM | POA: Diagnosis not present

## 2024-07-23 NOTE — Progress Notes (Signed)
 Subjective:   Angelica Robertson is a 78 y.o. who presents for a Medicare Wellness preventive visit.  As a reminder, Annual Wellness Visits don't include a physical exam, and some assessments may be limited, especially if this visit is performed virtually. We may recommend an in-person follow-up visit with your provider if needed.  Visit Complete: Virtual I connected with  Angelica Robertson on 07/23/24 by a audio enabled telemedicine application and verified that I am speaking with the correct person using two identifiers.  Patient Location: Home  Provider Location: Home Office  I discussed the limitations of evaluation and management by telemedicine. The patient expressed understanding and agreed to proceed.  Vital Signs: Because this visit was a virtual/telehealth visit, some criteria may be missing or patient reported. Any vitals not documented were not able to be obtained and vitals that have been documented are patient reported.  VideoDeclined- This patient declined Librarian, academic. Therefore the visit was completed with audio only.  Persons Participating in Visit: Patient.  AWV Questionnaire: Yes: Patient Medicare AWV questionnaire was completed by the patient on 07/22/24; I have confirmed that all information answered by patient is correct and no changes since this date.  Cardiac Risk Factors include: advanced age (>52men, >39 women);diabetes mellitus;hypertension;dyslipidemia     Objective:    Today's Vitals   07/23/24 0937  Weight: 130 lb (59 kg)  Height: 5' 3 (1.6 m)   Body mass index is 23.03 kg/m.     07/23/2024    9:56 AM 09/21/2023    9:50 AM 04/23/2023    8:16 AM 02/12/2023    3:37 PM 05/12/2022    3:00 PM 05/12/2022    9:25 AM 12/22/2019    9:32 AM  Advanced Directives  Does Patient Have a Medical Advance Directive? Yes Yes Yes Yes  Yes   Type of Estate agent of [place];Living will Healthcare Power of [place];Living  will Healthcare Power of [place];Living will Healthcare Power of [place];Living will  Living will;Healthcare Power of Attorney   Does patient want to make changes to medical advance directive? No - Patient declined   No - Patient declined  No - Patient declined   Copy of Healthcare Power of Attorney in Chart? Yes - validated most recent copy scanned in chart (See row information)  No - copy requested Yes - validated most recent copy scanned in chart (See row information)     Would patient like information on creating a medical advance directive?            Information is confidential and restricted. Go to Review Flowsheets to unlock data.    Current Medications (verified) Outpatient Encounter Medications as of 07/23/2024  Medication Sig   calcium  carbonate (OS-CAL) 600 MG TABS tablet Take 600 mg by mouth 2 (two) times daily with a meal.   cyanocobalamin  (VITAMIN B12) 1000 MCG/ML injection Inject 1,000 mcg into the muscle every 30 (thirty) days.   escitalopram  (LEXAPRO ) 20 MG tablet Take 1 tablet (20 mg total) by mouth daily.   ketorolac (ACULAR) 0.5 % ophthalmic solution SMARTSIG:In Eye(s)   lamoTRIgine  (LAMICTAL ) 25 MG tablet Take 1 tablet (25 mg total) by mouth 2 (two) times daily.   losartan  (COZAAR ) 25 MG tablet TAKE 1 TABLET BY MOUTH ONCE DAILY   mirtazapine  (REMERON ) 15 MG tablet TAKE 1 and 1/2 TABLETS BY MOUTH AT BEDTIME . AS DIRECTED DOSE CHANGE ** STOP 30 MG   rosuvastatin  (CRESTOR ) 20 MG tablet Take 1  tablet (20 mg total) by mouth at bedtime.   VITAMIN D  PO Take by mouth daily.   gatifloxacin (ZYMAXID) 0.5 % SOLN SMARTSIG:In Eye(s) (Patient not taking: Reported on 07/23/2024)   pantoprazole  (PROTONIX ) 20 MG tablet Take 1 tablet (20 mg total) by mouth daily. (Patient not taking: Reported on 07/23/2024)   prednisoLONE acetate (PRED FORTE) 1 % ophthalmic suspension SMARTSIG:In Eye(s) (Patient not taking: Reported on 07/23/2024)   [DISCONTINUED] Cyanocobalamin  (VITAMIN B-12) 3000 MCG/ML  LIQD Place under the tongue every 30 (thirty) days.   No facility-administered encounter medications on file as of 07/23/2024.    Allergies (verified) Patient has no known allergies.   History: Past Medical History:  Diagnosis Date   Depression 2000   Heart murmur    Hypercholesterolemia Osteoporosis   Hyperlipemia    Hypertension    Past Surgical History:  Procedure Laterality Date   ABDOMINAL HYSTERECTOMY  12/11/1978   CATARACT EXTRACTION Right 04/2024   CATARACT EXTRACTION Left 06/2024   COLONOSCOPY WITH PROPOFOL  N/A 03/05/2018   Procedure: COLONOSCOPY WITH PROPOFOL ;  Surgeon: Toledo, Ladell POUR, MD;  Location: [facility] ENDOSCOPY;  Service: Endoscopy;  Laterality: N/A;   COLONOSCOPY WITH PROPOFOL  N/A 04/23/2023   Procedure: COLONOSCOPY WITH PROPOFOL ;  Surgeon: Maryruth Ole DASEN, MD;  Location: [facility] ENDOSCOPY;  Service: Endoscopy;  Laterality: N/A;   Family History  Problem Relation Age of Onset   Heart disease Mother    Diabetes Mother    Heart disease Father    Diabetes Father    Colon cancer Other    Congestive Heart Failure Brother    Colon cancer Brother    Mental illness Paternal Aunt    BRCA 1/2 Neg Hx    Breast cancer Neg Hx    Cowden syndrome Neg Hx    DES usage Neg Hx    Endometrial cancer Neg Hx    Li-Fraumeni syndrome Neg Hx    Ovarian cancer Neg Hx    Social History   Socioeconomic History   Marital status: Married    Spouse name: robert   Number of children: 2   Years of education: Not on file   Highest education level: 12th grade  Occupational History   Not on file  Tobacco Use   Smoking status: Never   Smokeless tobacco: Never  Vaping Use   Vaping status: Never Used  Substance and Sexual Activity   Alcohol use: Yes    Alcohol/week: 0.0 standard drinks of alcohol    Comment: occasionally - socially during the summer a glass of wine or beer   Drug use: No   Sexual activity: Yes  Other Topics Concern   Not on file  Social History  Narrative   married   Social Drivers of Corporate investment banker Strain: Low Risk  (07/22/2024)   Overall Financial Resource Strain (CARDIA)    Difficulty of Paying Living Expenses: Not hard at all  Food Insecurity: No Food Insecurity (07/22/2024)   Hunger Vital Sign    Worried About Running Out of Food in the Last Year: Never true    Ran Out of Food in the Last Year: Never true  Transportation Needs: No Transportation Needs (07/22/2024)   PRAPARE - Administrator, Civil Service (Medical): No    Lack of Transportation (Non-Medical): No  Physical Activity: Inactive (07/22/2024)   Exercise Vital Sign    Days of Exercise per Week: 0 days    Minutes of Exercise per Session: 0 min  Stress: Stress  Concern Present (07/22/2024)   [employer] of Occupational Health - Occupational Stress Questionnaire    Feeling of Stress: To some extent  Social Connections: Socially Integrated (07/22/2024)   Social Connection and Isolation Panel    Frequency of Communication with Friends and Family: Once a week    Frequency of Social Gatherings with Friends and Family: Three times a week    Attends Religious Services: More than 4 times per year    Active Member of Clubs or Organizations: Yes    Attends Banker Meetings: More than 4 times per year    Marital Status: Married    Tobacco Counseling Counseling given: Not Answered    Clinical Intake:  Pre-visit preparation completed: Yes  Pain : No/denies pain     BMI - recorded: 23.03 Nutritional Status: BMI of 19-24  Normal Nutritional Risks: None Diabetes: No  Lab Results  Component Value Date   HGBA1C 6.2 05/09/2024   HGBA1C 6.5 12/18/2023   HGBA1C 6.0 (H) 08/10/2023     How often do you need to have someone help you when you read instructions, pamphlets, or other written materials from your doctor or pharmacy?: 1 - Never  Interpreter Needed?: No  Information entered by :: R. Marnae Madani LPN   Activities of  Daily Living     07/23/2024    9:42 AM  In your present state of health, do you have any difficulty performing the following activities:  Hearing? 0  Vision? 0  Difficulty concentrating or making decisions? 0  Walking or climbing stairs? 0  Dressing or bathing? 0  Doing errands, shopping? 0  Preparing Food and eating ? N  Using the Toilet? N  In the past six months, have you accidently leaked urine? N  Do you have problems with loss of bowel control? N  Managing your Medications? N  Managing your Finances? N  Housekeeping or managing your Housekeeping? N    Patient Care Team: Glendia Shad, MD as PCP - General (Internal Medicine) Maree Jannett POUR, MD as Consulting Physician (Neurology) Eappen, Saramma, MD as Consulting Physician (Psychiatry) Florencio Cara BIRCH, MD as Consulting Physician (Cardiology)  I have updated your Care Teams any recent Medical Services you may have received from other providers in the past year.     Assessment:   This is a routine wellness examination for Kaylany.  Hearing/Vision screen Hearing Screening - Comments:: No issues Vision Screening - Comments:: readers   Goals Addressed             This Visit's Progress    Patient Stated       Wants to get back into exercising       Depression Screen     07/23/2024    9:51 AM 04/15/2024   10:31 AM 03/14/2024   11:26 AM 02/14/2024    2:12 PM 01/14/2024    4:10 PM 01/03/2024    4:18 PM 12/20/2023    1:29 PM  PHQ 2/9 Scores  PHQ - 2 Score 0  0 4  2 3   PHQ- 9 Score 0  0 11  8 11      Information is confidential and restricted. Go to Review Flowsheets to unlock data.    Fall Risk     07/23/2024    9:43 AM 03/14/2024   11:26 AM 01/03/2024    4:17 PM 12/20/2023    1:28 PM 09/04/2023    9:56 AM  Fall Risk   Falls in the past year? 0 0  0 1 0  Number falls in past yr: 0 0 0 0 0  Injury with Fall? 0 0 1 1 0  Risk for fall due to : No Fall Risks No Fall Risks History of fall(s)  No Fall Risks  Follow up  Falls evaluation completed;Falls prevention discussed Falls evaluation completed Falls evaluation completed Falls evaluation completed Falls evaluation completed    MEDICARE RISK AT HOME:  Medicare Risk at Home Any stairs in or around the home?: Yes If so, are there any without handrails?: No Home free of loose throw rugs in walkways, pet beds, electrical cords, etc?: Yes Adequate lighting in your home to reduce risk of falls?: Yes Life alert?: No Use of a cane, walker or w/c?: No Grab bars in the bathroom?: Yes Shower chair or bench in shower?: No Elevated toilet seat or a handicapped toilet?: Yes  TIMED UP AND GO:  Was the test performed?  No  Cognitive Function: 6CIT completed    02/27/2018    3:29 PM 02/02/2017    2:35 PM 02/03/2016    1:20 PM  MMSE - Mini Mental State Exam  Orientation to time 5 5  5    Orientation to Place 5 5  5    Registration 3 3  3    Attention/ Calculation 5 5  5    Recall 3 3  3    Language- name 2 objects 2 2  2    Language- repeat 1 1 1   Language- follow 3 step command 3 3  3    Language- read & follow direction 1 1  1    Write a sentence 1 1  1    Copy design 1 1  1    Total score 30 30  30       Data saved with a previous flowsheet row definition        07/23/2024    9:56 AM 02/12/2023    3:38 PM 03/11/2019   10:23 AM  6CIT Screen  What Year? 0 points 0 points 0 points  What month? 0 points 0 points 0 points  What time? 0 points 0 points 0 points  Count back from 20 0 points 0 points 0 points  Months in reverse 0 points 0 points 0 points  Repeat phrase 0 points 0 points 0 points  Total Score 0 points 0 points 0 points    Immunizations Immunization History  Administered Date(s) Administered   Fluad Quad(high Dose 65+) 09/02/2019, 09/01/2020, 08/18/2022   Fluad Trivalent(High Dose 65+) 09/04/2023   Influenza Nasal 09/09/2012   Influenza Split 01/07/2013, 09/15/2013   Influenza, High Dose Seasonal PF 09/25/2017, 10/01/2018    Influenza,inj,Quad PF,6+ Mos 08/13/2014, 08/26/2015   Influenza-Unspecified 10/20/2016, 09/09/2021   Moderna SARS-COV2 Booster Vaccination 03/28/2021   Moderna Sars-Covid-2 Vaccination 01/21/2020, 02/18/2020, 10/21/2020   PFIZER(Purple Top)SARS-COV-2 Vaccination 09/13/2021   Pneumococcal Conjugate-13 11/25/2013   Pneumococcal Polysaccharide-23 02/24/2016   Tdap 02/29/2016   Zoster, Live 02/29/2016    Screening Tests Health Maintenance  Topic Date Due   Diabetic kidney evaluation - Urine ACR  Never done   Zoster Vaccines- Shingrix (1 of 2) 08/07/1967   COVID-19 Vaccine (5 - 2024-25 season) 08/12/2023   OPHTHALMOLOGY EXAM  11/22/2023   Medicare Annual Wellness (AWV)  02/12/2024   INFLUENZA VACCINE  07/11/2024   HEMOGLOBIN A1C  11/09/2024   Diabetic kidney evaluation - eGFR measurement  05/09/2025   FOOT EXAM  05/16/2025   MAMMOGRAM  07/01/2025   DTaP/Tdap/Td (2 - Td or Tdap) 02/28/2026   Colonoscopy  04/22/2033   Pneumococcal Vaccine: 50+ Years  Completed   DEXA SCAN  Completed   Hepatitis C Screening  Completed   Hepatitis B Vaccines  Aged Out   HPV VACCINES  Aged Out   Meningococcal B Vaccine  Aged Out    Health Maintenance  Health Maintenance Due  Topic Date Due   Diabetic kidney evaluation - Urine ACR  Never done   Zoster Vaccines- Shingrix (1 of 2) 08/07/1967   COVID-19 Vaccine (5 - 2024-25 season) 08/12/2023   OPHTHALMOLOGY EXAM  11/22/2023   Medicare Annual Wellness (AWV)  02/12/2024   INFLUENZA VACCINE  07/11/2024   Health Maintenance Items Addressed: Patient declines covid and shingles vaccines at this time.  Reminded patient to get her annual flu vaccine  Additional Screening:  Vision Screening: Recommended annual ophthalmology exams for early detection of glaucoma and other disorders of the eye. [employer].  Discussed the need to get a diabetic eye exam.  Would you like a referral to an eye doctor? No    Dental Screening: Recommended annual dental exams  for proper oral hygiene  Community Resource Referral / Chronic Care Management: CRR required this visit?  No   CCM required this visit?  No   Plan:    I have personally reviewed and noted the following in the patient's chart:   Medical and social history Use of alcohol, tobacco or illicit drugs  Current medications and supplements including opioid prescriptions. Patient is not currently taking opioid prescriptions. Functional ability and status Nutritional status Physical activity Advanced directives List of other physicians Hospitalizations, surgeries, and ER visits in previous 12 months Vitals Screenings to include cognitive, depression, and falls Referrals and appointments  In addition, I have reviewed and discussed with patient certain preventive protocols, quality metrics, and best practice recommendations. A written personalized care plan for preventive services as well as general preventive health recommendations were provided to patient.   Angeline Fredericks, LPN   [id number]/[id number]   After Visit Summary: (MyChart) Due to this being a telephonic visit, the after visit summary with patients personalized plan was offered to patient via MyChart   Notes: Nothing significant to report at this time.

## 2024-07-23 NOTE — Patient Instructions (Signed)
 Angelica Robertson , Thank you for taking time out of your busy schedule to complete your Annual Wellness Visit with me. I enjoyed our conversation and look forward to speaking with you again next year. I, as well as your care team,  appreciate your ongoing commitment to your health goals. Please review the following plan we discussed and let me know if I can assist you in the future. Your Game plan/ To Do List    Referrals: If you haven't heard from the office you've been referred to, please reach out to them at the phone provided.  Remember to call and schedule a diabetic eye exam and get your annual flu vaccine   Follow up Visits: We will see or speak with you next year for your Next Medicare AWV with our clinical staff 07/28/25 @ 1:00 Have you seen your provider in the last 6 months (3 months if uncontrolled diabetes)? Yes  Clinician Recommendations:  Aim for 30 minutes of exercise or brisk walking, 6-8 glasses of water, and 5 servings of fruits and vegetables each day.       This is a list of the screenings recommended for you:  Health Maintenance  Topic Date Due   Yearly kidney health urinalysis for diabetes  Never done   Zoster (Shingles) Vaccine (1 of 2) 08/07/1967   COVID-19 Vaccine (5 - 2024-25 season) 08/12/2023   Eye exam for diabetics  11/22/2023   Flu Shot  07/11/2024   Hemoglobin A1C  11/09/2024   Yearly kidney function blood test for diabetes  05/09/2025   Complete foot exam   05/16/2025   Mammogram  07/01/2025   Medicare Annual Wellness Visit  07/23/2025   DTaP/Tdap/Td vaccine (2 - Td or Tdap) 02/28/2026   Colon Cancer Screening  04/22/2033   Pneumococcal Vaccine for age over 74  Completed   DEXA scan (bone density measurement)  Completed   Hepatitis C Screening  Completed   Hepatitis B Vaccine  Aged Out   HPV Vaccine  Aged Out   Meningitis B Vaccine  Aged Out    Advanced directives: (In Chart) A copy of your advanced directives are scanned into your chart should your  provider ever need it. Advance Care Planning is important because it:  [x]  Makes sure you receive the medical care that is consistent with your values, goals, and preferences  [x]  It provides guidance to your family and loved ones and reduces their decisional burden about whether or not they are making the right decisions based on your wishes.  Follow the link provided in your after visit summary or read over the paperwork we have mailed to you to help you started getting your Advance Directives in place. If you need assistance in completing these, please reach out to us  so that we can help you!

## 2024-07-31 DIAGNOSIS — R160 Hepatomegaly, not elsewhere classified: Secondary | ICD-10-CM | POA: Diagnosis not present

## 2024-07-31 DIAGNOSIS — E1165 Type 2 diabetes mellitus with hyperglycemia: Secondary | ICD-10-CM | POA: Diagnosis not present

## 2024-07-31 DIAGNOSIS — I10 Essential (primary) hypertension: Secondary | ICD-10-CM | POA: Diagnosis not present

## 2024-07-31 DIAGNOSIS — R531 Weakness: Secondary | ICD-10-CM | POA: Diagnosis not present

## 2024-07-31 DIAGNOSIS — E782 Mixed hyperlipidemia: Secondary | ICD-10-CM | POA: Diagnosis not present

## 2024-07-31 DIAGNOSIS — R55 Syncope and collapse: Secondary | ICD-10-CM | POA: Diagnosis not present

## 2024-08-14 ENCOUNTER — Ambulatory Visit

## 2024-08-14 DIAGNOSIS — E538 Deficiency of other specified B group vitamins: Secondary | ICD-10-CM

## 2024-08-14 MED ORDER — CYANOCOBALAMIN 1000 MCG/ML IJ SOLN
1000.0000 ug | Freq: Once | INTRAMUSCULAR | Status: AC
  Administered 2024-08-14: 1000 ug via INTRAMUSCULAR

## 2024-08-14 NOTE — Progress Notes (Signed)
Pt received B12 injection in right deltoid. Pt tolerated it well with no complaints or concerns. 

## 2024-08-20 DIAGNOSIS — F330 Major depressive disorder, recurrent, mild: Secondary | ICD-10-CM | POA: Diagnosis not present

## 2024-08-20 DIAGNOSIS — F4323 Adjustment disorder with mixed anxiety and depressed mood: Secondary | ICD-10-CM | POA: Diagnosis not present

## 2024-09-10 ENCOUNTER — Encounter: Payer: Self-pay | Admitting: Psychiatry

## 2024-09-10 ENCOUNTER — Ambulatory Visit: Admitting: Psychiatry

## 2024-09-10 ENCOUNTER — Other Ambulatory Visit: Payer: Self-pay

## 2024-09-10 VITALS — BP 159/79 | HR 69 | Temp 97.4°F | Ht 63.0 in | Wt 135.4 lb

## 2024-09-10 DIAGNOSIS — F3342 Major depressive disorder, recurrent, in full remission: Secondary | ICD-10-CM

## 2024-09-10 DIAGNOSIS — F411 Generalized anxiety disorder: Secondary | ICD-10-CM | POA: Diagnosis not present

## 2024-09-10 MED ORDER — MIRTAZAPINE 15 MG PO TABS
22.5000 mg | ORAL_TABLET | Freq: Every day | ORAL | 1 refills | Status: AC
Start: 2024-09-10 — End: ?

## 2024-09-10 MED ORDER — LAMOTRIGINE 25 MG PO TABS: 25.0000 mg | ORAL_TABLET | Freq: Two times a day (BID) | ORAL | 1 refills | Status: AC

## 2024-09-10 NOTE — Progress Notes (Unsigned)
 BH MD OP Progress Note  09/10/2024 11:57 AM Angelica Robertson  MRN:  [id number]  Chief Complaint:  Chief Complaint  Patient presents with   Follow-up   Anxiety   Depression   Medication Refill   Discussed the use of AI scribe software for clinical note transcription with the patient, who gave verbal consent to proceed.  History of Present Illness Angelica Robertson is a 78 year old Caucasian female, married, retired, lives in [place], has a history of MDD, GAD, hyperlipidemia, arthritis was evaluated in office today for a follow-up appointment.  She reports doing well and returning to her baseline level of functioning. Engaging in activities such as attending church, participating in home groups, playing cards, going to [employer], cooking, and reading, she describes these as helpful for maintaining her well-being.  Regarding sleep, she notes feeling rested and energetic upon waking, without experiencing grogginess from her medications. She mentions a slight weight gain.  She confirms currently taking Lexapro  20 mg daily, Lamictal  25 mg twice daily, and mirtazapine  22.5 mg at bedtime. She also continues to see her therapist, Delon, at [facility].  Reflecting on her last episode of significant depression, she recalls it occurred around October of the previous year, describing it as severe and including memory changes and concentration problems. She reports not experiencing these symptoms recently.  She denies any other concerns today.  Visit Diagnosis:    ICD-10-CM   1. Recurrent major depressive disorder, in full remission  F33.42 lamoTRIgine  (LAMICTAL ) 25 MG tablet    2. GAD (generalized anxiety disorder)  F41.1 mirtazapine  (REMERON ) 15 MG tablet    lamoTRIgine  (LAMICTAL ) 25 MG tablet      Past Psychiatric History: I have reviewed past psychiatric history from progress note on 01/12/2020.  Previously attempted TMS-07/17/2022-did not tolerated.  Past trials of medications-duloxetine , Zoloft ,  Wellbutrin , Klonopin , Seroquel, trazodone , zolpidem .  Inpatient behavioral health admission-multiple in the past.  44 recent old [facility]-09/22/2023 - 09/30/2023.  Past Medical History:  Past Medical History:  Diagnosis Date   Depression 2000   Heart murmur    Hypercholesterolemia Osteoporosis   Hyperlipemia    Hypertension     Past Surgical History:  Procedure Laterality Date   ABDOMINAL HYSTERECTOMY  12/11/1978   CATARACT EXTRACTION Right 04/2024   CATARACT EXTRACTION Left 06/2024   COLONOSCOPY WITH PROPOFOL  N/A 03/05/2018   Procedure: COLONOSCOPY WITH PROPOFOL ;  Surgeon: Toledo, Ladell POUR, MD;  Location: [facility] ENDOSCOPY;  Service: Endoscopy;  Laterality: N/A;   COLONOSCOPY WITH PROPOFOL  N/A 04/23/2023   Procedure: COLONOSCOPY WITH PROPOFOL ;  Surgeon: Maryruth Ole DASEN, MD;  Location: [facility] ENDOSCOPY;  Service: Endoscopy;  Laterality: N/A;    Family Psychiatric History: I have reviewed family psychiatric history from progress note on 01/12/2020.  Family History:  Family History  Problem Relation Age of Onset   Heart disease Mother    Diabetes Mother    Heart disease Father    Diabetes Father    Colon cancer Other    Congestive Heart Failure Brother    Colon cancer Brother    Mental illness Paternal Aunt    BRCA 1/2 Neg Hx    Breast cancer Neg Hx    Cowden syndrome Neg Hx    DES usage Neg Hx    Endometrial cancer Neg Hx    Li-Fraumeni syndrome Neg Hx    Ovarian cancer Neg Hx     Social History: Reviewed social history from progress note on 01/12/2020. Social History   Socioeconomic History  Marital status: Married    Spouse name: robert   Number of children: 2   Years of education: Not on file   Highest education level: 12th grade  Occupational History   Not on file  Tobacco Use   Smoking status: Never   Smokeless tobacco: Never  Vaping Use   Vaping status: Never Used  Substance and Sexual Activity   Alcohol use: Yes    Alcohol/week: 0.0 standard  drinks of alcohol    Comment: occasionally - socially during the summer a glass of wine or beer   Drug use: No   Sexual activity: Yes  Other Topics Concern   Not on file  Social History Narrative   married   Social Drivers of Corporate investment banker Strain: Low Risk  (07/22/2024)   Overall Financial Resource Strain (CARDIA)    Difficulty of Paying Living Expenses: Not hard at all  Food Insecurity: No Food Insecurity (07/22/2024)   Hunger Vital Sign    Worried About Running Out of Food in the Last Year: Never true    Ran Out of Food in the Last Year: Never true  Transportation Needs: No Transportation Needs (07/22/2024)   PRAPARE - Administrator, Civil Service (Medical): No    Lack of Transportation (Non-Medical): No  Physical Activity: Inactive (07/22/2024)   Exercise Vital Sign    Days of Exercise per Week: 0 days    Minutes of Exercise per Session: 0 min  Stress: Stress Concern Present (07/22/2024)   [employer] of Occupational Health - Occupational Stress Questionnaire    Feeling of Stress: To some extent  Social Connections: Socially Integrated (07/22/2024)   Social Connection and Isolation Panel    Frequency of Communication with Friends and Family: Once a week    Frequency of Social Gatherings with Friends and Family: Three times a week    Attends Religious Services: More than 4 times per year    Active Member of Clubs or Organizations: Yes    Attends Banker Meetings: More than 4 times per year    Marital Status: Married    Allergies: No Known Allergies  Metabolic Disorder Labs: Lab Results  Component Value Date   HGBA1C 6.2 05/09/2024   No results found for: PROLACTIN Lab Results  Component Value Date   CHOL 233 (H) 05/09/2024   TRIG 90.0 05/09/2024   HDL 68.50 05/09/2024   CHOLHDL 3 05/09/2024   VLDL 18.0 05/09/2024   LDLCALC 147 (H) 05/09/2024   LDLCALC 63 12/18/2023   Lab Results  Component Value Date   TSH 1.74  12/20/2023   TSH 1.860 08/10/2023    Therapeutic Level Labs: No results found for: LITHIUM No results found for: VALPROATE No results found for: CBMZ  Current Medications: Current Outpatient Medications  Medication Sig Dispense Refill   calcium  carbonate (OS-CAL) 600 MG TABS tablet Take 600 mg by mouth 2 (two) times daily with a meal.     cyanocobalamin  (VITAMIN B12) 1000 MCG/ML injection Inject 1,000 mcg into the muscle every 30 (thirty) days.     escitalopram  (LEXAPRO ) 20 MG tablet Take 1 tablet (20 mg total) by mouth daily. 90 tablet 3   lamoTRIgine  (LAMICTAL ) 25 MG tablet Take 1 tablet (25 mg total) by mouth 2 (two) times daily. 180 tablet 1   losartan  (COZAAR ) 25 MG tablet TAKE 1 TABLET BY MOUTH ONCE DAILY 90 tablet 1   mirtazapine  (REMERON ) 15 MG tablet Take 1.5 tablets (22.5 mg total)  by mouth at bedtime. 135 tablet 1   rosuvastatin  (CRESTOR ) 20 MG tablet Take 1 tablet (20 mg total) by mouth at bedtime. 90 tablet 3   VITAMIN D  PO Take by mouth daily.     No current facility-administered medications for this visit.     Musculoskeletal: Strength & Muscle Tone: WNL Gait & Station: normal Patient leans: N/A  Psychiatric Specialty Exam: Review of Systems  Psychiatric/Behavioral: Negative.      Blood pressure (!) 159/79, pulse 69, temperature (!) 97.4 F (36.3 C), temperature source Temporal, height 5' 3 (1.6 m), weight 135 lb 6.4 oz (61.4 kg), last menstrual period 11/27/1979.Body mass index is 23.99 kg/m.  General Appearance: Casual  Eye Contact:  Fair  Speech:  Clear and Coherent  Volume:  Normal  Mood:  Euthymic  Affect:  Congruent  Thought Process:  Goal Directed and Descriptions of Associations: Intact  Orientation:  Full (Time, Place, and Person)  Thought Content: Logical   Suicidal Thoughts:  No  Homicidal Thoughts:  No  Memory:  Immediate;   Fair Recent;   Fair Remote;   Fair  Judgement:  Fair  [facility]:  Fair  Psychomotor Activity:  Normal   Concentration:  Concentration: Fair and Attention Span: Fair  Recall:  [employer] of Knowledge: Fair  Language: Fair  Akathisia:  No  Handed:  Right  AIMS (if indicated): not done  Assets:  Communication Skills Desire for Improvement Housing Talents/Skills  ADL's:  Intact  Cognition: WNL  Sleep:  Fair   Screenings: AIMS    Flowsheet Row Office Visit from 10/02/2023 in [facility] Office Visit from 08/28/2023 in [facility] Office Visit from 06/20/2022 in [facility] Office Visit from 04/24/2022 in [facility]  AIMS Total Score 0 0 0 0   AUDIT    Flowsheet Row Admission (Discharged) from 05/12/2022 in [facility] Admission (Discharged) from 12/17/2019 in [facility] INPATIENT BE[facility]AL MEDICINE Admission (Discharged) [facility]  Alcohol Use Disorder Identification Test Final Score (AUDIT) 0 0 0   GAD-7    Flowsheet Row Office Visit from 03/14/2024 in [facility] [employer] Visit from 01/03/2024 in [facility] [employer] Video Visit from 11/26/2023 in [facility] Video Visit from 10/19/2023 in [facility] Office Visit from 10/02/2023 in [facility]  Total GAD-7 Score 0 3 3 9 9    Mini-Mental    Flowsheet Row Clinical Support from 02/27/2018 in [facility] Clinical Support from 02/02/2017 in [facility] [employer] [facility] Clinical Support from 02/03/2016 in [facility] [employer]  Total Score (max 30 points ) 30 30 30    PHQ2-9    Flowsheet Row Clinical Support from  07/23/2024 in [facility] Video Visit from 04/15/2024 in [facility] Office Visit from 03/14/2024 in [facility] [employer] Visit from 02/14/2024 in [facility] [employer] Video Visit from 01/14/2024 in [facility]  PHQ-2 Total Score 0 2 0 4 2  PHQ-9 Total Score 0 3 0 11 7   Flowsheet Row Video Visit from 06/09/2024 in [facility] Video Visit from 04/15/2024 in [facility] Video Visit from 03/03/2024 in [facility]  C-SSRS RISK CATEGORY No Risk No Risk No Risk     Assessment and Plan: GIAVONNA PFLUM is a 78 year old Caucasian female who presents for a follow-up appointment.  Discussed assessment and plan as noted below.  1. Recurrent major depressive disorder, in full remission Currently denies any significant depression symptoms. Continue Lamictal  25 mg twice daily Continue Mirtazapine  22.5 mg at bedtime  2. GAD (generalized anxiety disorder)-stable Denies any significant anxiety symptoms. Continue Lexapro  20 mg daily Continue psychotherapy sessions with Ms. Delon at [facility].  Patient with elevated blood pressure reading in session today , patient to follow-up with primary care provider.  Patient does have upcoming appointment.  Follow-up Follow-up in clinic in 3 months or sooner if needed.    Collaboration of Care: Collaboration of Care: Referral or follow-up with counselor/therapist [facility]EB encouraged to continue psychotherapy sessions.  Patient/Guardian was advised Release of Information must be obtained prior to any record release in order to collaborate their care with an outside provider. Patient/Guardian was advised if they have not already done so to contact the registration  department to sign all necessary forms in order for us  to release information regarding their care.   Consent: Patient/Guardian gives verbal consent for treatment and assignment of benefits for services provided during this visit. Patient/Guardian expressed understanding and agreed to proceed.   This note was generated in part or whole with voice recognition software. Voice recognition is usually quite accurate but there are transcription errors that can and very often do occur. I apologize for any typographical errors that were not detected and corrected.    Staria Birkhead, MD 09/10/2024, 11:57 AM

## 2024-09-16 ENCOUNTER — Other Ambulatory Visit

## 2024-09-16 ENCOUNTER — Other Ambulatory Visit: Payer: Self-pay

## 2024-09-16 DIAGNOSIS — E7800 Pure hypercholesterolemia, unspecified: Secondary | ICD-10-CM | POA: Diagnosis not present

## 2024-09-16 DIAGNOSIS — E1165 Type 2 diabetes mellitus with hyperglycemia: Secondary | ICD-10-CM

## 2024-09-16 LAB — LIPID PANEL
Cholesterol: 135 mg/dL (ref 0–200)
HDL: 54.5 mg/dL (ref >39.00)
LDL Cholesterol: 57 mg/dL (ref 0–99)
NonHDL: 80.47
Total CHOL/HDL Ratio: 2
Triglycerides: 117 mg/dL (ref 0.0–149.0)
VLDL: 23.4 mg/dL (ref 0.0–40.0)

## 2024-09-16 LAB — HEMOGLOBIN A1C: Hgb A1c MFr Bld: 6.3 % (ref 4.6–6.5)

## 2024-09-16 LAB — BASIC METABOLIC PANEL WITH GFR
BUN: 14 mg/dL (ref 6–23)
CO2: 32 meq/L (ref 19–32)
Calcium: 10.2 mg/dL (ref 8.4–10.5)
Chloride: 102 meq/L (ref 96–112)
Creatinine, Ser: 0.81 mg/dL (ref 0.40–1.20)
GFR: 70.67 mL/min (ref >60.00)
Glucose, Bld: 107 mg/dL — ABNORMAL HIGH (ref 70–99)
Potassium: 4.1 meq/L (ref 3.5–5.1)
Sodium: 142 meq/L (ref 135–145)

## 2024-09-16 LAB — HEPATIC FUNCTION PANEL
ALT: 16 U/L (ref 0–35)
AST: 20 U/L (ref 0–37)
Albumin: 4.6 g/dL (ref 3.5–5.2)
Alkaline Phosphatase: 47 U/L (ref 39–117)
Bilirubin, Direct: 0.1 mg/dL (ref 0.0–0.3)
Total Bilirubin: 0.5 mg/dL (ref 0.2–1.2)
Total Protein: 6.4 g/dL (ref 6.0–8.3)

## 2024-09-17 ENCOUNTER — Ambulatory Visit: Payer: Self-pay | Admitting: Internal Medicine

## 2024-09-18 ENCOUNTER — Ambulatory Visit: Admitting: Internal Medicine

## 2024-09-18 ENCOUNTER — Encounter: Payer: Self-pay | Admitting: Pharmacist

## 2024-09-18 ENCOUNTER — Encounter: Payer: Self-pay | Admitting: Internal Medicine

## 2024-09-18 VITALS — BP 122/70 | HR 84 | Resp 16 | Ht 63.0 in | Wt 134.2 lb

## 2024-09-18 DIAGNOSIS — E7800 Pure hypercholesterolemia, unspecified: Secondary | ICD-10-CM

## 2024-09-18 DIAGNOSIS — M542 Cervicalgia: Secondary | ICD-10-CM | POA: Diagnosis not present

## 2024-09-18 DIAGNOSIS — I10 Essential (primary) hypertension: Secondary | ICD-10-CM | POA: Diagnosis not present

## 2024-09-18 DIAGNOSIS — R55 Syncope and collapse: Secondary | ICD-10-CM

## 2024-09-18 DIAGNOSIS — F332 Major depressive disorder, recurrent severe without psychotic features: Secondary | ICD-10-CM

## 2024-09-18 DIAGNOSIS — Z23 Encounter for immunization: Secondary | ICD-10-CM | POA: Diagnosis not present

## 2024-09-18 DIAGNOSIS — E538 Deficiency of other specified B group vitamins: Secondary | ICD-10-CM | POA: Diagnosis not present

## 2024-09-18 DIAGNOSIS — Z0000 Encounter for general adult medical examination without abnormal findings: Secondary | ICD-10-CM

## 2024-09-18 DIAGNOSIS — E1165 Type 2 diabetes mellitus with hyperglycemia: Secondary | ICD-10-CM | POA: Diagnosis not present

## 2024-09-18 LAB — MICROALBUMIN / CREATININE URINE RATIO
Creatinine,U: 99.8 mg/dL
Microalb Creat Ratio: 8.1 mg/g (ref 0.0–30.0)
Microalb, Ur: 0.8 mg/dL (ref 0.0–1.9)

## 2024-09-18 MED ORDER — CYANOCOBALAMIN 1000 MCG/ML IJ SOLN
1000.0000 ug | Freq: Once | INTRAMUSCULAR | Status: AC
  Administered 2024-09-18: 1000 ug via INTRAMUSCULAR

## 2024-09-18 MED ORDER — SHINGRIX 50 MCG/0.5ML IM SUSR: 0.5000 mL | Freq: Once | INTRAMUSCULAR | 0 refills | Status: DC

## 2024-09-18 MED ORDER — LOSARTAN POTASSIUM 25 MG PO TABS: ORAL_TABLET | ORAL | 3 refills | Status: AC

## 2024-09-18 NOTE — Assessment & Plan Note (Signed)
 Physical today 09/18/24.  Mammogram 06/26/23- Bir[facility] 0. Diagnostic mammogram left breast 07/02/23 - Birads I.  Recommended continuing annual screening mammogram. F/u mammogram 07/04/24 - Birads I. Colonoscopy 03/05/18.

## 2024-09-18 NOTE — Assessment & Plan Note (Signed)
 Followed by Dr Coby.  Continues on lamictal  and remeron . Continues on rexulti . Doing better. Feels better.  Continue f/u with psychiatry.

## 2024-09-18 NOTE — Assessment & Plan Note (Signed)
 Has been a chronic issue for her. Overall stable now. Follow.

## 2024-09-18 NOTE — Progress Notes (Signed)
 Subjective:    Patient ID: Angelica Robertson, female    DOB: 1946-02-21, 78 y.o.   MRN: [id number]  Patient here for  Chief Complaint  Patient presents with   Annual Exam    HPI Here for a physical exam. Seeing Dr Florencio. Is s/p carotid dopplers, calcium  score and echo. Holter unremarkable. Carotid dopplers - negative. Echo unremarkable. Calcium  score 28.  Had f/u 07/31/24 - cardiology - f/u previous syncope. No further episodes. Had f/u with physiatry 06/10/24 - recommended to continue PT, continue tylenol  - for continued neck pain. Neck - doing better. . Feeling better. States she feels more like her normal self. No chest pain or sob reported. No abdominal pain or bowel change reported.  Discussed F/u MRI brain one year - last 02/2024.    Past Medical History:  Diagnosis Date   Depression 2000   Heart murmur    Hypercholesterolemia Osteoporosis   Hyperlipemia    Hypertension    Past Surgical History:  Procedure Laterality Date   ABDOMINAL HYSTERECTOMY  12/11/1978   CATARACT EXTRACTION Right 04/2024   CATARACT EXTRACTION Left 06/2024   COLONOSCOPY WITH PROPOFOL  N/A 03/05/2018   Procedure: COLONOSCOPY WITH PROPOFOL ;  Surgeon: Toledo, Ladell POUR, MD;  Location: [facility] ENDOSCOPY;  Service: Endoscopy;  Laterality: N/A;   COLONOSCOPY WITH PROPOFOL  N/A 04/23/2023   Procedure: COLONOSCOPY WITH PROPOFOL ;  Surgeon: Maryruth Ole DASEN, MD;  Location: [facility] ENDOSCOPY;  Service: Endoscopy;  Laterality: N/A;   Family History  Problem Relation Age of Onset   Heart disease Mother    Diabetes Mother    Heart disease Father    Diabetes Father    Colon cancer Other    Congestive Heart Failure Brother    Colon cancer Brother    Mental illness Paternal Aunt    BRCA 1/2 Neg Hx    Breast cancer Neg Hx    Cowden syndrome Neg Hx    DES usage Neg Hx    Endometrial cancer Neg Hx    Li-Fraumeni syndrome Neg Hx    Ovarian cancer Neg Hx    Social History   Socioeconomic History   Marital status:  Married    Spouse name: robert   Number of children: 2   Years of education: Not on file   Highest education level: 12th grade  Occupational History   Not on file  Tobacco Use   Smoking status: Never   Smokeless tobacco: Never  Vaping Use   Vaping status: Never Used  Substance and Sexual Activity   Alcohol use: Yes    Alcohol/week: 0.0 standard drinks of alcohol    Comment: occasionally - socially during the summer a glass of wine or beer   Drug use: No   Sexual activity: Yes  Other Topics Concern   Not on file  Social History Narrative   married   Social Drivers of Corporate investment banker Strain: Low Risk  (07/22/2024)   Overall Financial Resource Strain (CARDIA)    Difficulty of Paying Living Expenses: Not hard at all  Food Insecurity: No Food Insecurity (07/22/2024)   Hunger Vital Sign    Worried About Running Out of Food in the Last Year: Never true    Ran Out of Food in the Last Year: Never true  Transportation Needs: No Transportation Needs (07/22/2024)   PRAPARE - Administrator, Civil Service (Medical): No    Lack of Transportation (Non-Medical): No  Physical Activity: Inactive (07/22/2024)   Exercise  Vital Sign    Days of Exercise per Week: 0 days    Minutes of Exercise per Session: 0 min  Stress: Stress Concern Present (07/22/2024)   [employer] of Occupational Health - Occupational Stress Questionnaire    Feeling of Stress: To some extent  Social Connections: Socially Integrated (07/22/2024)   Social Connection and Isolation Panel    Frequency of Communication with Friends and Family: Once a week    Frequency of Social Gatherings with Friends and Family: Three times a week    Attends Religious Services: More than 4 times per year    Active Member of Clubs or Organizations: Yes    Attends Banker Meetings: More than 4 times per year    Marital Status: Married     Review of Systems  Constitutional:  Negative for appetite  change and unexpected weight change.  HENT:  Negative for congestion, sinus pressure and sore throat.   Eyes:  Negative for pain and visual disturbance.  Respiratory:  Negative for cough, chest tightness and shortness of breath.   Cardiovascular:  Negative for chest pain, palpitations and leg swelling.  Gastrointestinal:  Negative for abdominal pain, diarrhea, nausea and vomiting.  Genitourinary:  Negative for difficulty urinating and dysuria.  Musculoskeletal:  Negative for joint swelling and myalgias.  Skin:  Negative for color change and rash.  Neurological:  Negative for dizziness and headaches.  Hematological:  Negative for adenopathy. Does not bruise/bleed easily.  Psychiatric/Behavioral:  Negative for agitation and dysphoric mood.        Objective:     BP 122/70   Pulse 84   Resp 16   Ht 5' 3 (1.6 m)   Wt 134 lb 3.2 oz (60.9 kg)   LMP 11/27/1979   SpO2 97%   BMI 23.77 kg/m  Wt Readings from Last 3 Encounters:  09/18/24 134 lb 3.2 oz (60.9 kg)  07/23/24 130 lb (59 kg)  05/16/24 130 lb (59 kg)    Physical Exam Vitals reviewed.  Constitutional:      General: She is not in acute distress.    Appearance: Normal appearance. She is well-developed.  HENT:     Head: Normocephalic and atraumatic.     Right Ear: External ear normal.     Left Ear: External ear normal.     Mouth/Throat:     Pharynx: No oropharyngeal exudate or posterior oropharyngeal erythema.  Eyes:     General: No scleral icterus.       Right eye: No discharge.        Left eye: No discharge.     Conjunctiva/sclera: Conjunctivae normal.  Neck:     Thyroid : No thyromegaly.  Cardiovascular:     Rate and Rhythm: Normal rate and regular rhythm.  Pulmonary:     Effort: No tachypnea, accessory muscle usage or respiratory distress.     Breath sounds: Normal breath sounds. No decreased breath sounds or wheezing.  Chest:  Breasts:    Right: No inverted nipple, mass, nipple discharge or tenderness (no  axillary adenopathy).     Left: No inverted nipple, mass, nipple discharge or tenderness (no axilarry adenopathy).  Abdominal:     General: Bowel sounds are normal.     Palpations: Abdomen is soft.     Tenderness: There is no abdominal tenderness.  Musculoskeletal:        General: No swelling or tenderness.     Cervical back: Neck supple.  Lymphadenopathy:     Cervical: No cervical  adenopathy.  Skin:    Findings: No erythema or rash.  Neurological:     Mental Status: She is alert and oriented to person, place, and time.  Psychiatric:        Mood and Affect: Mood normal.        Behavior: Behavior normal.         Outpatient Encounter Medications as of 09/18/2024  Medication Sig   [EXPIRED] Zoster Vaccine Adjuvanted [facility]) injection Inject 0.5 mLs into the muscle once for 1 dose.   calcium  carbonate (OS-CAL) 600 MG TABS tablet Take 600 mg by mouth 2 (two) times daily with a meal.   cyanocobalamin  (VITAMIN B12) 1000 MCG/ML injection Inject 1,000 mcg into the muscle every 30 (thirty) days.   escitalopram  (LEXAPRO ) 20 MG tablet Take 1 tablet (20 mg total) by mouth daily.   lamoTRIgine  (LAMICTAL ) 25 MG tablet Take 1 tablet (25 mg total) by mouth 2 (two) times daily.   losartan  (COZAAR ) 25 MG tablet Take 1/2 tablet q day   mirtazapine  (REMERON ) 15 MG tablet Take 1.5 tablets (22.5 mg total) by mouth at bedtime.   rosuvastatin  (CRESTOR ) 20 MG tablet Take 1 tablet (20 mg total) by mouth at bedtime.   VITAMIN D  PO Take by mouth daily.   [DISCONTINUED] losartan  (COZAAR ) 25 MG tablet TAKE 1 TABLET BY MOUTH ONCE DAILY   [EXPIRED] cyanocobalamin  (VITAMIN B12) injection 1,000 mcg    No facility-administered encounter medications on file as of 09/18/2024.     Lab Results  Component Value Date   WBC 10.5 12/18/2023   HGB 13.8 12/18/2023   HCT 41.2 12/18/2023   PLT 365.0 12/18/2023   GLUCOSE 107 (H) 09/16/2024   CHOL 135 09/16/2024   TRIG 117.0 09/16/2024   HDL 54.50 09/16/2024    LDLCALC 57 09/16/2024   ALT 16 09/16/2024   AST 20 09/16/2024   NA 142 09/16/2024   K 4.1 09/16/2024   CL 102 09/16/2024   CREATININE 0.81 09/16/2024   BUN 14 09/16/2024   CO2 32 09/16/2024   TSH 1.74 12/20/2023   HGBA1C 6.3 09/16/2024   MICROALBUR 0.8 09/18/2024    MM 3D SCREENING MAMMOGRAM BILATERAL BREAST Result Date: 07/04/2024 CLINICAL DATA:  Screening. EXAM: DIGITAL SCREENING BILATERAL MAMMOGRAM WITH TOMOSYNTHESIS AND CAD TECHNIQUE: Bilateral screening digital craniocaudal and mediolateral oblique mammograms were obtained. Bilateral screening digital breast tomosynthesis was performed. The images were evaluated with computer-aided detection. COMPARISON:  Previous exam(s). ACR Breast Density Category b: There are scattered areas of fibroglandular density. FINDINGS: There are no findings suspicious for malignancy. IMPRESSION: No mammographic evidence of malignancy. A result letter of this screening mammogram will be mailed directly to the patient. RECOMMENDATION: Screening mammogram in one year. (Code:SM-B-01Y) BI-RADS CATEGORY  1: Negative. Electronically Signed   By: Corean Salter M.D.   On: 07/04/2024 10:01       Assessment & Plan:  Routine general medical examination at a health care facility  Healthcare maintenance Assessment & Plan: Physical today 09/18/24.  Mammogram 06/26/23- [facility]. Diagnostic mammogram left breast 07/02/23 - [facility] I.  Recommended continuing annual screening mammogram. F/u mammogram 07/04/24 - [facility] I. Colonoscopy 03/05/18.     Type 2 diabetes mellitus with hyperglycemia, without long-term current use of insulin (HCC) Assessment & Plan: Recent A1c stable 6.3. continue exercise. Follow met b and A1c.   Orders: -     TSH; Future -     Hemoglobin A1c; Future -     Basic metabolic panel with GFR; Future -  Microalbumin / creatinine urine ratio  Syncope, unspecified syncope type Assessment & Plan: Seeing Dr Florencio. Is s/p carotid dopplers,  calcium  score and echo. Holter unremarkable. Carotid dopplers - negative. Echo unremarkable. Calcium  score 28. Last evaluated 02/01/24 - recommended to hold losartan  and her cholesterol medication. She was anxious about stopping. Elected to continue crestor . Also, agreeable to 1/2 losartan .  Blood pressure doing well. Reviewed outside readings - blood pressure averaging - 111-130/60-70s. Has had worsening depression and anxiety. Evaluated by Dr Coby 03/03/24. Recommended to add lamictal . Continues on lexapro  and mirtazapine . Continues on rexulti . Seeing neurology as well - syncope and headache. Planning for EEG - scheduled for 03/14/24. Had MRI brain 02/09/24 - No acute finding. Minimal small vessel change of the cerebral  hemispheric white matter, minimally progressive since 2019. Small region of dural thickening at the lateral right convexity with maximal thickness of 2 mm that could be the residua of a tiny subdural hematoma or could be a sessile meningioma, not present in 2019. Minimal subdural residua is favored. Recommended f/u MRI in one year. No further episodes.   Orders: -     TSH; Future  Hypercholesterolemia Assessment & Plan: Continue crestor . Follow lipid panel and liver function tests.   Orders: -     Lipid panel; Future -     Hepatic function panel; Future -     CBC with Differential/Platelet; Future  Need for shingles vaccine  B12 deficiency Assessment & Plan: Continue b12 injections.   Orders: -     Cyanocobalamin   Cervicalgia Assessment & Plan: Has been a chronic issue for her. Overall stable now. Follow.    Hypertension, unspecified type Assessment & Plan: On losartan  1/2 tablet. Blood pressure doing well. Reviewed outside readings - 110-130s/60s (averaging). No changes in medication. Follow pressures.  Follow metabolic panel.    Major depressive disorder, recurrent, severe without psychotic features [facility]) Assessment & Plan: Followed by Dr Coby.  Continues on  lamictal  and remeron . Continues on rexulti . Doing better. Feels better.  Continue f/u with psychiatry.     Immunization due -     Shingrix; Inject 0.5 mLs into the muscle once for 1 dose.  Dispense: 0.5 mL; Refill: 0 -     Flu vaccine HIGH DOSE PF(Fluzone Trivalent)  Severe episode of recurrent major depressive disorder, without psychotic features [facility]: [facility]) Assessment & Plan: Is followed by psychiatry. Doing well on current regimen.  Per review, on lamictal , remeron  and lexapro .    Other orders -     Losartan  Potassium; Take 1/2 tablet q day  Dispense: 45 tablet; Refill: 3     Allena Hamilton, MD

## 2024-09-18 NOTE — Assessment & Plan Note (Signed)
 Recent A1c stable 6.3. continue exercise. Follow met b and A1c.

## 2024-09-18 NOTE — Assessment & Plan Note (Signed)
 On losartan  1/2 tablet. Blood pressure doing well. Reviewed outside readings - 110-130s/60s (averaging). No changes in medication. Follow pressures.  Follow metabolic panel.

## 2024-09-18 NOTE — Assessment & Plan Note (Signed)
Continue b12 injections.  

## 2024-09-18 NOTE — Progress Notes (Signed)
 Pharmacy Quality Measure Review  This patient is appearing on a report for being at risk of failing the adherence measure for hypertension (ACEi/ARB) medications this calendar year.   Medication: losartan  25 mg Last fill date: 03/05/24 for 90 day supply  Insurance report was not up to date. No action needed at this time.  Medication has been refilled as of 09/18/24 x90ds.  Sig has been updated to reflect patient taking half tablet (#45/90d) 3 additional refills remaining. Next refill due 2026.

## 2024-09-18 NOTE — Assessment & Plan Note (Signed)
 Continue crestor. Follow lipid panel and liver function tests.

## 2024-09-19 ENCOUNTER — Ambulatory Visit: Payer: Self-pay | Admitting: Internal Medicine

## 2024-09-21 ENCOUNTER — Encounter: Payer: Self-pay | Admitting: Internal Medicine

## 2024-09-21 NOTE — Assessment & Plan Note (Signed)
 Seeing Dr Florencio. Is s/p carotid dopplers, calcium  score and echo. Holter unremarkable. Carotid dopplers - negative. Echo unremarkable. Calcium  score 28. Last evaluated 02/01/24 - recommended to hold losartan  and her cholesterol medication. She was anxious about stopping. Elected to continue crestor . Also, agreeable to 1/2 losartan .  Blood pressure doing well. Reviewed outside readings - blood pressure averaging - 111-130/60-70s. Has had worsening depression and anxiety. Evaluated by Dr Coby 03/03/24. Recommended to add lamictal . Continues on lexapro  and mirtazapine . Continues on rexulti . Seeing neurology as well - syncope and headache. Planning for EEG - scheduled for 03/14/24. Had MRI brain 02/09/24 - No acute finding. Minimal small vessel change of the cerebral  hemispheric white matter, minimally progressive since 2019. Small region of dural thickening at the lateral right convexity with maximal thickness of 2 mm that could be the residua of a tiny subdural hematoma or could be a sessile meningioma, not present in 2019. Minimal subdural residua is favored. Recommended f/u MRI in one year. No further episodes.

## 2024-09-21 NOTE — Assessment & Plan Note (Signed)
 Is followed by psychiatry. Doing well on current regimen.  Per review, on lamictal , remeron  and lexapro .

## 2024-10-01 DIAGNOSIS — Z8249 Family history of ischemic heart disease and other diseases of the circulatory system: Secondary | ICD-10-CM | POA: Diagnosis not present

## 2024-10-01 DIAGNOSIS — E11620 Type 2 diabetes mellitus with diabetic dermatitis: Secondary | ICD-10-CM | POA: Diagnosis not present

## 2024-10-01 DIAGNOSIS — Z833 Family history of diabetes mellitus: Secondary | ICD-10-CM | POA: Diagnosis not present

## 2024-10-01 DIAGNOSIS — N182 Chronic kidney disease, stage 2 (mild): Secondary | ICD-10-CM | POA: Diagnosis not present

## 2024-10-01 DIAGNOSIS — F325 Major depressive disorder, single episode, in full remission: Secondary | ICD-10-CM | POA: Diagnosis not present

## 2024-10-01 DIAGNOSIS — K219 Gastro-esophageal reflux disease without esophagitis: Secondary | ICD-10-CM | POA: Diagnosis not present

## 2024-10-01 DIAGNOSIS — E1122 Type 2 diabetes mellitus with diabetic chronic kidney disease: Secondary | ICD-10-CM | POA: Diagnosis not present

## 2024-10-01 DIAGNOSIS — E1165 Type 2 diabetes mellitus with hyperglycemia: Secondary | ICD-10-CM | POA: Diagnosis not present

## 2024-10-01 DIAGNOSIS — I129 Hypertensive chronic kidney disease with stage 1 through stage 4 chronic kidney disease, or unspecified chronic kidney disease: Secondary | ICD-10-CM | POA: Diagnosis not present

## 2024-10-08 ENCOUNTER — Telehealth: Payer: Self-pay | Admitting: *Deleted

## 2024-10-08 DIAGNOSIS — F330 Major depressive disorder, recurrent, mild: Secondary | ICD-10-CM | POA: Diagnosis not present

## 2024-10-08 DIAGNOSIS — F4323 Adjustment disorder with mixed anxiety and depressed mood: Secondary | ICD-10-CM | POA: Diagnosis not present

## 2024-10-08 NOTE — Telephone Encounter (Signed)
 Please clarify. She should not need a prescription for shingrix.  Also, I am not sure what is meant by 2 refills.

## 2024-10-08 NOTE — Telephone Encounter (Unsigned)
 Copied from CRM [id number]. Topic: Clinical - Prescription Issue >> Oct 08, 2024 11:54 AM Laymon HERO wrote: Reason for CRM: Pharmacy calling- needing to get the prescription for Shingrix to be fixed as saying 2 refills-

## 2024-10-09 MED ORDER — ZOSTER VAC RECOMB ADJUVANTED 50 MCG/0.5ML IM SUSR: 0.5000 mL | Freq: Once | INTRAMUSCULAR | 1 refills | Status: AC

## 2024-10-09 NOTE — Telephone Encounter (Unsigned)
 Copied from CRM [id number]. Topic: Clinical - Medication Question >> Oct 09, 2024  2:45 PM Aisha D wrote: Reason for CRM: Pharmacy calling- needing to get the prescription for Shingrix to be fixed as saying 2 refills-  UPDATE: Pharmacy is calling back to check the status of the medication refill request for the Shingrix. Pharmacy stated that they are required to have a prescription for this medication and that it needs to have an additional refill for the the second dose.

## 2024-10-09 NOTE — Telephone Encounter (Signed)
 Rx sent in for shingles vaccine x 1 and repeat x 1 in 2-6 months.

## 2024-10-09 NOTE — Telephone Encounter (Signed)
 Noted

## 2024-10-10 ENCOUNTER — Encounter: Payer: Self-pay | Admitting: Internal Medicine

## 2024-10-10 DIAGNOSIS — Z23 Encounter for immunization: Secondary | ICD-10-CM

## 2024-10-10 MED ORDER — SHINGRIX 50 MCG/0.5ML IM SUSR: 0.5000 mL | Freq: Once | INTRAMUSCULAR | 0 refills | Status: AC

## 2024-10-10 NOTE — Telephone Encounter (Signed)
 After clarifying this was sent in yesterday. Please call pharmacy and see what is needed. See if can give verbal correction.

## 2024-10-20 ENCOUNTER — Ambulatory Visit

## 2024-10-20 ENCOUNTER — Other Ambulatory Visit: Payer: Self-pay | Admitting: Internal Medicine

## 2024-10-20 DIAGNOSIS — E538 Deficiency of other specified B group vitamins: Secondary | ICD-10-CM | POA: Diagnosis not present

## 2024-10-20 MED ORDER — CYANOCOBALAMIN 1000 MCG/ML IJ SOLN
1000.0000 ug | Freq: Once | INTRAMUSCULAR | Status: AC
  Administered 2024-10-20: 1000 ug via INTRAMUSCULAR

## 2024-10-20 NOTE — Progress Notes (Signed)
 Patient was administered a B12 injection into her right deltoid. Patient tolerated the B12 injection well.Patient requested that I stick the right arm due to a couple of days ago she had another vaccine and her arm is still really sore.

## 2024-10-30 ENCOUNTER — Encounter: Payer: Self-pay | Admitting: Pharmacist

## 2024-10-30 NOTE — Progress Notes (Signed)
 Pharmacy Quality Measure Review  This patient is appearing on a report for being at risk of failing the adherence measure for cholesterol (statin) medications this calendar year.   Medication: rosuvastatin  20 mg Last fill date: 07/21/24 for 90 day supply  Insurance report was not up to date. No action needed at this time.  Medication has been refilled as of 10/20/24 x90ds

## 2024-11-10 ENCOUNTER — Other Ambulatory Visit: Payer: Self-pay | Admitting: Psychiatry

## 2024-11-10 DIAGNOSIS — F411 Generalized anxiety disorder: Secondary | ICD-10-CM

## 2024-11-18 DIAGNOSIS — F411 Generalized anxiety disorder: Secondary | ICD-10-CM | POA: Diagnosis not present

## 2024-11-18 DIAGNOSIS — F3342 Major depressive disorder, recurrent, in full remission: Secondary | ICD-10-CM | POA: Diagnosis not present

## 2024-11-19 ENCOUNTER — Ambulatory Visit (INDEPENDENT_AMBULATORY_CARE_PROVIDER_SITE_OTHER)

## 2024-11-19 DIAGNOSIS — E538 Deficiency of other specified B group vitamins: Secondary | ICD-10-CM | POA: Diagnosis not present

## 2024-11-19 MED ORDER — CYANOCOBALAMIN 1000 MCG/ML IJ SOLN
1000.0000 ug | Freq: Once | INTRAMUSCULAR | Status: AC
  Administered 2024-11-19: 1000 ug via INTRAMUSCULAR

## 2024-11-19 NOTE — Progress Notes (Signed)
 Pt presented for their vitamin B12 injection. Pt was identified through two identifiers. Pt tolerated shot well in their left deltoid.

## 2024-12-22 ENCOUNTER — Ambulatory Visit

## 2024-12-22 DIAGNOSIS — E538 Deficiency of other specified B group vitamins: Secondary | ICD-10-CM | POA: Diagnosis not present

## 2024-12-22 MED ORDER — CYANOCOBALAMIN 1000 MCG/ML IJ SOLN
1000.0000 ug | Freq: Once | INTRAMUSCULAR | Status: AC
  Administered 2024-12-22: 1000 ug via INTRAMUSCULAR

## 2024-12-22 NOTE — Progress Notes (Signed)
 Pt presented for their vitamin B12 injection. Pt was identified through two identifiers. Pt tolerated shot well in their right deltoid.

## 2024-12-24 ENCOUNTER — Telehealth (INDEPENDENT_AMBULATORY_CARE_PROVIDER_SITE_OTHER): Admitting: Psychiatry

## 2024-12-24 ENCOUNTER — Encounter: Payer: Self-pay | Admitting: Psychiatry

## 2024-12-24 DIAGNOSIS — F3342 Major depressive disorder, recurrent, in full remission: Secondary | ICD-10-CM

## 2024-12-24 DIAGNOSIS — F411 Generalized anxiety disorder: Secondary | ICD-10-CM

## 2024-12-24 NOTE — Progress Notes (Signed)
 Virtual Visit via Video Note  I connected with Angelica Robertson on 12/24/2024 at 11:30 AM EST by a video enabled telemedicine application and verified that I am speaking with the correct person using two identifiers.  Location Provider Location : [facility] Patient Location : Home  Participants: Patient , Provider    I discussed the limitations of evaluation and management by telemedicine and the availability of in person appointments. The patient expressed understanding and agreed to proceed   I discussed the assessment and treatment plan with the patient. The patient was provided an opportunity to ask questions and all were answered. The patient agreed with the plan and demonstrated an understanding of the instructions.   The patient was advised to call back or seek an in-person evaluation if the symptoms worsen or if the condition fails to improve as anticipated. BH MD OP Progress Note  12/24/2024 11:48 AM Angelica Robertson  MRN:  [id number]  Chief Complaint:  Chief Complaint  Patient presents with   Medication Refill   Follow-up   Anxiety   Depression   Discussed the use of AI scribe software for clinical note transcription with the patient, who gave verbal consent to proceed.  History of Present Illness Angelica Robertson is a 79 year old Caucasian female, married, retired, lives in [place], has a history of MDD, GAD, hyperlipidemia, arthritis was evaluated by telemedicine today for a follow-up appointment.  Since her last visit in October, she reports continued stability and well-being. Remaining very busy, she reports that she is still doing all her usual activities including attending church, playing cards, going to the gym, cooking and reading.  She denies significant sadness, depression symptoms, or anxiety attacks since the last visit. She reports that she continues to sleep satisfactorily. She denies suicidal thoughts.  She reports that her current medication regimen includes mirtazapine   22.5 mg at bedtime, escitalopram  (Lexapro ) 20 mg daily, and lamotrigine  25 mg twice daily, and she notes no recent changes.  She denies any side effects to medications.  She denies any homicidality or perceptual disturbances.  She denies any recent syncopal episodes and reports she had recent visit with neurology.   Visit Diagnosis:    ICD-10-CM   1. Recurrent major depressive disorder, in full remission  F33.42     2. GAD (generalized anxiety disorder)  F41.1       Past Psychiatric History: I have reviewed past psychiatric history from progress note on 01/12/2020.  Previously attempted TMS-07/17/2022-did not tolerate.  Past trials of medications-duloxetine , Zoloft , Wellbutrin , Klonopin , Seroquel, trazodone , zolpidem .  Inpatient behavioral health admission-multiple in the past.  5 recent old Angelica Robertson - 09/30/2023.  Past Medical History:  Past Medical History:  Diagnosis Date   Depression 2000   Heart murmur    Hypercholesterolemia Osteoporosis   Hyperlipemia    Hypertension     Past Surgical History:  Procedure Laterality Date   ABDOMINAL HYSTERECTOMY  12/11/1978   CATARACT EXTRACTION Right 04/2024   CATARACT EXTRACTION Left 06/2024   COLONOSCOPY WITH PROPOFOL  N/A 03/05/2018   Procedure: COLONOSCOPY WITH PROPOFOL ;  Surgeon: Toledo, Ladell POUR, MD;  Location: [facility] ENDOSCOPY;  Service: Endoscopy;  Laterality: N/A;   COLONOSCOPY WITH PROPOFOL  N/A 04/23/2023   Procedure: COLONOSCOPY WITH PROPOFOL ;  Surgeon: Maryruth Ole DASEN, MD;  Location: [facility] ENDOSCOPY;  Service: Endoscopy;  Laterality: N/A;    Family Psychiatric History: I have reviewed family psychiatric history from progress note on 01/12/2020.  Family History:  Family History  Problem Relation Age of Onset  Heart disease Mother    Diabetes Mother    Heart disease Father    Diabetes Father    Colon cancer Other    Congestive Heart Failure Brother    Colon cancer Brother    Mental illness Paternal  Aunt    BRCA 1/2 Neg Hx    Breast cancer Neg Hx    Cowden syndrome Neg Hx    DES usage Neg Hx    Endometrial cancer Neg Hx    Li-Fraumeni syndrome Neg Hx    Ovarian cancer Neg Hx     Social History: I have reviewed social history from progress note on 01/12/2020. Social History   Socioeconomic History   Marital status: Married    Spouse name: robert   Number of children: 2   Years of education: Not on file   Highest education level: 12th grade  Occupational History   Not on file  Tobacco Use   Smoking status: Never   Smokeless tobacco: Never  Vaping Use   Vaping status: Never Used  Substance and Sexual Activity   Alcohol use: Yes    Alcohol/week: 0.0 standard drinks of alcohol    Comment: occasionally - socially during the summer a glass of wine or beer   Drug use: No   Sexual activity: Yes  Other Topics Concern   Not on file  Social History Narrative   married   Social Drivers of Health   Tobacco Use: Low Risk (12/24/2024)   Patient History    Smoking Tobacco Use: Never    Smokeless Tobacco Use: Never    Passive Exposure: Not on file  Financial Resource Strain: Low Risk (07/22/2024)   Overall Financial Resource Strain (CARDIA)    Difficulty of Paying Living Expenses: Not hard at all  Food Insecurity: No Food Insecurity (07/22/2024)   Epic    Worried About Radiation Protection Practitioner of Food in the Last Year: Never true    Ran Out of Food in the Last Year: Never true  Transportation Needs: No Transportation Needs (07/22/2024)   Epic    Lack of Transportation (Medical): No    Lack of Transportation (Non-Medical): No  Physical Activity: Inactive (07/22/2024)   Exercise Vital Sign    Days of Exercise per Week: 0 days    Minutes of Exercise per Session: 0 min  Stress: Stress Concern Present (07/22/2024)   [employer] of Occupational Health - Occupational Stress Questionnaire    Feeling of Stress: To some extent  Social Connections: Socially Integrated (07/22/2024)   Social  Connection and Isolation Panel    Frequency of Communication with Friends and Family: Once a week    Frequency of Social Gatherings with Friends and Family: Three times a week    Attends Religious Services: More than 4 times per year    Active Member of Clubs or Organizations: Yes    Attends Banker Meetings: More than 4 times per year    Marital Status: Married  Depression (PHQ2-9): Low Risk (12/24/2024)   Depression (PHQ2-9)    PHQ-2 Score: 0  Alcohol Screen: Low Risk (07/22/2024)   Alcohol Screen    Last Alcohol Screening Score (AUDIT): 0  Housing: Low Risk (07/22/2024)   Epic    Unable to Pay for Housing in the Last Year: No    Number of Times Moved in the Last Year: 0    Homeless in the Last Year: No  Utilities: Not At Risk (07/23/2024)   Epic    Threatened with  loss of utilities: No  Health Literacy: Adequate Health Literacy (07/23/2024)   B1300 Health Literacy    Frequency of need for help with medical instructions: Never    Allergies: Allergies[1]  Metabolic Disorder Labs: Lab Results  Component Value Date   HGBA1C 6.3 09/16/2024   No results found for: PROLACTIN Lab Results  Component Value Date   CHOL 135 09/16/2024   TRIG 117.0 09/16/2024   HDL 54.50 09/16/2024   CHOLHDL 2 09/16/2024   VLDL 23.4 09/16/2024   LDLCALC 57 09/16/2024   LDLCALC 147 (H) 05/09/2024   Lab Results  Component Value Date   TSH 1.74 12/20/2023   TSH 1.860 08/10/2023    Therapeutic Level Labs: No results found for: LITHIUM No results found for: VALPROATE No results found for: CBMZ  Current Medications: Current Outpatient Medications  Medication Sig Dispense Refill   calcium  carbonate (OS-CAL) 600 MG TABS tablet Take 600 mg by mouth 2 (two) times daily with a meal.     cyanocobalamin  (VITAMIN B12) 1000 MCG/ML injection Inject 1,000 mcg into the muscle every 30 (thirty) days.     escitalopram  (LEXAPRO ) 20 MG tablet TAKE 1 TABLET BY MOUTH ONCE DAILY 90 tablet 3    lamoTRIgine  (LAMICTAL ) 25 MG tablet Take 1 tablet (25 mg total) by mouth 2 (two) times daily. 180 tablet 1   losartan  (COZAAR ) 25 MG tablet Take 1/2 tablet q day 45 tablet 3   mirtazapine  (REMERON ) 15 MG tablet Take 1.5 tablets (22.5 mg total) by mouth at bedtime. 135 tablet 1   rosuvastatin  (CRESTOR ) 20 MG tablet TAKE 1 TABLET BY MOUTH AT BEDTIME 90 tablet 3   VITAMIN D  PO Take by mouth daily.     No current facility-administered medications for this visit.     Musculoskeletal: Strength & Muscle Tone: UTA Gait & Station: Seated Patient leans: N/A  Psychiatric Specialty Exam: Review of Systems  Psychiatric/Behavioral: Negative.      Last menstrual period 11/27/1979.There is no height or weight on file to calculate BMI.  General Appearance: Casual  Eye Contact:  Fair  Speech:  Clear and Coherent  Volume:  Normal  Mood:  Euthymic  Affect:  Congruent  Thought Process:  Goal Directed and Descriptions of Associations: Intact  Orientation:  Full (Time, Place, and Person)  Thought Content: Logical   Suicidal Thoughts:  No  Homicidal Thoughts:  No  Memory:  Immediate;   Fair Recent;   Fair Remote;   Fair  Judgement:  Fair  Insight:  Fair  Psychomotor Activity:  Normal  Concentration:  Concentration: Fair and Attention Span: Fair  Recall:  [employer] of Knowledge: Fair  Language: Fair  Akathisia:  No  Handed:  Right  AIMS (if indicated): not done  Assets:  Communication Skills Desire for Improvement Housing Social Support  ADL's:  Intact  Cognition: WNL  Sleep:  Fair   Screenings: AIMS    Flowsheet Row Office Visit from 10/02/2023 in [facility] Office Visit from 08/28/2023 in [facility] Office Visit from 06/20/2022 in [facility] Office Visit from 04/24/2022 in [facility]  AIMS Total Score 0 0 0 0    AUDIT    Flowsheet Row Admission (Discharged) from 05/12/2022 in [facility] Admission (Discharged) from 12/17/2019 in [facility] INPATIENT BEHAVIORAL MEDICINE Admission (Discharged) [facility]  Alcohol Use Disorder  Identification Test Final Score (AUDIT) 0 0 0   GAD-7    Flowsheet Row Office Visit from 09/10/2024 in [facility] Office Visit from 03/14/2024 in [facility] Visit from 01/03/2024 in [facility] Video Visit from 11/26/2023 in [facility] Video Visit from 10/19/2023 in [facility]  Total GAD-7 Score 0 0 3 3 9    Mini-Mental    Flowsheet Row Clinical Support from 02/27/2018 in [facility] Clinical Support from 02/02/2017 in [facility] [employer] Clinical Support from 02/03/2016 in [facility] [employer]  Total Score (max 30 points ) 30 30 30    PHQ2-9    Flowsheet Row Video Visit from 12/24/2024 in [facility] Office Visit from 09/10/2024 in [facility] Clinical Support from 07/23/2024 in [facility] Video Visit from 04/15/2024 in [facility] Office Visit from 03/14/2024 in [facility] Total Score 0 0 0 2 0  PHQ-9 Total Score -- -- 0 3 0   Flowsheet Row Video Visit from 12/24/2024 in [facility] Office Visit from 09/10/2024 in [facility] Video Visit from 06/09/2024 in [facility]   C-SSRS RISK CATEGORY No Risk No Risk No Risk     Assessment and Plan: Angelica Robertson is a 79 year old Caucasian female who presented for a follow-up appointment, discussed assessment and plan as noted below.  1. Recurrent major depressive disorder, in full remission Currently denies any significant depression symptoms Continue Lamotrigine  25 mg twice daily Continue Mirtazapine  22.5 mg daily at bedtime Continue Lexapro  20 mg daily  2. GAD (generalized anxiety disorder)-stable Currently reports anxiety symptoms is well-managed Continue Lexapro  20 mg daily Continue psychotherapy sessions with Ms. Delon at [facility] as needed  Discussed tapering off of antidepressants to monotherapy in the future.  Will reevaluate in future sessions.  Follow-up Follow-up in clinic in 3 months or sooner in person.    Collaboration of Care: Collaboration of Care: Primary Care Provider AEB patient encouraged to follow-up with primary care provider for current flulike symptoms.  Patient/Guardian was advised Release of Information must be obtained prior to any record release in order to collaborate their care with an outside provider. Patient/Guardian was advised if they have not already done so to contact the registration department to sign all necessary forms in order for us  to release information regarding their care.   Consent: Patient/Guardian gives verbal consent for treatment and assignment of benefits for services provided during this visit. Patient/Guardian expressed understanding and agreed to proceed.  This note was generated in part or whole with voice recognition software. Voice recognition is usually quite accurate but there are transcription errors that can and very often do occur. I apologize for any typographical errors that were not detected and corrected.     Forest Redwine, MD 12/24/2024, 3:50 PM     [1] No Known Allergies

## 2025-01-19 ENCOUNTER — Other Ambulatory Visit

## 2025-01-22 ENCOUNTER — Ambulatory Visit: Admitting: Internal Medicine

## 2025-07-28 ENCOUNTER — Ambulatory Visit
